# Patient Record
Sex: Male | Born: 1945
Health system: Southern US, Community
[De-identification: ages and names within clinical notes are randomized; demographics above are authoritative.]

## PROBLEM LIST (undated history)

## (undated) DIAGNOSIS — F419 Anxiety disorder, unspecified: Secondary | ICD-10-CM

## (undated) DIAGNOSIS — I10 Essential (primary) hypertension: Secondary | ICD-10-CM

## (undated) DIAGNOSIS — K279 Peptic ulcer, site unspecified, unspecified as acute or chronic, without hemorrhage or perforation: Secondary | ICD-10-CM

## (undated) DIAGNOSIS — J449 Chronic obstructive pulmonary disease, unspecified: Secondary | ICD-10-CM

## (undated) DIAGNOSIS — E785 Hyperlipidemia, unspecified: Secondary | ICD-10-CM

## (undated) DIAGNOSIS — R569 Unspecified convulsions: Secondary | ICD-10-CM

## (undated) DIAGNOSIS — I639 Cerebral infarction, unspecified: Secondary | ICD-10-CM

## (undated) DIAGNOSIS — I6529 Occlusion and stenosis of unspecified carotid artery: Secondary | ICD-10-CM

## (undated) DIAGNOSIS — K219 Gastro-esophageal reflux disease without esophagitis: Secondary | ICD-10-CM

## (undated) DIAGNOSIS — G8929 Other chronic pain: Secondary | ICD-10-CM

## (undated) HISTORY — DX: Essential (primary) hypertension: I10

## (undated) HISTORY — DX: Occlusion and stenosis of unspecified carotid artery: I65.29

## (undated) HISTORY — DX: Hyperlipidemia, unspecified: E78.5

## (undated) HISTORY — PX: UPPER GASTROINTESTINAL ENDOSCOPY: SHX188

## (undated) HISTORY — PX: COLONOSCOPY: SHX174

## (undated) HISTORY — DX: Other chronic pain: G89.29

## (undated) HISTORY — DX: Unspecified convulsions: R56.9

## (undated) HISTORY — PX: LUMBAR DISC SURGERY: SHX700

## (undated) HISTORY — DX: Anxiety disorder, unspecified: F41.9

## (undated) HISTORY — DX: Peptic ulcer, site unspecified, unspecified as acute or chronic, without hemorrhage or perforation: K27.9

## (undated) HISTORY — PX: BACK SURGERY: SHX140

---

## 2000-07-03 ENCOUNTER — Encounter: Payer: Self-pay | Admitting: Internal Medicine

## 2000-07-03 ENCOUNTER — Encounter: Admission: RE | Admit: 2000-07-03 | Discharge: 2000-07-03 | Payer: Self-pay | Admitting: Internal Medicine

## 2004-03-05 ENCOUNTER — Ambulatory Visit (HOSPITAL_COMMUNITY): Admission: RE | Admit: 2004-03-05 | Discharge: 2004-03-05 | Payer: Self-pay | Admitting: Orthopedic Surgery

## 2005-03-31 ENCOUNTER — Emergency Department (HOSPITAL_COMMUNITY): Admission: EM | Admit: 2005-03-31 | Discharge: 2005-03-31 | Payer: Self-pay | Admitting: Emergency Medicine

## 2010-02-12 ENCOUNTER — Encounter: Payer: Self-pay | Admitting: Internal Medicine

## 2013-05-18 ENCOUNTER — Ambulatory Visit
Admission: RE | Admit: 2013-05-18 | Discharge: 2013-05-18 | Disposition: A | Discharge status: Home / Self Care | Payer: Medicare Other | Source: Ambulatory Visit | Attending: Internal Medicine | Admitting: Internal Medicine

## 2013-05-18 ENCOUNTER — Other Ambulatory Visit: Payer: Self-pay | Admitting: Internal Medicine

## 2013-05-18 DIAGNOSIS — R059 Cough, unspecified: Secondary | ICD-10-CM

## 2013-05-18 DIAGNOSIS — R05 Cough: Secondary | ICD-10-CM

## 2019-05-19 ENCOUNTER — Ambulatory Visit (INDEPENDENT_AMBULATORY_CARE_PROVIDER_SITE_OTHER): Payer: Medicare HMO | Admitting: Family Medicine

## 2019-05-19 ENCOUNTER — Encounter: Payer: Self-pay | Admitting: Family Medicine

## 2019-05-19 ENCOUNTER — Other Ambulatory Visit: Payer: Self-pay

## 2019-05-19 VITALS — BP 113/64 | HR 86 | Temp 97.8°F | Ht 69.0 in | Wt 187.6 lb

## 2019-05-19 DIAGNOSIS — F410 Panic disorder [episodic paroxysmal anxiety] without agoraphobia: Secondary | ICD-10-CM

## 2019-05-19 DIAGNOSIS — R350 Frequency of micturition: Secondary | ICD-10-CM | POA: Diagnosis not present

## 2019-05-19 DIAGNOSIS — Z720 Tobacco use: Secondary | ICD-10-CM

## 2019-05-19 DIAGNOSIS — F5104 Psychophysiologic insomnia: Secondary | ICD-10-CM

## 2019-05-19 DIAGNOSIS — F411 Generalized anxiety disorder: Secondary | ICD-10-CM | POA: Diagnosis not present

## 2019-05-19 DIAGNOSIS — Z7689 Persons encountering health services in other specified circumstances: Secondary | ICD-10-CM

## 2019-05-19 DIAGNOSIS — Z79899 Other long term (current) drug therapy: Secondary | ICD-10-CM | POA: Diagnosis not present

## 2019-05-19 LAB — URINALYSIS
Bilirubin, UA: NEGATIVE
Glucose, UA: NEGATIVE
Ketones, UA: NEGATIVE
Leukocytes,UA: NEGATIVE
Nitrite, UA: NEGATIVE
Protein,UA: NEGATIVE
RBC, UA: NEGATIVE
Specific Gravity, UA: 1.015 (ref 1.005–1.030)
Urobilinogen, Ur: 0.2 mg/dL (ref 0.2–1.0)
pH, UA: 6.5 (ref 5.0–7.5)

## 2019-05-19 MED ORDER — MIRTAZAPINE 7.5 MG PO TABS: 7.5000 mg | ORAL_TABLET | Freq: Every day | ORAL | 0 refills | Status: DC

## 2019-05-19 NOTE — Progress Notes (Signed)
Subjective: JS:[id number] care, generalized anxiety disorder, tobacco use disorder, hypertension, hyperlipidemia HPI: Eric Cook is a 74 y.o. male presenting to clinic today for:  1.  Hypertension with hyperlipidemia Patient reports compliance with simvastatin 20 mg daily, lisinopril 10-25 mg daily.  He has had some right-sided rib pain but he notes that this has been ongoing for only about a week and this occurred after he slipped and fell onto the corner of something at home.  Pain is only present when he is at rest and is improved with ambulation.  No hemoptysis.  He is a daily smoker and smokes anywhere from 1/2 to 3/4 pack/day and has done so for 30 years.  Denies any unplanned weight loss.  No change in exercise tolerance.  He notes he is very physically active and "has to work".  He was on Chantix several months ago which did help him quit successfully and he was off of cigarettes for two whole weeks.  Unfortunately was unable to get a refill on the medication.  He would be willing to go back on this.  2.  Generalized anxiety disorder Patient reports longstanding history of generalized anxiety disorder.  He notes that he is only ever been treated with Xanax, which she takes 1 mg 3 times daily of.  He denies any excessive daytime sedation, mental status changes including memory loss, falls, alcohol use.  3.  Urinary frequency Patient reports nocturia anywhere between three and four times per night.  He denies any hematuria.  Again no unplanned weight loss or night sweats.  He has had a yearly prostate exam/DRE but has never had a PSA per his report.  He would like to have this checked if possible.  Denies any dysuria, nausea or vomiting.  Past Medical History:  Diagnosis Date  . Anxiety   . Hyperlipidemia   . Hypertension    Past Surgical History:  Procedure Laterality Date  . BACK SURGERY     disc removal   Social History   Socioeconomic History  . Marital status: Single     Spouse name: Not on file  . Number of children: Not on file  . Years of education: Not on file  . Highest education level: Not on file  Occupational History  . Occupation: Film/video editor    Comment: part time  Tobacco Use  . Smoking status: Current Every Day Smoker    Packs/day: 0.50    Years: 30.00    Pack years: 15.00  . Smokeless tobacco: Never Used  Substance and Sexual Activity  . Alcohol use: Never  . Drug use: Never  . Sexual activity: Never  Other Topics Concern  . Not on file  Social History Narrative   Works on    Investment banker, operational of Radio broadcast assistant Strain:   . Difficulty of Paying Living Expenses:   Food Insecurity:   . Worried About Charity fundraiser in the Last Year:   . Arboriculturist in the Last Year:   Transportation Needs:   . Film/video editor (Medical):   Marland Kitchen Lack of Transportation (Non-Medical):   Physical Activity:   . Days of Exercise per Week:   . Minutes of Exercise per Session:   Stress:   . Feeling of Stress :   Social Connections:   . Frequency of Communication with Friends and Family:   . Frequency of Social Gatherings with Friends and Family:   . Attends Religious Services:   .  Active Member of Clubs or Organizations:   . Attends Archivist Meetings:   Marland Kitchen Marital Status:   Intimate Partner Violence:   . Fear of Current or Ex-Partner:   . Emotionally Abused:   Marland Kitchen Physically Abused:   . Sexually Abused:    Current Meds  Medication Sig  . ALPRAZolam (XANAX) 1 MG tablet Take 1 mg by mouth 3 (three) times daily as needed.   . cimetidine (TAGAMET) 400 MG tablet Take 400 mg by mouth 2 (two) times daily.   Marland Kitchen lisinopril-hydrochlorothiazide (ZESTORETIC) 10-12.5 MG tablet Take 1 tablet by mouth daily.  . simvastatin (ZOCOR) 20 MG tablet Take 20 mg by mouth daily at 6 PM.    History reviewed. No pertinent family history. No Known Allergies   ROS: Per HPI  Objective: Office vital signs reviewed. BP  113/64   Pulse 86   Temp 97.8 F (36.6 C)   Ht '5\' 9"'$  (1.753 m)   Wt 187 lb 9.6 oz (85.1 kg)   SpO2 95%   BMI 27.70 kg/m   Physical Examination:  General: Awake, alert, No acute distress HEENT: Normal    Eyes: PERRLA, extraocular movement in tact, sclera white Cardio: regular rate and rhythm, S1S2 heard, no murmurs appreciated Pulm: expiratory rhonchi noted R>L.  Normal air movement.  normal work of breathing on room air Extremities: warm, well perfused, No edema, cyanosis or clubbing; +2 pulses bilaterally MSK: normal gait and station Skin: dry; intact; no rashes or lesions Neuro: no tremor.  Patient's speech seems slightly slurred Psych: mood stable. Patient behavior somewhat unusual (sporadically inappropriate during conversation, talking about "having a lot of sex", etc) Depression screen [facility] 2/9 05/19/2019  Decreased Interest 0  Down, Depressed, Hopeless 0  PHQ - 2 Score 0   GAD 7 : Generalized Anxiety Score 05/19/2019  Nervous, Anxious, on Edge 1  Control/stop worrying 3  Worry too much - different things 3  Trouble relaxing 3  Restless 3  Easily annoyed or irritable 3  Afraid - awful might happen 3  Total GAD 7 Score 19  Anxiety Difficulty Not difficult at all    Assessment/ Plan: 74 y.o. male   1. Long term prescription benzodiazepine use Does not need refills.  I will plan to taper him off of this medication as his behavior was somewhat unusual.  I have starting mirtazapine as below. - ToxASSURE Select 13 (MW), Urine  2. Establishing care with new doctor, encounter for  3. Generalized anxiety disorder with panic attacks Not controlled despite 3 times daily use of benzodiazepine.  Start mirtazapine.  Follow-up in 4 weeks - ToxASSURE Select 13 (MW), Urine - mirtazapine (REMERON) 7.5 MG tablet; Take 1 tablet (7.5 mg total) by mouth at bedtime.  Dispense: 90 tablet; Refill: 0  4. Urinary frequency ?  Prostate issue.  Plan for Flomax or similar pending labs -  CMP14+EGFR - PSA - CBC - Urinalysis  5. Psychophysiological insomnia - CMP14+EGFR - mirtazapine (REMERON) 7.5 MG tablet; Take 1 tablet (7.5 mg total) by mouth at bedtime.  Dispense: 90 tablet; Refill: 0  6. Tobacco use Advised for cessation.  We will plan for Chantix pending labs.   Janora Norlander, DO [place] [phone]

## 2019-05-20 ENCOUNTER — Other Ambulatory Visit: Payer: Self-pay | Admitting: Family Medicine

## 2019-05-20 DIAGNOSIS — R350 Frequency of micturition: Secondary | ICD-10-CM

## 2019-05-20 DIAGNOSIS — Z720 Tobacco use: Secondary | ICD-10-CM

## 2019-05-20 LAB — CBC
Hematocrit: 46.3 % (ref 37.5–51.0)
Hemoglobin: 15.7 g/dL (ref 13.0–17.7)
MCH: 33.1 pg — ABNORMAL HIGH (ref 26.6–33.0)
MCHC: 33.9 g/dL (ref 31.5–35.7)
MCV: 98 fL — ABNORMAL HIGH (ref 79–97)
Platelets: 172 10*3/uL (ref 150–450)
RBC: 4.75 x10E6/uL (ref 4.14–5.80)
RDW: 12.6 % (ref 11.6–15.4)
WBC: 5.8 10*3/uL (ref 3.4–10.8)

## 2019-05-20 LAB — CMP14+EGFR
ALT: 22 IU/L (ref 0–44)
AST: 29 IU/L (ref 0–40)
Albumin/Globulin Ratio: 2.2 (ref 1.2–2.2)
Albumin: 5.2 g/dL — ABNORMAL HIGH (ref 3.7–4.7)
Alkaline Phosphatase: 98 IU/L (ref 39–117)
BUN/Creatinine Ratio: 13 (ref 10–24)
BUN: 13 mg/dL (ref 8–27)
Bilirubin Total: 1 mg/dL (ref 0.0–1.2)
CO2: 25 mmol/L (ref 20–29)
Calcium: 10.1 mg/dL (ref 8.6–10.2)
Chloride: 97 mmol/L (ref 96–106)
Creatinine, Ser: 1.04 mg/dL (ref 0.76–1.27)
GFR calc Af Amer: 82 mL/min/{1.73_m2} (ref >59)
GFR calc non Af Amer: 71 mL/min/{1.73_m2} (ref >59)
Globulin, Total: 2.4 g/dL (ref 1.5–4.5)
Glucose: 86 mg/dL (ref 65–99)
Potassium: 3.6 mmol/L (ref 3.5–5.2)
Sodium: 139 mmol/L (ref 134–144)
Total Protein: 7.6 g/dL (ref 6.0–8.5)

## 2019-05-20 LAB — PSA: Prostate Specific Ag, Serum: 0.2 ng/mL (ref 0.0–4.0)

## 2019-05-20 MED ORDER — TAMSULOSIN HCL 0.4 MG PO CAPS
0.4000 mg | ORAL_CAPSULE | Freq: Every day | ORAL | 0 refills | Status: DC
Start: 2019-05-20 — End: 2019-06-11

## 2019-05-20 MED ORDER — CHANTIX STARTING MONTH PAK 0.5 MG X 11 & 1 MG X 42 PO TABS: ORAL_TABLET | ORAL | 0 refills | Status: DC

## 2019-05-21 ENCOUNTER — Other Ambulatory Visit: Payer: Self-pay | Admitting: Family Medicine

## 2019-05-21 LAB — TOXASSURE SELECT 13 (MW), URINE

## 2019-05-21 MED ORDER — VARENICLINE TARTRATE 1 MG PO TABS: 1.0000 mg | ORAL_TABLET | Freq: Two times a day (BID) | ORAL | 0 refills | Status: DC

## 2019-05-25 ENCOUNTER — Telehealth: Payer: Self-pay | Admitting: Family Medicine

## 2019-05-25 ENCOUNTER — Other Ambulatory Visit: Payer: Self-pay | Admitting: Family Medicine

## 2019-05-25 DIAGNOSIS — I10 Essential (primary) hypertension: Secondary | ICD-10-CM

## 2019-05-25 DIAGNOSIS — E782 Mixed hyperlipidemia: Secondary | ICD-10-CM

## 2019-05-25 MED ORDER — SIMVASTATIN 20 MG PO TABS: 20.0000 mg | ORAL_TABLET | Freq: Every day | ORAL | 3 refills | Status: DC

## 2019-05-25 MED ORDER — LISINOPRIL-HYDROCHLOROTHIAZIDE 10-12.5 MG PO TABS: 1.0000 | ORAL_TABLET | Freq: Every day | ORAL | 3 refills | Status: DC

## 2019-05-25 NOTE — Telephone Encounter (Signed)
Patient is requesting a refill of lisinopril HCTZ and simvastatin be sent to [facility].  Please advise - you have never filled either medication before.

## 2019-06-10 ENCOUNTER — Other Ambulatory Visit: Payer: Self-pay | Admitting: Family Medicine

## 2019-06-23 ENCOUNTER — Encounter: Payer: Self-pay | Admitting: Family Medicine

## 2019-06-23 ENCOUNTER — Other Ambulatory Visit: Payer: Self-pay

## 2019-06-23 ENCOUNTER — Ambulatory Visit (INDEPENDENT_AMBULATORY_CARE_PROVIDER_SITE_OTHER): Payer: Medicare HMO | Admitting: Family Medicine

## 2019-06-23 VITALS — BP 138/84 | HR 76 | Temp 97.2°F | Ht 69.0 in | Wt 191.0 lb

## 2019-06-23 DIAGNOSIS — F411 Generalized anxiety disorder: Secondary | ICD-10-CM

## 2019-06-23 DIAGNOSIS — Z79899 Other long term (current) drug therapy: Secondary | ICD-10-CM

## 2019-06-23 DIAGNOSIS — F410 Panic disorder [episodic paroxysmal anxiety] without agoraphobia: Secondary | ICD-10-CM

## 2019-06-23 MED ORDER — ALPRAZOLAM 1 MG PO TABS: ORAL_TABLET | ORAL | 0 refills | Status: DC

## 2019-06-23 MED ORDER — ESCITALOPRAM OXALATE 10 MG PO TABS: 10.0000 mg | ORAL_TABLET | Freq: Every day | ORAL | 5 refills | Status: DC

## 2019-06-23 NOTE — Patient Instructions (Signed)
Benzodiazepine Withdrawal Benzodiazepines are prescription medicines that decrease the activity of (depress) the central nervous system and cause changes in certain brain chemicals (neurotransmitters). There are many types of benzodiazepines. Some benzodiazepines take effect quickly and stay in your body for a short amount of time (short-acting). Other benzodiazepines require more time to take effect and stay in your body for longer amounts of time (long-acting). The five most commonly prescribed benzodiazepines are:  Alprazolam.  Lorazepam.  Clonazepam.  Diazepam.  Temazepam. Withdrawal is a group of physical and mental symptoms that can happen when you suddenly stop taking a medicine. What are the causes? When you take benzodiazepines, your brain needs more and more of the medicine over time to get the same effects from it. This increased need is called tolerance. As you develop a tolerance, your brain adapts to the effects of the benzodiazepine and relies on these effects. This is called dependency. Withdrawal happens when you suddenly stop taking your medicine. This does not give your brain enough time to adapt to not having the medicine. What increases the risk? You are more likely to develop this condition if you:  Have taken benzodiazepines for more than 1-2 weeks.  Have developed a tolerance for benzodiazepines.  Have developed a dependence on benzodiazepines.  Take high dosages of benzodiazepines.  Take doses of benzodiazepines that are higher than prescribed.  Take benzodiazepines without a prescription.  Use benzodiazepines with other substances that depress the central nervous system, such as alcohol.  Have a history of drug or alcohol abuse. What are the signs or symptoms? Symptoms of this condition may include:  Difficulty sleeping.  Anxiety.  Restlessness.  Irritability.  Muscle aches.  Involuntary shaking or trembling of a body part (tremor).  Confusion  and poor concentration.  Vomiting.  Sweating.  Headaches.  Feeling or seeing things that are not there (hallucinations).  Seizures. Symptoms of withdrawal from short-acting benzodiazepines may develop 1-2 days after you stop taking your medicine, and they may last for 2-4 weeks or longer. Symptoms of withdrawal from long-acting benzodiazepines may develop 2-7 days after you stop taking your medicine, and they may last for 2-8 weeks or longer. How is this diagnosed? This condition may be diagnosed based on:  Your symptoms.  A physical exam. Your health care provider may check for: ? Rapid heartbeat. ? Rapid breathing. ? Tremors. ? High blood pressure.  Blood tests.  Urine tests.  Your alcohol and drug habits.  Your medical history. How is this treated? Treatment usually involves starting you on a safe and stable dose of a benzodiazepine and then slowly lowering your dosage over time (tapered withdrawal). This may be done at a hospital or a treatment center. Treatment for this condition depends on:  Your symptoms.  The type of benzodiazepine you have been taking.  How long you have been taking benzodiazepines. Long-term treatment for this condition may involve medicine, counseling, and support groups. Follow these instructions at home:   Take over-the-counter and prescription medicines only as told by your health care provider.  Check with your health care provider before starting new medicines.  Keep all follow-up visits as told by your health care provider. This is important. How is this prevented?  Do not take any benzodiazepines without a prescription.  Do not take more than your prescribed dosage.  Do not mix benzodiazepines with alcohol or other medicines.  Do not stop taking benzodiazepines without speaking with your health care provider. Contact a health care provider if you:    Are not able to take your medicines as told by your health care  provider.  Have symptoms that get worse.  Develop withdrawal symptoms during your tapered withdrawal.  Develop a craving for drugs or alcohol.  Experience withdrawal again (relapse). Get help right away if you:  Have a seizure.  Become very confused.  Lose consciousness.  Have difficulty breathing.  Have serious thoughts about hurting yourself or someone else. If you ever feel like you may hurt yourself or others, or have thoughts about taking your own life, get help right away. You can go to your nearest emergency department or call:  Your local emergency services (911 in the [place]  A suicide crisis helpline, such as the National Suicide Prevention Lifeline at 1-800-273-8255. This is open 24 hours a day. Summary  Withdrawal is a group of physical and mental symptoms that can happen when you suddenly stop taking a benzodiazepine.  Treatment usually involves starting on a safe and stable dose of a benzodiazepine and then slowly lowering the dosage over time.  The type of treatment depends on your symptoms, how long you were taking the medicine, and which type of medicine you were taking.  Long-term treatment may include medicine, counseling, and support groups. This information is not intended to replace advice given to you by your health care provider. Make sure you discuss any questions you have with your health care provider. Document Revised: 10/22/2017 Document Reviewed: 09/25/2017 Elsevier Patient Education  2020 Elsevier Inc.  

## 2019-06-23 NOTE — Progress Notes (Signed)
Subjective: CC: GAD/ panic HPI: Eric Cook is a 74 y.o. male presenting to clinic today for:  1.  Generalized anxiety disorder Patient reports longstanding history of generalized anxiety disorder.    Up until about 1 week ago, he was using the Xanax 1mg  TID.  He ran out.  He has been more anxious, has had worsened difficulty with insomnia.  No tremor, seizure.  He did not tolerate the Mirtazapine 2/2 vivid/ "crazy" dreams.  Only took about 3 doses before stopping.  Of not, he also stopped smoking with Chantix recently.  Past Medical History:  Diagnosis Date  . Anxiety   . Hyperlipidemia   . Hypertension    Past Surgical History:  Procedure Laterality Date  . BACK SURGERY     disc removal   Social History   Socioeconomic History  . Marital status: Single    Spouse name: Not on file  . Number of children: Not on file  . Years of education: Not on file  . Highest education level: Not on file  Occupational History  . Occupation:    Comment: part time  Tobacco Use  . Smoking status: Current Every Day Smoker    Packs/day: 0.50    Years: 30.00    Pack years: 15.00  . Smokeless tobacco: Never Used  Substance and Sexual Activity  . Alcohol use: Never  . Drug use: Never  . Sexual activity: Never  Other Topics Concern  . Not on file  Social History Narrative   Works on    Lobbyist of International aid/development worker Strain:   . Difficulty of Paying Living Expenses:   Food Insecurity:   . Worried About Corporate investment banker in the Last Year:   . Programme researcher, broadcasting/film/video in the Last Year:   Transportation Needs:   . Barista (Medical):   Freight forwarder Lack of Transportation (Non-Medical):   Physical Activity:   . Days of Exercise per Week:   . Minutes of Exercise per Session:   Stress:   . Feeling of Stress :   Social Connections:   . Frequency of Communication with Friends and Family:   . Frequency of Social Gatherings with Friends  and Family:   . Attends Religious Services:   . Active Member of Clubs or Organizations:   . Attends Marland Kitchen Meetings:   Banker Marital Status:   Intimate Partner Violence:   . Fear of Current or Ex-Partner:   . Emotionally Abused:   Marland Kitchen Physically Abused:   . Sexually Abused:    Current Meds  Medication Sig  . ALPRAZolam (XANAX) 1 MG tablet Take 1 mg by mouth 3 (three) times daily as needed.   . cimetidine (TAGAMET) 400 MG tablet Take 400 mg by mouth 2 (two) times daily.   Marland Kitchen lisinopril-hydrochlorothiazide (ZESTORETIC) 10-12.5 MG tablet Take 1 tablet by mouth daily.  . simvastatin (ZOCOR) 20 MG tablet Take 1 tablet (20 mg total) by mouth daily at 6 PM.  . tamsulosin (FLOMAX) 0.4 MG CAPS capsule TAKE 1 CAPSULE EVERY DAY FOR URINARY SYMPTOMS   No family history on file. No Known Allergies   ROS: Per HPI  Objective: Office vital signs reviewed. BP 138/84   Pulse 76   Temp (!) 97.2 F (36.2 C) (Temporal)   Ht 5\' 9"  (1.753 m)   Wt 191 lb (86.6 kg)   SpO2 96%   BMI 28.21 kg/m   Physical Examination:  General: Awake, alert, No acute distress HEENT: Normal    Eyes: PERRLA, extraocular movement in tact, sclera white Cardio: regular rate and rhythm, S1S2 heard, no murmurs appreciated Pulm: expiratory rhonchi noted R>L.  Normal air movement.  normal work of breathing on room air Extremities: warm, well perfused, No edema, cyanosis or clubbing; +2 pulses bilaterally MSK: normal gait and station Skin: dry; intact; no rashes or lesions Neuro: no tremor.  Patient's speech seems slightly slurred Psych: mood stable. Patient behavior somewhat unusual (sporadically inappropriate during conversation, talking about "having a lot of sex", etc) Depression screen [facility] 2/9 06/23/2019 05/19/2019  Decreased Interest 0 0  Down, Depressed, Hopeless 0 0  PHQ - 2 Score 0 0  Altered sleeping 3 -  Tired, decreased energy 0 -  Change in appetite 0 -  Feeling bad or failure about yourself  0 -    Trouble concentrating 0 -  Moving slowly or fidgety/restless 0 -  Suicidal thoughts 0 -  PHQ-9 Score 3 -  Difficult doing work/chores Not difficult at all -   GAD 7 : Generalized Anxiety Score 06/23/2019 05/19/2019  Nervous, Anxious, on Edge 3 1  Control/stop worrying 1 3  Worry too much - different things 1 3  Trouble relaxing 3 3  Restless 3 3  Easily annoyed or irritable 1 3  Afraid - awful might happen 2 3  Total GAD 7 Score 14 19  Anxiety Difficulty Very difficult Not difficult at all    Assessment/ Plan: 74 y.o. male   1. Generalized anxiety disorder with panic attacks Not controlled.  Likely multifactorial, including abrupt discontinuation of Xanax.  We discussed step down therapy but I reinforced he should NEVER abruptly discontinue.  Lexapro 10mg  sent.  Follow up in 4 weeks. The Narcotic Database has been reviewed.  There were no red flags.   - ALPRAZolam (XANAX) 1 MG tablet; Take 1 mg every morning, 0.5mg  every afternoon and 1 mg every evening as needed for panic/ anxiety  Dispense: 75 tablet; Refill: 0  2. Long term prescription benzodiazepine use - ALPRAZolam (XANAX) 1 MG tablet; Take 1 mg every morning, 0.5mg  every afternoon and 1 mg every evening as needed for panic/ anxiety  Dispense: 75 tablet; Refill: 0  Meds ordered this encounter  Medications  . ALPRAZolam (XANAX) 1 MG tablet    Sig: Take 1 mg every morning, 0.5mg  every afternoon and 1 mg every evening as needed for panic/ anxiety    Dispense:  75 tablet    Refill:  0  . escitalopram (LEXAPRO) 10 MG tablet    Sig: Take 1 tablet (10 mg total) by mouth daily.    Dispense:  30 tablet    Refill:  [place], [place] [phone]

## 2019-07-31 ENCOUNTER — Encounter: Payer: Self-pay | Admitting: Family Medicine

## 2019-07-31 ENCOUNTER — Other Ambulatory Visit: Payer: Self-pay

## 2019-07-31 ENCOUNTER — Ambulatory Visit (INDEPENDENT_AMBULATORY_CARE_PROVIDER_SITE_OTHER): Payer: Medicare HMO | Admitting: Family Medicine

## 2019-07-31 VITALS — BP 129/73 | HR 103 | Temp 97.2°F | Ht 69.0 in | Wt 188.0 lb

## 2019-07-31 DIAGNOSIS — Z79899 Other long term (current) drug therapy: Secondary | ICD-10-CM | POA: Diagnosis not present

## 2019-07-31 DIAGNOSIS — F411 Generalized anxiety disorder: Secondary | ICD-10-CM

## 2019-07-31 DIAGNOSIS — F410 Panic disorder [episodic paroxysmal anxiety] without agoraphobia: Secondary | ICD-10-CM | POA: Diagnosis not present

## 2019-07-31 MED ORDER — ALPRAZOLAM 1 MG PO TABS: ORAL_TABLET | ORAL | 0 refills | Status: DC

## 2019-07-31 MED ORDER — TAMSULOSIN HCL 0.4 MG PO CAPS: ORAL_CAPSULE | ORAL | 3 refills | Status: DC

## 2019-07-31 MED ORDER — ESCITALOPRAM OXALATE 10 MG PO TABS: 10.0000 mg | ORAL_TABLET | Freq: Every day | ORAL | 3 refills | Status: DC

## 2019-07-31 NOTE — Patient Instructions (Signed)
Pick up the citalopram from [facility] so you do not withdraw from the medication.  I have decreased your alprazolam (Xanax) to 1 tablet twice daily x1 month  Then go down to 1/2 tablet each morning and continue 1 tablet each evening x1 month.    Then you can go down to 1/2 tablet twice daily.

## 2019-07-31 NOTE — Progress Notes (Signed)
Subjective: CC: GAD/ panic HPI: Eric Cook is a 74 y.o. male presenting to clinic today for:  1.  Generalized anxiety disorder Patient has been doing well on Lexapro 10 mg daily.  He continues to use the alprazolam 3 times daily but tolerating the reduction in the middle of the day without difficulty.  He would like to go ahead and reduce further to ultimately get off of some of the medication he is on.  Does not report any nightmares, tremor.  He has had some intermittent diarrhea but this is not occurring more than once per week.  Past Medical History:  Diagnosis Date   Anxiety    Hyperlipidemia    Hypertension    Past Surgical History:  Procedure Laterality Date   BACK SURGERY     disc removal   Social History   Socioeconomic History   Marital status: Single    Spouse name: Not on file   Number of children: Not on file   Years of education: Not on file   Highest education level: Not on file  Occupational History   Occupation: Lobbyist    Comment: part time  Tobacco Use   Smoking status: Current Every Day Smoker    Packs/day: 0.50    Years: 30.00    Pack years: 15.00   Smokeless tobacco: Never Used  Vaping Use   Vaping Use: Never used  Substance and Sexual Activity   Alcohol use: Never   Drug use: Never   Sexual activity: Never  Other Topics Concern   Not on file  Social History Narrative   Works on    International aid/development worker of Corporate investment banker Strain:    Difficulty of Paying Living Expenses:   Food Insecurity:    Worried About Programme researcher, broadcasting/film/video in the Last Year:    Barista in the Last Year:   Transportation Needs:    Freight forwarder (Medical):    Lack of Transportation (Non-Medical):   Physical Activity:    Days of Exercise per Week:    Minutes of Exercise per Session:   Stress:    Feeling of Stress :   Social Connections:    Frequency of Communication with Friends and Family:      Frequency of Social Gatherings with Friends and Family:    Attends Religious Services:    Active Member of Clubs or Organizations:    Attends Engineer, structural:    Marital Status:   Intimate Partner Violence:    Fear of Current or Ex-Partner:    Emotionally Abused:    Physically Abused:    Sexually Abused:    Current Meds  Medication Sig   ALPRAZolam (XANAX) 1 MG tablet Take 1 mg every morning, 0.5mg  every afternoon and 1 mg every evening as needed for panic/ anxiety   cimetidine (TAGAMET) 400 MG tablet Take 400 mg by mouth 2 (two) times daily.    escitalopram (LEXAPRO) 10 MG tablet Take 1 tablet (10 mg total) by mouth daily.   lisinopril-hydrochlorothiazide (ZESTORETIC) 10-12.5 MG tablet Take 1 tablet by mouth daily.   simvastatin (ZOCOR) 20 MG tablet Take 1 tablet (20 mg total) by mouth daily at 6 PM.   tamsulosin (FLOMAX) 0.4 MG CAPS capsule TAKE 1 CAPSULE EVERY DAY FOR URINARY SYMPTOMS   History reviewed. No pertinent family history. No Known Allergies   ROS: Per HPI  Objective: Office vital signs reviewed. BP 129/73  Pulse (!) 103    Temp (!) 97.2 F (36.2 C)    Ht 5\' 9"  (1.753 m)    Wt 188 lb (85.3 kg)    SpO2 99%    BMI 27.76 kg/m   Physical Examination:  General: Awake, alert, No acute distress Pulmonary: normal work of breathing on room air Psych: mood stable. Speech slow but understandable. Good eye contact. Depression screen [facility] 2/9 07/31/2019 07/31/2019 06/23/2019  Decreased Interest 0 0 0  Down, Depressed, Hopeless 0 0 0  PHQ - 2 Score 0 0 0  Altered sleeping 1 - 3  Tired, decreased energy 1 - 0  Change in appetite 0 - 0  Feeling bad or failure about yourself  0 - 0  Trouble concentrating 0 - 0  Moving slowly or fidgety/restless 0 - 0  Suicidal thoughts 0 - 0  PHQ-9 Score 2 - 3  Difficult doing work/chores - - Not difficult at all   GAD 7 : Generalized Anxiety Score 07/31/2019 06/23/2019 05/19/2019  Nervous, Anxious, on Edge 1 3 1    Control/stop worrying 0 1 3  Worry too much - different things 0 1 3  Trouble relaxing 0 3 3  Restless 1 3 3   Easily annoyed or irritable 0 1 3  Afraid - awful might happen 0 2 3  Total GAD 7 Score 2 14 19   Anxiety Difficulty - Very difficult Not difficult at all    Assessment/ Plan: 74 y.o. male   1. Generalized anxiety disorder with panic attacks Stable and improving compared to last previous visits.  He has tolerated the reduction without difficulty and wants to push further reduction to ultimately come off of the alprazolam.  I have reduced his dose to 1 mg twice daily as needed.  He could continue that for 1 month before next half tablet reduction.  Continue Lexapro 10 mg daily. The Narcotic Database has been reviewed.  There were no red flags.   - ALPRAZolam (XANAX) 1 MG tablet; Take 1 tablet twice daily if needed for anxiety for 1 month.  Then go down to 1/2 tablet in the morning and 1 tablet at bedtime if needed x1 month.  Then go down to 1/2 tablet twice daily.  Dispense: 135 tablet; Refill: 0  2. Long term prescription benzodiazepine use - ALPRAZolam (XANAX) 1 MG tablet; Take 1 tablet twice daily if needed for anxiety for 1 month.  Then go down to 1/2 tablet in the morning and 1 tablet at bedtime if needed x1 month.  Then go down to 1/2 tablet twice daily.  Dispense: 135 tablet; Refill: 0   Meds ordered this encounter  Medications   escitalopram (LEXAPRO) 10 MG tablet    Sig: Take 1 tablet (10 mg total) by mouth daily.    Dispense:  90 tablet    Refill:  3   tamsulosin (FLOMAX) 0.4 MG CAPS capsule    Sig: TAKE 1 CAPSULE EVERY DAY FOR URINARY SYMPTOMS    Dispense:  90 capsule    Refill:  3   ALPRAZolam (XANAX) 1 MG tablet    Sig: Take 1 tablet twice daily if needed for anxiety for 1 month.  Then go down to 1/2 tablet in the morning and 1 tablet at bedtime if needed x1 month.  Then go down to 1/2 tablet twice daily.    Dispense:  135 tablet    Refill:  0    Eric Cook , DO [facility] [phone]

## 2019-08-23 ENCOUNTER — Other Ambulatory Visit: Payer: Self-pay

## 2019-08-23 ENCOUNTER — Encounter (HOSPITAL_COMMUNITY): Payer: Self-pay | Admitting: *Deleted

## 2019-08-23 ENCOUNTER — Observation Stay (HOSPITAL_COMMUNITY)
Admission: EM | Admit: 2019-08-23 | Discharge: 2019-08-25 | Disposition: A | Discharge status: Home / Self Care | Payer: Medicare HMO | Attending: Internal Medicine | Admitting: Internal Medicine

## 2019-08-23 ENCOUNTER — Emergency Department (HOSPITAL_COMMUNITY): Payer: Medicare HMO

## 2019-08-23 ENCOUNTER — Observation Stay (HOSPITAL_COMMUNITY): Payer: Medicare HMO

## 2019-08-23 DIAGNOSIS — F17210 Nicotine dependence, cigarettes, uncomplicated: Secondary | ICD-10-CM | POA: Diagnosis not present

## 2019-08-23 DIAGNOSIS — R410 Disorientation, unspecified: Secondary | ICD-10-CM | POA: Diagnosis not present

## 2019-08-23 DIAGNOSIS — Z20822 Contact with and (suspected) exposure to covid-19: Secondary | ICD-10-CM | POA: Insufficient documentation

## 2019-08-23 DIAGNOSIS — G319 Degenerative disease of nervous system, unspecified: Secondary | ICD-10-CM | POA: Diagnosis not present

## 2019-08-23 DIAGNOSIS — I6782 Cerebral ischemia: Secondary | ICD-10-CM | POA: Diagnosis not present

## 2019-08-23 DIAGNOSIS — Z79899 Other long term (current) drug therapy: Secondary | ICD-10-CM | POA: Diagnosis not present

## 2019-08-23 DIAGNOSIS — S32030A Wedge compression fracture of third lumbar vertebra, initial encounter for closed fracture: Principal | ICD-10-CM

## 2019-08-23 DIAGNOSIS — Y929 Unspecified place or not applicable: Secondary | ICD-10-CM | POA: Diagnosis not present

## 2019-08-23 DIAGNOSIS — S34103A Unspecified injury to L3 level of lumbar spinal cord, initial encounter: Secondary | ICD-10-CM | POA: Diagnosis present

## 2019-08-23 DIAGNOSIS — I7090 Unspecified atherosclerosis: Secondary | ICD-10-CM | POA: Diagnosis not present

## 2019-08-23 DIAGNOSIS — I10 Essential (primary) hypertension: Secondary | ICD-10-CM | POA: Diagnosis not present

## 2019-08-23 DIAGNOSIS — F411 Generalized anxiety disorder: Secondary | ICD-10-CM | POA: Diagnosis present

## 2019-08-23 DIAGNOSIS — J209 Acute bronchitis, unspecified: Secondary | ICD-10-CM | POA: Insufficient documentation

## 2019-08-23 DIAGNOSIS — Y939 Activity, unspecified: Secondary | ICD-10-CM | POA: Insufficient documentation

## 2019-08-23 DIAGNOSIS — G9389 Other specified disorders of brain: Secondary | ICD-10-CM | POA: Diagnosis not present

## 2019-08-23 DIAGNOSIS — R4182 Altered mental status, unspecified: Secondary | ICD-10-CM | POA: Diagnosis not present

## 2019-08-23 DIAGNOSIS — F05 Delirium due to known physiological condition: Secondary | ICD-10-CM | POA: Diagnosis present

## 2019-08-23 DIAGNOSIS — R0902 Hypoxemia: Secondary | ICD-10-CM | POA: Diagnosis not present

## 2019-08-23 DIAGNOSIS — G9340 Encephalopathy, unspecified: Secondary | ICD-10-CM | POA: Diagnosis not present

## 2019-08-23 DIAGNOSIS — N400 Enlarged prostate without lower urinary tract symptoms: Secondary | ICD-10-CM | POA: Diagnosis present

## 2019-08-23 DIAGNOSIS — Y999 Unspecified external cause status: Secondary | ICD-10-CM | POA: Insufficient documentation

## 2019-08-23 DIAGNOSIS — J811 Chronic pulmonary edema: Secondary | ICD-10-CM | POA: Diagnosis not present

## 2019-08-23 DIAGNOSIS — X58XXXA Exposure to other specified factors, initial encounter: Secondary | ICD-10-CM | POA: Diagnosis not present

## 2019-08-23 DIAGNOSIS — R404 Transient alteration of awareness: Secondary | ICD-10-CM | POA: Diagnosis not present

## 2019-08-23 DIAGNOSIS — R29810 Facial weakness: Secondary | ICD-10-CM | POA: Diagnosis not present

## 2019-08-23 DIAGNOSIS — G4489 Other headache syndrome: Secondary | ICD-10-CM | POA: Diagnosis not present

## 2019-08-23 DIAGNOSIS — M549 Dorsalgia, unspecified: Secondary | ICD-10-CM

## 2019-08-23 DIAGNOSIS — G9341 Metabolic encephalopathy: Secondary | ICD-10-CM

## 2019-08-23 LAB — URINALYSIS, ROUTINE W REFLEX MICROSCOPIC
Bacteria, UA: NONE SEEN
Bilirubin Urine: NEGATIVE
Glucose, UA: NEGATIVE mg/dL
Ketones, ur: 5 mg/dL — AB
Leukocytes,Ua: NEGATIVE
Nitrite: NEGATIVE
Protein, ur: NEGATIVE mg/dL
Specific Gravity, Urine: 1.012 (ref 1.005–1.030)
pH: 8 (ref 5.0–8.0)

## 2019-08-23 LAB — CBC
HCT: 42.8 % (ref 39.0–52.0)
Hemoglobin: 15.2 g/dL (ref 13.0–17.0)
MCH: 32.6 pg (ref 26.0–34.0)
MCHC: 35.5 g/dL (ref 30.0–36.0)
MCV: 91.8 fL (ref 80.0–100.0)
Platelets: 182 10*3/uL (ref 150–400)
RBC: 4.66 MIL/uL (ref 4.22–5.81)
RDW: 12.2 % (ref 11.5–15.5)
WBC: 7.7 10*3/uL (ref 4.0–10.5)
nRBC: 0 % (ref 0.0–0.2)

## 2019-08-23 LAB — COMPREHENSIVE METABOLIC PANEL
ALT: 19 U/L (ref 0–44)
AST: 24 U/L (ref 15–41)
Albumin: 5.1 g/dL — ABNORMAL HIGH (ref 3.5–5.0)
Alkaline Phosphatase: 89 U/L (ref 38–126)
Anion gap: 13 (ref 5–15)
BUN: 15 mg/dL (ref 8–23)
CO2: 24 mmol/L (ref 22–32)
Calcium: 9.7 mg/dL (ref 8.9–10.3)
Chloride: 97 mmol/L — ABNORMAL LOW (ref 98–111)
Creatinine, Ser: 0.97 mg/dL (ref 0.61–1.24)
GFR calc Af Amer: >60 mL/min (ref >60)
GFR calc non Af Amer: >60 mL/min (ref >60)
Glucose, Bld: 129 mg/dL — ABNORMAL HIGH (ref 70–99)
Potassium: 3.4 mmol/L — ABNORMAL LOW (ref 3.5–5.1)
Sodium: 134 mmol/L — ABNORMAL LOW (ref 135–145)
Total Bilirubin: 1.2 mg/dL (ref 0.3–1.2)
Total Protein: 7.9 g/dL (ref 6.5–8.1)

## 2019-08-23 LAB — BRAIN NATRIURETIC PEPTIDE: B Natriuretic Peptide: 129 pg/mL — ABNORMAL HIGH (ref 0.0–100.0)

## 2019-08-23 LAB — SARS CORONAVIRUS 2 BY RT PCR (HOSPITAL ORDER, PERFORMED IN ~~LOC~~ HOSPITAL LAB): SARS Coronavirus 2: NEGATIVE

## 2019-08-23 MED ORDER — FAMOTIDINE 20 MG PO TABS
20.0000 mg | ORAL_TABLET | Freq: Two times a day (BID) | ORAL | Status: DC
  Administered 2019-08-23 – 2019-08-25 (×3): 20 mg via ORAL
  Filled 2019-08-23 (×4): qty 1

## 2019-08-23 MED ORDER — SODIUM CHLORIDE 0.9 % IV SOLN: INTRAVENOUS | Status: AC

## 2019-08-23 MED ORDER — ESCITALOPRAM OXALATE 10 MG PO TABS
10.0000 mg | ORAL_TABLET | Freq: Every day | ORAL | Status: DC
  Administered 2019-08-24 – 2019-08-25 (×2): 10 mg via ORAL
  Filled 2019-08-23 (×2): qty 1

## 2019-08-23 MED ORDER — LISINOPRIL-HYDROCHLOROTHIAZIDE 10-12.5 MG PO TABS: 1.0000 | ORAL_TABLET | Freq: Every day | ORAL | Status: DC

## 2019-08-23 MED ORDER — TAMSULOSIN HCL 0.4 MG PO CAPS
0.4000 mg | ORAL_CAPSULE | Freq: Every day | ORAL | Status: DC
  Administered 2019-08-24 – 2019-08-25 (×2): 0.4 mg via ORAL
  Filled 2019-08-23 (×2): qty 1

## 2019-08-23 MED ORDER — HYDROCHLOROTHIAZIDE 12.5 MG PO CAPS
12.5000 mg | ORAL_CAPSULE | Freq: Every day | ORAL | Status: DC
  Filled 2019-08-23 (×2): qty 1

## 2019-08-23 MED ORDER — TRAZODONE HCL 50 MG PO TABS: 25.0000 mg | ORAL_TABLET | Freq: Every evening | ORAL | Status: DC | PRN

## 2019-08-23 MED ORDER — ONDANSETRON HCL 4 MG/2ML IJ SOLN
4.0000 mg | Freq: Once | INTRAMUSCULAR | Status: AC
  Administered 2019-08-23: 4 mg via INTRAVENOUS
  Filled 2019-08-23: qty 2

## 2019-08-23 MED ORDER — SIMVASTATIN 20 MG PO TABS
20.0000 mg | ORAL_TABLET | Freq: Every day | ORAL | Status: DC
  Administered 2019-08-24 – 2019-08-25 (×2): 20 mg via ORAL
  Filled 2019-08-23 (×2): qty 1

## 2019-08-23 MED ORDER — LISINOPRIL 10 MG PO TABS
10.0000 mg | ORAL_TABLET | Freq: Every day | ORAL | Status: DC
  Administered 2019-08-24 – 2019-08-25 (×2): 10 mg via ORAL
  Filled 2019-08-23 (×2): qty 1

## 2019-08-23 MED ORDER — ALPRAZOLAM 1 MG PO TABS
1.0000 mg | ORAL_TABLET | Freq: Two times a day (BID) | ORAL | Status: DC
  Administered 2019-08-24: 1 mg via ORAL
  Filled 2019-08-23 (×5): qty 1

## 2019-08-23 MED ORDER — POTASSIUM CHLORIDE CRYS ER 20 MEQ PO TBCR
20.0000 meq | EXTENDED_RELEASE_TABLET | Freq: Two times a day (BID) | ORAL | Status: AC
  Administered 2019-08-23 – 2019-08-24 (×2): 20 meq via ORAL
  Filled 2019-08-23 (×2): qty 1

## 2019-08-23 MED ORDER — IBUPROFEN 400 MG PO TABS
400.0000 mg | ORAL_TABLET | Freq: Once | ORAL | Status: AC
  Administered 2019-08-23: 400 mg via ORAL
  Filled 2019-08-23: qty 1

## 2019-08-23 MED ORDER — ENOXAPARIN SODIUM 40 MG/0.4ML ~~LOC~~ SOLN
40.0000 mg | SUBCUTANEOUS | Status: DC
  Administered 2019-08-24 – 2019-08-25 (×2): 40 mg via SUBCUTANEOUS
  Filled 2019-08-23 (×2): qty 0.4

## 2019-08-23 NOTE — ED Triage Notes (Signed)
Patient presents to the ED via [facility] due to altered mental status per family.  Patient lives alone, and had sent confusing text messages to family on friday who came to his home where he lives alone and noted confusion this morning.  Patient complains of headache for 3 days. EMS reports confusion, and ataxia to upper extremities.  Last known well per family was 2 days ago.

## 2019-08-23 NOTE — ED Provider Notes (Signed)
[facility] EMERGENCY DEPARTMENT Provider Note   CSN: [id number] Arrival date & time: 08/23/19  1135     History Chief Complaint  Patient presents with  . Altered Mental Status    Eric Cook is a 74 y.o. male.  Patient presents with confusion, altered mental status noted by family members in the past 2-3 days. Pt had sent confusion text messages, and when went to check on him today, appeared confused. Last at baseline 3 days ago. Pt notes dull headache in past 3 days. Pt limited historian - level 5 caveat. Denies acute, abrupt, thunderclap type headache. No neck pain or stiffness. No fever or chills. Pt denies change in speech or vision. No numbness/weakness. Denies trauma, fall. EMS reports confusion and that gait seemed unsteady. Pt unsure if similar symptoms in past. No chest pain or sob. No abd pain or nvd. Denies gu c/o.   The history is provided by the patient and the EMS personnel. The history is limited by the condition of the patient.       Past Medical History:  Diagnosis Date  . Anxiety   . Hyperlipidemia   . Hypertension     There are no problems to display for this patient.   Past Surgical History:  Procedure Laterality Date  . BACK SURGERY     disc removal       History reviewed. No pertinent family history.  Social History   Tobacco Use  . Smoking status: Current Every Day Smoker    Packs/day: 0.50    Years: 30.00    Pack years: 15.00  . Smokeless tobacco: Never Used  Vaping Use  . Vaping Use: Never used  Substance Use Topics  . Alcohol use: Never  . Drug use: Never    Home Medications Prior to Admission medications   Medication Sig Start Date End Date Taking? Authorizing Provider  ALPRAZolam Prudy Feeler) 1 MG tablet Take 1 tablet twice daily if needed for anxiety for 1 month.  Then go down to 1/2 tablet in the morning and 1 tablet at bedtime if needed x1 month.  Then go down to 1/2 tablet twice daily. 07/31/19   Raliegh Ip, DO    cimetidine (TAGAMET) 400 MG tablet Take 400 mg by mouth 2 (two) times daily.  04/10/19   [provider]  escitalopram (LEXAPRO) 10 MG tablet Take 1 tablet (10 mg total) by mouth daily. 07/31/19   Raliegh Ip, DO  lisinopril-hydrochlorothiazide (ZESTORETIC) 10-12.5 MG tablet Take 1 tablet by mouth daily. 05/25/19   Raliegh Ip, DO  simvastatin (ZOCOR) 20 MG tablet Take 1 tablet (20 mg total) by mouth daily at 6 PM. 05/25/19   Delynn Flavin M, DO  tamsulosin (FLOMAX) 0.4 MG CAPS capsule TAKE 1 CAPSULE EVERY DAY FOR URINARY SYMPTOMS 07/31/19   Raliegh Ip, DO    Allergies    Patient has no known allergies.  Review of Systems   Review of Systems  Constitutional: Negative for fever.  HENT: Negative for sore throat.   Eyes: Negative for redness.  Respiratory: Negative for cough and shortness of breath.   Cardiovascular: Negative for chest pain.  Gastrointestinal: Negative for abdominal pain and vomiting.  Genitourinary: Negative for flank pain.  Musculoskeletal: Negative for back pain and neck pain.  Skin: Negative for rash.  Neurological: Positive for headaches. Negative for weakness and numbness.  Hematological: Does not bruise/bleed easily.  Psychiatric/Behavioral: Positive for confusion.    Physical Exam Updated Vital Signs BP Marland Kitchen)  171/97   Pulse 85   Temp 99.6 F (37.6 C) (Rectal)   Resp 22   Ht 1.753 m (5\' 9" )   Wt 77.1 kg   SpO2 91%   BMI 25.10 kg/m   Physical Exam Vitals and nursing note reviewed.  Constitutional:      Appearance: Normal appearance. He is well-developed.  HENT:     Head: Atraumatic.     Nose: Nose normal.     Mouth/Throat:     Mouth: Mucous membranes are moist.     Pharynx: Oropharynx is clear.  Eyes:     General: No scleral icterus.    Conjunctiva/sclera: Conjunctivae normal.     Pupils: Pupils are equal, round, and reactive to light.  Neck:     Vascular: No carotid bruit.     Trachea: No tracheal deviation.      Comments: No stiffness or rigidity. No bruits.  Cardiovascular:     Rate and Rhythm: Normal rate and regular rhythm.     Pulses: Normal pulses.     Heart sounds: Normal heart sounds. No murmur heard.  No friction rub. No gallop.   Pulmonary:     Effort: Pulmonary effort is normal. No accessory muscle usage or respiratory distress.     Breath sounds: Normal breath sounds.  Abdominal:     General: Bowel sounds are normal. There is no distension.     Palpations: Abdomen is soft.     Tenderness: There is no abdominal tenderness. There is no guarding.  Genitourinary:    Comments: No cva tenderness. Musculoskeletal:        General: No swelling or tenderness.     Cervical back: Normal range of motion and neck supple. No rigidity.     Right lower leg: No edema.     Left lower leg: No edema.     Comments: T/L/S spine non tender, aligned, no step off.   Skin:    General: Skin is warm and dry.     Findings: No rash.     Comments: No cellulitis, rash or skin lesions noted.   Neurological:     Mental Status: He is alert.     Comments: Alert, speech clear. Does appear mildly confused, answering inconsistently to questions asked. No pronator drift. Motor/sens grossly intact bil.   Psychiatric:        Mood and Affect: Mood normal.     ED Results / Procedures / Treatments   Labs (all labs ordered are listed, but only abnormal results are displayed) Results for orders placed or performed during the hospital encounter of 08/23/19  SARS Coronavirus 2 by RT PCR (hospital order, performed in [facility] hospital lab) Nasopharyngeal Nasopharyngeal Swab   Specimen: Nasopharyngeal Swab  Result Value Ref Range   SARS Coronavirus 2 NEGATIVE NEGATIVE  CBC  Result Value Ref Range   WBC 7.7 4.0 - 10.5 K/uL   RBC 4.66 4.22 - 5.81 MIL/uL   Hemoglobin 15.2 13.0 - 17.0 g/dL   HCT [facility] 39 - 52 %   MCV 91.8 80.0 - 100.0 fL   MCH 32.6 26.0 - 34.0 pg   MCHC 35.5 30.0 - 36.0 g/dL   RDW 58.5 27.7 - 82.4 %     Platelets 182 150 - 400 K/uL   nRBC 0.0 0.0 - 0.2 %  Comprehensive metabolic panel  Result Value Ref Range   Sodium 134 (L) 135 - 145 mmol/L   Potassium 3.4 (L) 3.5 - 5.1 mmol/L   Chloride  97 (L) 98 - 111 mmol/L   CO2 24 22 - 32 mmol/L   Glucose, Bld 129 (H) 70 - 99 mg/dL   BUN 15 8 - 23 mg/dL   Creatinine, Ser 0.08 0.61 - 1.24 mg/dL   Calcium 9.7 8.9 - 67.6 mg/dL   Total Protein 7.9 6.5 - 8.1 g/dL   Albumin 5.1 (H) 3.5 - 5.0 g/dL   AST 24 15 - 41 U/L   ALT 19 0 - 44 U/L   Alkaline Phosphatase 89 38 - 126 U/L   Total Bilirubin 1.2 0.3 - 1.2 mg/dL   GFR calc non Af Amer >60 >60 mL/min   GFR calc Af Amer >60 >60 mL/min   Anion gap 13 5 - 15  Urinalysis, Routine w reflex microscopic  Result Value Ref Range   Color, Urine STRAW (A) YELLOW   APPearance CLEAR CLEAR   Specific Gravity, Urine 1.012 1.005 - 1.030   pH 8.0 5.0 - 8.0   Glucose, UA NEGATIVE NEGATIVE mg/dL   Hgb urine dipstick SMALL (A) NEGATIVE   Bilirubin Urine NEGATIVE NEGATIVE   Ketones, ur 5 (A) NEGATIVE mg/dL   Protein, ur NEGATIVE NEGATIVE mg/dL   Nitrite NEGATIVE NEGATIVE   Leukocytes,Ua NEGATIVE NEGATIVE   RBC / HPF 0-5 0 - 5 RBC/hpf   Bacteria, UA NONE SEEN NONE SEEN   Mucus PRESENT     EKG EKG Interpretation  Date/Time:  Sunday August 23 2019 11:46:36 EDT Ventricular Rate:  85 PR Interval:    QRS Duration: 105 QT Interval:  378 QTC Calculation: 450 R Axis:   -23 Text Interpretation: Sinus rhythm No previous tracing Confirmed by Cathren Laine ([id number]) on 08/23/2019 12:02:16 PM   Radiology CT HEAD WO CONTRAST  Result Date: 08/23/2019 CLINICAL DATA:  Mental status change. EXAM: CT HEAD WITHOUT CONTRAST TECHNIQUE: Contiguous axial images were obtained from the base of the skull through the vertex without intravenous contrast. COMPARISON:  None. FINDINGS: Brain: Generalized parenchymal volume loss with commensurate dilatation of the ventricles and sulci. Mild chronic small vessel ischemic changes within  the bilateral periventricular white matter regions. No mass, hemorrhage, edema or other evidence of acute parenchymal abnormality. No extra-axial hemorrhage. Vascular: Chronic calcified atherosclerotic changes of the large vessels at the skull base. No unexpected hyperdense vessel. Skull: Normal. Negative for fracture or focal lesion. Sinuses/Orbits: No acute finding. Other: None. IMPRESSION: 1. No acute findings. No intracranial mass, hemorrhage or edema. 2. Atrophy and chronic small vessel ischemic changes in the white matter. Electronically Signed   By: Bary Richard M.D.   On: 08/23/2019 12:46    Procedures Procedures (including critical care time)  Medications Ordered in ED Medications  ondansetron (ZOFRAN) injection 4 mg (4 mg Intravenous Given 08/23/19 1325)    ED Course  I have reviewed the triage vital signs and the nursing notes.  Pertinent labs & imaging results that were available during my care of the patient were reviewed by me and considered in my medical decision making (see chart for details).    MDM Rules/Calculators/A&P                          Iv ns. Stat labs and imaging ordered.   Reviewed nursing notes and prior charts for additional history.   Son presents, additional hx - states at baseline, pt very sharp, no hx dementia. States earlier in day Friday, was at baseline, now confused. States he called him today, and pt was  talking about 'kissing a bear' and making other confused statements. On arrival to pts home, he was able to walk w steady gait to door, and unlock door, but more confused statements.   On review notes in chart, pt with pcp visit earlier in month, reference made to chronic bzd use, tapering off, on lexapro (pt family/son, unaware of bzd med/use, or recent changes in dose, and pt unable advise on whether abrupt change in pattern of use and/or abrupt cessation).   CT reviewed/interpreted by me - no hem.   Labs pending.   Recheck pt, no change in  status/exam.   Initial labs reviewed/interpreted by me - wbc normal, ua neg for infection.  kcl sl low. k po.  Given altered ms, confusion, feel will require admission/obs, ?whether due to med changes, vs other. Although pt may need mri for completion workup tomorrow, symptoms present for past 2-3 days, so not requiring or candidate for emergent intervention.  Hospitalists consulted for admission.    Final Clinical Impression(s) / ED Diagnoses Final diagnoses:  None    Rx / DC Orders ED Discharge Orders    None       Cathren LaineSteinl, Jenavi Beedle, MD 08/23/19 1444

## 2019-08-23 NOTE — H&P (Signed)
History and Physical    Eric Cook JKD:[id number] DOB: 08-Nov-1945 DOA: 08/23/2019  PCP: Raliegh Ip, DO (Confirm with patient/family/NH records and if not entered, this has to be entered at [facility] point of entry) Patient coming from: home  I have personally briefly reviewed patient's old medical records in [facility]  Chief Complaint: acute confusion and behavior change  HPI: Eric Cook is a 74 y.o. male with medical history significant of HTN, HLD, BPH, GAD who presents with confusion, altered mental status noted by family members in the past 2-3 days. Pt had sent confusing text messages, and when family went to check on him today he appeared confused. Last at baseline 3 days ago. Pt notes dull headache in past 3 days. Pt limited historian - level 5 caveat. Denies acute, abrupt, thunderclap type headache. No neck pain or stiffness. No fever or chills. Pt denies change in speech or vision. No numbness/weakness. Denies trauma, fall. EMS reports confusion and that gait seemed unsteady. Pt unsure if similar symptoms in past. No chest pain or sob. No abd pain or nvd. Denies gu c/o.   The history is provided by the patient and the EMS personnel. The history is limited by the condition of the patient.   ED Course: Afebrile, BP 142/90. Exam per EDP was non-focal. CT head was negative for hemorage or mass lesions. Lab results essentially normal with Na 134, K 3.4, Glu `129. Due to acute confusional state TRH asked to admit for further evaluation.  Review of Systems: As per HPI otherwise 10 point review of systems negative. Does admit to a sharp intermittent frontal HA for the last two days.   Past Medical History:  Diagnosis Date  . Anxiety   . Hyperlipidemia   . Hypertension     Past Surgical History:  Procedure Laterality Date  . BACK SURGERY     disc removal   Soc Hx -  Was married About 20 years - divorced. Has one son. 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 He lives alone and has been I-ADLs. Family is close by and attentive.    reports that he has been smoking. He has a 15.00 pack-year smoking history. He has never used smokeless tobacco. He reports that he does not drink alcohol and does not use drugs.  No Known Allergies  History reviewed. No pertinent family history.   Prior to Admission medications   Medication Sig Start Date End Date Taking? Authorizing Provider  ALPRAZolam Prudy Feeler) 1 MG tablet Take 1 tablet twice daily if needed for anxiety for 1 month.  Then go down to 1/2 tablet in the morning and 1 tablet at bedtime if needed x1 month.  Then go down to 1/2 tablet twice daily. 07/31/19  Yes Delynn Flavin M, DO  cimetidine (TAGAMET) 400 MG tablet Take 400 mg by mouth 2 (two) times daily.  04/10/19  Yes [provider]  escitalopram (LEXAPRO) 10 MG tablet Take 1 tablet (10 mg total) by mouth daily. 07/31/19  Yes Gottschalk, Ashly M, DO  lisinopril-hydrochlorothiazide (ZESTORETIC) 10-12.5 MG tablet Take 1 tablet by mouth daily. 05/25/19  Yes Delynn Flavin M, DO  simvastatin (ZOCOR) 20 MG tablet Take 1 tablet (20 mg total) by mouth daily at 6 PM. 05/25/19  Yes Gottschalk, Ashly M, DO  tamsulosin (FLOMAX) 0.4 MG CAPS capsule TAKE 1 CAPSULE EVERY DAY FOR URINARY SYMPTOMS 07/31/19  Yes Gottschalk, Ashly M, DO  varenicline (CHANTIX) 1 MG tablet  Take 1 mg by mouth 2 (two) times daily.   Yes [provider]    Physical Exam: Vitals:   08/23/19 1200 08/23/19 1230 08/23/19 1300 08/23/19 1319  BP: (!) 171/97 (!) 172/99 (!) 142/90   Pulse: 85 90 83   Resp: 22 22 22    Temp:    99.6 F (37.6 C)  TempSrc:    Rectal  SpO2: 91% 96% 94%   Weight:      Height:        Constitutional: NAD, calm, comfortable Vitals:   08/23/19 1200 08/23/19 1230 08/23/19 1300 08/23/19 1319  BP: (!) 171/97 (!) 172/99 (!) 142/90   Pulse: 85 90 83   Resp: 22 22 22    Temp:    99.6 F (37.6 C)  TempSrc:    Rectal  SpO2: 91% 96% 94%     Weight:      Height:       General:  WNWD man in no distress. Answers questions but seems just a bit lethargic Eyes: PERRL, lids and conjunctivae normal ENMT: Mucous membranes are moist. Posterior pharynx clear of any exudate or lesions. Edentulous Maxilla, native dentition mandible.  Neck: normal, supple, no masses, no thyromegaly Respiratory: clear to auscultation bilaterally, no wheezing, no crackles. Normal respiratory effort. No accessory muscle use.  Cardiovascular: Regular rate and rhythm, no murmurs / rubs / gallops. No extremity edema. 2+ pedal pulses. No carotid bruits.  Abdomen: no tenderness, no masses palpated. No hepatosplenomegaly. Bowel sounds positive.  Musculoskeletal: no clubbing / cyanosis. No joint deformity upper and lower extremities. Good ROM, no contractures. Normal muscle tone.  Skin: no rashes, lesions, ulcers. No induration Neurologic: CN 2-12 normal facial symmetry and movement, EOMI. DTRs nl Biceps, radial, Patellar tendons. MS 5/5 UE, LE. Sensation - normal to light touch. Speech is clear. Remote memory is normal. Recent memory - last 48 hrs is poor.  Psychiatric: Lacks insight to medical condition. Cognition not formally tested. Mood subdued..     Labs on Admission: I have personally reviewed following labs and imaging studies  CBC: Recent Labs  Lab 08/23/19 1324  WBC 7.7  HGB 15.2  HCT 42.8  MCV 91.8  PLT 182   Basic Metabolic Panel: Recent Labs  Lab 08/23/19 1324  NA 134*  K 3.4*  CL 97*  CO2 24  GLUCOSE 129*  BUN 15  CREATININE 0.97  CALCIUM 9.7   GFR: Estimated Creatinine Clearance: 67.8 mL/min (by C-G formula based on SCr of 0.97 mg/dL). Liver Function Tests: Recent Labs  Lab 08/23/19 1324  AST 24  ALT 19  ALKPHOS 89  BILITOT 1.2  PROT 7.9  ALBUMIN 5.1*   No results for input(s): LIPASE, AMYLASE in the last 168 hours. No results for input(s): AMMONIA in the last 168 hours. Coagulation Profile: No results for input(s):  INR, PROTIME in the last 168 hours. Cardiac Enzymes: No results for input(s): CKTOTAL, CKMB, CKMBINDEX, TROPONINI in the last 168 hours. BNP (last 3 results) No results for input(s): PROBNP in the last 8760 hours. HbA1C: No results for input(s): HGBA1C in the last 72 hours. CBG: No results for input(s): GLUCAP in the last 168 hours. Lipid Profile: No results for input(s): CHOL, HDL, LDLCALC, TRIG, CHOLHDL, LDLDIRECT in the last 72 hours. Thyroid Function Tests: No results for input(s): TSH, T4TOTAL, FREET4, T3FREE, THYROIDAB in the last 72 hours. Anemia Panel: No results for input(s): VITAMINB12, FOLATE, FERRITIN, TIBC, IRON, RETICCTPCT in the last 72 hours. Urine analysis:  Component Value Date/Time   COLORURINE STRAW (A) 08/23/2019 1204   APPEARANCEUR CLEAR 08/23/2019 1204   APPEARANCEUR Clear 05/19/2019 1501   LABSPEC 1.012 08/23/2019 1204   PHURINE 8.0 08/23/2019 1204   GLUCOSEU NEGATIVE 08/23/2019 1204   HGBUR SMALL (A) 08/23/2019 1204   BILIRUBINUR NEGATIVE 08/23/2019 1204   BILIRUBINUR Negative 05/19/2019 1501   KETONESUR 5 (A) 08/23/2019 1204   PROTEINUR NEGATIVE 08/23/2019 1204   NITRITE NEGATIVE 08/23/2019 1204   LEUKOCYTESUR NEGATIVE 08/23/2019 1204    Radiological Exams on Admission: CT HEAD WO CONTRAST  Result Date: 08/23/2019 CLINICAL DATA:  Mental status change. EXAM: CT HEAD WITHOUT CONTRAST TECHNIQUE: Contiguous axial images were obtained from the base of the skull through the vertex without intravenous contrast. COMPARISON:  None. FINDINGS: Brain: Generalized parenchymal volume loss with commensurate dilatation of the ventricles and sulci. Mild chronic small vessel ischemic changes within the bilateral periventricular white matter regions. No mass, hemorrhage, edema or other evidence of acute parenchymal abnormality. No extra-axial hemorrhage. Vascular: Chronic calcified atherosclerotic changes of the large vessels at the skull base. No unexpected hyperdense  vessel. Skull: Normal. Negative for fracture or focal lesion. Sinuses/Orbits: No acute finding. Other: None. IMPRESSION: 1. No acute findings. No intracranial mass, hemorrhage or edema. 2. Atrophy and chronic small vessel ischemic changes in the white matter. Electronically Signed   By: Bary Richard M.D.   On: 08/23/2019 12:46    EKG: Independently reviewed. EKG - NSR, occasional PACs.  Assessment/Plan Active Problems:   Acute confusional state   HTN (hypertension)   GAD (generalized anxiety disorder)   BPH (benign prostatic hyperplasia)  (please populate well all problems here in Problem List. (For example, if patient is on BP meds at home and you resume or decide to hold them, it is a problem that needs to be her. Same for CAD, COPD, HLD and so on)   1. Acute confusional state - nonfocal exam, lab is unrevealing. CT head is negative for acute changes. Patient at exam is not at his baseline mental function per son. Reviewed all meds: chantix, which he is taking intermittently, may cause change in emotional state. Plan Med-surg observation   D/C chantix  For persistent confusion - may need MRI brain in AM  May need neuro or psych consult  2. HTN - BP OK. He is slightly hyponatremic and hypokalemic Plan Continue home meds  IVF w/ NS at 50 cc/hr x 12 hrs for Na replacement  Oral K replacement  F/u Bmet in AM  3. GAD - has been on a very slow xanax taper: last adjustment 07/31/19 reducing dos to 1 mg bid. He has been doing well on Lexapro Plan Continue home meds including xanax  4. BPH - continue home meds  5. Code status - discussed with patient and his son (POA). Full Code. Referred them to [email or web address] for further education.    DVT prophylaxis: Lovenox  Code Status: full code Family Communication: Son present for interview and exam. Answered all questions  Disposition Plan: home when stable  Consults called: none (with names) Admission status: obs    Illene Regulus MD [facility] Pager [phone]  If 7PM-7AM, please contact night-coverage [email or web address] Password [email or web address]  08/23/2019, 3:28 PM

## 2019-08-24 ENCOUNTER — Observation Stay (HOSPITAL_COMMUNITY)
Admission: EM | Admit: 2019-08-24 | Discharge: 2019-08-24 | Disposition: A | Discharge status: Home / Self Care | Payer: Medicare HMO | Source: Home / Self Care | Attending: Internal Medicine | Admitting: Internal Medicine

## 2019-08-24 ENCOUNTER — Observation Stay (HOSPITAL_COMMUNITY): Payer: Medicare HMO

## 2019-08-24 DIAGNOSIS — R4182 Altered mental status, unspecified: Secondary | ICD-10-CM

## 2019-08-24 DIAGNOSIS — I10 Essential (primary) hypertension: Secondary | ICD-10-CM | POA: Diagnosis not present

## 2019-08-24 DIAGNOSIS — R569 Unspecified convulsions: Secondary | ICD-10-CM

## 2019-08-24 DIAGNOSIS — S32030A Wedge compression fracture of third lumbar vertebra, initial encounter for closed fracture: Secondary | ICD-10-CM | POA: Diagnosis not present

## 2019-08-24 DIAGNOSIS — G9389 Other specified disorders of brain: Secondary | ICD-10-CM | POA: Diagnosis not present

## 2019-08-24 DIAGNOSIS — F411 Generalized anxiety disorder: Secondary | ICD-10-CM | POA: Diagnosis not present

## 2019-08-24 DIAGNOSIS — F05 Delirium due to known physiological condition: Secondary | ICD-10-CM | POA: Diagnosis not present

## 2019-08-24 DIAGNOSIS — I6782 Cerebral ischemia: Secondary | ICD-10-CM | POA: Diagnosis not present

## 2019-08-24 DIAGNOSIS — I6381 Other cerebral infarction due to occlusion or stenosis of small artery: Secondary | ICD-10-CM | POA: Diagnosis not present

## 2019-08-24 DIAGNOSIS — G9341 Metabolic encephalopathy: Secondary | ICD-10-CM

## 2019-08-24 DIAGNOSIS — G319 Degenerative disease of nervous system, unspecified: Secondary | ICD-10-CM | POA: Diagnosis not present

## 2019-08-24 LAB — RESPIRATORY PANEL BY PCR

## 2019-08-24 LAB — AMMONIA: Ammonia: 22 umol/L (ref 9–35)

## 2019-08-24 LAB — BASIC METABOLIC PANEL
Anion gap: 10 (ref 5–15)
BUN: 18 mg/dL (ref 8–23)
CO2: 27 mmol/L (ref 22–32)
Calcium: 9.3 mg/dL (ref 8.9–10.3)
Chloride: 100 mmol/L (ref 98–111)
Creatinine, Ser: 1.15 mg/dL (ref 0.61–1.24)
GFR calc Af Amer: >60 mL/min (ref >60)
GFR calc non Af Amer: >60 mL/min (ref >60)
Glucose, Bld: 108 mg/dL — ABNORMAL HIGH (ref 70–99)
Potassium: 3.5 mmol/L (ref 3.5–5.1)
Sodium: 137 mmol/L (ref 135–145)

## 2019-08-24 LAB — MAGNESIUM: Magnesium: 2.3 mg/dL (ref 1.7–2.4)

## 2019-08-24 LAB — TSH: TSH: 0.939 u[IU]/mL (ref 0.350–4.500)

## 2019-08-24 LAB — RAPID URINE DRUG SCREEN, HOSP PERFORMED
Amphetamines: NOT DETECTED
Barbiturates: NOT DETECTED
Benzodiazepines: POSITIVE — AB
Cocaine: NOT DETECTED
Opiates: NOT DETECTED
Tetrahydrocannabinol: NOT DETECTED

## 2019-08-24 LAB — FOLATE: Folate: 7.5 ng/mL (ref >5.9)

## 2019-08-24 LAB — VITAMIN B12: Vitamin B-12: 290 pg/mL (ref 180–914)

## 2019-08-24 LAB — T4, FREE: Free T4: 1.1 ng/dL (ref 0.61–1.12)

## 2019-08-24 MED ORDER — ACETAMINOPHEN 325 MG PO TABS
650.0000 mg | ORAL_TABLET | Freq: Four times a day (QID) | ORAL | Status: DC | PRN
  Administered 2019-08-24 – 2019-08-25 (×2): 650 mg via ORAL
  Filled 2019-08-24 (×2): qty 2

## 2019-08-24 MED ORDER — CYANOCOBALAMIN 1000 MCG/ML IJ SOLN
1000.0000 ug | Freq: Once | INTRAMUSCULAR | Status: AC
  Administered 2019-08-24: 1000 ug via INTRAMUSCULAR
  Filled 2019-08-24: qty 1

## 2019-08-24 MED ORDER — ONDANSETRON HCL 4 MG/2ML IJ SOLN: 4.0000 mg | Freq: Four times a day (QID) | INTRAMUSCULAR | Status: DC | PRN

## 2019-08-24 MED ORDER — FOLIC ACID 5 MG/ML IJ SOLN
1.0000 mg | Freq: Every day | INTRAMUSCULAR | Status: DC
  Administered 2019-08-24 – 2019-08-25 (×2): 1 mg via INTRAVENOUS
  Filled 2019-08-24 (×2): qty 0.2

## 2019-08-24 NOTE — Procedures (Signed)
Patient Name: Eric Cook  MRN: [id number]  Epilepsy Attending: Charlsie Quest  Referring Physician/Provider: Dr. Onalee Hua Tat Date: 08/24/2019 Duration: 23.27 mins  Patient history: 74 year old male with altered mental status.  EEG to assess for seizures.  Level of alertness: Awake  AEDs during EEG study: Xanax  Technical aspects: This EEG study was done with scalp electrodes positioned according to the 10-20 International system of electrode placement. Electrical activity was acquired at a sampling rate of 500Hz  and reviewed with a high frequency filter of 70Hz  and a low frequency filter of 1Hz . EEG data were recorded continuously and digitally stored.   Description: The posterior dominant rhythm consists of 9 Hz activity of moderate voltage (25-35 uV) seen predominantly in posterior head regions, symmetric and reactive to eye opening and eye closing. EEG showed continuous 3 to 6 Hz theta-delta slowing in left frontotemporal region. Hyperventilation and photic stimulation were not performed.     ABNORMALITY -Continous slow, left frontotemporal region  IMPRESSION: This study is suggestive of cortical dysfunction in left frontotemporal region which could be secondary to underlying structural abnormality, post-ictal state. No seizures or epileptiform discharges were seen throughout the recording.  Maisha Bogen 

## 2019-08-24 NOTE — Care Management Obs Status (Signed)
MEDICARE OBSERVATION STATUS NOTIFICATION   Patient Details  Name: Eric Cook MRN: [id number] Date of Birth: [birth year]   Medicare Observation Status Notification Given:  Yes (son, [name] signed and received copy)    Corey Harold 08/24/2019, 4:34 PM

## 2019-08-24 NOTE — Progress Notes (Signed)
EEG completed, results pending. 

## 2019-08-24 NOTE — Progress Notes (Signed)
PROGRESS NOTE  Eric Cook TGG:[id number] DOB: 04-21-1945 DOA: 08/23/2019 PCP: Raliegh Ip, DO  Brief History:  74 year old male with a history of generalized anxiety disorder, hypertension, hyperlipidemia, tobacco abuse presenting with confusion.  The patient's son supplements the history.  At baseline, the patient is " sharp" according to the patient's son.  The patient lives by himself and is independent with all his activities of daily living.  There has been no discernible decline in his mental or functional status according to the patient's son.  The patient was last noted to be "normal" on 08/21/2019.  However over the last 2 to 3 days, the family had noted some unusual text messages.  They went to check on the patient, and the patient had word finding difficulties and was confused. Further history reveals that the patient has been receiving alprazolam with which the patient's son was unaware.  The patient denies stopping it abruptly or overusing alprazolam.  In addition, the son states that the patient has been intermittently on Chantix, but the patient states that he has not been taking it for over a month now.  Patient denies any new medications or illicit drug use.  He states that he quit smoking about a month ago.  The patient has been complaining of cold-like symptoms including coughing and chest congestion.  He denied any frank fevers, chills, chest pain, shortness breath, nausea, vomiting, diarrhea, abdominal pain, dysuria, hematuria.  He has had a frontal headache but denied any neck pain, visual disturbance, focal extremity weakness. In the emergency department, the patient was afebrile hemodynamically stable.  Low-grade temperature of 9 9.8 F.  BMP showed sodium 134, potassium 3.4, WBC 7.7, hemoglobin 15.2, platelets 182,000.  CT of the brain showed atrophy without any acute findings.  Chest x-ray showed slight increase in interstitial markings.  Urinalysis was negative  for pyuria  Assessment/Plan:  Acute metabolic/toxic encephalopathy -Etiology unclear, work-up in progress -Mental status is improving, but remains confused fused -Concerned about Xanax withdrawal as well as use of Chantix -Serum B12 -TSH -Folic acid -EEG -MRI brain -Urine drug screen -UA negative for pyuria -Personally reviewed EKG--sinus rhythm, nonspecific T wave change -Personally reviewed chest x-ray--slight increased interstitial markings without consolidation or edema  Acute bronchitis -Viral respiratory panel -COVID-19 negative  Essential hypertension -Continue lisinopril -Hold HCTZ today  Hyperlipidemia -Continue statin  Generalized anxiety disorder -Continue Lexapro and alprazolam  Hypokalemia -Replete -Check magnesium  Hyponatremia -Likely secondary to HCTZ -Hold HCTZ temporarily -A.m. BMP        Status is: Observation  The patient will require care spanning > 2 midnights and should be moved to inpatient because: Altered mental status  Dispo: The patient is from: Home              Anticipated d/c is to: Home              Anticipated d/c date is: 1 day              Patient currently is not medically stable to d/c.        Family Communication:   Son updated at bedside 8/2  Consultants:  none  Code Status:  FULL  DVT Prophylaxis:  [place] Lovenox   Procedures: As Listed in Progress Note Above  Antibiotics: None      Subjective: Patient denies fevers, chills, headache, chest pain, dyspnea, nausea, vomiting, diarrhea, abdominal pain, dysuria, hematuria, hematochezia, and melena.  Objective: Vitals:   08/23/19 1853 08/23/19 2141 08/23/19 2148 08/24/19 0440  BP: (!) 156/80 (!) 132/77  (!) 105/64  Pulse: 72 67 73 60  Resp: 18 19  18   Temp: 98.9 F (37.2 C) 98.4 F (36.9 C)  98.1 F (36.7 C)  TempSrc: Oral Oral  Oral  SpO2: 97% 91% 92% 95%  Weight:      Height:        Intake/Output Summary (Last 24 hours) at 08/24/2019  0739 Last data filed at 08/23/2019 1713 Gross per 24 hour  Intake 450 ml  Output --  Net 450 ml   Weight change:  Exam:   General:  Pt is alert, follows commands appropriately, not in acute distress  HEENT: No icterus, No thrush, No neck mass, [place]/AT  Cardiovascular: RRR, S1/S2, no rubs, no gallops  Respiratory: diminished BS, but CTA bilaterally, no wheezing, no crackles, no rhonchi  Abdomen: Soft/+BS, non tender, non distended, no guarding  Extremities: No edema, No lymphangitis, No petechiae, No rashes, no synovitis  Neuro:  CN II-XII intact, strength 4/5 in RUE, RLE, strength 4/5 LUE, LLE; sensation intact bilateral; no dysmetria; babinski equivocal     Data Reviewed: I have personally reviewed following labs and imaging studies Basic Metabolic Panel: Recent Labs  Lab 08/23/19 1324 08/24/19 0650  NA 134* 137  K 3.4* 3.5  CL 97* 100  CO2 24 27  GLUCOSE 129* 108*  BUN 15 18  CREATININE 0.97 1.15  CALCIUM 9.7 9.3   Liver Function Tests: Recent Labs  Lab 08/23/19 1324  AST 24  ALT 19  ALKPHOS 89  BILITOT 1.2  PROT 7.9  ALBUMIN 5.1*   No results for input(s): LIPASE, AMYLASE in the last 168 hours. Recent Labs  Lab 08/24/19 0650  AMMONIA 22   Coagulation Profile: No results for input(s): INR, PROTIME in the last 168 hours. CBC: Recent Labs  Lab 08/23/19 1324  WBC 7.7  HGB 15.2  HCT 42.8  MCV 91.8  PLT 182   Cardiac Enzymes: No results for input(s): CKTOTAL, CKMB, CKMBINDEX, TROPONINI in the last 168 hours. BNP: Invalid input(s): POCBNP CBG: No results for input(s): GLUCAP in the last 168 hours. HbA1C: No results for input(s): HGBA1C in the last 72 hours. Urine analysis:    Component Value Date/Time   COLORURINE STRAW (A) 08/23/2019 1204   APPEARANCEUR CLEAR 08/23/2019 1204   APPEARANCEUR Clear 05/19/2019 1501   LABSPEC 1.012 08/23/2019 1204   PHURINE 8.0 08/23/2019 1204   GLUCOSEU NEGATIVE 08/23/2019 1204   HGBUR SMALL (A)  08/23/2019 1204   BILIRUBINUR NEGATIVE 08/23/2019 1204   BILIRUBINUR Negative 05/19/2019 1501   KETONESUR 5 (A) 08/23/2019 1204   PROTEINUR NEGATIVE 08/23/2019 1204   NITRITE NEGATIVE 08/23/2019 1204   LEUKOCYTESUR NEGATIVE 08/23/2019 1204   Sepsis Labs: @LABRCNTIP (procalcitonin:4,lacticidven:4) ) Recent Results (from the past 240 hour(s))  SARS Coronavirus 2 by RT PCR (hospital order, performed in [facility] hospital lab) Nasopharyngeal Nasopharyngeal Swab     Status: None   Collection Time: 08/23/19  1:30 PM   Specimen: Nasopharyngeal Swab  Result Value Ref Range Status   SARS Coronavirus 2 NEGATIVE NEGATIVE Final    Comment: (NOTE) SARS-CoV-2 target nucleic acids are NOT DETECTED.  The SARS-CoV-2 RNA is generally detectable in upper and lower respiratory specimens during the acute phase of infection. The lowest concentration of SARS-CoV-2 viral copies this assay can detect is 250 copies / mL. A negative result does not preclude SARS-CoV-2 infection and should not  be used as the sole basis for treatment or other patient management decisions.  A negative result may occur with improper specimen collection / handling, submission of specimen other than nasopharyngeal swab, presence of viral mutation(s) within the areas targeted by this assay, and inadequate number of viral copies (<250 copies / mL). A negative result must be combined with clinical observations, patient history, and epidemiological information.  Fact Sheet for Patients:   BoilerBrush.com.cy  Fact Sheet for Healthcare Providers: https://pope.com/  This test is not yet approved or  cleared by the [place] FDA and has been authorized for detection and/or diagnosis of SARS-CoV-2 by FDA under an Emergency Use Authorization (EUA).  This EUA will remain in effect (meaning this test can be used) for the duration of the COVID-19 declaration under Section 564(b)(1) of  the Act, 21 U.S.C. section 360bbb-3(b)(1), unless the authorization is terminated or revoked sooner.  Performed at [facility], [address] [place], [place] [address]      Scheduled Meds: . ALPRAZolam  1 mg Oral BID  . enoxaparin (LOVENOX) injection  40 mg Subcutaneous Q24H  . escitalopram  10 mg Oral Daily  . famotidine  20 mg Oral BID  . lisinopril  10 mg Oral Daily   And  . hydrochlorothiazide  12.5 mg Oral Daily  . potassium chloride  20 mEq Oral BID  . simvastatin  20 mg Oral q1800  . tamsulosin  0.4 mg Oral Daily   Continuous Infusions:  Procedures/Studies: DG Chest 2 View  Result Date: 08/23/2019 CLINICAL DATA:  Hypoxia and altered mental status. EXAM: CHEST - 2 VIEW COMPARISON:  05/18/2013 FINDINGS: Upper normal heart size. Unchanged mediastinal contours with aortic atherosclerosis. Mild bronchial and interstitial thickening, increased from prior. No focal airspace disease, pleural effusion, or pneumothorax. No acute osseous abnormalities are seen. IMPRESSION: Mild bronchial and interstitial thickening, likely bronchitic and smoking related. The possibility of superimposed pulmonary edema is not excluded. Aortic Atherosclerosis (ICD10-I70.0). Electronically Signed   By: Narda Rutherford M.D.   On: 08/23/2019 17:51   CT HEAD WO CONTRAST  Result Date: 08/23/2019 CLINICAL DATA:  Mental status change. EXAM: CT HEAD WITHOUT CONTRAST TECHNIQUE: Contiguous axial images were obtained from the base of the skull through the vertex without intravenous contrast. COMPARISON:  None. FINDINGS: Brain: Generalized parenchymal volume loss with commensurate dilatation of the ventricles and sulci. Mild chronic small vessel ischemic changes within the bilateral periventricular white matter regions. No mass, hemorrhage, edema or other evidence of acute parenchymal abnormality. No extra-axial hemorrhage. Vascular: Chronic calcified atherosclerotic changes of the large vessels at the skull base. No  unexpected hyperdense vessel. Skull: Normal. Negative for fracture or focal lesion. Sinuses/Orbits: No acute finding. Other: None. IMPRESSION: 1. No acute findings. No intracranial mass, hemorrhage or edema. 2. Atrophy and chronic small vessel ischemic changes in the white matter. Electronically Signed   By: Bary Richard M.D.   On: 08/23/2019 12:46    Catarina Hartshorn, DO  [facility]  If 7PM-7AM, please contact night-coverage [email or web address] Password [email or web address] 08/24/2019, 7:39 AM   LOS: 0 days

## 2019-08-25 ENCOUNTER — Observation Stay (HOSPITAL_COMMUNITY): Payer: Medicare HMO

## 2019-08-25 DIAGNOSIS — R4182 Altered mental status, unspecified: Secondary | ICD-10-CM | POA: Diagnosis not present

## 2019-08-25 DIAGNOSIS — R569 Unspecified convulsions: Secondary | ICD-10-CM | POA: Diagnosis not present

## 2019-08-25 DIAGNOSIS — M5136 Other intervertebral disc degeneration, lumbar region: Secondary | ICD-10-CM | POA: Diagnosis not present

## 2019-08-25 DIAGNOSIS — F411 Generalized anxiety disorder: Secondary | ICD-10-CM | POA: Diagnosis not present

## 2019-08-25 DIAGNOSIS — S32030A Wedge compression fracture of third lumbar vertebra, initial encounter for closed fracture: Secondary | ICD-10-CM | POA: Diagnosis not present

## 2019-08-25 DIAGNOSIS — M4856XA Collapsed vertebra, not elsewhere classified, lumbar region, initial encounter for fracture: Secondary | ICD-10-CM | POA: Diagnosis not present

## 2019-08-25 DIAGNOSIS — I10 Essential (primary) hypertension: Secondary | ICD-10-CM | POA: Diagnosis not present

## 2019-08-25 DIAGNOSIS — M5126 Other intervertebral disc displacement, lumbar region: Secondary | ICD-10-CM | POA: Diagnosis not present

## 2019-08-25 DIAGNOSIS — I700 Atherosclerosis of aorta: Secondary | ICD-10-CM | POA: Diagnosis not present

## 2019-08-25 DIAGNOSIS — R600 Localized edema: Secondary | ICD-10-CM | POA: Diagnosis not present

## 2019-08-25 LAB — BASIC METABOLIC PANEL
Anion gap: 13 (ref 5–15)
BUN: 26 mg/dL — ABNORMAL HIGH (ref 8–23)
CO2: 25 mmol/L (ref 22–32)
Calcium: 9.4 mg/dL (ref 8.9–10.3)
Chloride: 99 mmol/L (ref 98–111)
Creatinine, Ser: 1.1 mg/dL (ref 0.61–1.24)
GFR calc Af Amer: >60 mL/min (ref >60)
GFR calc non Af Amer: >60 mL/min (ref >60)
Glucose, Bld: 93 mg/dL (ref 70–99)
Potassium: 3.6 mmol/L (ref 3.5–5.1)
Sodium: 137 mmol/L (ref 135–145)

## 2019-08-25 MED ORDER — TRAMADOL HCL 50 MG PO TABS: 50.0000 mg | ORAL_TABLET | Freq: Four times a day (QID) | ORAL | 0 refills | Status: DC | PRN

## 2019-08-25 MED ORDER — LEVETIRACETAM IN NACL 500 MG/100ML IV SOLN
500.0000 mg | Freq: Once | INTRAVENOUS | Status: AC
  Administered 2019-08-25: 500 mg via INTRAVENOUS
  Filled 2019-08-25: qty 100

## 2019-08-25 MED ORDER — LEVETIRACETAM 250 MG PO TABS: 250.0000 mg | ORAL_TABLET | Freq: Two times a day (BID) | ORAL | 1 refills | Status: DC

## 2019-08-25 MED ORDER — VITAMIN D3 25 MCG PO TABS: 1000.0000 [IU] | ORAL_TABLET | Freq: Every day | ORAL | 1 refills | Status: DC

## 2019-08-25 MED ORDER — TRAMADOL HCL 50 MG PO TABS: 50.0000 mg | ORAL_TABLET | Freq: Four times a day (QID) | ORAL | Status: DC | PRN

## 2019-08-25 MED ORDER — VITAMIN D 25 MCG (1000 UNIT) PO TABS
1000.0000 [IU] | ORAL_TABLET | Freq: Every day | ORAL | Status: DC
  Administered 2019-08-25: 1000 [IU] via ORAL
  Filled 2019-08-25: qty 1

## 2019-08-25 MED ORDER — LEVETIRACETAM 250 MG PO TABS
250.0000 mg | ORAL_TABLET | Freq: Two times a day (BID) | ORAL | Status: DC
  Administered 2019-08-25 (×2): 250 mg via ORAL
  Filled 2019-08-25 (×2): qty 1

## 2019-08-25 NOTE — Progress Notes (Signed)
Orthopedic Tech Progress Note Patient Details:  Eric Cook 01-Apr-1945 [id number] Called in brace. Patient ID: [name], male   DOB: October 07, 1945, 74 y.o.   MRN: [id number]   Michelle Piper 08/25/2019, 5:15 PM

## 2019-08-25 NOTE — Consult Note (Signed)
[facility] Eric Cook A. Eric Pilgrim, MD     [email or web address]          Eric Cook is an 74 y.o. male.   ASSESSMENT/PLAN: 1.  Recurrent episode of word finding difficulties and confusion with the EEG showing focal left frontotemporal slowing.  The picture is consistent with the complex partial seizures.  Consequently, the patient be started on Keppra. 2.  Mildly impaired speed of thinking/bradyphernia possibly due to above and also mild vitamin B-12 deficiency.  B12 deficiency has been replaced. 3.  Acute flareup of chronic low back pain: X-ray will be obtained.    This is a 74 year old right-handed white male who lives independently and is typically very active.  He has a longstanding history of chronic low back pain which flares up episodically.  He currently has had a recent flareup although not as severe as some he has had in the past per the son.  The patient was noted to be confused which is noted with text messaging with the patient.  They went to see the patient and noticed that he had problems with word finding and often making incoherent statements such as "I just kiss the bull".  More significantly appears however that he had problems with finding his words.  He appears of gotten better during most of his hospital stay but yesterday he became acutely confused twice per the son.  The patient has been having frontal headaches for last several weeks.  The patient himself reports that he has had really severe back pain and he thinks the back pain is what triggered his inability to talk.  The son reports that he has had a longstanding history of back pain which the patient does agree to.  Son reports that sometimes his pain is gone to the point where he was not able to move.  He has had back surgery over 20 years ago.  The patient does not report focal neurological deficit.  There is no history of loss of consciousness or convulsions.  No chest pain or shortness of breath.  The son  reports that he is quite active and seems to be cognitively fine at baseline.  Review of systems otherwise negative.    GENERAL: This is a thin male who appears to be okay at this time.  HEENT: The neck is supple no trauma noted.  ABDOMEN: soft  EXTREMITIES: No edema   BACK: Normal  SKIN: Normal by inspection.    MENTAL STATUS: Patient is awake and cooperates with evaluation.  He does seem to have reduced spontaneous speech.  He simply had to be prompted and seems somewhat slow to respond.  He is oriented to person, hospital, city and year.  No dysarthria noted.  The patient was able to name all 5 objects at the bedside.  Comprehension seems good.  CRANIAL NERVES: Pupils are equal, round and reactive to light and accomodation; extra ocular movements are full, there is no significant nystagmus; visual fields are full; upper and lower facial muscles are normal in strength and symmetric, there is no flattening of the nasolabial folds; tongue is midline; uvula is midline; shoulder elevation is normal.  MOTOR: Normal tone, bulk and strength; no pronator drift.  COORDINATION: Left finger to nose is normal, right finger to nose is normal, No rest tremor; no intention tremor; no postural tremor; no bradykinesia.  REFLEXES: Deep tendon reflexes are symmetrical and normal.   SENSATION: Normal to light touch, temperature, and pain.     Blood  pressure 130/70, pulse 67, temperature 98.7 F (37.1 C), temperature source Oral, resp. rate 18, height 5\' 9"  (1.753 m), weight 77.1 kg, SpO2 (!) 89 %.  Past Medical History:  Diagnosis Date   Anxiety    Hyperlipidemia    Hypertension     Past Surgical History:  Procedure Laterality Date   BACK SURGERY     disc removal    History reviewed. No pertinent family history.  Social History:  reports that he has been smoking. He has a 15.00 pack-year smoking history. He has never used smokeless tobacco. He reports that he does not drink alcohol  and does not use drugs.  Allergies: No Known Allergies  Medications: Prior to Admission medications   Medication Sig Start Date End Date Taking? Authorizing Provider  ALPRAZolam ) 1 MG tablet Take 1 tablet twice daily if needed for anxiety for 1 month.  Then go down to 1/2 tablet in the morning and 1 tablet at bedtime if needed x1 month.  Then go down to 1/2 tablet twice daily. 07/31/19  Yes 10/01/19 M, DO  cimetidine (TAGAMET) 400 MG tablet Take 400 mg by mouth 2 (two) times daily.  04/10/19  Yes [provider]  escitalopram (LEXAPRO) 10 MG tablet Take 1 tablet (10 mg total) by mouth daily. 07/31/19  Yes Gottschalk, Ashly M, DO  lisinopril-hydrochlorothiazide (ZESTORETIC) 10-12.5 MG tablet Take 1 tablet by mouth daily. 05/25/19  Yes 07/25/19 M, DO  simvastatin (ZOCOR) 20 MG tablet Take 1 tablet (20 mg total) by mouth daily at 6 PM. 05/25/19  Yes Gottschalk, Ashly M, DO  tamsulosin (FLOMAX) 0.4 MG CAPS capsule TAKE 1 CAPSULE EVERY DAY FOR URINARY SYMPTOMS 07/31/19  Yes Gottschalk, Ashly M, DO  varenicline (CHANTIX) 1 MG tablet Take 1 mg by mouth 2 (two) times daily.   Yes [provider]    Scheduled Meds:  ALPRAZolam  1 mg Oral BID   enoxaparin (LOVENOX) injection  40 mg Subcutaneous Q24H   escitalopram  10 mg Oral Daily   famotidine  20 mg Oral BID   folic acid  1 mg Intravenous Daily   lisinopril  10 mg Oral Daily   simvastatin  20 mg Oral q1800   tamsulosin  0.4 mg Oral Daily   Continuous Infusions: PRN Meds:.acetaminophen, ondansetron (ZOFRAN) IV, traZODone     Results for orders placed or performed during the hospital encounter of 08/23/19 (from the past 48 hour(s))  Urinalysis, Routine w reflex microscopic     Status: Abnormal   Collection Time: 08/23/19 12:04 PM  Result Value Ref Range   Color, Urine STRAW (A) YELLOW   APPearance CLEAR CLEAR   Specific Gravity, Urine 1.012 1.005 - 1.030   pH 8.0 5.0 - 8.0   Glucose, UA NEGATIVE  NEGATIVE mg/dL   Hgb urine dipstick SMALL (A) NEGATIVE   Bilirubin Urine NEGATIVE NEGATIVE   Ketones, ur 5 (A) NEGATIVE mg/dL   Protein, ur NEGATIVE NEGATIVE mg/dL   Nitrite NEGATIVE NEGATIVE   Leukocytes,Ua NEGATIVE NEGATIVE   RBC / HPF 0-5 0 - 5 RBC/hpf   Bacteria, UA NONE SEEN NONE SEEN   Mucus PRESENT     Comment: Performed at [facility], [address], [place], [place]  CBC     Status: None   Collection Time: 08/23/19  1:24 PM  Result Value Ref Range   WBC 7.7 4.0 - 10.5 K/uL   RBC 4.66 4.22 - 5.81 MIL/uL   Hemoglobin 15.2 13.0 -  17.0 g/dL   HCT 29.5 39 - 52 %   MCV 91.8 80.0 - 100.0 fL   MCH 32.6 26.0 - 34.0 pg   MCHC 35.5 30.0 - 36.0 g/dL   RDW 28.4 13.2 - 44.0 %   Platelets 182 150 - 400 K/uL   nRBC 0.0 0.0 - 0.2 %    Comment: Performed at [facility], [address] [place], [place] [address]  Comprehensive metabolic panel     Status: Abnormal   Collection Time: 08/23/19  1:24 PM  Result Value Ref Range   Sodium 134 (L) 135 - 145 mmol/L   Potassium 3.4 (L) 3.5 - 5.1 mmol/L   Chloride 97 (L) 98 - 111 mmol/L   CO2 24 22 - 32 mmol/L   Glucose, Bld 129 (H) 70 - 99 mg/dL    Comment: Glucose reference range applies only to samples taken after fasting for at least 8 hours.   BUN 15 8 - 23 mg/dL   Creatinine, Ser 5.36 0.61 - 1.24 mg/dL   Calcium 9.7 8.9 - 64.4 mg/dL   Total Protein 7.9 6.5 - 8.1 g/dL   Albumin 5.1 (H) 3.5 - 5.0 g/dL   AST 24 15 - 41 U/L   ALT 19 0 - 44 U/L   Alkaline Phosphatase 89 38 - 126 U/L   Total Bilirubin 1.2 0.3 - 1.2 mg/dL   GFR calc non Af Amer >60 >60 mL/min   GFR calc Af Amer >60 >60 mL/min   Anion gap 13 5 - 15    Comment: Performed at [facility], [address] [place], [place] [address]  Brain natriuretic peptide     Status: Abnormal   Collection Time: 08/23/19  1:24 PM  Result Value Ref Range   B Natriuretic Peptide 129.0 (H) 0.0 - 100.0 pg/mL    Comment: Performed at [facility], [address] [place], [place]  [address]  SARS Coronavirus 2 by RT PCR (hospital order, performed in [facility] hospital lab) Nasopharyngeal Nasopharyngeal Swab     Status: None   Collection Time: 08/23/19  1:30 PM   Specimen: Nasopharyngeal Swab  Result Value Ref Range   SARS Coronavirus 2 NEGATIVE NEGATIVE    Comment: (NOTE) SARS-CoV-2 target nucleic acids are NOT DETECTED.  The SARS-CoV-2 RNA is generally detectable in upper and lower respiratory specimens during the acute phase of infection. The lowest concentration of SARS-CoV-2 viral copies this assay can detect is 250 copies / mL. A negative result does not preclude SARS-CoV-2 infection and should not be used as the sole basis for treatment or other patient management decisions.  A negative result may occur with improper specimen collection / handling, submission of specimen other than nasopharyngeal swab, presence of viral mutation(s) within the areas targeted by this assay, and inadequate number of viral copies (<250 copies / mL). A negative result must be combined with clinical observations, patient history, and epidemiological information.  Fact Sheet for Patients:   BoilerBrush.com.cy  Fact Sheet for Healthcare Providers: https://pope.com/  This test is not yet approved or  cleared by the [place] FDA and has been authorized for detection and/or diagnosis of SARS-CoV-2 by FDA under an Emergency Use Authorization (EUA).  This EUA will remain in effect (meaning this test can be used) for the duration of the COVID-19 declaration under Section 564(b)(1) of the Act, 21 U.S.C. section 360bbb-3(b)(1), unless the authorization is terminated or revoked sooner.  Performed at [facility], [address], [place], [place]  [address]   Urine rapid drug screen (hosp performed)     Status: Abnormal   Collection Time: 08/24/19  6:42 AM  Result Value Ref Range   Opiates NONE DETECTED NONE DETECTED   Cocaine NONE  DETECTED NONE DETECTED   Benzodiazepines POSITIVE (A) NONE DETECTED   Amphetamines NONE DETECTED NONE DETECTED   Tetrahydrocannabinol NONE DETECTED NONE DETECTED   Barbiturates NONE DETECTED NONE DETECTED    Comment: (NOTE) DRUG SCREEN FOR MEDICAL PURPOSES ONLY.  IF CONFIRMATION IS NEEDED FOR ANY PURPOSE, NOTIFY LAB WITHIN 5 DAYS.  LOWEST DETECTABLE LIMITS FOR URINE DRUG SCREEN Drug Class                     Cutoff (ng/mL) Amphetamine and metabolites    1000 Barbiturate and metabolites    200 Benzodiazepine                 200 Tricyclics and metabolites     300 Opiates and metabolites        300 Cocaine and metabolites        300 THC                            50 Performed at [facility], [address], [place], [place] [address]   Basic metabolic panel     Status: Abnormal   Collection Time: 08/24/19  6:50 AM  Result Value Ref Range   Sodium 137 135 - 145 mmol/L   Potassium 3.5 3.5 - 5.1 mmol/L   Chloride 100 98 - 111 mmol/L   CO2 27 22 - 32 mmol/L   Glucose, Bld 108 (H) 70 - 99 mg/dL    Comment: Glucose reference range applies only to samples taken after fasting for at least 8 hours.   BUN 18 8 - 23 mg/dL   Creatinine, Ser 0.98 0.61 - 1.24 mg/dL   Calcium 9.3 8.9 - 11.9 mg/dL   GFR calc non Af Amer >60 >60 mL/min   GFR calc Af Amer >60 >60 mL/min   Anion gap 10 5 - 15    Comment: Performed at [facility], [address], [place], [place] [address]  TSH     Status: None   Collection Time: 08/24/19  6:50 AM  Result Value Ref Range   TSH 0.939 0.350 - 4.500 uIU/mL    Comment: Performed by a 3rd Generation assay with a functional sensitivity of <=0.01 uIU/mL. Performed at [facility], [address] [place], [place] [address]   T4, free     Status: None   Collection Time: 08/24/19  6:50 AM  Result Value Ref Range   Free T4 1.10 0.61 - 1.12 ng/dL    Comment: (NOTE) Biotin ingestion may interfere with free T4 tests. If the results are inconsistent with the TSH level,  previous test results, or the clinical presentation, then consider biotin interference. If needed, order repeat testing after stopping biotin. Performed at [facility], [address] [place], [place] [address]   Vitamin B12     Status: None   Collection Time: 08/24/19  6:50 AM  Result Value Ref Range   Vitamin B-12 290 180 - 914 pg/mL    Comment: (NOTE) This assay is not validated for testing neonatal or myeloproliferative syndrome specimens for Vitamin B12 levels. Performed at [facility], [address], [place], [place] [address]   Folate     Status: None  Collection Time: 08/24/19  6:50 AM  Result Value Ref Range   Folate 7.5 >5.9 ng/mL    Comment: Performed at [facility], [address] [place], [place] [address]  Ammonia     Status: None   Collection Time: 08/24/19  6:50 AM  Result Value Ref Range   Ammonia 22 9 - 35 umol/L    Comment: Performed at [facility], [address] [place], [place] [address]  Magnesium     Status: None   Collection Time: 08/24/19  6:50 AM  Result Value Ref Range   Magnesium 2.3 1.7 - 2.4 mg/dL    Comment: Performed at [facility], [address] [place], [place] [address]  Respiratory Panel by PCR     Status: None   Collection Time: 08/24/19  7:54 AM   Specimen: Nasopharyngeal Swab; Respiratory  Result Value Ref Range   Adenovirus NOT DETECTED NOT DETECTED   Coronavirus 229E NOT DETECTED NOT DETECTED    Comment: (NOTE) The Coronavirus on the Respiratory Panel, DOES NOT test for the novel  Coronavirus (2019 nCoV)    Coronavirus HKU1 NOT DETECTED NOT DETECTED   Coronavirus NL63 NOT DETECTED NOT DETECTED   Coronavirus OC43 NOT DETECTED NOT DETECTED   Metapneumovirus NOT DETECTED NOT DETECTED   Rhinovirus / Enterovirus NOT DETECTED NOT DETECTED   Influenza A NOT DETECTED NOT DETECTED   Influenza B NOT DETECTED NOT DETECTED   Parainfluenza Virus 1 NOT DETECTED NOT DETECTED   Parainfluenza Virus 2 NOT DETECTED NOT DETECTED    Parainfluenza Virus 3 NOT DETECTED NOT DETECTED   Parainfluenza Virus 4 NOT DETECTED NOT DETECTED   Respiratory Syncytial Virus NOT DETECTED NOT DETECTED   Bordetella pertussis NOT DETECTED NOT DETECTED   Chlamydophila pneumoniae NOT DETECTED NOT DETECTED   Mycoplasma pneumoniae NOT DETECTED NOT DETECTED    Comment: Performed at [facility], [address] [place], [place] [address]  Basic metabolic panel     Status: Abnormal   Collection Time: 08/25/19  4:43 AM  Result Value Ref Range   Sodium 137 135 - 145 mmol/L   Potassium 3.6 3.5 - 5.1 mmol/L   Chloride 99 98 - 111 mmol/L   CO2 25 22 - 32 mmol/L   Glucose, Bld 93 70 - 99 mg/dL    Comment: Glucose reference range applies only to samples taken after fasting for at least 8 hours.   BUN 26 (H) 8 - 23 mg/dL   Creatinine, Ser 2.951.10 0.61 - 1.24 mg/dL   Calcium 9.4 8.9 - 62.110.3 mg/dL   GFR calc non Af Amer >60 >60 mL/min   GFR calc Af Amer >60 >60 mL/min   Anion gap 13 5 - 15    Comment: Performed at [facility], [address] [place], [place] [address]    Studies/Results:    BRAIN MRI  FINDINGS: Brain: Diffuse parenchymal volume loss with ex vacuo dilatation. Scattered and confluent T2/FLAIR hyperintense foci involving the periventricular/subcortical white matter and pons are nonspecific however commonly associated with chronic microvascular ischemic changes. Sequela of remote left basal ganglia lacunar infarct.  No acute infarct or intracranial hemorrhage. No midline shift, ventriculomegaly or extra-axial fluid collection. No mass lesion.  Vascular: Normal flow voids.  Skull and upper cervical spine: Normal marrow signal.  Sinuses/Orbits: Normal orbits. Clear paranasal sinuses. No mastoid effusion.  Other: None.  IMPRESSION: No acute intracranial process. Small remote left basal ganglia lacunar infarct.  Moderate cerebral atrophy. Mild chronic microvascular ischemic changes.  Brain MRI is  reviewed in person.  No acute changes are noted on DWI.  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     EEG Description: The posterior dominant rhythm consists of 9 Hz activity of moderate voltage (25-35 uV) seen predominantly in posterior head regions, symmetric and reactive to eye opening and eye closing. EEG showed continuous 3 to 6 Hz theta-delta slowing in left frontotemporal region. Hyperventilation and photic stimulation were not performed.     ABNORMALITY -Continous slow, left frontotemporal region  IMPRESSION: This study is suggestive of cortical dysfunction in left frontotemporal region which could be secondary to underlying structural abnormality, post-ictal state. No seizures or epileptiform discharges were seen throughout the recording.       Vasilios Ottaway A. Eric Cook, M.D.  Diplomate, Biomedical engineer of Psychiatry and Neurology ( Neurology). 08/25/2019, 6:26 AM

## 2019-08-25 NOTE — Progress Notes (Signed)
PROGRESS NOTE  Eric HotterRonald R Correa ZOX:[id number] DOB: [birth year] DOA: 08/23/2019 PCP: Raliegh IpGottschalk, Ashly M, DO  Brief History:  74 year old male with a history of generalized anxiety disorder, hypertension, hyperlipidemia, tobacco abuse presenting with confusion.  The patient's son supplements the history.  At baseline, the patient is " sharp" according to the patient's son.  The patient lives by himself and is independent with all his activities of daily living.  There has been no discernible decline in his mental or functional status according to the patient's son.  The patient was last noted to be "normal" on 08/21/2019.  However over the last 2 to 3 days, the family had noted some unusual text messages.  They went to check on the patient, and the patient had word finding difficulties and was confused. Further history reveals that the patient has been receiving alprazolam with which the patient's son was unaware.  The patient denies stopping it abruptly or overusing alprazolam.  In addition, the son states that the patient has been intermittently on Chantix, but the patient states that he has not been taking it for over a month now.  Patient denies any new medications or illicit drug use.  He states that he quit smoking about a month ago.  The patient has been complaining of cold-like symptoms including coughing and chest congestion.  He denied any frank fevers, chills, chest pain, shortness breath, nausea, vomiting, diarrhea, abdominal pain, dysuria, hematuria.  He has had a frontal headache but denied any neck pain, visual disturbance, focal extremity weakness. In the emergency department, the patient was afebrile hemodynamically stable.  Low-grade temperature of 9 9.8 F.  BMP showed sodium 134, potassium 3.4, WBC 7.7, hemoglobin 15.2, platelets 182,000.  CT of the brain showed atrophy without any acute findings.  Chest x-ray showed slight increase in interstitial markings.  Urinalysis was negative  for pyuria  Assessment/Plan:  Acute metabolic/toxic encephalopathy -patient is improving, but remains pleasantly confused -Concerned about Xanax withdrawal as well as use of Chantix vs post-ictal state -Serum B12--290 -TSH--0.939 -Folic acid--7.5 -EEG--suggestive of cortical dysfunction in left frontotemporal region which could be secondary to underlying structural abnormality, post-ictal state. No seizures or epileptiform discharges were seen  -MRI brain--remote left basal ganglia lacunar infarct -Urine drug screen--positive benzo -UA negative for pyuria -Personally reviewed EKG--sinus rhythm, nonspecific T wave change -Personally reviewed chest x-ray--slight increased interstitial markings without consolidation or edema -neurology consult appreciated-->concerning for seizure-->start keppra  Acute bronchitis -Viral respiratory panel--neg -COVID-19 negative  Low Back Pain -08/25/19 lumbar xray--Superior endplate depression of L3 vertebral body is noted concerning for fracture of indeterminate age -MRI lumbar spine -PT eval  Essential hypertension -Continue lisinopril -Hold HCTZ today  Hyperlipidemia -Continue statin  Generalized anxiety disorder -Continue Lexapro and alprazolam  Hypokalemia -Replete -Check magnesium--2.3  Hyponatremia -Likely secondary to HCTZ -Hold HCTZ temporarily -A.m. BMP        Status is: Observation  The patient will require care spanning > 2 midnights and should be moved to inpatient because: patient remains confused  Dispo: The patient is from: Home  Anticipated d/c is to: Home  Anticipated d/c date is: 1 day  Patient currently is not medically stable to d/c.        Family Communication:   Son updated at bedside 8/3  Consultants:  none  Code Status:  FULL  DVT Prophylaxis:   Lovenox   Procedures: As Listed in Progress Note  Above  Antibiotics: None  Subjective:   Objective: Vitals:   08/24/19 1436 08/24/19 1644 08/24/19 1939 08/25/19 0549  BP: 126/83 (!) 154/85 126/64 130/70  Pulse: 76 67 79 67  Resp: 19 19 18    Temp: 98.8 F (37.1 C) 98.6 F (37 C) 99.3 F (37.4 C) 98.7 F (37.1 C)  TempSrc: Oral Oral Oral Oral  SpO2: 92% 96% 97% (!) 89%  Weight:      Height:        Intake/Output Summary (Last 24 hours) at 08/25/2019 0618 Last data filed at 08/24/2019 1725 Gross per 24 hour  Intake --  Output 500 ml  Net -500 ml   Weight change:  Exam:   General:  Pt is alert, follows commands appropriately, not in acute distress  HEENT: No icterus, No thrush, No neck mass, [place]/AT  Cardiovascular: RRR, S1/S2, no rubs, no gallops  Respiratory: CTA bilaterally, no wheezing, no crackles, no rhonchi  Abdomen: Soft/+BS, non tender, non distended, no guarding  Extremities: No edema, No lymphangitis, No petechiae, No rashes, no synovitis   Data Reviewed: I have personally reviewed following labs and imaging studies Basic Metabolic Panel: Recent Labs  Lab 08/23/19 1324 08/24/19 0650 08/25/19 0443  NA 134* 137 137  K 3.4* 3.5 3.6  CL 97* 100 99  CO2 24 27 25   GLUCOSE 129* 108* 93  BUN 15 18 26*  CREATININE 0.97 1.15 1.10  CALCIUM 9.7 9.3 9.4  MG  --  2.3  --    Liver Function Tests: Recent Labs  Lab 08/23/19 1324  AST 24  ALT 19  ALKPHOS 89  BILITOT 1.2  PROT 7.9  ALBUMIN 5.1*   No results for input(s): LIPASE, AMYLASE in the last 168 hours. Recent Labs  Lab 08/24/19 0650  AMMONIA 22   Coagulation Profile: No results for input(s): INR, PROTIME in the last 168 hours. CBC: Recent Labs  Lab 08/23/19 1324  WBC 7.7  HGB 15.2  HCT 42.8  MCV 91.8  PLT 182   Cardiac Enzymes: No results for input(s): CKTOTAL, CKMB, CKMBINDEX, TROPONINI in the last 168 hours. BNP: Invalid input(s): POCBNP CBG: No results for input(s): GLUCAP in the last 168 hours. HbA1C: No results for  input(s): HGBA1C in the last 72 hours. Urine analysis:    Component Value Date/Time   COLORURINE STRAW (A) 08/23/2019 1204   APPEARANCEUR CLEAR 08/23/2019 1204   APPEARANCEUR Clear 05/19/2019 1501   LABSPEC 1.012 08/23/2019 1204   PHURINE 8.0 08/23/2019 1204   GLUCOSEU NEGATIVE 08/23/2019 1204   HGBUR SMALL (A) 08/23/2019 1204   BILIRUBINUR NEGATIVE 08/23/2019 1204   BILIRUBINUR Negative 05/19/2019 1501   KETONESUR 5 (A) 08/23/2019 1204   PROTEINUR NEGATIVE 08/23/2019 1204   NITRITE NEGATIVE 08/23/2019 1204   LEUKOCYTESUR NEGATIVE 08/23/2019 1204   Sepsis Labs: @LABRCNTIP (procalcitonin:4,lacticidven:4) ) Recent Results (from the past 240 hour(s))  SARS Coronavirus 2 by RT PCR (hospital order, performed in [facility] hospital lab) Nasopharyngeal Nasopharyngeal Swab     Status: None   Collection Time: 08/23/19  1:30 PM   Specimen: Nasopharyngeal Swab  Result Value Ref Range Status   SARS Coronavirus 2 NEGATIVE NEGATIVE Final    Comment: (NOTE) SARS-CoV-2 target nucleic acids are NOT DETECTED.  The SARS-CoV-2 RNA is generally detectable in upper and lower respiratory specimens during the acute phase of infection. The lowest concentration of SARS-CoV-2 viral copies this assay can detect is 250 copies / mL. A negative result does not preclude SARS-CoV-2 infection and should not be used as the  sole basis for treatment or other patient management decisions.  A negative result may occur with improper specimen collection / handling, submission of specimen other than nasopharyngeal swab, presence of viral mutation(s) within the areas targeted by this assay, and inadequate number of viral copies (<250 copies / mL). A negative result must be combined with clinical observations, patient history, and epidemiological information.  Fact Sheet for Patients:   BoilerBrush.com.cy  Fact Sheet for Healthcare Providers: https://pope.com/  This  test is not yet approved or  cleared by the [place] FDA and has been authorized for detection and/or diagnosis of SARS-CoV-2 by FDA under an Emergency Use Authorization (EUA).  This EUA will remain in effect (meaning this test can be used) for the duration of the COVID-19 declaration under Section 564(b)(1) of the Act, 21 U.S.C. section 360bbb-3(b)(1), unless the authorization is terminated or revoked sooner.  Performed at [facility], [address], [place], [place] [address]   Respiratory Panel by PCR     Status: None   Collection Time: 08/24/19  7:54 AM   Specimen: Nasopharyngeal Swab; Respiratory  Result Value Ref Range Status   Adenovirus NOT DETECTED NOT DETECTED Final   Coronavirus 229E NOT DETECTED NOT DETECTED Final    Comment: (NOTE) The Coronavirus on the Respiratory Panel, DOES NOT test for the novel  Coronavirus (2019 nCoV)    Coronavirus HKU1 NOT DETECTED NOT DETECTED Final   Coronavirus NL63 NOT DETECTED NOT DETECTED Final   Coronavirus OC43 NOT DETECTED NOT DETECTED Final   Metapneumovirus NOT DETECTED NOT DETECTED Final   Rhinovirus / Enterovirus NOT DETECTED NOT DETECTED Final   Influenza A NOT DETECTED NOT DETECTED Final   Influenza B NOT DETECTED NOT DETECTED Final   Parainfluenza Virus 1 NOT DETECTED NOT DETECTED Final   Parainfluenza Virus 2 NOT DETECTED NOT DETECTED Final   Parainfluenza Virus 3 NOT DETECTED NOT DETECTED Final   Parainfluenza Virus 4 NOT DETECTED NOT DETECTED Final   Respiratory Syncytial Virus NOT DETECTED NOT DETECTED Final   Bordetella pertussis NOT DETECTED NOT DETECTED Final   Chlamydophila pneumoniae NOT DETECTED NOT DETECTED Final   Mycoplasma pneumoniae NOT DETECTED NOT DETECTED Final    Comment: Performed at [facility], [address] [place], [place] [address]     Scheduled Meds: . ALPRAZolam  1 mg Oral BID  . enoxaparin (LOVENOX) injection  40 mg Subcutaneous Q24H  . escitalopram  10 mg Oral Daily  .  famotidine  20 mg Oral BID  . folic acid  1 mg Intravenous Daily  . lisinopril  10 mg Oral Daily  . simvastatin  20 mg Oral q1800  . tamsulosin  0.4 mg Oral Daily   Continuous Infusions:  Procedures/Studies: DG Chest 2 View  Result Date: 08/23/2019 CLINICAL DATA:  Hypoxia and altered mental status. EXAM: CHEST - 2 VIEW COMPARISON:  05/18/2013 FINDINGS: Upper normal heart size. Unchanged mediastinal contours with aortic atherosclerosis. Mild bronchial and interstitial thickening, increased from prior. No focal airspace disease, pleural effusion, or pneumothorax. No acute osseous abnormalities are seen. IMPRESSION: Mild bronchial and interstitial thickening, likely bronchitic and smoking related. The possibility of superimposed pulmonary edema is not excluded. Aortic Atherosclerosis (ICD10-I70.0). Electronically Signed   By: Narda Rutherford M.D.   On: 08/23/2019 17:51   CT HEAD WO CONTRAST  Result Date: 08/23/2019 CLINICAL DATA:  Mental status change. EXAM: CT HEAD WITHOUT CONTRAST TECHNIQUE: Contiguous axial images were obtained from the base of the skull through the vertex without  intravenous contrast. COMPARISON:  None. FINDINGS: Brain: Generalized parenchymal volume loss with commensurate dilatation of the ventricles and sulci. Mild chronic small vessel ischemic changes within the bilateral periventricular white matter regions. No mass, hemorrhage, edema or other evidence of acute parenchymal abnormality. No extra-axial hemorrhage. Vascular: Chronic calcified atherosclerotic changes of the large vessels at the skull base. No unexpected hyperdense vessel. Skull: Normal. Negative for fracture or focal lesion. Sinuses/Orbits: No acute finding. Other: None. IMPRESSION: 1. No acute findings. No intracranial mass, hemorrhage or edema. 2. Atrophy and chronic small vessel ischemic changes in the white matter. Electronically Signed   By: Bary Richard M.D.   On: 08/23/2019 12:46   MR BRAIN WO  CONTRAST  Result Date: 08/24/2019 CLINICAL DATA:  Mental status change. EXAM: MRI HEAD WITHOUT CONTRAST TECHNIQUE: Multiplanar, multiecho pulse sequences of the brain and surrounding structures were obtained without intravenous contrast. COMPARISON:  08/23/2019 head CT. FINDINGS: Brain: Diffuse parenchymal volume loss with ex vacuo dilatation. Scattered and confluent T2/FLAIR hyperintense foci involving the periventricular/subcortical white matter and pons are nonspecific however commonly associated with chronic microvascular ischemic changes. Sequela of remote left basal ganglia lacunar infarct. No acute infarct or intracranial hemorrhage. No midline shift, ventriculomegaly or extra-axial fluid collection. No mass lesion. Vascular: Normal flow voids. Skull and upper cervical spine: Normal marrow signal. Sinuses/Orbits: Normal orbits. Clear paranasal sinuses. No mastoid effusion. Other: None. IMPRESSION: No acute intracranial process. Small remote left basal ganglia lacunar infarct. Moderate cerebral atrophy. Mild chronic microvascular ischemic changes. Electronically Signed   By: Stana Bunting M.D.   On: 08/24/2019 10:26   EEG adult  Result Date: 08/24/2019 Charlsie Quest, MD     08/24/2019  5:00 PM Patient Name: KIE CALVIN MRN: [id number] Epilepsy Attending: Charlsie Quest Referring Physician/Provider: Dr. Onalee Hua Bodey Frizell Date: 08/24/2019 Duration: 23.27 mins Patient history: 74 year old male with altered mental status.  EEG to assess for seizures. Level of alertness: Awake AEDs during EEG study: Xanax Technical aspects: This EEG study was done with scalp electrodes positioned according to the 10-20 International system of electrode placement. Electrical activity was acquired at a sampling rate of 500Hz  and reviewed with a high frequency filter of 70Hz  and a low frequency filter of 1Hz . EEG data were recorded continuously and digitally stored. Description: The posterior dominant rhythm consists of 9 Hz  activity of moderate voltage (25-35 uV) seen predominantly in posterior head regions, symmetric and reactive to eye opening and eye closing. EEG showed continuous 3 to 6 Hz theta-delta slowing in left frontotemporal region. Hyperventilation and photic stimulation were not performed.   ABNORMALITY -Continous slow, left frontotemporal region IMPRESSION: This study is suggestive of cortical dysfunction in left frontotemporal region which could be secondary to underlying structural abnormality, post-ictal state. No seizures or epileptiform discharges were seen throughout the recording. Priyanka Cena Bruhn, DO  [facility]  If 7PM-7AM, please contact night-coverage [email or web address] Password [email or web address] 08/25/2019, 6:18 AM   LOS: 0 days

## 2019-08-25 NOTE — Plan of Care (Signed)
  Problem: Acute Rehab PT Goals(only PT should resolve) Goal: Pt Will Ambulate 08/25/2019 1435 by Reinaldo Berber B, PT Outcome: Progressing 08/25/2019 1435 by Reinaldo Berber B, PT Outcome: Progressing Flowsheets (Taken 08/25/2019 1435) Pt will Ambulate:  > 125 feet  Independently   Problem: Acute Rehab PT Goals(only PT should resolve) Goal: Pt/caregiver will Perform Home Exercise Program 08/25/2019 1435 by Reinaldo Berber B, PT Outcome: Progressing Flowsheets (Taken 08/25/2019 1435) Pt/caregiver will Perform Home Exercise Program:  For increased strengthening  For improved balance  Independently 08/25/2019 1435 by Awilda Bill, PT Outcome: Progressing   Tori Harrington Challenger PT, DPT 08/25/19, 2:36 PM [phone]

## 2019-08-25 NOTE — Plan of Care (Signed)

## 2019-08-25 NOTE — Discharge Summary (Signed)
Physician Discharge Summary  Eric Cook YIR:[id number] DOB: 1945/10/22 DOA: 08/23/2019  PCP: Raliegh Ip, DO  Admit date: 08/23/2019 Discharge date: 08/25/2019  Admitted From: Home Disposition:  Home   Recommendations for Outpatient Follow-up:  1. Follow up with PCP in 1-2 weeks 2. Please obtain BMP/CBC in one week    Discharge Condition: Stable CODE STATUS: FULL Diet recommendation: Heart Healthy    Brief/Interim Summary: 74 year old male with a history of generalized anxiety disorder, hypertension, hyperlipidemia, tobacco abuse presenting with confusion. The patient's son supplements the history. At baseline, the patient is "sharp"according to the patient's son. The patient lives by himself and is independent with all his activities of daily living. There has been no discernible decline in his mental or functional status according to the patient's son. The patient was last noted to be "normal" on 08/21/2019. However over the last 2 to 3 days, the family had noted some unusual text messages. They went to check on the patient, and the patient had word finding difficulties and was confused. Further history reveals that the patient has been receiving alprazolam with which the patient's son was unaware. The patient denies stopping it abruptly or overusing alprazolam. In addition, the son states that the patient has been intermittently on Chantix, but the patient states that he has not been taking it for over a month now. Patient denies any new medications or illicit drug use. He states that he quit smoking about a month ago. The patient has been complaining of cold-like symptoms including coughing and chest congestion. He denied any frank fevers, chills, chest pain, shortness breath, nausea, vomiting, diarrhea, abdominal pain, dysuria, hematuria. He has had a frontal headache but denied any neck pain, visual disturbance, focal extremity weakness. In the emergency  department, the patient was afebrile hemodynamically stable. Low-grade temperature of 9 9.8 F. BMP showed sodium 134, potassium 3.4, WBC 7.7, hemoglobin 15.2, platelets 182,000. CT of the brain showed atrophy without any acute findings. Chest x-ray showed slight increase in interstitial markings. Urinalysis was negative for pyuria   Discharge Diagnoses:  Acute metabolic/toxic encephalopathy -patient is improving, but remains pleasantly confused in early am 08/25/19 -neurology consult appreciated -after keppra IV and by later in afternoon, patient's mental status had returned back to baseline -Concerned about Xanax withdrawalas well as use of Chantix vs post-ictal state initially -likely due to seizure and post-ictal state -Serum B12--290 -TSH--0.939 -Folic acid--7.5 -EEG--suggestive of cortical dysfunction in left frontotemporal region which could be secondary to underlying structural abnormality, post-ictal state.No seizures or epileptiform discharges were seen  -MRI brain--remote left basal ganglia lacunar infarct -Urine drug screen--positive benzo -UA negative for pyuria -Personally reviewed EKG--sinus rhythm, nonspecific T wave change -Personally reviewed chest x-ray--slight increased interstitial markings without consolidation or edema -neurology consult appreciated-->concerning for seizure-->start keppra  Acute bronchitis -Viral respiratory panel--neg -COVID-19 negative  Low Back Pain/L3 Compression Fracture -08/25/19 lumbar xray--Superior endplate depression of L3 vertebral body is noted concerning for fracture of indeterminate age -MRI lumbar spine--Acute or subacute compression fracture of L3 with less than 50% loss of vertebral body height and no substantial retropulsion. -PT eval--no follow up needed -start vitamin D -TLSO brace for comfort -tramadol prn pain--PMP Aware reviewed  Essential hypertension -Continue lisinopril -Hold HCTZ today--restart after  d/c  Hyperlipidemia -Continue statin  Generalized anxiety disorder -Continue Lexapro and alprazolam  Hypokalemia -Replete -Check magnesium--2.3  Hyponatremia -Likely secondary to HCTZ -Hold HCTZ temporarily -improved  Discharge Instructions   Allergies as of 08/25/2019   No Known  Allergies     Medication List    STOP taking these medications   varenicline 1 MG tablet Commonly known as: CHANTIX     TAKE these medications   ALPRAZolam 1 MG tablet Commonly known as: XANAX Take 1 tablet twice daily if needed for anxiety for 1 month.  Then go down to 1/2 tablet in the morning and 1 tablet at bedtime if needed x1 month.  Then go down to 1/2 tablet twice daily.   cimetidine 400 MG tablet Commonly known as: TAGAMET Take 400 mg by mouth 2 (two) times daily.   escitalopram 10 MG tablet Commonly known as: Lexapro Take 1 tablet (10 mg total) by mouth daily.   levETIRAcetam 250 MG tablet Commonly known as: KEPPRA Take 1 tablet (250 mg total) by mouth 2 (two) times daily.   lisinopril-hydrochlorothiazide 10-12.5 MG tablet Commonly known as: ZESTORETIC Take 1 tablet by mouth daily.   simvastatin 20 MG tablet Commonly known as: ZOCOR Take 1 tablet (20 mg total) by mouth daily at 6 PM.   tamsulosin 0.4 MG Caps capsule Commonly known as: FLOMAX TAKE 1 CAPSULE EVERY DAY FOR URINARY SYMPTOMS   traMADol 50 MG tablet Commonly known as: ULTRAM Take 1 tablet (50 mg total) by mouth every 6 (six) hours as needed for moderate pain.   Vitamin D3 25 MCG tablet Commonly known as: Vitamin D Take 1 tablet (1,000 Units total) by mouth daily.       No Known Allergies  Consultations:  neurology   Procedures/Studies: DG Chest 2 View  Result Date: 08/23/2019 CLINICAL DATA:  Hypoxia and altered mental status. EXAM: CHEST - 2 VIEW COMPARISON:  05/18/2013 FINDINGS: Upper normal heart size. Unchanged mediastinal contours with aortic atherosclerosis. Mild bronchial and  interstitial thickening, increased from prior. No focal airspace disease, pleural effusion, or pneumothorax. No acute osseous abnormalities are seen. IMPRESSION: Mild bronchial and interstitial thickening, likely bronchitic and smoking related. The possibility of superimposed pulmonary edema is not excluded. Aortic Atherosclerosis (ICD10-I70.0). Electronically Signed   By: Narda Rutherford M.D.   On: 08/23/2019 17:51   DG Lumbar Spine 2-3 Views  Result Date: 08/25/2019 CLINICAL DATA:  Acute lower back pain without known injury. EXAM: LUMBAR SPINE - 2-3 VIEW COMPARISON:  None. FINDINGS: No spondylolisthesis is noted. Superior endplate depression of L3 vertebral body is noted concerning for fracture of indeterminate age. No other bony abnormality is noted. Mild degenerative disc disease is noted at L2-3 and L3-4. IMPRESSION: Superior endplate depression of L3 vertebral body is noted concerning for fracture of indeterminate age. MRI may be performed for further evaluation. Aortic Atherosclerosis (ICD10-I70.0). Electronically Signed   By: Lupita Raider M.D.   On: 08/25/2019 09:26   CT HEAD WO CONTRAST  Result Date: 08/23/2019 CLINICAL DATA:  Mental status change. EXAM: CT HEAD WITHOUT CONTRAST TECHNIQUE: Contiguous axial images were obtained from the base of the skull through the vertex without intravenous contrast. COMPARISON:  None. FINDINGS: Brain: Generalized parenchymal volume loss with commensurate dilatation of the ventricles and sulci. Mild chronic small vessel ischemic changes within the bilateral periventricular white matter regions. No mass, hemorrhage, edema or other evidence of acute parenchymal abnormality. No extra-axial hemorrhage. Vascular: Chronic calcified atherosclerotic changes of the large vessels at the skull base. No unexpected hyperdense vessel. Skull: Normal. Negative for fracture or focal lesion. Sinuses/Orbits: No acute finding. Other: None. IMPRESSION: 1. No acute findings. No  intracranial mass, hemorrhage or edema. 2. Atrophy and chronic small vessel ischemic changes in the  white matter. Electronically Signed   By: Bary Richard M.D.   On: 08/23/2019 12:46   MR BRAIN WO CONTRAST  Result Date: 08/24/2019 CLINICAL DATA:  Mental status change. EXAM: MRI HEAD WITHOUT CONTRAST TECHNIQUE: Multiplanar, multiecho pulse sequences of the brain and surrounding structures were obtained without intravenous contrast. COMPARISON:  08/23/2019 head CT. FINDINGS: Brain: Diffuse parenchymal volume loss with ex vacuo dilatation. Scattered and confluent T2/FLAIR hyperintense foci involving the periventricular/subcortical white matter and pons are nonspecific however commonly associated with chronic microvascular ischemic changes. Sequela of remote left basal ganglia lacunar infarct. No acute infarct or intracranial hemorrhage. No midline shift, ventriculomegaly or extra-axial fluid collection. No mass lesion. Vascular: Normal flow voids. Skull and upper cervical spine: Normal marrow signal. Sinuses/Orbits: Normal orbits. Clear paranasal sinuses. No mastoid effusion. Other: None. IMPRESSION: No acute intracranial process. Small remote left basal ganglia lacunar infarct. Moderate cerebral atrophy. Mild chronic microvascular ischemic changes. Electronically Signed   By: Stana Bunting M.D.   On: 08/24/2019 10:26   MR LUMBAR SPINE WO CONTRAST  Result Date: 08/25/2019 CLINICAL DATA:  Low back pain, L3 endplate depression on radiograph EXAM: MRI LUMBAR SPINE WITHOUT CONTRAST TECHNIQUE: Multiplanar, multisequence MR imaging of the lumbar spine was performed. No intravenous contrast was administered. COMPARISON:  Correlation made with same day radiograph. FINDINGS: Motion artifact is present. Segmentation: Standard. Alignment:  Anteroposterior alignment is maintained. Vertebrae: There is compression deformity of L3 with less than 50% loss of height at the superior endplate. Underlying marrow edema is  present. No substantial endplate retropulsion. Vertebral body heights and signal are otherwise unremarkable. Conus medullaris and cauda equina: Conus extends to the L1 level. Conus and cauda equina appear normal. There is a fatty filum terminale. Paraspinal and other soft tissues: Unremarkable. Disc levels: L1-L2:  No significant canal or foraminal stenosis. L2-L3:  Disc bulge.  No significant canal or foraminal stenosis. L3-L4: Disc bulge. No significant canal stenosis. Slight effacement of the lateral recesses. No significant right foraminal stenosis. Disc abuts the exiting left L3 nerve root without evidence of compression. L4-L5:  No significant canal or foraminal stenosis. L5-S1: Evidence of prior left laminectomy. Canal is decompressed. No significant foraminal stenosis. IMPRESSION: Motion degraded. Acute or subacute compression fracture of L3 with less than 50% loss of vertebral body height and no substantial retropulsion. Electronically Signed   By: Guadlupe Spanish M.D.   On: 08/25/2019 14:19   EEG adult  Result Date: 08/24/2019 Charlsie Quest, MD     08/24/2019  5:00 PM Patient Name: Eric Cook MRN: [id number] Epilepsy Attending: Charlsie Quest Referring Physician/Provider: Dr. Onalee Hua Sangita Zani Date: 08/24/2019 Duration: 23.27 mins Patient history: 74 year old male with altered mental status.  EEG to assess for seizures. Level of alertness: Awake AEDs during EEG study: Xanax Technical aspects: This EEG study was done with scalp electrodes positioned according to the 10-20 International system of electrode placement. Electrical activity was acquired at a sampling rate of 500Hz  and reviewed with a high frequency filter of 70Hz  and a low frequency filter of 1Hz . EEG data were recorded continuously and digitally stored. Description: The posterior dominant rhythm consists of 9 Hz activity of moderate voltage (25-35 uV) seen predominantly in posterior head regions, symmetric and reactive to eye opening and eye  closing. EEG showed continuous 3 to 6 Hz theta-delta slowing in left frontotemporal region. Hyperventilation and photic stimulation were not performed.   ABNORMALITY -Continous slow, left frontotemporal region IMPRESSION: This study is suggestive of cortical dysfunction in left  frontotemporal region which could be secondary to underlying structural abnormality, post-ictal state. No seizures or epileptiform discharges were seen throughout the recording. Charlsie Questriyanka O Yadav        Discharge Exam: Vitals:   08/25/19 0549 08/25/19 1541  BP: 130/70 (!) 112/59  Pulse: 67 66  Resp:  16  Temp: 98.7 F (37.1 C) 98.1 F (36.7 C)  SpO2: (!) 89% 92%   Vitals:   08/24/19 1644 08/24/19 1939 08/25/19 0549 08/25/19 1541  BP: (!) 154/85 126/64 130/70 (!) 112/59  Pulse: 67 79 67 66  Resp: 19 18  16   Temp: 98.6 F (37 C) 99.3 F (37.4 C) 98.7 F (37.1 C) 98.1 F (36.7 C)  TempSrc: Oral Oral Oral Oral  SpO2: 96% 97% (!) 89% 92%  Weight:      Height:        General: Pt is alert, awake, not in acute distress Cardiovascular: RRR, S1/S2 +, no rubs, no gallops Respiratory: CTA bilaterally, no wheezing, no rhonchi Abdominal: Soft, NT, ND, bowel sounds + Extremities: no edema, no cyanosis Neuro:  CN II-XII intact, strength 4/5 in RUE, RLE, strength 4/5 LUE, LLE; sensation intact bilateral; no dysmetria; babinski equivocal    The results of significant diagnostics from this hospitalization (including imaging, microbiology, ancillary and laboratory) are listed below for reference.    Significant Diagnostic Studies: DG Chest 2 View  Result Date: 08/23/2019 CLINICAL DATA:  Hypoxia and altered mental status. EXAM: CHEST - 2 VIEW COMPARISON:  05/18/2013 FINDINGS: Upper normal heart size. Unchanged mediastinal contours with aortic atherosclerosis. Mild bronchial and interstitial thickening, increased from prior. No focal airspace disease, pleural effusion, or pneumothorax. No acute osseous abnormalities  are seen. IMPRESSION: Mild bronchial and interstitial thickening, likely bronchitic and smoking related. The possibility of superimposed pulmonary edema is not excluded. Aortic Atherosclerosis (ICD10-I70.0). Electronically Signed   By: Narda RutherfordMelanie  Sanford M.D.   On: 08/23/2019 17:51   DG Lumbar Spine 2-3 Views  Result Date: 08/25/2019 CLINICAL DATA:  Acute lower back pain without known injury. EXAM: LUMBAR SPINE - 2-3 VIEW COMPARISON:  None. FINDINGS: No spondylolisthesis is noted. Superior endplate depression of L3 vertebral body is noted concerning for fracture of indeterminate age. No other bony abnormality is noted. Mild degenerative disc disease is noted at L2-3 and L3-4. IMPRESSION: Superior endplate depression of L3 vertebral body is noted concerning for fracture of indeterminate age. MRI may be performed for further evaluation. Aortic Atherosclerosis (ICD10-I70.0). Electronically Signed   By: Lupita RaiderJames  Green Jr M.D.   On: 08/25/2019 09:26   CT HEAD WO CONTRAST  Result Date: 08/23/2019 CLINICAL DATA:  Mental status change. EXAM: CT HEAD WITHOUT CONTRAST TECHNIQUE: Contiguous axial images were obtained from the base of the skull through the vertex without intravenous contrast. COMPARISON:  None. FINDINGS: Brain: Generalized parenchymal volume loss with commensurate dilatation of the ventricles and sulci. Mild chronic small vessel ischemic changes within the bilateral periventricular white matter regions. No mass, hemorrhage, edema or other evidence of acute parenchymal abnormality. No extra-axial hemorrhage. Vascular: Chronic calcified atherosclerotic changes of the large vessels at the skull base. No unexpected hyperdense vessel. Skull: Normal. Negative for fracture or focal lesion. Sinuses/Orbits: No acute finding. Other: None. IMPRESSION: 1. No acute findings. No intracranial mass, hemorrhage or edema. 2. Atrophy and chronic small vessel ischemic changes in the white matter. Electronically Signed   By: Bary RichardStan   Maynard M.D.   On: 08/23/2019 12:46   MR BRAIN WO CONTRAST  Result Date: 08/24/2019 CLINICAL DATA:  Mental status change. EXAM: MRI HEAD WITHOUT CONTRAST TECHNIQUE: Multiplanar, multiecho pulse sequences of the brain and surrounding structures were obtained without intravenous contrast. COMPARISON:  08/23/2019 head CT. FINDINGS: Brain: Diffuse parenchymal volume loss with ex vacuo dilatation. Scattered and confluent T2/FLAIR hyperintense foci involving the periventricular/subcortical white matter and pons are nonspecific however commonly associated with chronic microvascular ischemic changes. Sequela of remote left basal ganglia lacunar infarct. No acute infarct or intracranial hemorrhage. No midline shift, ventriculomegaly or extra-axial fluid collection. No mass lesion. Vascular: Normal flow voids. Skull and upper cervical spine: Normal marrow signal. Sinuses/Orbits: Normal orbits. Clear paranasal sinuses. No mastoid effusion. Other: None. IMPRESSION: No acute intracranial process. Small remote left basal ganglia lacunar infarct. Moderate cerebral atrophy. Mild chronic microvascular ischemic changes. Electronically Signed   By: Stana Bunting M.D.   On: 08/24/2019 10:26   MR LUMBAR SPINE WO CONTRAST  Result Date: 08/25/2019 CLINICAL DATA:  Low back pain, L3 endplate depression on radiograph EXAM: MRI LUMBAR SPINE WITHOUT CONTRAST TECHNIQUE: Multiplanar, multisequence MR imaging of the lumbar spine was performed. No intravenous contrast was administered. COMPARISON:  Correlation made with same day radiograph. FINDINGS: Motion artifact is present. Segmentation: Standard. Alignment:  Anteroposterior alignment is maintained. Vertebrae: There is compression deformity of L3 with less than 50% loss of height at the superior endplate. Underlying marrow edema is present. No substantial endplate retropulsion. Vertebral body heights and signal are otherwise unremarkable. Conus medullaris and cauda equina: Conus  extends to the L1 level. Conus and cauda equina appear normal. There is a fatty filum terminale. Paraspinal and other soft tissues: Unremarkable. Disc levels: L1-L2:  No significant canal or foraminal stenosis. L2-L3:  Disc bulge.  No significant canal or foraminal stenosis. L3-L4: Disc bulge. No significant canal stenosis. Slight effacement of the lateral recesses. No significant right foraminal stenosis. Disc abuts the exiting left L3 nerve root without evidence of compression. L4-L5:  No significant canal or foraminal stenosis. L5-S1: Evidence of prior left laminectomy. Canal is decompressed. No significant foraminal stenosis. IMPRESSION: Motion degraded. Acute or subacute compression fracture of L3 with less than 50% loss of vertebral body height and no substantial retropulsion. Electronically Signed   By: Guadlupe Spanish M.D.   On: 08/25/2019 14:19   EEG adult  Result Date: 08/24/2019 Charlsie Quest, MD     08/24/2019  5:00 PM Patient Name: Eric Cook MRN: [id number] Epilepsy Attending: Charlsie Quest Referring Physician/Provider: Dr. Onalee Hua Jaekwon Mcclune Date: 08/24/2019 Duration: 23.27 mins Patient history: 74 year old male with altered mental status.  EEG to assess for seizures. Level of alertness: Awake AEDs during EEG study: Xanax Technical aspects: This EEG study was done with scalp electrodes positioned according to the 10-20 International system of electrode placement. Electrical activity was acquired at a sampling rate of  and reviewed with a high frequency filter of  and a low frequency filter of . EEG data were recorded continuously and digitally stored. Description: The posterior dominant rhythm consists of 9 Hz activity of moderate voltage (25-35 uV) seen predominantly in posterior head regions, symmetric and reactive to eye opening and eye closing. EEG showed continuous 3 to 6 Hz theta-delta slowing in left frontotemporal region. Hyperventilation and photic stimulation were not performed.    ABNORMALITY -Continous slow, left frontotemporal region IMPRESSION: This study is suggestive of cortical dysfunction in left frontotemporal region which could be secondary to underlying structural abnormality, post-ictal state. No seizures or epileptiform discharges were seen throughout the recording. Priyanka Annabelle Harman  Microbiology: Recent Results (from the past 240 hour(s))  SARS Coronavirus 2 by RT PCR (hospital order, performed in [facility] hospital lab) Nasopharyngeal Nasopharyngeal Swab     Status: None   Collection Time: 08/23/19  1:30 PM   Specimen: Nasopharyngeal Swab  Result Value Ref Range Status   SARS Coronavirus 2 NEGATIVE NEGATIVE Final    Comment: (NOTE) SARS-CoV-2 target nucleic acids are NOT DETECTED.  The SARS-CoV-2 RNA is generally detectable in upper and lower respiratory specimens during the acute phase of infection. The lowest concentration of SARS-CoV-2 viral copies this assay can detect is 250 copies / mL. A negative result does not preclude SARS-CoV-2 infection and should not be used as the sole basis for treatment or other patient management decisions.  A negative result may occur with improper specimen collection / handling, submission of specimen other than nasopharyngeal swab, presence of viral mutation(s) within the areas targeted by this assay, and inadequate number of viral copies (<250 copies / mL). A negative result must be combined with clinical observations, patient history, and epidemiological information.  Fact Sheet for Patients:   BoilerBrush.com.cy  Fact Sheet for Healthcare Providers: https://pope.com/  This test is not yet approved or  cleared by the [place] FDA and has been authorized for detection and/or diagnosis of SARS-CoV-2 by FDA under an Emergency Use Authorization (EUA).  This EUA will remain in effect (meaning this test can be used) for the duration of the COVID-19  declaration under Section 564(b)(1) of the Act, 21 U.S.C. section 360bbb-3(b)(1), unless the authorization is terminated or revoked sooner.  Performed at [facility], [address] [place], [place] [address]   Respiratory Panel by PCR     Status: None   Collection Time: 08/24/19  7:54 AM   Specimen: Nasopharyngeal Swab; Respiratory  Result Value Ref Range Status   Adenovirus NOT DETECTED NOT DETECTED Final   Coronavirus 229E NOT DETECTED NOT DETECTED Final    Comment: (NOTE) The Coronavirus on the Respiratory Panel, DOES NOT test for the novel  Coronavirus (2019 nCoV)    Coronavirus HKU1 NOT DETECTED NOT DETECTED Final   Coronavirus NL63 NOT DETECTED NOT DETECTED Final   Coronavirus OC43 NOT DETECTED NOT DETECTED Final   Metapneumovirus NOT DETECTED NOT DETECTED Final   Rhinovirus / Enterovirus NOT DETECTED NOT DETECTED Final   Influenza A NOT DETECTED NOT DETECTED Final   Influenza B NOT DETECTED NOT DETECTED Final   Parainfluenza Virus 1 NOT DETECTED NOT DETECTED Final   Parainfluenza Virus 2 NOT DETECTED NOT DETECTED Final   Parainfluenza Virus 3 NOT DETECTED NOT DETECTED Final   Parainfluenza Virus 4 NOT DETECTED NOT DETECTED Final   Respiratory Syncytial Virus NOT DETECTED NOT DETECTED Final   Bordetella pertussis NOT DETECTED NOT DETECTED Final   Chlamydophila pneumoniae NOT DETECTED NOT DETECTED Final   Mycoplasma pneumoniae NOT DETECTED NOT DETECTED Final    Comment: Performed at [facility], [address], [place], [place] [address]     Labs: Basic Metabolic Panel: Recent Labs  Lab 08/23/19 1324 08/23/19 1324 08/24/19 0650 08/25/19 0443  NA 134*  --  137 137  K 3.4*   < > 3.5 3.6  CL 97*  --  100 99  CO2 24  --  27 25  GLUCOSE 129*  --  108* 93  BUN 15  --  18 26*  CREATININE 0.97  --  1.15 1.10  CALCIUM 9.7  --  9.3 9.4  MG  --   --  2.3  --    < > = values in this interval not displayed.   Liver Function Tests: Recent Labs  Lab  08/23/19 1324  AST 24  ALT 19  ALKPHOS 89  BILITOT 1.2  PROT 7.9  ALBUMIN 5.1*   No results for input(s): LIPASE, AMYLASE in the last 168 hours. Recent Labs  Lab 08/24/19 0650  AMMONIA 22   CBC: Recent Labs  Lab 08/23/19 1324  WBC 7.7  HGB 15.2  HCT 42.8  MCV 91.8  PLT 182   Cardiac Enzymes: No results for input(s): CKTOTAL, CKMB, CKMBINDEX, TROPONINI in the last 168 hours. BNP: Invalid input(s): POCBNP CBG: No results for input(s): GLUCAP in the last 168 hours.  Time coordinating discharge:  36 minutes  Signed:  Catarina Hartshorn, DO [facility] Pager: [phone] 08/25/2019, 4:29 PM

## 2019-08-25 NOTE — Progress Notes (Signed)
Orthopedic tech came by and applied brace/educated the pt and his son.  AVS papers were given, education provided, all questions are answered. Pt DC home

## 2019-08-25 NOTE — Discharge Instructions (Signed)
Acute Back Pain, Adult Acute back pain is sudden and usually short-lived. It is often caused by an injury to the muscles and tissues in the back. The injury may result from:  A muscle or ligament getting overstretched or torn (strained). Ligaments are tissues that connect bones to each other. Lifting something improperly can cause a back strain.  Wear and tear (degeneration) of the spinal disks. Spinal disks are circular tissue that provides cushioning between the bones of the spine (vertebrae).  Twisting motions, such as while playing sports or doing yard work.  A hit to the back.  Arthritis. You may have a physical exam, lab tests, and imaging tests to find the cause of your pain. Acute back pain usually goes away with rest and home care. Follow these instructions at home: Managing pain, stiffness, and swelling  Take over-the-counter and prescription medicines only as told by your health care provider.  Your health care provider may recommend applying ice during the first 24-48 hours after your pain starts. To do this: ? Put ice in a plastic bag. ? Place a towel between your skin and the bag. ? Leave the ice on for 20 minutes, 2-3 times a day.  If directed, apply heat to the affected area as often as told by your health care provider. Use the heat source that your health care provider recommends, such as a moist heat pack or a heating pad. ? Place a towel between your skin and the heat source. ? Leave the heat on for 20-30 minutes. ? Remove the heat if your skin turns bright red. This is especially important if you are unable to feel pain, heat, or cold. You have a greater risk of getting burned. Activity   Do not stay in bed. Staying in bed for more than 1-2 days can delay your recovery.  Sit up and stand up straight. Avoid leaning forward when you sit, or hunching over when you stand. ? If you work at a desk, sit close to it so you do not need to lean over. Keep your chin tucked  in. Keep your neck drawn back, and keep your elbows bent at a right angle. Your arms should look like the letter "L." ? Sit high and close to the steering wheel when you drive. Add lower back (lumbar) support to your car seat, if needed.  Take short walks on even surfaces as soon as you are able. Try to increase the length of time you walk each day.  Do not sit, drive, or stand in one place for more than 30 minutes at a time. Sitting or standing for long periods of time can put stress on your back.  Do not drive or use heavy machinery while taking prescription pain medicine.  Use proper lifting techniques. When you bend and lift, use positions that put less stress on your back: ? Bend your knees. ? Keep the load close to your body. ? Avoid twisting.  Exercise regularly as told by your health care provider. Exercising helps your back heal faster and helps prevent back injuries by keeping muscles strong and flexible.  Work with a physical therapist to make a safe exercise program, as recommended by your health care provider. Do any exercises as told by your physical therapist. Lifestyle  Maintain a healthy weight. Extra weight puts stress on your back and makes it difficult to have good posture.  Avoid activities or situations that make you feel anxious or stressed. Stress and anxiety increase muscle   tension and can make back pain worse. Learn ways to manage anxiety and stress, such as through exercise. General instructions  Sleep on a firm mattress in a comfortable position. Try lying on your side with your knees slightly bent. If you lie on your back, put a pillow under your knees.  Follow your treatment plan as told by your health care provider. This may include: ? Cognitive or behavioral therapy. ? Acupuncture or massage therapy. ? Meditation or yoga. Contact a health care provider if:  You have pain that is not relieved with rest or medicine.  You have increasing pain going down  into your legs or buttocks.  Your pain does not improve after 2 weeks.  You have pain at night.  You lose weight without trying.  You have a fever or chills. Get help right away if:  You develop new bowel or bladder control problems.  You have unusual weakness or numbness in your arms or legs.  You develop nausea or vomiting.  You develop abdominal pain.  You feel faint. Summary  Acute back pain is sudden and usually short-lived.  Use proper lifting techniques. When you bend and lift, use positions that put less stress on your back.  Take over-the-counter and prescription medicines and apply heat or ice as directed by your health care provider. This information is not intended to replace advice given to you by your health care provider. Make sure you discuss any questions you have with your health care provider. Document Revised: 04/29/2018 Document Reviewed: 08/22/2016 Elsevier Patient Education  2020 [employer]   Chronic Back Pain When back pain lasts longer than 3 months, it is called chronic back pain.The cause of your back pain may not be known. Some common causes include:  Wear and tear (degenerative disease) of the bones, ligaments, or disks in your back.  Inflammation and stiffness in your back (arthritis). People who have chronic back pain often go through certain periods in which the pain is more intense (flare-ups). Many people can learn to manage the pain with home care. Follow these instructions at home: Pay attention to any changes in your symptoms. Take these actions to help with your pain: Activity   Avoid bending and other activities that make the problem worse.  Maintain a proper position when standing or sitting: ? When standing, keep your upper back and neck straight, with your shoulders pulled back. Avoid slouching. ? When sitting, keep your back straight and relax your shoulders. Do not round your shoulders or pull them backward.  Do not  sit or stand in one place for long periods of time.  Take brief periods of rest throughout the day. This will reduce your pain. Resting in a lying or standing position is usually better than sitting to rest.  When you are resting for longer periods, mix in some mild activity or stretching between periods of rest. This will help to prevent stiffness and pain.  Get regular exercise. Ask your health care provider what activities are safe for you.  Do not lift anything that is heavier than 10 lb (4.5 kg). Always use proper lifting technique, which includes: ? Bending your knees. ? Keeping the load close to your body. ? Avoiding twisting.  Sleep on a firm mattress in a comfortable position. Try lying on your side with your knees slightly bent. If you lie on your back, put a pillow under your knees. Managing pain  If directed, apply ice to the painful area. Your health  care provider may recommend applying ice during the first 24-48 hours after a flare-up begins. ? Put ice in a plastic bag. ? Place a towel between your skin and the bag. ? Leave the ice on for 20 minutes, 2-3 times per day.  If directed, apply heat to the affected area as often as told by your health care provider. Use the heat source that your health care provider recommends, such as a moist heat pack or a heating pad. ? Place a towel between your skin and the heat source. ? Leave the heat on for 20-30 minutes. ? Remove the heat if your skin turns bright red. This is especially important if you are unable to feel pain, heat, or cold. You may have a greater risk of getting burned.  Try soaking in a warm tub.  Take over-the-counter and prescription medicines only as told by your health care provider.  Keep all follow-up visits as told by your health care provider. This is important. Contact a health care provider if:  You have pain that is not relieved with rest or medicine. Get help right away if:  You have weakness or  numbness in one or both of your legs or feet.  You have trouble controlling your bladder or your bowels.  You have nausea or vomiting.  You have pain in your abdomen.  You have shortness of breath or you faint. This information is not intended to replace advice given to you by your health care provider. Make sure you discuss any questions you have with your health care provider. Document Revised: 05/01/2018 Document Reviewed: 07/18/2016 Elsevier Patient Education  2020 [employer]  Seizure, Adult A seizure is a sudden burst of abnormal electrical activity in the brain. Seizures usually last from 30 seconds to 2 minutes. They can cause many different symptoms. Usually, seizures are not harmful unless they last a long time. What are the causes? Common causes of this condition include:  Fever or infection.  Conditions that affect the brain, such as: ? A brain abnormality that you were born with. ? A brain or head injury. ? Bleeding in the brain. ? A tumor. ? Stroke. ? Brain disorders such as autism or cerebral palsy.  Low blood sugar.  Conditions that are passed from parent to child (are inherited).  Problems with substances, such as: ? Having a reaction to a drug or a medicine. ? Suddenly stopping the use of a substance (withdrawal). In some cases, the cause may not be known. A person who has repeated seizures over time without a clear cause has a condition called epilepsy. What increases the risk? You are more likely to get this condition if you have:  A family history of epilepsy.  Had a seizure in the past.  A brain disorder.  A history of head injury, lack of oxygen at birth, or strokes. What are the signs or symptoms? There are many types of seizures. The symptoms vary depending on the type of seizure you have. Examples of symptoms during a seizure include:  Shaking (convulsions).  Stiffness in the body.  Passing out (losing consciousness).  Head  nodding.  Staring.  Not responding to sound or touch.  Loss of bladder control and bowel control. Some people have symptoms right before and right after a seizure happens. Symptoms before a seizure may include:  Fear.  Worry (anxiety).  Feeling like you may vomit (nauseous).  Feeling like the room is spinning (vertigo).  Feeling like you saw or  heard something before (dj vu).  Odd tastes or smells.  Changes in how you see. You may see flashing lights or spots. Symptoms after a seizure happens can include:  Confusion.  Sleepiness.  Headache.  Weakness on one side of the body. How is this treated? Most seizures will stop on their own in under 5 minutes. In these cases, no treatment is needed. Seizures that last longer than 5 minutes will usually need treatment. Treatment can include:  Medicines given through an IV tube.  Avoiding things that are known to cause your seizures. These can include medicines that you take for another condition.  Medicines to treat epilepsy.  Surgery to stop the seizures. This may be needed if medicines do not help. Follow these instructions at home: Medicines  Take over-the-counter and prescription medicines only as told by your doctor.  Do not eat or drink anything that may keep your medicine from working, such as alcohol. Activity  Do not do any activities that would be dangerous if you had another seizure, like driving or swimming. Wait until your doctor says it is safe for you to do them.  If you live in the [place] ask your local DMV (department of motor vehicles) when you can drive.  Get plenty of rest. Teaching others Teach friends and family what to do when you have a seizure. They should:  Lay you on the ground.  Protect your head and body.  Loosen any tight clothing around your neck.  Turn you on your side.  Not hold you down.  Not put anything into your mouth.  Know whether or not you need emergency  care.  Stay with you until you are better.  General instructions  Contact your doctor each time you have a seizure.  Avoid anything that gives you seizures.  Keep a seizure diary. Write down: ? What you think caused each seizure. ? What you remember about each seizure.  Keep all follow-up visits as told by your doctor. This is important. Contact a doctor if:  You have another seizure.  You have seizures more often.  There is any change in what happens during your seizures.  You keep having seizures with treatment.  You have symptoms of being sick or having an infection. Get help right away if:  You have a seizure that: ? Lasts longer than 5 minutes. ? Is different than seizures you had before. ? Makes it harder to breathe. ? Happens after you hurt your head.  You have any of these symptoms after a seizure: ? Not being able to speak. ? Not being able to use a part of your body. ? Confusion. ? A bad headache.  You have two or more seizures in a row.  You do not wake up right after a seizure.  You get hurt during a seizure. These symptoms may be an emergency. Do not wait to see if the symptoms will go away. Get medical help right away. Call your local emergency services (911 in the [place] Do not drive yourself to the hospital. Summary  Seizures usually last from 30 seconds to 2 minutes. Usually, they are not harmful unless they last a long time.  Do not eat or drink anything that may keep your medicine from working, such as alcohol.  Teach friends and family what to do when you have a seizure.  Contact your doctor each time you have a seizure. This information is not intended to replace advice given to you by your  health care provider. Make sure you discuss any questions you have with your health care provider. Document Revised: 03/28/2018 Document Reviewed: 03/28/2018 Elsevier Patient Education  2020 [employer].

## 2019-08-25 NOTE — Evaluation (Signed)
Physical Therapy Evaluation Patient Details Name: JAHEL WAVRA MRN: [id number] DOB: 1945/08/31 Today's Date: 08/25/2019   History of Present Illness  74 y.o. male with medical history significant of HTN, HLD, BPH, GAD who presents with confusion, altered mental status noted by family members in the past 2-3 days. Pt reports dull headache; Denies acute, abrupt, thunderclap type headache. No neck pain or stiffness. No fever or chills. Pt denies change in speech or vision. No numbness/weakness. Denies trauma, fall. EMS reports confusion and that gait seemed unsteady. Pt unsure if similar symptoms in past. No chest pain or sob. No abd pain or nvd  Clinical Impression  Pt admitted with above diagnosis. Pt with equal and strong BLE, no deficits with heel to shin test bilaterally, and denies numbness/tingling throughout BLE. Pt with drifting R and L when ambulating around room without reason, pt denies noticing, no significant improvement with cues, no LOB or physical assistance. Pt adamant this is his baseline and he is "a little unsteady" because he has been in bed at the hospital a few days. Pt A&O x4, but does go on tangents requiring redirection and is easily distracted by noises in hallway requiring redirection. Pt currently with functional limitations due to the deficits listed below (see PT Problem List). Pt will benefit from skilled PT to increase their independence and safety with mobility to allow discharge to the venue listed below.        Follow Up Recommendations No PT follow up;Supervision - Intermittent    Equipment Recommendations  None recommended by PT    Recommendations for Other Services       Precautions / Restrictions Precautions Precautions: Fall Restrictions Weight Bearing Restrictions: No      Mobility  Bed Mobility Overal bed mobility: Independent     Transfers Overall transfer level: Modified independent Equipment used: None  General transfer comment: pt  slightly impulsive, occasional posterior weight upon rising but shifts weight into balls of feet without cues once in standing  Ambulation/Gait Ambulation/Gait assistance: Supervision Gait Distance (Feet): 50 Feet Assistive device: None Gait Pattern/deviations: WFL(Within Functional Limits);Drifts right/left Gait velocity: WNL   General Gait Details: occasional drift R and L without pt realizing, good bil foot clearance, equal bil step length, easily distracted, no loss of balance or physical assistance required  Stairs            Wheelchair Mobility    Modified Rankin (Stroke Patients Only)       Balance Overall balance assessment: Mild deficits observed, not formally tested        Pertinent Vitals/Pain Pain Assessment: No/denies pain    Home Living Family/patient expects to be discharged to:: Private residence Living Arrangements: Alone Available Help at Discharge: Family;Available PRN/intermittently Type of Home: House Home Access: Level entry (from garage)     Home Layout: One level Home Equipment: None      Prior Function Level of Independence: Independent         Comments: Pt reports independent with ADLs, community ambulator, independent with yard work, drives, "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 Pt denies falls.     Hand Dominance   Dominant Hand: Right    Extremity/Trunk Assessment   Upper Extremity Assessment Upper Extremity Assessment: Overall WFL for tasks assessed    Lower Extremity Assessment Lower Extremity Assessment: Overall WFL for tasks assessed (AROM WNL, strength 4+/5, symmetrical, denies numbness/tingling, heel to shin test symmetrical without deficits bil)    Cervical / Trunk Assessment Cervical / Trunk  Assessment: Normal  Communication   Communication: No difficulties  Cognition Arousal/Alertness: Awake/alert Behavior During Therapy: WFL for tasks assessed/performed Overall Cognitive Status: Within  Functional Limits for tasks assessed  General Comments: Pt occasionally goes off on tangents or misunderstands question, but able to redirect appropriately.      General Comments      Exercises     Assessment/Plan    PT Assessment Patient needs continued PT services  PT Problem List Decreased activity tolerance;Decreased balance;Decreased safety awareness;Pain       PT Treatment Interventions DME instruction;Gait training;Functional mobility training;Therapeutic activities;Therapeutic exercise;Balance training;Neuromuscular re-education;Patient/family education    PT Goals (Current goals can be found in the Care Plan section)  Acute Rehab PT Goals Patient Stated Goal: go home PT Goal Formulation: With patient Time For Goal Achievement: 08/28/19 Potential to Achieve Goals: Good    Frequency Min 3X/week   Barriers to discharge        Co-evaluation               AM-PAC PT "6 Clicks" Mobility  Outcome Measure Help needed turning from your back to your side while in a flat bed without using bedrails?: None Help needed moving from lying on your back to sitting on the side of a flat bed without using bedrails?: None Help needed moving to and from a bed to a chair (including a wheelchair)?: None Help needed standing up from a chair using your arms (e.g., wheelchair or bedside chair)?: None Help needed to walk in hospital room?: A Little Help needed climbing 3-5 steps with a railing? : A Little 6 Click Score: 22    End of Session   Activity Tolerance: Patient tolerated treatment well Patient left: in bed;with call bell/phone within reach;with bed alarm set;with nursing/sitter in room Nurse Communication: Mobility status PT Visit Diagnosis: Other abnormalities of gait and mobility (R26.89)    Time: 6759-1638 PT Time Calculation (min) (ACUTE ONLY): 19 min   Charges:   PT Evaluation $PT Eval Low Complexity: 1 Low           Tori Arayna Illescas PT, DPT 08/25/19,  2:32 PM [phone]

## 2019-08-26 ENCOUNTER — Telehealth: Payer: Self-pay | Admitting: *Deleted

## 2019-08-26 ENCOUNTER — Encounter: Payer: Self-pay | Admitting: Family Medicine

## 2019-08-26 NOTE — Telephone Encounter (Signed)
TRANSITIONAL CARE MANAGEMENT TELEPHONE OUTREACH NOTE   Contact Date: 08/26/2019 Contacted By: Hilton Cork, LPN   DISCHARGE INFORMATION Date of Discharge:08/25/19 [facility] Discharge Diagnosis:Delirium  Outpatient Follow Up Recommendations  Recommendations for Outpatient Follow-up:  1. Follow up with PCP in 1-2 weeks 2. Please obtain BMP/CBC in one week  AUGUSTEN LIPKIN is a male primary care patient of Raliegh Ip, DO. An outgoing telephone call was made today and I spoke with patient.  Mr. Mcnease condition(s) and treatment(s) were discussed. An opportunity to ask questions was provided and all were answered or forwarded as appropriate.    ACTIVITIES OF DAILY LIVING  LARSON LIMONES lives alone and he can perform ADLs independently. his primary caregiver is Haley Roza, son. he is able to depend on his primary caregiver(s) for consistent help. Transportation to appointments, to pick up medications, and to run errands is not a problem.  (Consider referral to [facility] if transportation or a consistent caregiver is a problem)   Fall Risk Fall Risk  07/31/2019 06/23/2019  Falls in the past year? 0 0    low Fall Risk   Home Modifications/Assistive Devices Wheelchair: No Cane: No Ramp: No Bedside Toilet: No Hospital Bed:  No Other:    Home Health Services he is not receiving home health  services.     MEDICATION RECONCILIATION  Mr. Taboada has been able to pick-up all prescribed discharge medications from the pharmacy.   A post discharge medication reconciliation was performed and the complete medication list was reviewed with the patient/caregiver and is current as of 08/26/2019. Changes highlighted below.  Discontinued Medications  Chantix  Current Medication List Allergies as of 08/26/2019   No Known Allergies     Medication List       Accurate as of August 26, 2019  9:43 AM. If you have any questions, ask your nurse  or doctor.        ALPRAZolam 1 MG tablet Commonly known as: XANAX Take 1 tablet twice daily if needed for anxiety for 1 month.  Then go down to 1/2 tablet in the morning and 1 tablet at bedtime if needed x1 month.  Then go down to 1/2 tablet twice daily.   cimetidine 400 MG tablet Commonly known as: TAGAMET Take 400 mg by mouth 2 (two) times daily.   escitalopram 10 MG tablet Commonly known as: Lexapro Take 1 tablet (10 mg total) by mouth daily.   levETIRAcetam 250 MG tablet Commonly known as: KEPPRA Take 1 tablet (250 mg total) by mouth 2 (two) times daily.   lisinopril-hydrochlorothiazide 10-12.5 MG tablet Commonly known as: ZESTORETIC Take 1 tablet by mouth daily.   simvastatin 20 MG tablet Commonly known as: ZOCOR Take 1 tablet (20 mg total) by mouth daily at 6 PM.   tamsulosin 0.4 MG Caps capsule Commonly known as: FLOMAX TAKE 1 CAPSULE EVERY DAY FOR URINARY SYMPTOMS   traMADol 50 MG tablet Commonly known as: ULTRAM Take 1 tablet (50 mg total) by mouth every 6 (six) hours as needed for moderate pain.   Vitamin D3 25 MCG tablet Commonly known as: Vitamin D Take 1 tablet (1,000 Units total) by mouth daily.        PATIENT EDUCATION & FOLLOW-UP PLAN  An appointment for Transitional Care Management is scheduled with Jannifer Rodney on 09/08/19 at 3:55PM  Take all medications as prescribed  Contact our office by calling [phone] if you have any questions or concerns

## 2019-08-27 ENCOUNTER — Encounter: Payer: Self-pay | Admitting: Family Medicine

## 2019-08-27 ENCOUNTER — Telehealth: Payer: Self-pay | Admitting: Family Medicine

## 2019-08-27 NOTE — Telephone Encounter (Signed)
Patients son aware 

## 2019-08-27 NOTE — Telephone Encounter (Signed)
This is fine. If he has not taken, he should not restart given age and recent hospitalization. Per Dr. Reece Agar note he was weaning off anyway.

## 2019-09-08 ENCOUNTER — Other Ambulatory Visit: Payer: Self-pay

## 2019-09-08 ENCOUNTER — Ambulatory Visit (INDEPENDENT_AMBULATORY_CARE_PROVIDER_SITE_OTHER): Payer: Medicare HMO | Admitting: Family

## 2019-09-08 ENCOUNTER — Encounter: Payer: Self-pay | Admitting: Family

## 2019-09-08 VITALS — BP 119/75 | HR 83 | Temp 97.8°F | Ht 69.0 in | Wt 187.2 lb

## 2019-09-08 DIAGNOSIS — S32030D Wedge compression fracture of third lumbar vertebra, subsequent encounter for fracture with routine healing: Secondary | ICD-10-CM

## 2019-09-08 DIAGNOSIS — F411 Generalized anxiety disorder: Secondary | ICD-10-CM | POA: Diagnosis not present

## 2019-09-08 DIAGNOSIS — G9341 Metabolic encephalopathy: Secondary | ICD-10-CM | POA: Diagnosis not present

## 2019-09-08 DIAGNOSIS — S32030A Wedge compression fracture of third lumbar vertebra, initial encounter for closed fracture: Secondary | ICD-10-CM | POA: Diagnosis not present

## 2019-09-08 DIAGNOSIS — Z09 Encounter for follow-up examination after completed treatment for conditions other than malignant neoplasm: Secondary | ICD-10-CM

## 2019-09-08 NOTE — Progress Notes (Signed)
Subjective:    Patient ID: Eric Cook, male    DOB: January 23, 1945, 74 y.o.   MRN: [id number]  Chief Complaint  Patient presents with  . Transitions Of Care    Forestine Na wants to ask about recall on chantix    HPI Today's visit was for Transitional Care Management.  The patient was discharged from [facility] on 08/25/19 with a primary diagnosis of Delirium.   Contact with the patient and/or caregiver, by a clinical staff member, was made on 08/26/19 and was documented as a telephone encounter within the EMR.  Through chart review and discussion with the patient I have determined that management of their condition is of moderate complexity.    Pt went to the ED on 08/25/19 with confusion and was given keppra IV and mental status returned to normal. They were concern about xanax withdrawal as well as use chantix.  "EEG--suggestive of cortical dysfunction in left frontotemporal region which could be secondary to underlying structural abnormality, post-ictal state.No seizures or epileptiform discharges were seen."  MRI of brain showed remote left basal ganglia lacunar infarct.   He had lumbar x-ray that showed "Superior endplate depression of L3 vertebral body is notedconcerning for fracture of indeterminate age". MRI of lumbar acute compression fracture of L3.    He reports he went 4-5 days without his xanax and started having the confusion. Since being discharged home he has been taking 0.5 mg twice a day.   Review of Systems  All other systems reviewed and are negative.      Objective:   Physical Exam Vitals reviewed.  Constitutional:      General: He is not in acute distress.    Appearance: He is well-developed.  HENT:     Head: Normocephalic.     Right Ear: Tympanic membrane normal.     Left Ear: Tympanic membrane normal.  Eyes:     General:        Right eye: No discharge.        Left eye: No discharge.     Pupils: Pupils are equal, round, and reactive to light.   Neck:     Thyroid: No thyromegaly.  Cardiovascular:     Rate and Rhythm: Normal rate and regular rhythm.     Heart sounds: Normal heart sounds. No murmur heard.   Pulmonary:     Effort: Pulmonary effort is normal. No respiratory distress.     Breath sounds: Normal breath sounds. No wheezing.  Abdominal:     General: Bowel sounds are normal. There is no distension.     Palpations: Abdomen is soft.     Tenderness: There is no abdominal tenderness.  Musculoskeletal:        General: No tenderness. Normal range of motion.     Cervical back: Normal range of motion and neck supple.  Skin:    General: Skin is warm and dry.     Coloration: Skin is pale.     Findings: No erythema or rash.  Neurological:     Mental Status: He is alert and oriented to person, place, and time.     Cranial Nerves: No cranial nerve deficit.     Deep Tendon Reflexes: Reflexes are normal and symmetric.  Psychiatric:        Behavior: Behavior normal.        Thought Content: Thought content normal.        Cognition and Memory: Memory is impaired.  Judgment: Judgment normal.      BP 119/75   Pulse 83   Temp 97.8 F (36.6 C) (Temporal)   Ht '5\' 9"'$  (1.753 m)   Wt 187 lb 3.2 oz (84.9 kg)   SpO2 94%   BMI 27.64 kg/m      Assessment & Plan:  Eric Cook comes in today with chief complaint of Transitions Of Care Forestine Na wants to ask about recall on chantix. tramodal not helping also wants to discuss the xanax Dr. Darnell Level was cutting back )   Diagnosis and orders addressed:  1. Hospital discharge follow-up - CBC with Differential/Platelet - BMP8+EGFR  2. Compression fracture of L3 vertebra with routine healing, subsequent encounter - CBC with Differential/Platelet - BMP8+EGFR  3. GAD (generalized anxiety disorder) - CBC with Differential/Platelet - BMP8+EGFR  4. Acute metabolic encephalopathy Discussed weaning xanax. After another week  Need to decrease once a day.  - CBC with  Differential/Platelet - BMP8+EGFR - Ambulatory referral to Neurology  5. Closed compression fracture of L3 lumbar vertebra, initial encounter ([place]) - CBC with Differential/Platelet - BMP8+EGFR   Labs pending Health Maintenance reviewed Diet and exercise encouraged  Follow up plan: Keep follow up appt with PCP   Evelina Dun, FNP

## 2019-09-08 NOTE — Patient Instructions (Signed)
Benzodiazepine Withdrawal Benzodiazepines are prescription medicines that decrease the activity of (depress) the central nervous system and cause changes in certain brain chemicals (neurotransmitters). There are many types of benzodiazepines. Some benzodiazepines take effect quickly and stay in your body for a short amount of time (short-acting). Other benzodiazepines require more time to take effect and stay in your body for longer amounts of time (long-acting). The five most commonly prescribed benzodiazepines are:  Alprazolam.  Lorazepam.  Clonazepam.  Diazepam.  Temazepam. Withdrawal is a group of physical and mental symptoms that can happen when you suddenly stop taking a medicine. What are the causes? When you take benzodiazepines, your brain needs more and more of the medicine over time to get the same effects from it. This increased need is called tolerance. As you develop a tolerance, your brain adapts to the effects of the benzodiazepine and relies on these effects. This is called dependency. Withdrawal happens when you suddenly stop taking your medicine. This does not give your brain enough time to adapt to not having the medicine. What increases the risk? You are more likely to develop this condition if you:  Have taken benzodiazepines for more than 1-2 weeks.  Have developed a tolerance for benzodiazepines.  Have developed a dependence on benzodiazepines.  Take high dosages of benzodiazepines.  Take doses of benzodiazepines that are higher than prescribed.  Take benzodiazepines without a prescription.  Use benzodiazepines with other substances that depress the central nervous system, such as alcohol.  Have a history of drug or alcohol abuse. What are the signs or symptoms? Symptoms of this condition may include:  Difficulty sleeping.  Anxiety.  Restlessness.  Irritability.  Muscle aches.  Involuntary shaking or trembling of a body part (tremor).  Confusion  and poor concentration.  Vomiting.  Sweating.  Headaches.  Feeling or seeing things that are not there (hallucinations).  Seizures. Symptoms of withdrawal from short-acting benzodiazepines may develop 1-2 days after you stop taking your medicine, and they may last for 2-4 weeks or longer. Symptoms of withdrawal from long-acting benzodiazepines may develop 2-7 days after you stop taking your medicine, and they may last for 2-8 weeks or longer. How is this diagnosed? This condition may be diagnosed based on:  Your symptoms.  A physical exam. Your health care provider may check for: ? Rapid heartbeat. ? Rapid breathing. ? Tremors. ? High blood pressure.  Blood tests.  Urine tests.  Your alcohol and drug habits.  Your medical history. How is this treated? Treatment usually involves starting you on a safe and stable dose of a benzodiazepine and then slowly lowering your dosage over time (tapered withdrawal). This may be done at a hospital or a treatment center. Treatment for this condition depends on:  Your symptoms.  The type of benzodiazepine you have been taking.  How long you have been taking benzodiazepines. Long-term treatment for this condition may involve medicine, counseling, and support groups. Follow these instructions at home:   Take over-the-counter and prescription medicines only as told by your health care provider.  Check with your health care provider before starting new medicines.  Keep all follow-up visits as told by your health care provider. This is important. How is this prevented?  Do not take any benzodiazepines without a prescription.  Do not take more than your prescribed dosage.  Do not mix benzodiazepines with alcohol or other medicines.  Do not stop taking benzodiazepines without speaking with your health care provider. Contact a health care provider if you:    Are not able to take your medicines as told by your health care  provider.  Have symptoms that get worse.  Develop withdrawal symptoms during your tapered withdrawal.  Develop a craving for drugs or alcohol.  Experience withdrawal again (relapse). Get help right away if you:  Have a seizure.  Become very confused.  Lose consciousness.  Have difficulty breathing.  Have serious thoughts about hurting yourself or someone else. If you ever feel like you may hurt yourself or others, or have thoughts about taking your own life, get help right away. You can go to your nearest emergency department or call:  Your local emergency services (911 in the [place]  A suicide crisis helpline, such as the National Suicide Prevention Lifeline at 1-800-273-8255. This is open 24 hours a day. Summary  Withdrawal is a group of physical and mental symptoms that can happen when you suddenly stop taking a benzodiazepine.  Treatment usually involves starting on a safe and stable dose of a benzodiazepine and then slowly lowering the dosage over time.  The type of treatment depends on your symptoms, how long you were taking the medicine, and which type of medicine you were taking.  Long-term treatment may include medicine, counseling, and support groups. This information is not intended to replace advice given to you by your health care provider. Make sure you discuss any questions you have with your health care provider. Document Revised: 10/22/2017 Document Reviewed: 09/25/2017 Elsevier Patient Education  2020 Elsevier Inc.  

## 2019-09-09 LAB — BMP8+EGFR
BUN/Creatinine Ratio: 14 (ref 10–24)
BUN: 15 mg/dL (ref 8–27)
CO2: 24 mmol/L (ref 20–29)
Calcium: 9.4 mg/dL (ref 8.6–10.2)
Chloride: 97 mmol/L (ref 96–106)
Creatinine, Ser: 1.09 mg/dL (ref 0.76–1.27)
GFR calc Af Amer: 77 mL/min/{1.73_m2} (ref >59)
GFR calc non Af Amer: 67 mL/min/{1.73_m2} (ref >59)
Glucose: 99 mg/dL (ref 65–99)
Potassium: 3.8 mmol/L (ref 3.5–5.2)
Sodium: 137 mmol/L (ref 134–144)

## 2019-09-09 LAB — CBC WITH DIFFERENTIAL/PLATELET
Basophils Absolute: 0 10*3/uL (ref 0.0–0.2)
Basos: 1 %
EOS (ABSOLUTE): 0.1 10*3/uL (ref 0.0–0.4)
Eos: 1 %
Hematocrit: 43.2 % (ref 37.5–51.0)
Hemoglobin: 14.7 g/dL (ref 13.0–17.7)
Immature Grans (Abs): 0 10*3/uL (ref 0.0–0.1)
Immature Granulocytes: 0 %
Lymphocytes Absolute: 1.6 10*3/uL (ref 0.7–3.1)
Lymphs: 23 %
MCH: 32.7 pg (ref 26.6–33.0)
MCHC: 34 g/dL (ref 31.5–35.7)
MCV: 96 fL (ref 79–97)
Monocytes Absolute: 0.6 10*3/uL (ref 0.1–0.9)
Monocytes: 9 %
Neutrophils Absolute: 4.8 10*3/uL (ref 1.4–7.0)
Neutrophils: 66 %
Platelets: 197 10*3/uL (ref 150–450)
RBC: 4.49 x10E6/uL (ref 4.14–5.80)
RDW: 13 % (ref 11.6–15.4)
WBC: 7.2 10*3/uL (ref 3.4–10.8)

## 2019-09-21 ENCOUNTER — Other Ambulatory Visit: Payer: Self-pay | Admitting: Family Medicine

## 2019-09-21 MED ORDER — VITAMIN D3 25 MCG PO TABS: 1000.0000 [IU] | ORAL_TABLET | Freq: Every day | ORAL | 1 refills | Status: DC

## 2019-09-21 MED ORDER — LEVETIRACETAM 250 MG PO TABS: 250.0000 mg | ORAL_TABLET | Freq: Two times a day (BID) | ORAL | 1 refills | Status: DC

## 2019-09-21 NOTE — Telephone Encounter (Signed)
  Prescription Request  09/21/2019  What is the name of the medication or equipment? Keppra 250 mg and Vitamin D3  Have you contacted your pharmacy to request a refill? (if applicable) yes    Which pharmacy would you like this sent to? [facility]   Patient notified that their request is being sent to the clinical staff for review and that they should receive a response within 2 business days.

## 2019-09-21 NOTE — Telephone Encounter (Signed)
Please follow up on Neuro referral as well.

## 2019-09-22 NOTE — Telephone Encounter (Signed)
Ref to Neurology is in-review with [facility].

## 2019-10-13 ENCOUNTER — Other Ambulatory Visit: Payer: Self-pay | Admitting: *Deleted

## 2019-10-14 ENCOUNTER — Ambulatory Visit (INDEPENDENT_AMBULATORY_CARE_PROVIDER_SITE_OTHER): Payer: Medicare HMO | Admitting: Family Medicine

## 2019-10-14 ENCOUNTER — Encounter: Payer: Self-pay | Admitting: Family Medicine

## 2019-10-14 ENCOUNTER — Other Ambulatory Visit: Payer: Self-pay

## 2019-10-14 VITALS — BP 128/71 | HR 63 | Temp 96.7°F | Ht 69.0 in | Wt 194.4 lb

## 2019-10-14 DIAGNOSIS — F13239 Sedative, hypnotic or anxiolytic dependence with withdrawal, unspecified: Secondary | ICD-10-CM | POA: Diagnosis not present

## 2019-10-14 DIAGNOSIS — F411 Generalized anxiety disorder: Secondary | ICD-10-CM | POA: Diagnosis not present

## 2019-10-14 DIAGNOSIS — Z23 Encounter for immunization: Secondary | ICD-10-CM

## 2019-10-14 DIAGNOSIS — S32030D Wedge compression fracture of third lumbar vertebra, subsequent encounter for fracture with routine healing: Secondary | ICD-10-CM | POA: Diagnosis not present

## 2019-10-14 DIAGNOSIS — R569 Unspecified convulsions: Secondary | ICD-10-CM

## 2019-10-14 DIAGNOSIS — F13288 Sedative, hypnotic or anxiolytic dependence with other sedative, hypnotic or anxiolytic-induced disorder: Secondary | ICD-10-CM

## 2019-10-14 MED ORDER — CIMETIDINE 400 MG PO TABS: 400.0000 mg | ORAL_TABLET | Freq: Two times a day (BID) | ORAL | 2 refills | Status: DC | PRN

## 2019-10-14 MED ORDER — ALPRAZOLAM 0.5 MG PO TABS: ORAL_TABLET | ORAL | 0 refills | Status: DC

## 2019-10-14 NOTE — Progress Notes (Signed)
Subjective: CC: GAD/ panic HPI: Eric Cook is a 74 y.o. male, who is accompanied by son, [name]. He is presenting to clinic today for:  1.  Generalized anxiety disorder Patient unfortunately was admitted to the hospital secondary to seizure activity.  Apparently had abruptly discontinued the benzodiazepine for a couple of weeks which led to the events.  He was started on Keppra in the hospital.  His EEG was without epileptiform changes.  He has not seen a neurologist in the outpatient setting.  His son, who would be his mode of transportation, asks for this to be made for [facility].  He has had no recurrent seizure-like activity but does report that he has had some abdominal irritation manifestations in the evening time since discontinuing the evening dose of Xanax.  He is only taking half a milligram each morning at this point.  He does report some increased anxiety symptoms.  He continues to take Lexapro 10 mg daily as directed.  He is very motivated to come off of as much medicine as possible.  2.  Hypertension with hyperlipidemia Patient is compliant with his blood pressure and cholesterol medications.  He does wonder if these are necessary however.  However he admits that blood pressures are typically in the upper ranges at home and perhaps he should stay on it.  No chest pain, shortness of breath, edema.  Past Medical History:  Diagnosis Date  . Anxiety   . Hyperlipidemia   . Hypertension    Past Surgical History:  Procedure Laterality Date  . BACK SURGERY     disc removal   Social History   Socioeconomic History  . Marital status: Single    Spouse name: Not on file  . Number of children: Not on file  . Years of education: Not on file  . Highest education level: Not on file  Occupational History  . Occupation: Lobbyist    Comment: part time  Tobacco Use  . Smoking status: Former Smoker    Packs/day: 0.50    Years: 30.00    Pack years: 15.00    Quit  date: 08/18/2019    Years since quitting: 0.1  . Smokeless tobacco: Never Used  Vaping Use  . Vaping Use: Never used  Substance and Sexual Activity  . Alcohol use: Never  . Drug use: Never  . Sexual activity: Never  Other Topics Concern  . Not on file  Social History Narrative   Works on    International aid/development worker of [employer] Strain:   . Difficulty of Paying Living Expenses: Not on file  Food Insecurity:   . Worried About Programme researcher, broadcasting/film/video in the Last Year: Not on file  . Ran Out of Food in the Last Year: Not on file  Transportation Needs:   . Lack of Transportation (Medical): Not on file  . Lack of Transportation (Non-Medical): Not on file  Physical Activity:   . Days of Exercise per Week: Not on file  . Minutes of Exercise per Session: Not on file  Stress:   . Feeling of Stress : Not on file  Social Connections:   . Frequency of Communication with Friends and Family: Not on file  . Frequency of Social Gatherings with Friends and Family: Not on file  . Attends Religious Services: Not on file  . Active Member of Clubs or Organizations: Not on file  . Attends Banker Meetings: Not on file  . Marital  Status: Not on file  Intimate Partner Violence:   . Fear of Current or Ex-Partner: Not on file  . Emotionally Abused: Not on file  . Physically Abused: Not on file  . Sexually Abused: Not on file   Current Meds  Medication Sig  . ALPRAZolam (XANAX) 1 MG tablet Take 1 tablet twice daily if needed for anxiety for 1 month.  Then go down to 1/2 tablet in the morning and 1 tablet at bedtime if needed x1 month.  Then go down to 1/2 tablet twice daily.  . cimetidine (TAGAMET) 400 MG tablet Take 400 mg by mouth 2 (two) times daily.   Marland Kitchen escitalopram (LEXAPRO) 10 MG tablet Take 1 tablet (10 mg total) by mouth daily.  Marland Kitchen levETIRAcetam (KEPPRA) 250 MG tablet Take 1 tablet (250 mg total) by mouth 2 (two) times daily.  Marland Kitchen lisinopril-hydrochlorothiazide  (ZESTORETIC) 10-12.5 MG tablet Take 1 tablet by mouth daily.  . simvastatin (ZOCOR) 20 MG tablet Take 1 tablet (20 mg total) by mouth daily at 6 PM.  . tamsulosin (FLOMAX) 0.4 MG CAPS capsule TAKE 1 CAPSULE EVERY DAY FOR URINARY SYMPTOMS  . Vitamin D3 (VITAMIN D) 25 MCG tablet Take 1 tablet (1,000 Units total) by mouth daily.   History reviewed. No pertinent family history. No Known Allergies   ROS: Per HPI  Objective: Office vital signs reviewed. BP 128/71   Pulse 63   Temp (!) 96.7 F (35.9 C)   Ht 5\' 9"  (1.753 m)   Wt 194 lb 6.4 oz (88.2 kg)   SpO2 97%   BMI 28.71 kg/m   Physical Examination:  General: Awake, alert, No acute distress Cardio: RRR, S1S2 heard Pulmonary: CTAB, normal work of breathing on room air Neuro: no focal deficits, no asterixis MSK: wearing back brace. Psych: mood stable. Speech clear. Good eye contact. Depression screen [facility] 2/9 10/14/2019 09/08/2019 07/31/2019  Decreased Interest 0 1 0  Down, Depressed, Hopeless 0 2 0  PHQ - 2 Score 0 3 0  Altered sleeping 0 2 1  Tired, decreased energy 0 2 1  Change in appetite 1 2 0  Feeling bad or failure about yourself  0 - 0  Trouble concentrating 0 0 0  Moving slowly or fidgety/restless 1 2 0  Suicidal thoughts 0 0 0  PHQ-9 Score 2 11 2   Difficult doing work/chores Not difficult at all - -   GAD 7 : Generalized Anxiety Score 10/14/2019 07/31/2019 06/23/2019 05/19/2019  Nervous, Anxious, on Edge 1 1 3 1   Control/stop worrying 0 0 1 3  Worry too much - different things 1 0 1 3  Trouble relaxing 1 0 3 3  Restless 1 1 3 3   Easily annoyed or irritable 1 0 1 3  Afraid - awful might happen 1 0 2 3  Total GAD 7 Score 6 2 14 19   Anxiety Difficulty Not difficult at all - Very difficult Not difficult at all    Assessment/ Plan: 74 y.o. male   1. GAD (generalized anxiety disorder) Unfortunately had complications of benzodiazepine withdrawal with subsequent seizure and altered mental status.  This clearly appears to  be due to abrupt discontinuation which I have counseled patient on extensively previously.  He sounds like he still having quite a bit of anxiety/withdrawal manifestation in the evening time given recent discontinuation of the evening 0.5 mg tablet.  Therefore I am adding back 0.25 mg at bedtime for the next 6 to 8 weeks in efforts to avoid withdrawal  symptoms.  He is aware of new prescription and plan.  His son was here today for conversation as well.  We will keep Lexapro 10 mg for now but discussed that we may consider advancing to 20 mg if needed going forward.  I would like to see them back in about 6 weeks, sooner if needed - Ambulatory referral to Neurology - ALPRAZolam (XANAX) 0.5 MG tablet; Take 1 tablet every morning and 1/2 tablet every evening  Dispense: 90 tablet; Refill: 0  2. Compression fracture of L3 vertebra with routine healing, subsequent encounter Apparently was told that there were no interventions to be had given history of back surgery.  He seems to be doing well from a pain standpoint and is only using Tylenol with good control.  Continue back brace as directed by specialist.  3. Seizure concurrent with and due to anxiolytic withdrawal (HCC) I have placed a new referral to neurology.  They would like to be seen in [place], not [place].  I suspect that the Keppra can be taken off at some point given lack of epileptiform changes on EEG in the hospital.  However, may benefit from having this on board whilst we continue to wean from Xanax.  He seems amenable to this plan.  They will contact me on Monday if no word from the neurologist for an appointment - Ambulatory referral to Neurology  4. Need for immunization against influenza Administered during today's visit without complication - Flu Vaccine QUAD High Dose(Fluad)  5. Need for vaccination against Streptococcus pneumoniae Administered during today's visit without complication. - Pneumococcal conjugate vaccine  13-valent   No orders of the defined types were placed in this encounter.   Raliegh Ip, DO [facility] [phone]

## 2019-11-02 ENCOUNTER — Encounter: Payer: Self-pay | Admitting: Family Medicine

## 2019-11-02 ENCOUNTER — Other Ambulatory Visit: Payer: Self-pay | Admitting: Family Medicine

## 2019-11-02 ENCOUNTER — Telehealth: Payer: Self-pay

## 2019-11-02 NOTE — Telephone Encounter (Signed)
Should not be due to benzo withdrawal at this point.  It may, however, be due to Keppra use.  I think diarrhea is a common side effect of that one.

## 2019-11-02 NOTE — Telephone Encounter (Signed)
Son aware and verbalizes understanding per dpr. 

## 2019-11-09 ENCOUNTER — Ambulatory Visit (INDEPENDENT_AMBULATORY_CARE_PROVIDER_SITE_OTHER): Payer: Medicare HMO | Admitting: Nurse Practitioner

## 2019-11-09 ENCOUNTER — Encounter: Payer: Self-pay | Admitting: Nurse Practitioner

## 2019-11-09 ENCOUNTER — Other Ambulatory Visit: Payer: Self-pay

## 2019-11-09 VITALS — BP 149/75 | HR 64 | Temp 97.0°F | Wt 192.0 lb

## 2019-11-09 DIAGNOSIS — R350 Frequency of micturition: Secondary | ICD-10-CM

## 2019-11-09 DIAGNOSIS — N401 Enlarged prostate with lower urinary tract symptoms: Secondary | ICD-10-CM

## 2019-11-09 DIAGNOSIS — N138 Other obstructive and reflux uropathy: Secondary | ICD-10-CM

## 2019-11-09 DIAGNOSIS — R197 Diarrhea, unspecified: Secondary | ICD-10-CM | POA: Diagnosis not present

## 2019-11-09 LAB — URINALYSIS, COMPLETE
Bilirubin, UA: NEGATIVE
Glucose, UA: NEGATIVE
Ketones, UA: NEGATIVE
Leukocytes,UA: NEGATIVE
Nitrite, UA: NEGATIVE
Protein,UA: NEGATIVE
RBC, UA: NEGATIVE
Specific Gravity, UA: <1.005 — ABNORMAL LOW (ref 1.005–1.030)
Urobilinogen, Ur: 0.2 mg/dL (ref 0.2–1.0)
pH, UA: 5 (ref 5.0–7.5)

## 2019-11-09 MED ORDER — DESITIN EX OINT: 1.0000 "application " | TOPICAL_OINTMENT | Freq: Three times a day (TID) | CUTANEOUS | 1 refills | Status: DC | PRN

## 2019-11-09 NOTE — Assessment & Plan Note (Signed)
Diarrhea is new for patient in the last 2 to 3 weeks.  Patient is reporting abdominal pain, and fullness.  Stool cultures obtained.  Labs pending.  Provided education to patient with printed handouts given. Follow-up with unresolved or worsening symptoms.

## 2019-11-09 NOTE — Assessment & Plan Note (Addendum)
Patient is reporting urinary frequency.  Symptoms started in the last 2 to 3 weeks.  Pain is reported 3/10 from a pain scale of 0-10.  Patient is reporting abdominal pain, fullness,  and increased flatulence.  He denies nausea, vomiting and fever.  Stat referral to urology, to rule out bladder fullness and decreased emptying  Provided education to patient with printed handouts given.

## 2019-11-09 NOTE — Progress Notes (Signed)
Established Patient Office Visit  Subjective:  Patient ID: Eric Cook, male    DOB: 08-Jul-1945  Age: 74 y.o. MRN: [id number]  CC:  Chief Complaint  Patient presents with  . Abdominal Pain  . Urinary Frequency  . Gas    HPI Eric Cook presents Abdominal Pain This is a new problem. The current episode started 1 to 4 weeks ago. The onset quality is gradual. The problem occurs constantly. The problem has been unchanged. The pain is located in the generalized abdominal region. The pain is at a severity of 5/10. The quality of the pain is colicky, aching and a sensation of fullness (Flatulence ). Associated symptoms include frequency and nausea. Pertinent negatives include no vomiting. Nothing aggravates the pain. The pain is relieved by passing flatus and belching. He has tried nothing for the symptoms.  Urinary Frequency  This is a new problem. The current episode started 1 to 4 weeks ago. The problem occurs every urination. The problem has been unchanged. The pain is at a severity of 5/10. There has been no fever. Associated symptoms include frequency, nausea and urgency. Pertinent negatives include no vomiting. He has tried nothing for the symptoms.    Past Medical History:  Diagnosis Date  . Anxiety   . Hyperlipidemia   . Hypertension     Past Surgical History:  Procedure Laterality Date  . BACK SURGERY     disc removal    No family history on file.  Social History   Socioeconomic History  . Marital status: Single    Spouse name: Not on file  . Number of children: Not on file  . Years of education: Not on file  . Highest education level: Not on file  Occupational History  . Occupation: Lobbyist    Comment: part time  Tobacco Use  . Smoking status: Former Smoker    Packs/day: 0.50    Years: 30.00    Pack years: 15.00    Quit date: 08/18/2019    Years since quitting: 0.2  . Smokeless tobacco: Never Used  Vaping Use  . Vaping Use: Never used   Substance and Sexual Activity  . Alcohol use: Never  . Drug use: Never  . Sexual activity: Never  Other Topics Concern  . Not on file  Social History Narrative   Works on    Social Determinants of Health                                                                         Outpatient Medications Prior to Visit  Medication Sig Dispense Refill  . ALPRAZolam (XANAX) 0.5 MG tablet Take 1 tablet every morning and 1/2 tablet every evening 90 tablet 0  . cholecalciferol (VITAMIN D) 25 MCG (1000 UNIT) tablet TAKE 1 TABLET EVERY DAY 90 tablet 0  . cimetidine (TAGAMET) 400 MG tablet Take 1 tablet (400 mg total) by mouth 2 (two) times daily as needed. 60 tablet 2  . escitalopram (LEXAPRO) 10 MG tablet Take 1 tablet (10 mg total) by mouth daily. 90 tablet 3  . levETIRAcetam (KEPPRA) 250 MG tablet TAKE 1 TABLET TWICE DAILY 120 tablet 0  . lisinopril-hydrochlorothiazide (ZESTORETIC) 10-12.5 MG tablet Take 1 tablet  by mouth daily. 90 tablet 3  . simvastatin (ZOCOR) 20 MG tablet Take 1 tablet (20 mg total) by mouth daily at 6 PM. 90 tablet 3  . tamsulosin (FLOMAX) 0.4 MG CAPS capsule TAKE 1 CAPSULE EVERY DAY FOR URINARY SYMPTOMS 90 capsule 3  . traMADol (ULTRAM) 50 MG tablet Take 1 tablet (50 mg total) by mouth every 6 (six) hours as needed for moderate pain. 14 tablet 0   No facility-administered medications prior to visit.    No Known Allergies  ROS Review of Systems  Gastrointestinal: Positive for abdominal pain and nausea. Negative for vomiting.  Genitourinary: Positive for frequency and urgency.       Lower abdominal pain.  Skin: Positive for color change.  All other systems reviewed and are negative.     Objective:    Physical Exam Vitals reviewed. Exam conducted with a chaperone present (patients son).  Constitutional:      Appearance: He is well-developed.  HENT:     Head: Normocephalic.  Cardiovascular:     Rate and Rhythm: Normal rate and  regular rhythm.     Heart sounds: Normal heart sounds.  Pulmonary:     Effort: Pulmonary effort is normal.     Breath sounds: Normal breath sounds.  Abdominal:     General: Bowel sounds are normal. There is distension.     Tenderness: There is generalized abdominal tenderness.  Skin:    General: Skin is warm.  Neurological:     Mental Status: He is alert and oriented to person, place, and time.  Psychiatric:        Mood and Affect: Mood normal.     BP (!) 149/75   Pulse 64   Temp (!) 97 F (36.1 C) (Temporal)   Wt 192 lb (87.1 kg)   SpO2 97%   BMI 28.35 kg/m  Wt Readings from Last 3 Encounters:  11/09/19 192 lb (87.1 kg)  10/14/19 194 lb 6.4 oz (88.2 kg)  09/08/19 187 lb 3.2 oz (84.9 kg)     Health Maintenance Due  Topic Date Due  . Hepatitis C Screening  Never done  . COLONOSCOPY  Never done    There are no preventive care reminders to display for this patient.  Lab Results  Component Value Date   TSH 0.939 08/24/2019   Lab Results  Component Value Date   WBC 7.2 09/08/2019   HGB 14.7 09/08/2019   HCT 43.2 09/08/2019   MCV 96 09/08/2019   PLT 197 09/08/2019   Lab Results  Component Value Date   NA 137 09/08/2019   K 3.8 09/08/2019   CO2 24 09/08/2019   GLUCOSE 99 09/08/2019   BUN 15 09/08/2019   CREATININE 1.09 09/08/2019   BILITOT 1.2 08/23/2019   ALKPHOS 89 08/23/2019   AST 24 08/23/2019   ALT 19 08/23/2019   PROT 7.9 08/23/2019   ALBUMIN 5.1 (H) 08/23/2019   CALCIUM 9.4 09/08/2019   ANIONGAP 13 08/25/2019     Assessment & Plan:   Problem List Items Addressed This Visit      Genitourinary   BPH (benign prostatic hyperplasia) - Primary   Relevant Orders   PSA   Bladder Scan [facility])   Ambulatory referral to Urology     Other   Urine frequency    Patient is reporting urinary frequency.  Symptoms started in the last 2 to 3 weeks.  Pain is reported 3/10 from a pain scale of 0-10.  Patient is reporting abdominal  pain, fullness,  and  increased flatulence.  He denies nausea, vomiting and fever.  Stat referral to urology, to rule out bladder fullness and decreased emptying  Provided education to patient with printed handouts given.      Relevant Orders   Urine Culture   Urinalysis, Complete (Completed)   Bladder Scan [facility])   Ambulatory referral to Urology   Diarrhea    Diarrhea is new for patient in the last 2 to 3 weeks.  Patient is reporting abdominal pain, and fullness.  Stool cultures obtained.  Labs pending.  Provided education to patient with printed handouts given. Follow-up with unresolved or worsening symptoms.      Relevant Medications   Diaper Rash Products (DESITIN) OINT   Other Relevant Orders   Cdiff NAA+O+P+Stool Culture   PSA      Meds ordered this encounter  Medications  . Diaper Rash Products (DESITIN) OINT    Sig: Apply 1 application topically 3 (three) times daily as needed.    Dispense:  99 g    Refill:  1    Order Specific Question:   Supervising Provider    Answer:   Arville Care A [[id number]]    Follow-up: Return if symptoms worsen or fail to improve.    Daryll Drown, NP

## 2019-11-09 NOTE — Progress Notes (Signed)
ambu

## 2019-11-10 ENCOUNTER — Encounter: Payer: Self-pay | Admitting: Nurse Practitioner

## 2019-11-10 ENCOUNTER — Ambulatory Visit: Payer: Medicare HMO | Admitting: Neurology

## 2019-11-10 ENCOUNTER — Encounter: Payer: Self-pay | Admitting: Neurology

## 2019-11-10 VITALS — BP 125/72 | HR 65 | Ht 69.0 in | Wt 191.8 lb

## 2019-11-10 DIAGNOSIS — G40209 Localization-related (focal) (partial) symptomatic epilepsy and epileptic syndromes with complex partial seizures, not intractable, without status epilepticus: Secondary | ICD-10-CM

## 2019-11-10 DIAGNOSIS — R4789 Other speech disturbances: Secondary | ICD-10-CM | POA: Diagnosis not present

## 2019-11-10 MED ORDER — LEVETIRACETAM 250 MG PO TABS: 250.0000 mg | ORAL_TABLET | Freq: Two times a day (BID) | ORAL | 1 refills | Status: DC

## 2019-11-10 NOTE — Patient Instructions (Signed)
I had a long discussion the patient and his son regarding his episodes of altered consciousness with speech disturbance likely complex partial seizures which may have been triggered by abrupt stopping Xanax.  Since he has an abnormal EEG I recommend strongly that he stay on Keppra 250 twice daily for seizure prophylaxis.  Recommend repeat EEG.  I advised him not to drive for 6 months as per [facility].  He was also advised to avoid seizure provoking triggers like abrupt medication discontinuation, medication noncompliance, alcohol, extremes of activity and exertion and abrupt changes in diet.  Will return for follow-up in the future in 3 months with my nurse practitioner Shanda Bumps or call earlier if necessary.  Epilepsy Epilepsy is when a person keeps having seizures. A seizure is a burst of abnormal activity in the brain. A seizure can change how you think or behave, and it can make it hard to be aware of what is happening. This condition can cause problems such as:  Falls, accidents, and injury.  Sadness (depression).  Poor memory.  Sudden unexplained death in epilepsy (SUDEP). This is rare. Its cause is not known. Most people with epilepsy lead normal lives. What are the causes? This condition may be caused by:  A head injury.  An injury that happens at birth.  A high fever during childhood.  A stroke.  Bleeding that goes into or around the brain.  Certain medicines and drugs.  Having too little oxygen for a long period of time.  Abnormal brain development.  Certain infections.  Brain tumors.  Conditions that are passed from parent to child (are hereditary). What are the signs or symptoms? Symptoms of a seizure vary from person to person. They may include:  Jerky movements of muscles (convulsions).  Stiffening of the body.  Movements of the arms or legs that you are not able to control.  Passing out (loss of consciousness).  Breathing problems.  Sudden  falls.  Confusion.  Head nodding.  Eye blinking or twitching.  Lip smacking.  Drooling.  Fast eye movements.  Grunting.  Not being able to control when you pee or poop.  Staring.  Being hard to wake up (unresponsiveness). Some people have symptoms right before a seizure happens (aura) and right after a seizure happens. These symptoms include:  Fear or anxiety.  Feeling sick to your stomach (nauseous).  Feeling like the room is spinning (vertigo).  A feeling of having seen or heard something before (dj vu).  Odd tastes or smells.  Changes in how you see (vision), such as seeing flashing lights or spots. Symptoms that follow a seizure include:  Being confused.  Being sleepy.  Having a headache. How is this treated? Treatment can control seizures. Treatment for this condition may involve:  Taking medicines to control seizures.  Having a device (vagus nerve stimulator) put in the chest. The device sends signals to a nerve and to the brain to prevent seizures.  Brain surgery to stop seizures from happening or to reduce how often they happen.  Having blood tests often to make sure you are getting the right amount of medicine. Once this condition has been diagnosed, it is important to start treatment as soon as possible. For some people, epilepsy goes away in time. Others will need treatment for the rest of their life. Follow these instructions at home: Medicines  Take over-the-counter and prescription medicines only as told by your doctor.  Avoid anything that may keep your medicine from working, such  as alcohol. Activity  Get enough rest. Lack of sleep can make seizures more likely to occur.  Follow your doctor's advice about driving, swimming, and doing anything else that would be dangerous if you had a seizure. ? If you live in the [place] check with your local DMV (department of motor vehicles) to find out about local driving laws. Each state has rules  about when you can return to driving. Teaching others Teach friends and family what to do if you have a seizure. They should:  Lay you on the ground to prevent a fall.  Cushion your head and body.  Loosen any tight clothing around your neck.  Turn you on your side.  Stay with you until you are better.  Not hold you down.  Not put anything in your mouth.  Know whether or not you need emergency care.  General instructions  Avoid anything that causes you to have seizures.  Keep a seizure diary. Write down what you remember about each seizure. Be sure to include what might have caused it.  Keep all follow-up visits as told by your doctor. This is important. Contact a doctor if:  You have a change in how often or when you have seizures.  You get an infection or start to feel sick. You may have more seizures when you are sick. Get help right away if:  A seizure does not stop after 5 minutes.  You have more than one seizure in a row, and you do not have enough time between the seizures to feel better.  A seizure makes it harder to breathe.  A seizure is different from other seizures you have had.  A seizure makes you unable to speak or use a part of your body.  You did not wake up right after a seizure. These symptoms may be an emergency. Do not wait to see if the symptoms will go away. Get medical help right away. Call your local emergency services (911 in the [place] Do not drive yourself to the hospital. Summary  Epilepsy is when a person keeps having seizures. A seizure is a burst of abnormal activity in the brain.  Treatment can control seizures.  Teach friends and family what to do if you have a seizure. This information is not intended to replace advice given to you by your health care provider. Make sure you discuss any questions you have with your health care provider. Document Revised: 09/02/2017 Document Reviewed: 09/02/2017 Elsevier Patient Education  2020  [employer].

## 2019-11-10 NOTE — Progress Notes (Signed)
[facility] [address] [place]. [place] [address] [phone]       OFFICE CONSULT NOTE  [name] Date of Birth:  February 03, 1945 Medical Record Number:  [id number]   Referring MD: Doylene Canard, DO  Reason for Referral: Seizure HPI: Eric Cook is a 74 year old Caucasian male with past medical history of hypertension, hyperlipidemia and anxiety who is seen today for initial office consultation visit for new onset seizures.  He is accompanied by his son [name] today.  History is obtained from them, review of electronic medical records and I personally reviewed available imaging films in PACS.  Patient had an episode on 08/24/2019 of sudden onset of speech difficulties.  He blames this on having stopped Xanax suddenly which she had been taking long-term 2 days prior.  He had also been unable to sleep the night before.  His son noticed that his speech did not make sense.  The patient called his son over the phone and kept on repeating saying I want to kill [name].  And the son came to his house and noticed him to be confused he was also haddock involuntary rubbing movement of his hands automatically.  He appeared disoriented and had word finding difficulties and trouble completing sentences.  These symptoms lasted about 24 hours and gradually improve the next day.  He was admitted to [facility] where MRI scan of the brain showed no acute abnormality but showed mild generalized atrophy and old left basal ganglia lacune.  EEG on 08/24/2019 showed focal left frontotemporal slowing in the 3 to 6 Hz range.  CBC, BMP and urine drug screen were negative except for benzos.  Patient was seen by Dr. Gerilyn Pilgrim neurologist who had strong suspicion for complex partial seizure and started up with patient on Keppra.  Patient has been taking Keppra to 50 mg twice daily which seems to be tolerating well and has had no further episodes like this since he left the hospital.  He is been  having some pain in his pelvis related to his prostate and has an upcoming appointment to see a urologist for that.  He denies any prior history of seizures, significant head injury with loss of consciousness, strokes or TIA.  There is no family history of epilepsy.  He denies drinking alcohol, using marijuana or drugs of abuse.  ROS:   14 system review of systems is positive for speech difficulty, confusion, disorientation and all other systems negative  PMH:  Past Medical History:  Diagnosis Date  . Anxiety   . Hyperlipidemia   . Hypertension   . Seizures (HCC)    medication related    Social History:  Social History   Socioeconomic History  . Marital status: Single    Spouse name: Not on file  . Number of children: Not on file  . Years of education: Not on file  . Highest education level: Not on file  Occupational History  . Occupation: Lobbyist    Comment: part time  Tobacco Use  . Smoking status: Former Smoker    Packs/day: 0.50    Years: 30.00    Pack years: 15.00    Quit date: 04/2019    Years since quitting: 0.5  . Smokeless tobacco: Never Used  Vaping Use  . Vaping Use: Never used  Substance and Sexual Activity  . Alcohol use: Never  . Drug use: Never  . Sexual activity: Never  Other Topics Concern  . Not on file  Social History Narrative   Lives alone   Right Handed   Drinks 2-3 cups of caffeine daily   Social Determinants of Health   Financial Resource Strain:   . Difficulty of Paying Living Expenses: Not on file  Food Insecurity:   . Worried About Programme researcher, broadcasting/film/video in the Last Year: Not on file  . Ran Out of Food in the Last Year: Not on file  Transportation Needs:   . Lack of Transportation (Medical): Not on file  . Lack of Transportation (Non-Medical): Not on file  Physical Activity:   . Days of Exercise per Week: Not on file  . Minutes of Exercise per Session: Not on file  Stress:   . Feeling of Stress : Not on file  Social  Connections:   . Frequency of Communication with Friends and Family: Not on file  . Frequency of Social Gatherings with Friends and Family: Not on file  . Attends Religious Services: Not on file  . Active Member of Clubs or Organizations: Not on file  . Attends Banker Meetings: Not on file  . Marital Status: Not on file  Intimate Partner Violence:   . Fear of Current or Ex-Partner: Not on file  . Emotionally Abused: Not on file  . Physically Abused: Not on file  . Sexually Abused: Not on file    Medications:   Current Outpatient Medications on File Prior to Visit  Medication Sig Dispense Refill  . ALPRAZolam (XANAX) 0.5 MG tablet Take 1 tablet every morning and 1/2 tablet every evening 90 tablet 0  . cholecalciferol (VITAMIN D) 25 MCG (1000 UNIT) tablet TAKE 1 TABLET EVERY DAY 90 tablet 0  . cimetidine (TAGAMET) 400 MG tablet Take 1 tablet (400 mg total) by mouth 2 (two) times daily as needed. 60 tablet 2  . Diaper Rash Products (DESITIN) OINT Apply 1 application topically 3 (three) times daily as needed. 99 g 1  . escitalopram (LEXAPRO) 10 MG tablet Take 1 tablet (10 mg total) by mouth daily. 90 tablet 3  . lisinopril-hydrochlorothiazide (ZESTORETIC) 10-12.5 MG tablet Take 1 tablet by mouth daily. 90 tablet 3  . simvastatin (ZOCOR) 20 MG tablet Take 1 tablet (20 mg total) by mouth daily at 6 PM. 90 tablet 3  . tamsulosin (FLOMAX) 0.4 MG CAPS capsule TAKE 1 CAPSULE EVERY DAY FOR URINARY SYMPTOMS 90 capsule 3  . traMADol (ULTRAM) 50 MG tablet Take 1 tablet (50 mg total) by mouth every 6 (six) hours as needed for moderate pain. 14 tablet 0   No current facility-administered medications on file prior to visit.    Allergies:  No Known Allergies  Physical Exam General: well developed, well nourished, seated, in no evident distress Head: head normocephalic and atraumatic.   Neck: supple with no carotid or supraclavicular bruits Cardiovascular: regular rate and rhythm, no  murmurs Musculoskeletal: no deformity Skin:  no rash/petichiae Vascular:  Normal pulses all extremities  Neurologic Exam Mental Status: Awake and fully alert. Oriented to place and time. Recent and remote memory intact. Attention span, concentration and fund of knowledge appropriate. Mood and affect appropriate.  Diminished recall 2/3.  Able to name 9 animals which can walk on 4 legs.   Cranial Nerves: Fundoscopic exam reveals sharp disc margins. Pupils equal, briskly reactive to light. Extraocular movements full without nystagmus. Visual fields full to confrontation. Hearing intact. Facial sensation intact. Face, tongue, palate moves normally and symmetrically.  Motor: Normal bulk and tone. Normal strength in all  tested extremity muscles. Sensory.: intact to touch , pinprick , position and vibratory sensation.  Coordination: Rapid alternating movements normal in all extremities. Finger-to-nose and heel-to-shin performed accurately bilaterally. Gait and Station: Arises from chair without difficulty. Stance is normal. Gait demonstrates normal stride length and balance . Able to heel, toe and tandem walk with slight difficulty.  Reflexes: 1+ and symmetric. Toes downgoing.   NIHSS  0 Modified Rankin  0   ASSESSMENT: 74 year old Caucasian male with recurrent transient episodes of speech disturbance, automatisms and confusion disorientation likely complex partial seizures.  Unremarkable brain imaging but abnormal EEG showing focal left frontotemporal slowing.     PLAN: I had a long discussion the patient and his son regarding his episodes of altered consciousness with speech disturbance likely complex partial seizures which may have been triggered by abrupt stopping Xanax.  Since he has an abnormal EEG I recommend strongly that he stay on Keppra 250 twice daily for seizure prophylaxis.  Recommend repeat EEG.  I advised him not to drive for 6 months as per [facility].  He was also advised  to avoid seizure provoking triggers like abrupt medication discontinuation, medication noncompliance, alcohol, extremes of activity and exertion and abrupt changes in diet.  Greater than 50% time during this 45-minute consultation visit was spent in counseling and coordination of care about his episodes of's complex partial seizures and discussion about prevention and treatment and answering questions he will return for follow-up in the future in 3 months with my nurse practitioner Shanda Bumps or call earlier if necessary. Delia Heady, MD Note: This document was prepared with digital dictation and possible smart phrase technology. Any transcriptional errors that result from this process are unintentional.

## 2019-11-11 LAB — URINE CULTURE: Organism ID, Bacteria: NO GROWTH

## 2019-11-30 DIAGNOSIS — N410 Acute prostatitis: Secondary | ICD-10-CM | POA: Diagnosis not present

## 2019-12-01 ENCOUNTER — Other Ambulatory Visit: Payer: Self-pay

## 2019-12-01 ENCOUNTER — Encounter: Payer: Self-pay | Admitting: Family Medicine

## 2019-12-01 ENCOUNTER — Ambulatory Visit (INDEPENDENT_AMBULATORY_CARE_PROVIDER_SITE_OTHER): Payer: Medicare HMO | Admitting: Family Medicine

## 2019-12-01 VITALS — BP 107/59 | HR 60 | Temp 97.7°F | Ht 69.0 in | Wt 194.0 lb

## 2019-12-01 DIAGNOSIS — G40219 Localization-related (focal) (partial) symptomatic epilepsy and epileptic syndromes with complex partial seizures, intractable, without status epilepticus: Secondary | ICD-10-CM | POA: Diagnosis not present

## 2019-12-01 DIAGNOSIS — F411 Generalized anxiety disorder: Secondary | ICD-10-CM | POA: Diagnosis not present

## 2019-12-01 DIAGNOSIS — N410 Acute prostatitis: Secondary | ICD-10-CM | POA: Diagnosis not present

## 2019-12-01 NOTE — Patient Instructions (Addendum)
Plan to reduce Xanax to 1/2 tablet twice daily starting January 06, 2020.   See me back in January for checkup .

## 2019-12-01 NOTE — Progress Notes (Signed)
Subjective: CC: Follow-up on benzodiazepine taper, seizure disorder HPI: Eric Cook is a 74 y.o. male, who is accompanied by his son son, [name]. He is presenting to clinic today for:  1.  Generalized anxiety disorder Compliant with Lexapro 10 mg daily.  He just started the 1 tablet each morning and 1/2 tablet each evening of the Xanax about a week and a half ago.  Thus far he has been doing well on this.  Denies any falls, excessive sedation.  No breakthrough anxiety or panic.  2.  Seizure disorder Seizure disorder thought to be epileptic in nature.  He is continued on Keppra 250 twice daily and will be having a repeat EEG soon.  He has had no seizure activity.  He does report some dissatisfaction with the fact that he cannot drive for a total of 6 months from the date of the seizure.  3.  Prostatitis Patient was diagnosed with prostatitis.  He was continued on Flomax and meloxicam was added.  He is expected to continue this regimen for about a month or month and a half before discontinuing.  He will follow-up with urology as needed  Past Medical History:  Diagnosis Date  . Anxiety   . Hyperlipidemia   . Hypertension   . Seizures (HCC)    medication related   Past Surgical History:  Procedure Laterality Date  . BACK SURGERY     disc removal   Social History   Socioeconomic History  . Marital status: Single    Spouse name: Not on file  . Number of children: Not on file  . Years of education: Not on file  . Highest education level: Not on file  Occupational History  . Occupation: Lobbyist    Comment: part time  Tobacco Use  . Smoking status: Former Smoker    Packs/day: 0.50    Years: 30.00    Pack years: 15.00    Quit date: 04/2019    Years since quitting: 0.6  . Smokeless tobacco: Never Used  Vaping Use  . Vaping Use: Never used  Substance and Sexual Activity  . Alcohol use: Never  . Drug use: Never  . Sexual activity: Never  Other Topics  Concern  . Not on file  Social History Narrative   Lives alone   Right Handed   Drinks 2-3 cups of caffeine daily   Social Determinants of Health   Financial Resource Strain:   . Difficulty of Paying Living Expenses: Not on file  Food Insecurity:   . Worried About Programme researcher, broadcasting/film/video in the Last Year: Not on file  . Ran Out of Food in the Last Year: Not on file  Transportation Needs:   . Lack of Transportation (Medical): Not on file  . Lack of Transportation (Non-Medical): Not on file  Physical Activity:   . Days of Exercise per Week: Not on file  . Minutes of Exercise per Session: Not on file  Stress:   . Feeling of Stress : Not on file  Social Connections:   . Frequency of Communication with Friends and Family: Not on file  . Frequency of Social Gatherings with Friends and Family: Not on file  . Attends Religious Services: Not on file  . Active Member of Clubs or Organizations: Not on file  . Attends Banker Meetings: Not on file  . Marital Status: Not on file  Intimate Partner Violence:   . Fear of Current or Ex-Partner: Not on  file  . Emotionally Abused: Not on file  . Physically Abused: Not on file  . Sexually Abused: Not on file   Current Meds  Medication Sig  . ALPRAZolam (XANAX) 0.5 MG tablet Take 1 tablet every morning and 1/2 tablet every evening  . cholecalciferol (VITAMIN D) 25 MCG (1000 UNIT) tablet TAKE 1 TABLET EVERY DAY  . cimetidine (TAGAMET) 400 MG tablet Take 1 tablet (400 mg total) by mouth 2 (two) times daily as needed.  . Diaper Rash Products (DESITIN) OINT Apply 1 application topically 3 (three) times daily as needed.  Marland Kitchen escitalopram (LEXAPRO) 10 MG tablet Take 1 tablet (10 mg total) by mouth daily.  Marland Kitchen levETIRAcetam (KEPPRA) 250 MG tablet Take 1 tablet (250 mg total) by mouth 2 (two) times daily.  Marland Kitchen lisinopril-hydrochlorothiazide (ZESTORETIC) 10-12.5 MG tablet Take 1 tablet by mouth daily.  . meloxicam (MOBIC) 15 MG tablet Take 15 mg by  mouth daily.  . simvastatin (ZOCOR) 20 MG tablet Take 1 tablet (20 mg total) by mouth daily at 6 PM.  . tamsulosin (FLOMAX) 0.4 MG CAPS capsule TAKE 1 CAPSULE EVERY DAY FOR URINARY SYMPTOMS  . traMADol (ULTRAM) 50 MG tablet Take 1 tablet (50 mg total) by mouth every 6 (six) hours as needed for moderate pain.   History reviewed. No pertinent family history. No Known Allergies   ROS: Per HPI  Objective: Office vital signs reviewed. BP (!) 107/59   Pulse 60   Temp 97.7 F (36.5 C)   Ht 5\' 9"  (1.753 m)   Wt 194 lb (88 kg)   SpO2 95%   BMI 28.65 kg/m   Physical Examination:  General: Awake, alert, No acute distress Cardio: Regular rate and rhythm.  S1-S2 heard.  No murmurs Pulmonary: Clear to auscultation bilaterally.  Normal work of breathing on room air.  No wheezes, rhonchi or rales Neuro: No focal neurologic deficits MSK: Ambulating independently without difficulty Psych: Mood stable, speech normal, patient is pleasant and interactive Depression screen [facility] 12/01/2019 10/14/2019 09/08/2019  Decreased Interest 0 0 1  Down, Depressed, Hopeless 0 0 2  PHQ - 2 Score 0 0 3  Altered sleeping - 0 2  Tired, decreased energy - 0 2  Change in appetite - 1 2  Feeling bad or failure about yourself  - 0 -  Trouble concentrating - 0 0  Moving slowly or fidgety/restless - 1 2  Suicidal thoughts - 0 0  PHQ-9 Score - 2 11  Difficult doing work/chores - Not difficult at all -   GAD 7 : Generalized Anxiety Score 10/14/2019 07/31/2019 06/23/2019 05/19/2019  Nervous, Anxious, on Edge 1 1 3 1   Control/stop worrying 0 0 1 3  Worry too much - different things 1 0 1 3  Trouble relaxing 1 0 3 3  Restless 1 1 3 3   Easily annoyed or irritable 1 0 1 3  Afraid - awful might happen 1 0 2 3  Total GAD 7 Score 6 2 14 19   Anxiety Difficulty Not difficult at all - Very difficult Not difficult at all    Assessment/ Plan: 74 y.o. male   1. GAD (generalized anxiety disorder) Mood is stable.  Patient is  quite jovial during today's visit.  Continue Lexapro at 10 mg daily.  He just started the 1 tablet each morning and 1/2 tablet each evening of the Xanax I would like him to really be on this regimen for at least a good 5 to 6 weeks before  proceeding with any additional taper.  He has enough to continue current taper and we will plan to reduce to 1/2 tablet twice daily the second week in December.  These instructions were reviewed with him and his son.  Will be glad to renew his Xanax prescription if needed at that point.  2. Partial symptomatic epilepsy with complex partial seizures, intractable, without status epilepticus (HCC) Contrary to previous suspicion, symptoms are thought to be true epilepsy.  He has a repeat EEG scheduled with neurology soon.  3. Acute prostatitis Currently being treated with meloxicam and Flomax.  No changes to current regimen.  Patient is scheduled Covid booster shot.  I will see him back in January, sooner if needed  Raliegh Ip, DO [facility] [phone]

## 2019-12-18 ENCOUNTER — Other Ambulatory Visit: Payer: Self-pay | Admitting: Family Medicine

## 2020-02-02 ENCOUNTER — Other Ambulatory Visit: Payer: Self-pay

## 2020-02-02 ENCOUNTER — Ambulatory Visit (INDEPENDENT_AMBULATORY_CARE_PROVIDER_SITE_OTHER): Payer: Medicare HMO | Admitting: Family Medicine

## 2020-02-02 VITALS — BP 139/66 | HR 60 | Temp 97.9°F | Ht 69.0 in | Wt 190.0 lb

## 2020-02-02 DIAGNOSIS — B9689 Other specified bacterial agents as the cause of diseases classified elsewhere: Secondary | ICD-10-CM

## 2020-02-02 DIAGNOSIS — F411 Generalized anxiety disorder: Secondary | ICD-10-CM

## 2020-02-02 DIAGNOSIS — G40219 Localization-related (focal) (partial) symptomatic epilepsy and epileptic syndromes with complex partial seizures, intractable, without status epilepticus: Secondary | ICD-10-CM | POA: Diagnosis not present

## 2020-02-02 DIAGNOSIS — J019 Acute sinusitis, unspecified: Secondary | ICD-10-CM

## 2020-02-02 NOTE — Progress Notes (Signed)
Subjective: CC: Follow-up on benzodiazepine taper, seizure disorder HPI: Eric Cook is a 75 y.o. male, who is accompanied by his son son, Eric Cook. He is presenting to clinic today for:  1.  Generalized anxiety disorder Compliant with Lexapro 10 mg daily. Was expected to taper to 1/2 BID in December.  He overall seems to be doing pretty well on this regimen.  He successfully got down to 1/2 tablet of Xanax each night at bedtime.  He seems to be doing very well on this.  No breakthrough seizure activity.  Anxiety is fairly controlled.  However, he would most certainly be amenable to an increased dose of Lexapro  2. Seizure disorder Next OV with neuro 02/22/20.  Again no seizure activity since our last visit.  He is compliant with medications.  3. sinusitis Patient reports about a 2-week history of rhinorrhea.  He had some nasal bleeding at 1 point.  Denies any maxillary, frontal sinus pain or dental pain.  He does report a foul odor in the right nare with associated drainage.  No fevers, chills, shortness of breath or wheezing.   Past Medical History:  Diagnosis Date  . Anxiety   . Hyperlipidemia   . Hypertension   . Seizures (HCC)    medication related   Past Surgical History:  Procedure Laterality Date  . BACK SURGERY     disc removal   Social History   Socioeconomic History  . Marital status: Single    Spouse name: Not on file  . Number of children: Not on file  . Years of education: Not on file  . Highest education level: Not on file  Occupational History  . Occupation: Lobbyist    Comment: part time  Tobacco Use  . Smoking status: Former Smoker    Packs/day: 0.50    Years: 30.00    Pack years: 15.00    Quit date: 04/2019    Years since quitting: 0.7  . Smokeless tobacco: Never Used  Vaping Use  . Vaping Use: Never used  Substance and Sexual Activity  . Alcohol use: Never  . Drug use: Never  . Sexual activity: Never  Other Topics Concern  .  Not on file  Social History Narrative   Lives alone   Right Handed   Drinks 2-3 cups of caffeine daily   Social Determinants of Health   Financial Resource Strain: Not on file  Food Insecurity: Not on file  Transportation Needs: Not on file  Physical Activity: Not on file  Stress: Not on file  Social Connections: Not on file  Intimate Partner Violence: Not on file   No outpatient medications have been marked as taking for the 02/02/20 encounter (Appointment) with Raliegh Ip, DO.   No family history on file. No Known Allergies   ROS: Per HPI  Objective: Office vital signs reviewed. BP 139/66   Pulse 60   Temp 97.9 F (36.6 C) (Temporal)   Ht 5\' 9"  (1.753 m)   Wt 190 lb (86.2 kg)   SpO2 98%   BMI 28.06 kg/m   Physical Examination:  General: Awake, alert, No acute distress HEENT: He has erythema and edema of the nasal turbinates bilaterally.  No appreciable purulent discharge.  TMs are intact bilaterally.  No tenderness to palpation to the frontal sinus or maxillary sinuses.  He does have quite a bit of erythema and tenderness along the medial nasal septum concerning for subsequent soft tissue infection/nasal vestibulitis. Cardio: Regular rate  and rhythm.  S1-S2 heard.  No murmurs Pulmonary: Clear to auscultation bilaterally.  Normal work of breathing on room air.  No wheezes, rhonchi or rales Neuro: No tremor.  No asterixis.  Does not appear to be responding to internal stimuli Depression screen [facility] 2/9 12/01/2019 10/14/2019 09/08/2019  Decreased Interest 0 0 1  Down, Depressed, Hopeless 0 0 2  PHQ - 2 Score 0 0 3  Altered sleeping - 0 2  Tired, decreased energy - 0 2  Change in appetite - 1 2  Feeling bad or failure about yourself  - 0 -  Trouble concentrating - 0 0  Moving slowly or fidgety/restless - 1 2  Suicidal thoughts - 0 0  PHQ-9 Score - 2 11  Difficult doing work/chores - Not difficult at all -   GAD 7 : Generalized Anxiety Score 02/02/2020 10/14/2019  07/31/2019 06/23/2019  Nervous, Anxious, on Edge 1 1 1 3   Control/stop worrying 2 0 0 1  Worry too much - different things 2 1 0 1  Trouble relaxing 1 1 0 3  Restless 0 1 1 3   Easily annoyed or irritable 0 1 0 1  Afraid - awful might happen 0 1 0 2  Total GAD 7 Score 6 6 2 14   Anxiety Difficulty Somewhat difficult Not difficult at all - Very difficult    Assessment/ Plan: 75 y.o. male   GAD (generalized anxiety disorder) - Plan: ALPRAZolam (XANAX) 0.25 MG tablet  Partial symptomatic epilepsy with complex partial seizures, intractable, without status epilepticus (HCC)  Acute bacterial sinusitis - Plan: doxycycline (VIBRA-TABS) 100 MG tablet  Anxiety is not entirely controlled and therefore I have advanced his Lexapro to 15 mg daily.  I have also continued on his taper and adjusted his pill as it can be more easily taken to the 0.25 mg of alprazolam.  He will continue 0.25 mg at bedtime for at least the next month and then will proceed down to 0.125 mg at bedtime for about 5 to 6 weeks and then every other day for about 2 weeks and then he can come off of the medicine.  Keep neuro appointment for follow-up on epilepsy  Acute bacterial sinusitis was treated with doxycycline today.  A written prescription was given as are computer system had gone down.  We discussed home care instructions and reasons to return.  He voiced good understanding will follow up as needed or in 2 to 3 months  Sierah Lacewell M Arta Stump, DO [facility] [phone]

## 2020-02-04 MED ORDER — DOXYCYCLINE HYCLATE 100 MG PO TABS: 100.0000 mg | ORAL_TABLET | Freq: Two times a day (BID) | ORAL | 0 refills | Status: AC

## 2020-02-04 MED ORDER — ESCITALOPRAM OXALATE 10 MG PO TABS: 15.0000 mg | ORAL_TABLET | Freq: Every day | ORAL | 3 refills | Status: DC

## 2020-02-04 MED ORDER — ALPRAZOLAM 0.25 MG PO TABS: ORAL_TABLET | ORAL | 0 refills | Status: DC

## 2020-02-22 ENCOUNTER — Other Ambulatory Visit: Payer: Self-pay

## 2020-02-22 ENCOUNTER — Ambulatory Visit: Payer: Medicare HMO | Admitting: Adult Health

## 2020-02-22 ENCOUNTER — Encounter: Payer: Self-pay | Admitting: Adult Health

## 2020-02-22 VITALS — BP 132/67 | HR 64 | Ht 69.0 in | Wt 194.0 lb

## 2020-02-22 DIAGNOSIS — G40209 Localization-related (focal) (partial) symptomatic epilepsy and epileptic syndromes with complex partial seizures, not intractable, without status epilepticus: Secondary | ICD-10-CM

## 2020-02-22 MED ORDER — LEVETIRACETAM 250 MG PO TABS: 250.0000 mg | ORAL_TABLET | Freq: Two times a day (BID) | ORAL | 3 refills | Status: DC

## 2020-02-22 NOTE — Patient Instructions (Addendum)
Your Plan:  Continue Keppra 250 mg twice daily for seizure prevention  Please ensure you schedule repeat EEG -this can be done today at check out    Follow-up in 6 months or call earlier if needed     Thank you for coming to see [place] at [facility]. I hope we have been able to provide you high quality care today.  You may receive a patient satisfaction survey over the next few weeks. We would appreciate your feedback and comments so that we may continue to improve ourselves and the health of our patients.

## 2020-02-22 NOTE — Progress Notes (Signed)
[facility] [address] [place]. [place] [address] [phone]       OFFICE FOLLOW UP NOTE  [name] Date of Birth:  03-18-1945 Medical Record Number:  [id number]   Referring MD: Doylene Canard, DO  Reason for Referral: Seizure   Chief Complaint  Patient presents with  . Follow-up    Tx rm, with son, states he is doing well  . Seizures     HPI:   Today, 02/22/2020, [name] returns for seizure follow-up accompanied by his son.  He has remained on Keppra 250 mg twice daily tolerating well without side effects and has not had any additional seizure type activity.  Recommended repeat EEG not yet completed -patient and son thought today's visit was to have this completed.  He questions return back to driving as it has been 6 months since any seizure activity.  No concerns at this time.   History provided for reference purposes only Initial consult visit 11/10/2019 Dr. Pearlean Brownie: [name] is a 75 year old Caucasian male with past medical history of hypertension, hyperlipidemia and anxiety who is seen today for initial office consultation visit for new onset seizures.  He is accompanied by his son [name] today.  History is obtained from them, review of electronic medical records and I personally reviewed available imaging films in PACS.  Patient had an episode on 08/24/2019 of sudden onset of speech difficulties.  He blames this on having stopped Xanax suddenly which she had been taking long-term 2 days prior.  He had also been unable to sleep the night before.  His son noticed that his speech did not make sense.  The patient called his son over the phone and kept on repeating saying I want to kill [name].  And the son came to his house and noticed him to be confused he was also haddock involuntary rubbing movement of his hands automatically.  He appeared disoriented and had word finding difficulties and trouble completing sentences.  These symptoms lasted about 24  hours and gradually improve the next day.  He was admitted to [facility] where MRI scan of the brain showed no acute abnormality but showed mild generalized atrophy and old left basal ganglia lacune.  EEG on 08/24/2019 showed focal left frontotemporal slowing in the 3 to 6 Hz range.  CBC, BMP and urine drug screen were negative except for benzos.  Patient was seen by Dr. Gerilyn Pilgrim neurologist who had strong suspicion for complex partial seizure and started up with patient on Keppra.  Patient has been taking Keppra 250 mg twice daily which seems to be tolerating well and has had no further episodes like this since he left the hospital.  He is been having some pain in his pelvis related to his prostate and has an upcoming appointment to see a urologist for that.  He denies any prior history of seizures, significant head injury with loss of consciousness, strokes or TIA.  There is no family history of epilepsy.  He denies drinking alcohol, using marijuana or drugs of abuse.  ROS:   14 system review of systems is positive for those listed in HPI and all other systems negative  PMH:  Past Medical History:  Diagnosis Date  . Anxiety   . Hyperlipidemia   . Hypertension   . Seizures (HCC)    medication related    Social History:  Social History   Socioeconomic History  . Marital status: Single    Spouse name: Not on  file  . Number of children: Not on file  . Years of education: Not on file  . Highest education level: Not on file  Occupational History  . Occupation: Lobbyist    Comment: part time  Tobacco Use  . Smoking status: Former Smoker    Packs/day: 0.50    Years: 30.00    Pack years: 15.00    Quit date: 04/2019    Years since quitting: 0.8  . Smokeless tobacco: Never Used  Vaping Use  . Vaping Use: Never used  Substance and Sexual Activity  . Alcohol use: Never  . Drug use: Never  . Sexual activity: Never  Other Topics Concern  . Not on file  Social  History Narrative   Lives alone   Right Handed   Drinks 2-3 cups of caffeine daily   Social Determinants of Health   Financial Resource Strain: Not on file  Food Insecurity: Not on file  Transportation Needs: Not on file  Physical Activity: Not on file  Stress: Not on file  Social Connections: Not on file  Intimate Partner Violence: Not on file    Medications:   Current Outpatient Medications on File Prior to Visit  Medication Sig Dispense Refill  . ALPRAZolam (XANAX) 0.25 MG tablet Take 1 tablet (0.25 mg total) by mouth at bedtime as needed for 30 days for anxiety, THEN 0.5 tablets (0.125 mg total) at bedtime as needed for 45 days for anxiety, THEN 0.5 tablets (0.125 mg total) every other day as needed for up to 15 days for anxiety. Then STOP. 56 tablet 0  . cholecalciferol (VITAMIN D) 25 MCG (1000 UNIT) tablet TAKE 1 TABLET EVERY DAY 90 tablet 0  . cimetidine (TAGAMET) 400 MG tablet TAKE 1 TABLET TWICE DAILY AS NEEDED 180 tablet 0  . Diaper Rash Products (DESITIN) OINT Apply 1 application topically 3 (three) times daily as needed. 99 g 1  . escitalopram (LEXAPRO) 10 MG tablet Take 1.5 tablets (15 mg total) by mouth daily. 135 tablet 3  . lisinopril-hydrochlorothiazide (ZESTORETIC) 10-12.5 MG tablet Take 1 tablet by mouth daily. 90 tablet 3  . simvastatin (ZOCOR) 20 MG tablet Take 1 tablet (20 mg total) by mouth daily at 6 PM. 90 tablet 3  . tamsulosin (FLOMAX) 0.4 MG CAPS capsule TAKE 1 CAPSULE EVERY DAY FOR URINARY SYMPTOMS 90 capsule 3   No current facility-administered medications on file prior to visit.    Allergies:  No Known Allergies  Physical Exam Today's Vitals   02/22/20 0734  BP: 132/67  Pulse: 64  Weight: 194 lb (88 kg)  Height: 5\' 9"  (1.753 m)   Body mass index is 28.65 kg/m.    General: well developed, well nourished, pleasant elderly Caucasian male, seated, in no evident distress Head: head normocephalic and atraumatic.   Neck: supple with no carotid or  supraclavicular bruits Cardiovascular: regular rate and rhythm, no murmurs Musculoskeletal: no deformity Skin:  no rash/petichiae Vascular:  Normal pulses all extremities  Neurologic Exam Mental Status: Awake and fully alert.  Fluent speech and language.  Oriented to place and time. Recent and remote memory intact. Attention span, concentration and fund of knowledge appropriate. Mood and affect appropriate.    Cranial Nerves: Pupils equal, briskly reactive to light. Extraocular movements full without nystagmus. Visual fields full to confrontation. Hearing intact. Facial sensation intact. Face, tongue, palate moves normally and symmetrically.  Motor: Normal bulk and tone. Normal strength in all tested extremity muscles. Sensory.: intact to touch ,  pinprick , position and vibratory sensation.  Coordination: Rapid alternating movements normal in all extremities. Finger-to-nose and heel-to-shin performed accurately bilaterally. Gait and Station: Arises from chair without difficulty. Stance is normal. Gait demonstrates normal stride length and balance . Able to heel, toe and tandem walk with slight difficulty.  Reflexes: 1+ and symmetric. Toes downgoing.        ASSESSMENT/PLAN: 75 year old Caucasian male with recurrent transient episodes of speech disturbance, automatisms and confusion disorientation likely complex partial seizures on 08/23/2019.  Unremarkable brain imaging but abnormal EEG showing focal left frontotemporal slowing.    1. Partial symptomatic epilepsy with complex partial seizures, not intractable, without status epilepticus (HCC) -Continue Keppra 250 mg twice daily for seizure prophylaxis -refill provided -Repeat EEG needs to be completed - order replaced and advised to schedule today at checkout -Tomorrow will be 6 months since seizure activity - per [place] law, he can return back to driving after tomorrow as it has been 6 months and currently stable without seizure  activity -advised to avoid seizure provoking triggers like abrupt medication discontinuation, medication noncompliance, alcohol, extremes of activity and exertion and abrupt changes in diet    Follow-up in 6 months or call earlier if needed  CC:  [facility] provider: Dr. Barbie Haggis, Rozell Searing, DO   I spent 25 minutes of face-to-face and non-face-to-face time with patient and son.  This included previsit chart review, lab review, study review, order entry, electronic health record documentation, patient and son education and discussion regarding history of seizures, ongoing use of AED, indication for repeat EEG and answered all other questions to patient and son satisfaction  Ihor Austin, AGNP-BC  [facility] [address] [place], [place] [address]  Phone [phone] Fax [phone] Note: This document was prepared with digital dictation and possible smart phrase technology. Any transcriptional errors that result from this process are unintentional.

## 2020-02-22 NOTE — Progress Notes (Signed)
I agree with the above plan 

## 2020-02-25 ENCOUNTER — Other Ambulatory Visit: Payer: Self-pay | Admitting: Family Medicine

## 2020-02-25 DIAGNOSIS — I10 Essential (primary) hypertension: Secondary | ICD-10-CM

## 2020-02-29 ENCOUNTER — Encounter: Payer: Self-pay | Admitting: Neurology

## 2020-02-29 ENCOUNTER — Other Ambulatory Visit: Payer: Medicare HMO

## 2020-03-07 ENCOUNTER — Ambulatory Visit: Payer: Medicare HMO | Admitting: Neurology

## 2020-03-07 DIAGNOSIS — G40219 Localization-related (focal) (partial) symptomatic epilepsy and epileptic syndromes with complex partial seizures, intractable, without status epilepticus: Secondary | ICD-10-CM

## 2020-03-07 NOTE — Procedures (Signed)
    History:  Eric Cook is a 75 year old gentleman with a history of seizure type episodes.  On 24 August 2019 he had an episode of sudden onset speech difficulties.  He had stopped Xanax 2 days prior.  Prior EEG study has shown evidence of left frontotemporal slowing.  The patient has been treated with Keppra for seizures.  This is a routine EEG.  No skull defects are noted.  Medications include vitamin D, cimetidine, Lexapro, Zestoretic, Zocor, and Flomax.  EEG classification: Normal awake  Description of the recording: The background rhythms of this recording consists of a fairly well modulated medium amplitude alpha rhythm of 8 Hz that is reactive to eye opening and closure. As the record progresses, the patient appears to remain in the waking state throughout the recording. Photic stimulation was performed, resulting in a bilateral and symmetric photic driving response. Hyperventilation was also performed, resulting in a minimal buildup of the background rhythm activities without significant slowing seen. At no time during the recording does there appear to be evidence of spike or spike wave discharges or evidence of focal slowing. EKG monitor shows no evidence of cardiac rhythm abnormalities with a heart rate of 66.  Impression: This is a normal EEG recording in the waking state. No evidence of ictal or interictal discharges are seen.

## 2020-03-10 ENCOUNTER — Telehealth: Payer: Self-pay | Admitting: *Deleted

## 2020-03-10 NOTE — Telephone Encounter (Signed)
-----   Message from Ihor Austin, NP sent at 03/10/2020  8:24 AM EST ----- Please advise patient that recent EEG did not show any evidence of seizures. Please continue current Keppra regimen for seizure prophylaxis

## 2020-03-10 NOTE — Telephone Encounter (Signed)
Relayed to pt that EEG normal. No seizures.  Continue regimen.

## 2020-04-04 ENCOUNTER — Other Ambulatory Visit: Payer: Self-pay

## 2020-04-04 ENCOUNTER — Encounter: Payer: Self-pay | Admitting: Family Medicine

## 2020-04-04 ENCOUNTER — Ambulatory Visit (INDEPENDENT_AMBULATORY_CARE_PROVIDER_SITE_OTHER): Payer: Medicare HMO | Admitting: Family Medicine

## 2020-04-04 VITALS — BP 126/68 | HR 64 | Temp 96.9°F | Ht 69.0 in | Wt 194.0 lb

## 2020-04-04 DIAGNOSIS — E782 Mixed hyperlipidemia: Secondary | ICD-10-CM

## 2020-04-04 DIAGNOSIS — K219 Gastro-esophageal reflux disease without esophagitis: Secondary | ICD-10-CM

## 2020-04-04 DIAGNOSIS — I10 Essential (primary) hypertension: Secondary | ICD-10-CM

## 2020-04-04 DIAGNOSIS — F411 Generalized anxiety disorder: Secondary | ICD-10-CM

## 2020-04-04 DIAGNOSIS — K580 Irritable bowel syndrome with diarrhea: Secondary | ICD-10-CM

## 2020-04-04 MED ORDER — SIMVASTATIN 20 MG PO TABS: 20.0000 mg | ORAL_TABLET | Freq: Every day | ORAL | 3 refills | Status: DC

## 2020-04-04 MED ORDER — ESOMEPRAZOLE MAGNESIUM 40 MG PO CPDR: 40.0000 mg | DELAYED_RELEASE_CAPSULE | Freq: Every day | ORAL | 3 refills | Status: DC

## 2020-04-04 MED ORDER — LISINOPRIL-HYDROCHLOROTHIAZIDE 10-12.5 MG PO TABS: 1.0000 | ORAL_TABLET | Freq: Every day | ORAL | 3 refills | Status: DC

## 2020-04-04 MED ORDER — DEXLANSOPRAZOLE 60 MG PO CPDR: 60.0000 mg | DELAYED_RELEASE_CAPSULE | Freq: Every day | ORAL | 0 refills | Status: DC

## 2020-04-04 NOTE — Progress Notes (Signed)
Subjective: CC: f/u GAD PCP: Raliegh Ip, DO XNA:TFTDDU R Eric Cook is a 75 y.o. male, who is accompanied by his son today's visit.  Presenting to clinic today for:  1. GAD Patient tapered from Xanax.  His Lexapro increased to 15mg  last visit.  Overall he feels okay from an anxiety standpoint.  He does note that he struggles a lot with his gut and this has been a chronic problem for many years.  See below  2.  IBS-D, GERD Patient reports that his taste is off and greasy smells make him sick on his stomach.  He gets intermittent nausea and has subsequently not been eating as well over the last couple of days.  He reports chronic diarrhea.  Has not reported any blood in stool.  Has generalized abdominal cramping intermittently.  This has been ongoing for years.  Has had an extensive work-up in the past but nothing could be really pinpointed.  Has been treated with Tagamet but again the symptoms are through the Tagamet.  Not currently on any probiotics.  Denies any constipation.  3.  Sinusitis Patient with ongoing sinus fullness on the right.  He has intermittent right nare bleeds.  4.  Hypertension with hyperlipidemia Patient is compliant with lisinopril-hydrochlorothiazide and Zocor.  He reports urine output is slow but otherwise normal.  No urinary incontinence   ROS: Per HPI  No Known Allergies Past Medical History:  Diagnosis Date  . Anxiety   . Hyperlipidemia   . Hypertension   . Seizures (HCC)    medication related    Current Outpatient Medications:  .  ALPRAZolam (XANAX) 0.25 MG tablet, Take 1 tablet (0.25 mg total) by mouth at bedtime as needed for 30 days for anxiety, THEN 0.5 tablets (0.125 mg total) at bedtime as needed for 45 days for anxiety, THEN 0.5 tablets (0.125 mg total) every other day as needed for up to 15 days for anxiety. Then STOP., Disp: 56 tablet, Rfl: 0 .  cholecalciferol (VITAMIN D) 25 MCG (1000 UNIT) tablet, TAKE 1 TABLET EVERY DAY, Disp: 90 tablet,  Rfl: 0 .  cimetidine (TAGAMET) 400 MG tablet, TAKE 1 TABLET TWICE DAILY AS NEEDED, Disp: 180 tablet, Rfl: 0 .  Diaper Rash Products (DESITIN) OINT, Apply 1 application topically 3 (three) times daily as needed., Disp: 99 g, Rfl: 1 .  escitalopram (LEXAPRO) 10 MG tablet, Take 1.5 tablets (15 mg total) by mouth daily., Disp: 135 tablet, Rfl: 3 .  levETIRAcetam (KEPPRA) 250 MG tablet, Take 1 tablet (250 mg total) by mouth 2 (two) times daily., Disp: 180 tablet, Rfl: 3 .  lisinopril-hydrochlorothiazide (ZESTORETIC) 10-12.5 MG tablet, TAKE 1 TABLET EVERY DAY, Disp: 90 tablet, Rfl: 0 .  simvastatin (ZOCOR) 20 MG tablet, Take 1 tablet (20 mg total) by mouth daily at 6 PM., Disp: 90 tablet, Rfl: 3 .  tamsulosin (FLOMAX) 0.4 MG CAPS capsule, TAKE 1 CAPSULE EVERY DAY FOR URINARY SYMPTOMS, Disp: 90 capsule, Rfl: 3 Social History   Socioeconomic History  . Marital status: Single    Spouse name: Not on file  . Number of children: Not on file  . Years of education: Not on file  . Highest education level: Not on file  Occupational History  . Occupation:    Comment: part time  Tobacco Use  . Smoking status: Former Smoker    Packs/day: 0.50    Years: 30.00    Pack years: 15.00    Quit date: 04/2019  Years since quitting: 0.9  . Smokeless tobacco: Never Used  Vaping Use  . Vaping Use: Never used  Substance and Sexual Activity  . Alcohol use: Never  . Drug use: Never  . Sexual activity: Never  Other Topics Concern  . Not on file  Social History Narrative   Lives alone   Right Handed   Drinks 2-3 cups of caffeine daily   Social Determinants of Health   Financial Resource Strain: Not on file  Food Insecurity: Not on file  Transportation Needs: Not on file  Physical Activity: Not on file  Stress: Not on file  Social Connections: Not on file  Intimate Partner Violence: Not on file   No family history on file.  Objective: Office vital signs reviewed. BP 126/68    Pulse 64   Temp (!) 96.9 F (36.1 C) (Temporal)   Ht 5\' 9"  (1.753 m)   Wt 194 lb (88 kg)   SpO2 97%   BMI 28.65 kg/m   Physical Examination:  General: Awake, alert, nontoxic, No acute distress HEENT: Normal, sclera white, right nare with hemostatic bleed noted along the septum Cardio: regular rate and rhythm, S1S2 heard, no murmurs appreciated Pulm: clear to auscultation bilaterally, no wheezes, rhonchi or rales; normal work of breathing on room air GI: soft, mild epigastric tenderness, bloated, bowel sounds present x4, no hepatomegaly, no splenomegaly, no masses Extremities: warm, well perfused, No edema, cyanosis or clubbing; +2 pulses bilaterally MSK: normal gait and station Skin: dry; intact; no rashes or lesions Neuro: no tremor Psych: Mood stable, speech normal patient is pleasant and interactive  Assessment/ Plan: 75 y.o. male   GAD (generalized anxiety disorder)  Essential hypertension - Plan: lisinopril-hydrochlorothiazide (ZESTORETIC) 10-12.5 MG tablet  Mixed hyperlipidemia - Plan: simvastatin (ZOCOR) 20 MG tablet  Gastroesophageal reflux disease without esophagitis - Plan: esomeprazole (NEXIUM) 40 MG capsule  Irritable bowel syndrome with diarrhea  Anxiety disorder is stable with Lexapro 15 mg daily.  Could consider increasing to 20 mg daily at some point if needed  Blood pressures well controlled.  Continue current regimen  Continue statin  GERD is not well controlled.  Replace Tagamet with Nexium 40 mg daily.  I have also given him a 10-day supply of Dexilant to get him started on some sort of PPI whilst he awaits his mail order.  Physical exam was significant for epigastric tenderness, suggesting a gastritis  I have recommended probiotics for IBS-D.  We could consider Xifaxan if symptoms are refractory to the probiotics.  Follow-up in 1 month if no significant improvement in symptoms.  Handout provided for dietary changes.  No orders of the defined types  were placed in this encounter.  Meds ordered this encounter  Medications  . lisinopril-hydrochlorothiazide (ZESTORETIC) 10-12.5 MG tablet    Sig: Take 1 tablet by mouth daily.    Dispense:  90 tablet    Refill:  3  . simvastatin (ZOCOR) 20 MG tablet    Sig: Take 1 tablet (20 mg total) by mouth daily at 6 PM.    Dispense:  90 tablet    Refill:  3  . esomeprazole (NEXIUM) 40 MG capsule    Sig: Take 1 capsule (40 mg total) by mouth daily.    Dispense:  90 capsule    Refill:  3  . dexlansoprazole (DEXILANT) 60 MG capsule    Sig: Take 1 capsule (60 mg total) by mouth daily for 10 days.    Dispense:  10 capsule  Refill:  0    Ashly Hulen Skains, DO [facility] [phone]

## 2020-04-04 NOTE — Patient Instructions (Signed)
Nexium to replace Tagamet.  This is a ONCE daily medication.  Take before breakfast.  It will take a few days to work, so bare with it.  Culturelle or Align (generic fine) for gut/ diarrhea.  If no improvement in either, would like to see him back in 1 month.  We can talk about Xifaxan for diarrhea instead at that time  Saline nasal spray.   Diet for Irritable Bowel Syndrome When you have irritable bowel syndrome (IBS), it is very important to eat the foods and follow the eating habits that are best for your condition. IBS may cause various symptoms such as pain in the abdomen, constipation, or diarrhea. Choosing the right foods can help to ease the discomfort from these symptoms. Work with your health care provider and diet and nutrition specialist (dietitian) to find the eating plan that will help to control your symptoms. What are tips for following this plan?  Keep a food diary. This will help you identify foods that cause symptoms. Write down: ? What you eat and when you eat it. ? What symptoms you have. ? When symptoms occur in relation to your meals, such as "pain in abdomen 2 hours after dinner."  Eat your meals slowly and in a relaxed setting.  Aim to eat 5-6 small meals per day. Do not skip meals.  Drink enough fluid to keep your urine pale yellow.  Ask your health care provider if you should take an over-the-counter probiotic to help restore healthy bacteria in your gut (digestive tract). ? Probiotics are foods that contain good bacteria and yeasts.  Your dietitian may have specific dietary recommendations for you based on your symptoms. He or she may recommend that you: ? Avoid foods that cause symptoms. Talk with your dietitian about other ways to get the same nutrients that are in those problem foods. ? Avoid foods with gluten. Gluten is a protein that is found in rye, wheat, and barley. ? Eat more foods that contain soluble fiber. Examples of foods with high soluble fiber  include oats, seeds, and certain fruits and vegetables. Take a fiber supplement if directed by your dietitian. ? Reduce or avoid certain foods called FODMAPs. These are foods that contain carbohydrates that are hard to digest. Ask your doctor which foods contain these carbohydrates.      What foods are not recommended? The following are some foods and drinks that may make your symptoms worse:  Fatty foods, such as french fries.  Foods that contain gluten, such as pasta and cereal.  Dairy products, such as milk, cheese, and ice cream.  Chocolate.  Alcohol.  Products with caffeine, such as coffee.  Carbonated drinks, such as soda.  Foods that are high in FODMAPs. These include certain fruits and vegetables.  Products with sweeteners such as honey, high fructose corn syrup, sorbitol, and mannitol. The items listed above may not be a complete list of foods and beverages you should avoid. Contact a dietitian for more information.   What foods are good sources of fiber? Your health care provider or dietitian may recommend that you eat more foods that contain fiber. Fiber can help to reduce constipation and other IBS symptoms. Add foods with fiber to your diet a little at a time so your body can get used to them. Too much fiber at one time might cause gas and swelling of your abdomen. The following are some foods that are good sources of fiber:  Berries, such as raspberries, strawberries, and blueberries.  Tomatoes.  Carrots.  Brown rice.  Oats.  Seeds, such as chia and pumpkin seeds. The items listed above may not be a complete list of recommended sources of fiber. Contact your dietitian for more options. Where to find more information  International Foundation for Functional Gastrointestinal Disorders: www.iffgd.[employer] of Diabetes and Digestive and Kidney Diseases: CarFli[employer].tn Summary  When you have irritable bowel syndrome (IBS), it is very important  to eat the foods and follow the eating habits that are best for your condition.  IBS may cause various symptoms such as pain in the abdomen, constipation, or diarrhea.  Choosing the right foods can help to ease the discomfort that comes from symptoms.  Keep a food diary. This will help you identify foods that cause symptoms.  Your health care provider or diet and nutrition specialist (dietitian) may recommend that you eat more foods that contain fiber. This information is not intended to replace advice given to you by your health care provider. Make sure you discuss any questions you have with your health care provider. Document Revised: 09/10/2019 Document Reviewed: 09/10/2019 Elsevier Patient Education  2021 [employer].

## 2020-05-19 ENCOUNTER — Encounter: Payer: Self-pay | Admitting: Family Medicine

## 2020-05-19 ENCOUNTER — Ambulatory Visit (INDEPENDENT_AMBULATORY_CARE_PROVIDER_SITE_OTHER): Payer: Medicare HMO | Admitting: Family Medicine

## 2020-05-19 ENCOUNTER — Other Ambulatory Visit: Payer: Self-pay

## 2020-05-19 VITALS — BP 135/70 | HR 69 | Temp 97.6°F | Ht 69.0 in | Wt 196.0 lb

## 2020-05-19 DIAGNOSIS — F5101 Primary insomnia: Secondary | ICD-10-CM | POA: Diagnosis not present

## 2020-05-19 DIAGNOSIS — K219 Gastro-esophageal reflux disease without esophagitis: Secondary | ICD-10-CM | POA: Diagnosis not present

## 2020-05-19 DIAGNOSIS — Z1159 Encounter for screening for other viral diseases: Secondary | ICD-10-CM

## 2020-05-19 DIAGNOSIS — R140 Abdominal distension (gaseous): Secondary | ICD-10-CM | POA: Diagnosis not present

## 2020-05-19 MED ORDER — DEXILANT 60 MG PO CPDR: 60.0000 mg | DELAYED_RELEASE_CAPSULE | Freq: Every day | ORAL | 0 refills | Status: DC

## 2020-05-19 NOTE — Progress Notes (Signed)
Subjective: CC: IBS-D PCP: Raliegh Ip, DO EHU:[id number] Eric Cook is a 75 y.o. male presenting to clinic today for:  1.  Irritable bowel syndrome, diarrhea Patient presents to the office today by himself.  Patient was seen last month and was reporting symptoms consistent with an irritable bowel syndrome with diarrhea predominance.  He was recommended to start a probiotic and follow-up.  Today he notes that the diarrheal aspect of his GI issues has resolved.  He continues to experience bloating and frequent gas production where he will pass gas and or burp frequently.  He does report some epigastric pain despite use of Nexium.  Cannot recall if Dexilant did better than the Nexium.  He does not report any melena or hematochezia.  He is moving his bowels each day sometimes twice in the morning.  He is on the probiotics and Gas-X but has not really felt them to be super beneficial.  He admits that he does not eat a balanced diet but sometimes just the smell of certain foods turned him off and therefore he will not eat them.  Currently he has a taste preference for tacos.   ROS: Per HPI  No Known Allergies Past Medical History:  Diagnosis Date  . Anxiety   . Hyperlipidemia   . Hypertension   . Seizures (HCC)    medication related    Current Outpatient Medications:  .  cholecalciferol (VITAMIN D) 25 MCG (1000 UNIT) tablet, TAKE 1 TABLET EVERY DAY, Disp: 90 tablet, Rfl: 0 .  dexlansoprazole (DEXILANT) 60 MG capsule, Take 1 capsule (60 mg total) by mouth daily for 10 days., Disp: 10 capsule, Rfl: 0 .  Diaper Rash Products (DESITIN) OINT, Apply 1 application topically 3 (three) times daily as needed., Disp: 99 g, Rfl: 1 .  escitalopram (LEXAPRO) 10 MG tablet, Take 1.5 tablets (15 mg total) by mouth daily., Disp: 135 tablet, Rfl: 3 .  esomeprazole (NEXIUM) 40 MG capsule, Take 1 capsule (40 mg total) by mouth daily., Disp: 90 capsule, Rfl: 3 .  levETIRAcetam (KEPPRA) 250 MG tablet, Take 1  tablet (250 mg total) by mouth 2 (two) times daily., Disp: 180 tablet, Rfl: 3 .  lisinopril-hydrochlorothiazide (ZESTORETIC) 10-12.5 MG tablet, Take 1 tablet by mouth daily., Disp: 90 tablet, Rfl: 3 .  simvastatin (ZOCOR) 20 MG tablet, Take 1 tablet (20 mg total) by mouth daily at 6 PM., Disp: 90 tablet, Rfl: 3 .  tamsulosin (FLOMAX) 0.4 MG CAPS capsule, TAKE 1 CAPSULE EVERY DAY FOR URINARY SYMPTOMS, Disp: 90 capsule, Rfl: 3 Social History   Socioeconomic History  . Marital status: Single    Spouse name: Not on file  . Number of children: Not on file  . Years of education: Not on file  . Highest education level: Not on file  Occupational History  . Occupation: Lobbyist    Comment: part time  Tobacco Use  . Smoking status: Former Smoker    Packs/day: 0.50    Years: 30.00    Pack years: 15.00    Quit date: 04/2019    Years since quitting: 1.0  . Smokeless tobacco: Never Used  Vaping Use  . Vaping Use: Never used  Substance and Sexual Activity  . Alcohol use: Never  . Drug use: Never  . Sexual activity: Never  Other Topics Concern  . Not on file  Social History Narrative   Lives alone   Right Handed   Drinks 2-3 cups of caffeine daily   Social  Determinants of Health   Financial Resource Strain: Not on file  Food Insecurity: Not on file  Transportation Needs: Not on file  Physical Activity: Not on file  Stress: Not on file  Social Connections: Not on file  Intimate Partner Violence: Not on file   No family history on file.  Objective: Office vital signs reviewed. BP 135/70   Pulse 69   Temp 97.6 F (36.4 C)   Ht 5\' 9"  (1.753 m)   Wt 196 lb (88.9 kg)   SpO2 99%   BMI 28.94 kg/m   Physical Examination:  General: Awake, alert, well nourished, No acute distress Cardio: regular rate and rhythm, S1S2 heard, no murmurs appreciated Pulm: clear to auscultation bilaterally, no wheezes, rhonchi or rales; normal work of breathing on room air GI:  Protuberant.  Epigastric tenderness to palpation present.  No guarding or rebound  Assessment/ Plan: 75 y.o. male   Gastroesophageal reflux disease without esophagitis - Plan: DEXILANT 60 MG capsule  Need for hepatitis C screening test - Plan: Hepatitis C antibody  Abdominal bloating - Plan: DEXILANT 60 MG capsule  Primary insomnia  Trial of Dexilant.  Hopefully this will improve abdominal bloating and GERD.  He did have epigastric tenderness on palpation today.  His abdomen does appear bloated.  Neck step would be referral to GI.  He would be amenable to this.  We discussed FODMAP again but I am sure that he will likely not pursue this given issues with different foods lately  With regards to his insomnia, I discussed use of melatonin.  May use up to 10 mg daily if needed.  We also discussed naturopathic remedies for sleep including consumption of pistachios, use of chamomile tea and or warm milk.   No orders of the defined types were placed in this encounter.  No orders of the defined types were placed in this encounter.    67, DO [facility] [phone]

## 2020-05-19 NOTE — Patient Instructions (Signed)
Going to replace your Nexium with Dexilant.  Call your mail order to check on price before they ship since it is brand name.  Unfortunately no samples.   Low-FODMAP Eating Plan  FODMAP stands for fermentable oligosaccharides, disaccharides, monosaccharides, and polyols. These are sugars that are hard for some people to digest. A low-FODMAP eating plan may help some people who have irritable bowel syndrome (IBS) and certain other bowel (intestinal) diseases to manage their symptoms. This meal plan can be complicated to follow. Work with a diet and nutrition specialist (dietitian) to make a low-FODMAP eating plan that is right for you. A dietitian can help make sure that you get enough nutrition from this diet. What are tips for following this plan? Reading food labels  Check labels for hidden FODMAPs such as: ? High-fructose syrup. ? Honey. ? Agave. ? Natural fruit flavors. ? Onion or garlic powder.  Choose low-FODMAP foods that contain 3-4 grams of fiber per serving.  Check food labels for serving sizes. Eat only one serving at a time to make sure FODMAP levels stay low. Shopping  Shop with a list of foods that are recommended on this diet and make a meal plan. Meal planning  Follow a low-FODMAP eating plan for up to 6 weeks, or as told by your health care provider or dietitian.  To follow the eating plan: 1. Eliminate high-FODMAP foods from your diet completely. Choose only low-FODMAP foods to eat. You will do this for 2-6 weeks. 2. Gradually reintroduce high-FODMAP foods into your diet one at a time. Most people should wait a few days before introducing the next new high-FODMAP food into their meal plan. Your dietitian can recommend how quickly you may reintroduce foods. 3. Keep a daily record of what and how much you eat and drink. Make note of any symptoms that you have after eating. 4. Review your daily record with a dietitian regularly to identify which foods you can eat and  which foods you should avoid. General tips  Drink enough fluid each day to keep your urine pale yellow.  Avoid processed foods. These often have added sugar and may be high in FODMAPs.  Avoid most dairy products, whole grains, and sweeteners.  Work with a dietitian to make sure you get enough fiber in your diet.  Avoid high FODMAP foods at meals to manage symptoms. Recommended foods Fruits Bananas, oranges, tangerines, lemons, limes, blueberries, raspberries, strawberries, grapes, cantaloupe, honeydew melon, kiwi, papaya, passion fruit, and pineapple. Limited amounts of dried cranberries, banana chips, and shredded coconut. Vegetables Eggplant, zucchini, cucumber, peppers, green beans, bean sprouts, lettuce, arugula, kale, Swiss chard, spinach, collard greens, bok choy, summer squash, potato, and tomato. Limited amounts of corn, carrot, and sweet potato. Green parts of scallions. Grains Gluten-free grains, such as rice, oats, buckwheat, quinoa, corn, polenta, and millet. Gluten-free pasta, bread, or cereal. Rice noodles. Corn tortillas. Meats and other proteins Unseasoned beef, pork, poultry, or fish. Eggs. Tomasa Blase. Tofu (firm) and tempeh. Limited amounts of nuts and seeds, such as almonds, walnuts, [place] nuts, pecans, peanuts, nut butters, pumpkin seeds, chia seeds, and sunflower seeds. Dairy Lactose-free milk, yogurt, and kefir. Lactose-free cottage cheese and ice cream. Non-dairy milks, such as almond, coconut, hemp, and rice milk. Non-dairy yogurt. Limited amounts of goat cheese, brie, mozzarella, parmesan, swiss, and other hard cheeses. Fats and oils Butter-free spreads. Vegetable oils, such as olive, canola, and sunflower oil. Seasoning and other foods Artificial sweeteners with names that do not end in "ol," such as  aspartame, saccharine, and stevia. Maple syrup, white table sugar, raw sugar, brown sugar, and molasses. Mayonnaise, soy sauce, and tamari. Fresh basil, coriander,  parsley, rosemary, and thyme. Beverages Water and mineral water. Sugar-sweetened soft drinks. Small amounts of orange juice or cranberry juice. Black and green tea. Most dry wines. Coffee. The items listed above may not be a complete list of foods and beverages you can eat. Contact a dietitian for more information. Foods to avoid Fruits Fresh, dried, and juiced forms of apple, pear, watermelon, peach, plum, cherries, apricots, blackberries, boysenberries, figs, nectarines, and mango. Avocado. Vegetables Chicory root, artichoke, asparagus, cabbage, snow peas, Brussels sprouts, broccoli, sugar snap peas, mushrooms, celery, and cauliflower. Onions, garlic, leeks, and the white part of scallions. Grains Wheat, including kamut, durum, and semolina. Barley and bulgur. Couscous. Wheat-based cereals. Wheat noodles, bread, crackers, and pastries. Meats and other proteins Fried or fatty meat. Sausage. Cashews and pistachios. Soybeans, baked beans, black beans, chickpeas, kidney beans, fava beans, navy beans, lentils, black-eyed peas, and split peas. Dairy Milk, yogurt, ice cream, and soft cheese. Cream and sour cream. Milk-based sauces. Custard. Buttermilk. Soy milk. Seasoning and other foods Any sugar-free gum or candy. Foods that contain artificial sweeteners such as sorbitol, mannitol, isomalt, or xylitol. Foods that contain honey, high-fructose corn syrup, or agave. Bouillon, vegetable stock, beef stock, and chicken stock. Garlic and onion powder. Condiments made with onion, such as hummus, chutney, pickles, relish, salad dressing, and salsa. Tomato paste. Beverages Chicory-based drinks. Coffee substitutes. Chamomile tea. Fennel tea. Sweet or fortified wines such as port or sherry. Diet soft drinks made with isomalt, mannitol, maltitol, sorbitol, or xylitol. Apple, pear, and mango juice. Juices with high-fructose corn syrup. The items listed above may not be a complete list of foods and beverages you  should avoid. Contact a dietitian for more information. Summary  FODMAP stands for fermentable oligosaccharides, disaccharides, monosaccharides, and polyols. These are sugars that are hard for some people to digest.  A low-FODMAP eating plan is a short-term diet that helps to ease symptoms of certain bowel diseases.  The eating plan usually lasts up to 6 weeks. After that, high-FODMAP foods are reintroduced gradually and one at a time. This can help you find out which foods may be causing symptoms.  A low-FODMAP eating plan can be complicated. It is best to work with a dietitian who has experience with this type of plan. This information is not intended to replace advice given to you by your health care provider. Make sure you discuss any questions you have with your health care provider. Document Revised: 05/28/2019 Document Reviewed: 05/28/2019 Elsevier Patient Education  2021 [employer].

## 2020-05-20 LAB — HEPATITIS C ANTIBODY: Hep C Virus Ab: 0.2 s/co ratio (ref 0.0–0.9)

## 2020-05-30 ENCOUNTER — Other Ambulatory Visit: Payer: Self-pay | Admitting: Family Medicine

## 2020-06-15 ENCOUNTER — Other Ambulatory Visit: Payer: Self-pay | Admitting: Family Medicine

## 2020-06-15 ENCOUNTER — Telehealth: Payer: Self-pay | Admitting: Family Medicine

## 2020-06-15 DIAGNOSIS — K219 Gastro-esophageal reflux disease without esophagitis: Secondary | ICD-10-CM

## 2020-06-15 MED ORDER — ESOMEPRAZOLE MAGNESIUM 40 MG PO CPDR: 40.0000 mg | DELAYED_RELEASE_CAPSULE | Freq: Every day | ORAL | 3 refills | Status: DC

## 2020-06-15 NOTE — Telephone Encounter (Signed)
Please let the patient know that I sent their prescription to their pharmacy. Thanks, WS 

## 2020-06-15 NOTE — Telephone Encounter (Signed)
Dexilant is too expensive, wants to go back to Esomeprazole to [facility] pharmacy

## 2020-06-15 NOTE — Telephone Encounter (Signed)
  Prescription Request  06/15/2020  What is the name of the medication or equipment? Esomeparazole 40 mg He wants to go back on this one because the one she switched him too cost $250.00 a month  Have you contacted your pharmacy to request a refill? (if applicable) NO  Which pharmacy would you like this sent to? [facility] mail order   Patient notified that their request is being sent to the clinical staff for review and that they should receive a response within 2 business days.

## 2020-06-15 NOTE — Telephone Encounter (Signed)
PATIENT AWARE

## 2020-06-16 ENCOUNTER — Ambulatory Visit (INDEPENDENT_AMBULATORY_CARE_PROVIDER_SITE_OTHER): Payer: Medicare HMO

## 2020-06-16 VITALS — Ht 69.0 in | Wt 196.0 lb

## 2020-06-16 DIAGNOSIS — Z0000 Encounter for general adult medical examination without abnormal findings: Secondary | ICD-10-CM | POA: Diagnosis not present

## 2020-06-16 NOTE — Progress Notes (Signed)
Subjective:   Eric Cook is a 75 y.o. male who presents for Medicare Annual/Subsequent preventive examination.  Virtual Visit via Telephone Note  I connected with  Eric Cook on 06/16/20 at 11:15 AM EDT by telephone and verified that I am speaking with the correct person using two identifiers.  Location: Patient: Hom Provider: [facility] Persons participating in the virtual visit: patient/Nurse Health Advisor   I discussed the limitations, risks, security and privacy concerns of performing an evaluation and management service by telephone and the availability of in person appointments. The patient expressed understanding and agreed to proceed.  Interactive audio and video telecommunications were attempted between this nurse and patient, however failed, due to patient having technical difficulties OR patient did not have access to video capability.  We continued and completed visit with audio only.  Some vital signs may be absent or patient reported.   Eric Felkins E Katheline Brendlinger, LPN   Review of Systems     Cardiac Risk Factors include: advanced age (>3655men, 104>65 women);dyslipidemia;hypertension;male gender;smoking/ tobacco exposure     Objective:    Today's Vitals   06/16/20 1129  Weight: 196 lb (88.9 kg)  Height: 5\' 9"  (1.753 m)   Body mass index is 28.94 kg/m.  Advanced Directives 06/16/2020 08/25/2019  Does Patient Have a Medical Advance Directive? No No  Would patient like information on creating a medical advance directive? No - Patient declined No - Patient declined    Current Medications (verified) Outpatient Encounter Medications as of 06/16/2020  Medication Sig  . escitalopram (LEXAPRO) 10 MG tablet Take 1.5 tablets (15 mg total) by mouth daily.  Marland Kitchen. esomeprazole (NEXIUM) 40 MG capsule Take 1 capsule (40 mg total) by mouth daily.  Marland Kitchen. levETIRAcetam (KEPPRA) 250 MG tablet Take 1 tablet (250 mg total) by mouth 2 (two) times daily.  Marland Kitchen. lisinopril-hydrochlorothiazide (ZESTORETIC)  10-12.5 MG tablet Take 1 tablet by mouth daily.  . simvastatin (ZOCOR) 20 MG tablet Take 1 tablet (20 mg total) by mouth daily at 6 PM.  . tamsulosin (FLOMAX) 0.4 MG CAPS capsule TAKE 1 CAPSULE EVERY DAY FOR URINARY SYMPTOMS  . DEXILANT 60 MG capsule Take 1 capsule (60 mg total) by mouth daily. (Patient not taking: Reported on 06/16/2020)  . Diaper Rash Products (DESITIN) OINT Apply 1 application topically 3 (three) times daily as needed. (Patient not taking: Reported on 06/16/2020)   No facility-administered encounter medications on file as of 06/16/2020.    Allergies (verified) Patient has no known allergies.   History: Past Medical History:  Diagnosis Date  . Anxiety   . Hyperlipidemia   . Hypertension   . Seizures (HCC)    medication related   Past Surgical History:  Procedure Laterality Date  . BACK SURGERY     disc removal   History reviewed. No pertinent family history. Social History   Socioeconomic History  . Marital status: Single    Spouse name: Not on file  . Number of children: 1  . Years of education: Not on file  . Highest education level: Not on file  Occupational History  . Occupation: Lobbyistsmall equipment repair    Comment: part time  Tobacco Use  . Smoking status: Former Smoker    Packs/day: 0.50    Years: 30.00    Pack years: 15.00    Quit date: 04/2019    Years since quitting: 1.1  . Smokeless tobacco: Never Used  Vaping Use  . Vaping Use: Never used  Substance and Sexual Activity  .  Alcohol use: Never  . Drug use: Never  . Sexual activity: Never  Other Topics Concern  . Not on file  Social History Narrative   Lives alone   Right Handed   Drinks 2-3 cups of caffeine daily   Son in [place]   Social Determinants of Health   Financial Resource Strain: Low Risk   . Difficulty of Paying Living Expenses: Not hard at all  Food Insecurity: No Food Insecurity  . Worried About Programme researcher, broadcasting/film/video in the Last Year: Never true  . Ran Out of Food  in the Last Year: Never true  Transportation Needs: No Transportation Needs  . Lack of Transportation (Medical): No  . Lack of Transportation (Non-Medical): No  Physical Activity: Not on file  Stress: No Stress Concern Present  . Feeling of Stress : Only a little  Social Connections: Socially Isolated  . Frequency of Communication with Friends and Family: More than three times a week  . Frequency of Social Gatherings with Friends and Family: Twice a week  . Attends Religious Services: Never  . Active Member of Clubs or Organizations: No  . Attends Banker Meetings: Never  . Marital Status: Divorced    Tobacco Counseling Counseling given: Not Answered   Clinical Intake:  Pre-visit preparation completed: Yes  Pain : No/denies pain     BMI - recorded: 28.94 Nutritional Status: BMI 25 -29 Overweight Nutritional Risks: Nausea/ vomitting/ diarrhea (chronic nausea) Diabetes: No  How often do you need to have someone help you when you read instructions, pamphlets, or other written materials from your doctor or pharmacy?: 1 - Never  Diabetic? No  Interpreter Needed?: No  Information entered by :: Eric Delisle, LPN   Activities of Daily Living In your present state of health, do you have any difficulty performing the following activities: 06/16/2020 08/25/2019  Hearing? N -  Vision? N -  Difficulty concentrating or making decisions? N -  Walking or climbing stairs? N -  Dressing or bathing? N -  Doing errands, shopping? N N  Preparing Food and eating ? N -  Using the Toilet? N -  In the past six months, have you accidently leaked urine? N -  Do you have problems with loss of bowel control? N -  Managing your Medications? N -  Managing your Finances? N -  Housekeeping or managing your Housekeeping? N -  Some recent data might be hidden    Patient Care Team: Raliegh Ip, DO as PCP - General (Family Medicine) York Spaniel, MD as Consulting  Physician (Neurology) Crist Fat, MD as Attending Physician (Urology)  Indicate any recent Medical Services you may have received from other than [facility] providers in the past year (date may be approximate).     Assessment:   This is a routine wellness examination for Eric Cook.  Hearing/Vision screen  Hearing Screening   125Hz  250Hz  500Hz  1000Hz  2000Hz  3000Hz  4000Hz  6000Hz  8000Hz   Right ear:           Left ear:           Comments: Denies hearing difficulties   Vision Screening Comments: Wears glasses - Sees [facility] in [place] [facility], doesn't have routine visits. Last eye exam ~2019  Dietary issues and exercise activities discussed: Current Exercise Habits: Home exercise routine, Type of exercise: walking, Time (Minutes): 30, Frequency (Times/Week): 5, Weekly Exercise (Minutes/Week): 150, Intensity: Mild, Exercise limited by: orthopedic condition(s)  Goals Addressed  This Visit's Progress   . Exercise 3x per week (30 min per time)       He wants to stay active and healthy; Get appetite back (has lost smell and taste since hospital visit in 08/2019 - possible seizure)      Depression Screen PHQ 2/9 Scores 06/16/2020 05/19/2020 04/04/2020 02/02/2020 12/01/2019 10/14/2019 09/08/2019  PHQ - 2 Score 1 1 1 1  0 0 3  PHQ- 9 Score 6 6 5 4  - 2 11    Fall Risk Fall Risk  06/16/2020 05/19/2020 04/04/2020 02/02/2020 12/01/2019  Falls in the past year? 0 0 0 0 0  Number falls in past yr: 0 - - - -  Injury with Fall? 0 - - - -  Risk for fall due to : History of fall(s);Orthopedic patient;Other (Comment) - - - -  Risk for fall due to: Comment hx of seizure activity - - - -  Follow up Education provided;Falls prevention discussed - - - -    FALL RISK PREVENTION PERTAINING TO THE HOME:  Any stairs in or around the home? No  If so, are there any without handrails? No  Home free of loose throw rugs in walkways, pet beds, electrical cords, etc? Yes  Adequate lighting in your home to  reduce risk of falls? Yes   ASSISTIVE DEVICES UTILIZED TO PREVENT FALLS:  Life alert? No  Use of a cane, walker or w/c? No  Grab bars in the bathroom? No  Shower chair or bench in shower? No  Elevated toilet seat or a handicapped toilet? No   TIMED UP AND GO:  Was the test performed? No . Telephonic visit.  Cognitive Function: Normal cognitive status assessed by direct observation by this Nurse Health Advisor. No abnormalities found.     6CIT Screen 06/16/2020  What Year? 0 points  What month? 0 points  What time? 0 points  Count back from 20 0 points  Months in reverse 2 points  Repeat phrase 2 points  Total Score 4    Immunizations Immunization History  Administered Date(s) Administered  . Fluad Quad(high Dose 65+) 10/14/2019  . Moderna Sars-Covid-2 Vaccination 03/18/2019, 04/15/2019, 12/01/2019  . Pneumococcal Conjugate-13 10/14/2019    TDAP status: Due, Education has been provided regarding the importance of this vaccine. Advised may receive this vaccine at local pharmacy or Health Dept. Aware to provide a copy of the vaccination record if obtained from local pharmacy or Health Dept. Verbalized acceptance and understanding.  Flu Vaccine status: Up to date  Pneumococcal vaccine status: Up to date  Covid-19 vaccine status: Completed vaccines  Qualifies for Shingles Vaccine? Yes   Zostavax completed No   Shingrix Completed?: No.    Education has been provided regarding the importance of this vaccine. Patient has been advised to call insurance company to determine out of pocket expense if they have not yet received this vaccine. Advised may also receive vaccine at local pharmacy or Health Dept. Verbalized acceptance and understanding.  Screening Tests Health Maintenance  Topic Date Due  . Zoster Vaccines- Shingrix (1 of 2) Never done  . COLONOSCOPY (Pts 45-63yrs Insurance coverage will need to be confirmed)  05/19/2021 (Originally 03/12/1990)  . INFLUENZA VACCINE   08/22/2020  . PNA vac Low Risk Adult (2 of 2 - PPSV23) 10/13/2020  . COVID-19 Vaccine  Completed  . Hepatitis C Screening  Completed  . HPV VACCINES  Aged Out  . TETANUS/TDAP  Discontinued    Health Maintenance  Health Maintenance Due  Topic Date Due  . Zoster Vaccines- Shingrix (1 of 2) Never done    Colorectal cancer screening: Type of screening: Cologuard. Completed 2020. Repeat every 3 yearsHis insurance company sends this in the mail.  Lung Cancer Screening: (Low Dose CT Chest recommended if Age 54-80 years, 30 pack-year currently smoking OR have quit w/in 15years.) does not qualify.   Additional Screening:  Hepatitis C Screening: does qualify; Completed 05/19/2020  Vision Screening: Recommended annual ophthalmology exams for early detection of glaucoma and other disorders of the eye. Is the patient up to date with their annual eye exam?  No  Who is the provider or what is the name of the office in which the patient attends annual eye exams? [facility] in [place] If pt is not established with a provider, would they like to be referred to a provider to establish care? No .   Dental Screening: Recommended annual dental exams for proper oral hygiene  Community Resource Referral / Chronic Care Management: CRR required this visit?  No   CCM required this visit?  No      Plan:     I have personally reviewed and noted the following in the patient's chart:   . Medical and social history . Use of alcohol, tobacco or illicit drugs  . Current medications and supplements including opioid prescriptions. Patient is not currently taking opioid prescriptions. . Functional ability and status . Nutritional status . Physical activity . Advanced directives . List of other physicians . Hospitalizations, surgeries, and ER visits in previous 12 months . Vitals . Screenings to include cognitive, depression, and falls . Referrals and appointments  In addition, I have reviewed and  discussed with patient certain preventive protocols, quality metrics, and best practice recommendations. A written personalized care plan for preventive services as well as general preventive health recommendations were provided to patient.     [place] Constable, LPN   08/31/1749   Nurse Notes: Patient concerns: he has a chronic dry cough since hospital visit 08/2019; also has poor appetite since then and can't smell or taste much. - He is concerned these are side effects from Keppra.

## 2020-06-16 NOTE — Patient Instructions (Signed)
Eric Cook , Thank you for taking time to come for your Medicare Wellness Visit. I appreciate your ongoing commitment to your health goals. Please review the following plan we discussed and let me know if I can assist you in the future.   Screening recommendations/referrals: Colonoscopy: Insurance company sends Cologuard every 3 years Recommended yearly ophthalmology/optometry visit for glaucoma screening and checkup Recommended yearly dental visit for hygiene and checkup  Vaccinations: Influenza vaccine: Done 10/14/2019 - Repeat annually Pneumococcal vaccine: Prevnar done 10/14/2019; Due for Pneumovax Tdap vaccine: Due (every 10 years) Shingles vaccine: Due Shingrix discussed. Please contact your pharmacy for coverage information.    Covid-19: Done 03/18/2019, 04/15/2019, & 12/01/2019  Advanced directives: Please bring a copy of your health care power of attorney and living will to the office to be added to your chart at your convenience.  Conditions/risks identified: Aim for 30 minutes of exercise or brisk walking each day, drink 6-8 glasses of water and eat lots of fruits and vegetables.  Next appointment: Follow up in one year for your annual wellness visit.   Preventive Care 22 Years and Older, Male  Preventive care refers to lifestyle choices and visits with your health care provider that can promote health and wellness. What does preventive care include?  A yearly physical exam. This is also called an annual well check.  Dental exams once or twice a year.  Routine eye exams. Ask your health care provider how often you should have your eyes checked.  Personal lifestyle choices, including:  Daily care of your teeth and gums.  Regular physical activity.  Eating a healthy diet.  Avoiding tobacco and drug use.  Limiting alcohol use.  Practicing safe sex.  Taking low doses of aspirin every day.  Taking vitamin and mineral supplements as recommended by your health care  provider. What happens during an annual well check? The services and screenings done by your health care provider during your annual well check will depend on your age, overall health, lifestyle risk factors, and family history of disease. Counseling  Your health care provider may ask you questions about your:  Alcohol use.  Tobacco use.  Drug use.  Emotional well-being.  Home and relationship well-being.  Sexual activity.  Eating habits.  History of falls.  Memory and ability to understand (cognition).  Work and work Astronomer. Screening  You may have the following tests or measurements:  Height, weight, and BMI.  Blood pressure.  Lipid and cholesterol levels. These may be checked every 5 years, or more frequently if you are over 76 years old.  Skin check.  Lung cancer screening. You may have this screening every year starting at age 2 if you have a 30-pack-year history of smoking and currently smoke or have quit within the past 15 years.  Fecal occult blood test (FOBT) of the stool. You may have this test every year starting at age 93.  Flexible sigmoidoscopy or colonoscopy. You may have a sigmoidoscopy every 5 years or a colonoscopy every 10 years starting at age 18.  Prostate cancer screening. Recommendations will vary depending on your family history and other risks.  Hepatitis C blood test.  Hepatitis B blood test.  Sexually transmitted disease (STD) testing.  Diabetes screening. This is done by checking your blood sugar (glucose) after you have not eaten for a while (fasting). You may have this done every 1-3 years.  Abdominal aortic aneurysm (AAA) screening. You may need this if you are a current or former smoker.  Osteoporosis. You may be screened starting at age 15 if you are at high risk. Talk with your health care provider about your test results, treatment options, and if necessary, the need for more tests. Vaccines  Your health care provider  may recommend certain vaccines, such as:  Influenza vaccine. This is recommended every year.  Tetanus, diphtheria, and acellular pertussis (Tdap, Td) vaccine. You may need a Td booster every 10 years.  Zoster vaccine. You may need this after age 76.  Pneumococcal 13-valent conjugate (PCV13) vaccine. One dose is recommended after age 39.  Pneumococcal polysaccharide (PPSV23) vaccine. One dose is recommended after age 66. Talk to your health care provider about which screenings and vaccines you need and how often you need them. This information is not intended to replace advice given to you by your health care provider. Make sure you discuss any questions you have with your health care provider. Document Released: 02/04/2015 Document Revised: 09/28/2015 Document Reviewed: 11/09/2014 Elsevier Interactive Patient Education  2017 [place] Prevention in the Home Falls can cause injuries. They can happen to people of all ages. There are many things you can do to make your home safe and to help prevent falls. What can I do on the outside of my home?  Regularly fix the edges of walkways and driveways and fix any cracks.  Remove anything that might make you trip as you walk through a door, such as a raised step or threshold.  Trim any bushes or trees on the path to your home.  Use bright outdoor lighting.  Clear any walking paths of anything that might make someone trip, such as rocks or tools.  Regularly check to see if handrails are loose or broken. Make sure that both sides of any steps have handrails.  Any raised decks and porches should have guardrails on the edges.  Have any leaves, snow, or ice cleared regularly.  Use sand or salt on walking paths during winter.  Clean up any spills in your garage right away. This includes oil or grease spills. What can I do in the bathroom?  Use night lights.  Install grab bars by the toilet and in the tub and shower. Do not use  towel bars as grab bars.  Use non-skid mats or decals in the tub or shower.  If you need to sit down in the shower, use a plastic, non-slip stool.  Keep the floor dry. Clean up any water that spills on the floor as soon as it happens.  Remove soap buildup in the tub or shower regularly.  Attach bath mats securely with double-sided non-slip rug tape.  Do not have throw rugs and other things on the floor that can make you trip. What can I do in the bedroom?  Use night lights.  Make sure that you have a light by your bed that is easy to reach.  Do not use any sheets or blankets that are too big for your bed. They should not hang down onto the floor.  Have a firm chair that has side arms. You can use this for support while you get dressed.  Do not have throw rugs and other things on the floor that can make you trip. What can I do in the kitchen?  Clean up any spills right away.  Avoid walking on wet floors.  Keep items that you use a lot in easy-to-reach places.  If you need to reach something above you, use a strong step  stool that has a grab bar.  Keep electrical cords out of the way.  Do not use floor polish or wax that makes floors slippery. If you must use wax, use non-skid floor wax.  Do not have throw rugs and other things on the floor that can make you trip. What can I do with my stairs?  Do not leave any items on the stairs.  Make sure that there are handrails on both sides of the stairs and use them. Fix handrails that are broken or loose. Make sure that handrails are as long as the stairways.  Check any carpeting to make sure that it is firmly attached to the stairs. Fix any carpet that is loose or worn.  Avoid having throw rugs at the top or bottom of the stairs. If you do have throw rugs, attach them to the floor with carpet tape.  Make sure that you have a light switch at the top of the stairs and the bottom of the stairs. If you do not have them, ask  someone to add them for you. What else can I do to help prevent falls?  Wear shoes that:  Do not have high heels.  Have rubber bottoms.  Are comfortable and fit you well.  Are closed at the toe. Do not wear sandals.  If you use a stepladder:  Make sure that it is fully opened. Do not climb a closed stepladder.  Make sure that both sides of the stepladder are locked into place.  Ask someone to hold it for you, if possible.  Clearly mark and make sure that you can see:  Any grab bars or handrails.  First and last steps.  Where the edge of each step is.  Use tools that help you move around (mobility aids) if they are needed. These include:  Canes.  Walkers.  Scooters.  Crutches.  Turn on the lights when you go into a dark area. Replace any light bulbs as soon as they burn out.  Set up your furniture so you have a clear path. Avoid moving your furniture around.  If any of your floors are uneven, fix them.  If there are any pets around you, be aware of where they are.  Review your medicines with your doctor. Some medicines can make you feel dizzy. This can increase your chance of falling. Ask your doctor what other things that you can do to help prevent falls. This information is not intended to replace advice given to you by your health care provider. Make sure you discuss any questions you have with your health care provider. Document Released: 11/04/2008 Document Revised: 06/16/2015 Document Reviewed: 02/12/2014 Elsevier Interactive Patient Education  2017 [employer].

## 2020-06-25 ENCOUNTER — Encounter: Payer: Self-pay | Admitting: Family Medicine

## 2020-06-28 ENCOUNTER — Telehealth: Payer: Self-pay | Admitting: Family Medicine

## 2020-06-28 ENCOUNTER — Ambulatory Visit (INDEPENDENT_AMBULATORY_CARE_PROVIDER_SITE_OTHER): Payer: Medicare HMO | Admitting: Family Medicine

## 2020-06-28 DIAGNOSIS — J029 Acute pharyngitis, unspecified: Secondary | ICD-10-CM | POA: Diagnosis not present

## 2020-06-28 DIAGNOSIS — J040 Acute laryngitis: Secondary | ICD-10-CM

## 2020-06-28 DIAGNOSIS — R053 Chronic cough: Secondary | ICD-10-CM

## 2020-06-28 DIAGNOSIS — Z87891 Personal history of nicotine dependence: Secondary | ICD-10-CM

## 2020-06-28 MED ORDER — GUAIFENESIN-CODEINE 100-10 MG/5ML PO SOLN: 5.0000 mL | Freq: Three times a day (TID) | ORAL | 0 refills | Status: DC | PRN

## 2020-06-28 NOTE — Progress Notes (Signed)
Telephone visit  Subjective: CC: URI PCP: Raliegh Ip, DO TIW:[id number] R Mcgowan is a 75 y.o. male calls for telephone consult today. Patient provides verbal consent for consult held via phone.  Due to COVID-19 pandemic this visit was conducted virtually. This visit type was conducted due to national recommendations for restrictions regarding the COVID-19 Pandemic (e.g. social distancing, sheltering in place) in an effort to limit this patient's exposure and mitigate transmission in our community. All issues noted in this document were discussed and addressed.  A physical exam was not performed with this format.   Location of patient: home Location of provider: [facility] Others present for call: none  1.  URI Patient developed a sore throat, cough and if he talks a long time he loses his voice.  He denies fevers, diarrhea, myalgia.  Loses his breath during coughing spells but no shortness of breath or wheezing otherwise.  Symptoms started shortly after discharge from hospital and have been intermittent ever since, occurring on average every couple of days.  No reports of hemoptysis, unplanned weight loss or night sweats.  He is a former smoker that quit approximately 2 years ago.  ROS: Per HPI  No Known Allergies Past Medical History:  Diagnosis Date  . Anxiety   . Hyperlipidemia   . Hypertension   . Seizures (HCC)    medication related    Current Outpatient Medications:  .  DEXILANT 60 MG capsule, Take 1 capsule (60 mg total) by mouth daily. (Patient not taking: Reported on 06/16/2020), Disp: 90 capsule, Rfl: 0 .  Diaper Rash Products (DESITIN) OINT, Apply 1 application topically 3 (three) times daily as needed. (Patient not taking: Reported on 06/16/2020), Disp: 99 g, Rfl: 1 .  escitalopram (LEXAPRO) 10 MG tablet, Take 1.5 tablets (15 mg total) by mouth daily., Disp: 135 tablet, Rfl: 3 .  esomeprazole (NEXIUM) 40 MG capsule, Take 1 capsule (40 mg total) by mouth daily., Disp: 90  capsule, Rfl: 3 .  levETIRAcetam (KEPPRA) 250 MG tablet, Take 1 tablet (250 mg total) by mouth 2 (two) times daily., Disp: 180 tablet, Rfl: 3 .  lisinopril-hydrochlorothiazide (ZESTORETIC) 10-12.5 MG tablet, Take 1 tablet by mouth daily., Disp: 90 tablet, Rfl: 3 .  simvastatin (ZOCOR) 20 MG tablet, Take 1 tablet (20 mg total) by mouth daily at 6 PM., Disp: 90 tablet, Rfl: 3 .  tamsulosin (FLOMAX) 0.4 MG CAPS capsule, TAKE 1 CAPSULE EVERY DAY FOR URINARY SYMPTOMS, Disp: 90 capsule, Rfl: 0  Assessment/ Plan: 75 y.o. male   Laryngitis - Plan: Ambulatory referral to ENT  Chronic cough - Plan: Ambulatory referral to ENT  Sore throat - Plan: Ambulatory referral to ENT  Former smoker - Plan: Ambulatory referral to ENT  Given recurrent laryngitis, cough, sore throat and this former smoker I am referring him to ENT to make sure that we do not have any growths occurring within the vocal cords or upper airway.  His GERD is stable with Nexium so I doubt that this is related to uncontrolled acid reflux.  May consider referral to pulmonology if his ENT evaluation is unremarkable.  He understands our plan.  In the meantime I have sent him over a cough syrup to help with symptom control.  Caution sedation as it does contain codeine.  The Narcotic Database has been reviewed.  There were no red flags.     Start time: 10:28am End time: 10:41am  Total time spent on patient care (including telephone call/ virtual visit): 13 minutes  Janora Norlander, DO [facility] [phone]

## 2020-06-28 NOTE — Telephone Encounter (Signed)
REFERRAL REQUEST Telephone Note  Have you been seen at our office for this problem? yes (Advise that they may need an appointment with their PCP before a referral can be done)  Reason for Referral:  Referral discussed with patient: yes Best contact number of patient for referral team: [phone]   Has patient been seen by a specialist for this issue before:  Patient provider preference for referral: dr Carolyne Fiscal Patient location preference for referral: [facility]   Patient notified that referrals can take up to a week or longer to process. If they haven't heard anything within a week they should call back and speak with the referral department.

## 2020-07-04 ENCOUNTER — Telehealth: Payer: Self-pay | Admitting: Family Medicine

## 2020-08-09 ENCOUNTER — Other Ambulatory Visit: Payer: Self-pay | Admitting: Otolaryngology

## 2020-08-09 ENCOUNTER — Other Ambulatory Visit (HOSPITAL_COMMUNITY): Payer: Self-pay | Admitting: Otolaryngology

## 2020-08-09 DIAGNOSIS — R490 Dysphonia: Secondary | ICD-10-CM | POA: Diagnosis not present

## 2020-08-09 DIAGNOSIS — R070 Pain in throat: Secondary | ICD-10-CM | POA: Diagnosis not present

## 2020-08-09 DIAGNOSIS — R1312 Dysphagia, oropharyngeal phase: Secondary | ICD-10-CM | POA: Diagnosis not present

## 2020-08-09 DIAGNOSIS — R1310 Dysphagia, unspecified: Secondary | ICD-10-CM

## 2020-08-09 DIAGNOSIS — K219 Gastro-esophageal reflux disease without esophagitis: Secondary | ICD-10-CM | POA: Diagnosis not present

## 2020-08-15 ENCOUNTER — Ambulatory Visit (HOSPITAL_COMMUNITY)
Admission: RE | Admit: 2020-08-15 | Discharge: 2020-08-15 | Disposition: A | Discharge status: Home / Self Care | Payer: Medicare HMO | Source: Ambulatory Visit | Attending: Otolaryngology | Admitting: Otolaryngology

## 2020-08-15 ENCOUNTER — Other Ambulatory Visit: Payer: Self-pay

## 2020-08-15 DIAGNOSIS — R1310 Dysphagia, unspecified: Secondary | ICD-10-CM | POA: Insufficient documentation

## 2020-08-15 DIAGNOSIS — K219 Gastro-esophageal reflux disease without esophagitis: Secondary | ICD-10-CM | POA: Diagnosis not present

## 2020-08-22 ENCOUNTER — Ambulatory Visit: Payer: Medicare HMO | Admitting: Adult Health

## 2020-08-22 ENCOUNTER — Encounter: Payer: Self-pay | Admitting: Adult Health

## 2020-08-22 VITALS — BP 128/74 | HR 67 | Ht 69.0 in | Wt 199.1 lb

## 2020-08-22 DIAGNOSIS — G40219 Localization-related (focal) (partial) symptomatic epilepsy and epileptic syndromes with complex partial seizures, intractable, without status epilepticus: Secondary | ICD-10-CM | POA: Diagnosis not present

## 2020-08-22 MED ORDER — LEVETIRACETAM ER 500 MG PO TB24: 500.0000 mg | ORAL_TABLET | Freq: Every day | ORAL | 5 refills | Status: DC

## 2020-08-22 NOTE — Progress Notes (Signed)
[facility] [address] [place]. [place] [address] [phone]       OFFICE FOLLOW UP NOTE  Eric Cook Date of Birth:  05-18-1945 Medical Record Number:  [id number]   Referring MD: Doylene Canard, DO  Reason for Referral: Seizure   Chief Complaint  Patient presents with   Follow-up    Rm 3 with son reports he has been doing ok, but would like to discuss if he could stop taking the Keppra.       HPI:   Today, 08/22/2020, Eric Cook returns for 34-month seizure follow-up accompanied by his son.  He has been doing well since prior visit without any seizure activity and Keppra 250 mg twice daily tolerating without side effects.  He does question possibility of stopping Keppra.  Repeat EEG 02/2020 normal without evidence of seizure activity.  He has since returned back to driving without any difficulty.  Maintains ADLs and IADLs independently.  He routinely follows with his PCP Dr. Nadine Counts every 3 to 4 months - has appt tomorrow and believes routine lab work will be completed at that time.  No further concerns at this time.    History provided for reference purposes only Update 02/22/2020 JM: Eric Cook returns for seizure follow-up accompanied by his son.  He has remained on Keppra 250 mg twice daily tolerating well without side effects and has not had any additional seizure type activity.  Recommended repeat EEG not yet completed -patient and son thought today's visit was to have this completed.  He questions return back to driving as it has been 6 months since any seizure activity.  No concerns at this time.  Initial consult visit 11/10/2019 Dr. Pearlean Brownie: Eric Cook is a 75 year old Caucasian male with past medical history of hypertension, hyperlipidemia and anxiety who is seen today for initial office consultation visit for new onset seizures.  He is accompanied by his son Eric Cook today.  History is obtained from them, review of electronic medical records and I  personally reviewed available imaging films in PACS.  Patient had an episode on 08/24/2019 of sudden onset of speech difficulties.  He blames this on having stopped Xanax suddenly which she had been taking long-term 2 days prior.  He had also been unable to sleep the night before.  His son noticed that his speech did not make sense.  The patient called his son over the phone and kept on repeating saying I want to kill [name].  And the son came to his house and noticed him to be confused he was also haddock involuntary rubbing movement of his hands automatically.  He appeared disoriented and had word finding difficulties and trouble completing sentences.  These symptoms lasted about 24 hours and gradually improve the next day.  He was admitted to [facility] where MRI scan of the brain showed no acute abnormality but showed mild generalized atrophy and old left basal ganglia lacune.  EEG on 08/24/2019 showed focal left frontotemporal slowing in the 3 to 6 Hz range.  CBC, BMP and urine drug screen were negative except for benzos.  Patient was seen by Dr. Gerilyn Pilgrim neurologist who had strong suspicion for complex partial seizure and started up with patient on Keppra.  Patient has been taking Keppra 250 mg twice daily which seems to be tolerating well and has had no further episodes like this since he left the hospital.  He is been having some pain in his pelvis related to his prostate and  has an upcoming appointment to see a urologist for that.  He denies any prior history of seizures, significant head injury with loss of consciousness, strokes or TIA.  There is no family history of epilepsy.  He denies drinking alcohol, using marijuana or drugs of abuse.  ROS:   14 system review of systems is positive for those listed in HPI and all other systems negative  PMH:  Past Medical History:  Diagnosis Date   Anxiety    Hyperlipidemia    Hypertension    Seizures (HCC)    medication related    Social History:   Social History   Socioeconomic History   Marital status: Single    Spouse name: Not on file   Number of children: 1   Years of education: Not on file   Highest education level: Not on file  Occupational History   Occupation: Lobbyist    Comment: part time  Tobacco Use   Smoking status: Former    Packs/day: 0.50    Years: 30.00    Pack years: 15.00    Types: Cigarettes    Quit date: 04/2019    Years since quitting: 1.3   Smokeless tobacco: Never  Vaping Use   Vaping Use: Never used  Substance and Sexual Activity   Alcohol use: Never   Drug use: Never   Sexual activity: Never  Other Topics Concern   Not on file  Social History Narrative   Lives alone   Right Handed   Drinks 2-3 cups of caffeine daily   Son in [place]   Social Determinants of Health   Financial Resource Strain: Low Risk    Difficulty of Paying Living Expenses: Not hard at all  Food Insecurity: No Food Insecurity   Worried About Programme researcher, broadcasting/film/video in the Last Year: Never true   Ran Out of Food in the Last Year: Never true  Transportation Needs: No Transportation Needs   Lack of Transportation (Medical): No   Lack of Transportation (Non-Medical): No  Physical Activity: Not on file  Stress: No Stress Concern Present   Feeling of Stress : Only a little  Social Connections: Socially Isolated   Frequency of Communication with Friends and Family: More than three times a week   Frequency of Social Gatherings with Friends and Family: Twice a week   Attends Religious Services: Never   Database administrator or Organizations: No   Attends Engineer, structural: Never   Marital Status: Divorced  Catering manager Violence: Not At Risk   Fear of Current or Ex-Partner: No   Emotionally Abused: No   Physically Abused: No   Sexually Abused: No    Medications:   Current Outpatient Medications on File Prior to Visit  Medication Sig Dispense Refill   escitalopram (LEXAPRO) 10 MG  tablet Take 1.5 tablets (15 mg total) by mouth daily. 135 tablet 3   esomeprazole (NEXIUM) 40 MG capsule Take 1 capsule (40 mg total) by mouth daily. 90 capsule 3   levETIRAcetam (KEPPRA) 250 MG tablet Take 1 tablet (250 mg total) by mouth 2 (two) times daily. 180 tablet 3   lisinopril-hydrochlorothiazide (ZESTORETIC) 10-12.5 MG tablet Take 1 tablet by mouth daily. 90 tablet 3   simvastatin (ZOCOR) 20 MG tablet Take 1 tablet (20 mg total) by mouth daily at 6 PM. 90 tablet 3   tamsulosin (FLOMAX) 0.4 MG CAPS capsule TAKE 1 CAPSULE EVERY DAY FOR URINARY SYMPTOMS 90 capsule 0   No  current facility-administered medications on file prior to visit.    Allergies:  No Known Allergies  Physical Exam Today's Vitals   08/22/20 0741  BP: 128/74  Pulse: 67  SpO2: 92%  Weight: 199 lb 2 oz (90.3 kg)  Height: 5\' 9"  (1.753 m)    Body mass index is 29.41 kg/m.    General: well developed, well nourished, pleasant elderly Caucasian male, seated, in no evident distress Head: head normocephalic and atraumatic.   Neck: supple with no carotid or supraclavicular bruits Cardiovascular: regular rate and rhythm, no murmurs Musculoskeletal: no deformity Skin:  no rash/petichiae Vascular:  Normal pulses all extremities  Neurologic Exam Mental Status: Awake and fully alert.  Fluent speech and language.  Oriented to place and time. Recent and remote memory intact. Attention span, concentration and fund of knowledge appropriate. Mood and affect appropriate.    Cranial Nerves: Pupils equal, briskly reactive to light. Extraocular movements full without nystagmus. Visual fields full to confrontation. Hearing intact. Facial sensation intact. Face, tongue, palate moves normally and symmetrically.  Motor: Normal bulk and tone. Normal strength in all tested extremity muscles. Sensory.: intact to touch , pinprick , position and vibratory sensation.  Coordination: Rapid alternating movements normal in all extremities.  Finger-to-nose and heel-to-shin performed accurately bilaterally. Gait and Station: Arises from chair without difficulty. Stance is normal. Gait demonstrates normal stride length and balance . Able to heel, toe and tandem walk with slight difficulty.  Reflexes: 1+ and symmetric. Toes downgoing.        ASSESSMENT/PLAN: 75 year old Caucasian male with recurrent transient episodes of speech disturbance, automatisms and confusion disorientation likely complex partial seizures on 08/23/2019.  Unremarkable brain imaging but abnormal EEG showing focal left frontotemporal slowing.    1. Partial symptomatic epilepsy with complex partial seizures, not intractable, without status epilepticus (HCC) -Discussed weaning Keppra with possibility of discontinuing per patient request.  Discussed possibility of seizures if medication further reduced and/or discontinued as well as no driving while weaning dosage, and once completely stopped, no driving for 6 months per [place] law.  Due to these restrictions and as he wishes to continue to drive and remain independent, he would like to continue keppra -To help reduce pill burden, recommend transitioning to Keppra XR 500 mg nightly (previously IR 250 mg twice daily) for seizure prophylaxis -Request routine lab work including CBC and CMP tomorrow with PCP with ongoing use of Keppra -EEG 08/24/2019 cortical dysfunction in the left frontotemporal region possibly postictal state -EEG 03/07/2020 normal without evidence of seizures -MR brain 221 small remote left basal ganglia infarct, moderate cerebral atrophy and mild chronic microvascular ischemic changes -advised to avoid seizure provoking triggers like abrupt medication discontinuation, medication noncompliance, alcohol, extremes of activity and exertion and abrupt changes in diet    Follow-up in 6 months or call earlier if needed   CC:  [facility] provider: Dr. 03/09/2020, Barbie Haggis, DO   I spent 23 minutes of  face-to-face and non-face-to-face time with patient and son.  This included previsit chart review, lab review, study review, order entry, electronic health record documentation, patient and son education and discussion regarding history of seizures, ongoing use of AED and possible discontinuing and answered all other questions to patient and son satisfaction  Rozell Searing, AGNP-BC  [facility] [address] [place], [place]  Phone [phone] Fax [phone] Note: This document was prepared with digital dictation and possible smart phrase technology. Any transcriptional errors that result from this process are unintentional.

## 2020-08-22 NOTE — Patient Instructions (Addendum)
Your Plan:  Will change keppra 240mg  twice daily to Keppra XR (extended release) 500mg  nightly    Follow up in 6 months or call earlier if needed    Thank you for coming to see at [facility]. I hope we have been able to provide you high quality care today.  You may receive a patient satisfaction survey over the next few weeks. We would appreciate your feedback and comments so that we may continue to improve ourselves and the health of our patients.

## 2020-08-23 ENCOUNTER — Ambulatory Visit (INDEPENDENT_AMBULATORY_CARE_PROVIDER_SITE_OTHER): Payer: Medicare HMO | Admitting: Family Medicine

## 2020-08-23 ENCOUNTER — Encounter: Payer: Self-pay | Admitting: Family Medicine

## 2020-08-23 ENCOUNTER — Other Ambulatory Visit: Payer: Self-pay

## 2020-08-23 VITALS — BP 143/76 | HR 80 | Temp 97.9°F | Ht 69.0 in | Wt 197.8 lb

## 2020-08-23 DIAGNOSIS — I10 Essential (primary) hypertension: Secondary | ICD-10-CM | POA: Diagnosis not present

## 2020-08-23 DIAGNOSIS — E782 Mixed hyperlipidemia: Secondary | ICD-10-CM | POA: Diagnosis not present

## 2020-08-23 DIAGNOSIS — G40219 Localization-related (focal) (partial) symptomatic epilepsy and epileptic syndromes with complex partial seizures, intractable, without status epilepticus: Secondary | ICD-10-CM | POA: Diagnosis not present

## 2020-08-23 LAB — CMP14+EGFR
ALT: 29 IU/L (ref 0–44)
AST: 36 IU/L (ref 0–40)
Albumin/Globulin Ratio: 2.6 — ABNORMAL HIGH (ref 1.2–2.2)
Albumin: 4.9 g/dL — ABNORMAL HIGH (ref 3.7–4.7)
Alkaline Phosphatase: 83 IU/L (ref 44–121)
BUN/Creatinine Ratio: 14 (ref 10–24)
BUN: 16 mg/dL (ref 8–27)
Bilirubin Total: 1.1 mg/dL (ref 0.0–1.2)
CO2: 25 mmol/L (ref 20–29)
Calcium: 9.8 mg/dL (ref 8.6–10.2)
Chloride: 100 mmol/L (ref 96–106)
Creatinine, Ser: 1.15 mg/dL (ref 0.76–1.27)
Globulin, Total: 1.9 g/dL (ref 1.5–4.5)
Glucose: 106 mg/dL — ABNORMAL HIGH (ref 65–99)
Potassium: 3.8 mmol/L (ref 3.5–5.2)
Sodium: 138 mmol/L (ref 134–144)
Total Protein: 6.8 g/dL (ref 6.0–8.5)
eGFR: 67 mL/min/{1.73_m2} (ref >59)

## 2020-08-23 LAB — CBC
Hematocrit: 42.5 % (ref 37.5–51.0)
Hemoglobin: 14.5 g/dL (ref 13.0–17.7)
MCH: 32.6 pg (ref 26.6–33.0)
MCHC: 34.1 g/dL (ref 31.5–35.7)
MCV: 96 fL (ref 79–97)
Platelets: 189 10*3/uL (ref 150–450)
RBC: 4.45 x10E6/uL (ref 4.14–5.80)
RDW: 12.8 % (ref 11.6–15.4)
WBC: 5.3 10*3/uL (ref 3.4–10.8)

## 2020-08-23 LAB — LIPID PANEL
Chol/HDL Ratio: 4.4 ratio (ref 0.0–5.0)
Cholesterol, Total: 153 mg/dL (ref 100–199)
HDL: 35 mg/dL — ABNORMAL LOW (ref >39)
LDL Chol Calc (NIH): 97 mg/dL (ref 0–99)
Triglycerides: 112 mg/dL (ref 0–149)
VLDL Cholesterol Cal: 21 mg/dL (ref 5–40)

## 2020-08-23 MED ORDER — TAMSULOSIN HCL 0.4 MG PO CAPS: 0.4000 mg | ORAL_CAPSULE | Freq: Every day | ORAL | 3 refills | Status: DC

## 2020-08-23 NOTE — Progress Notes (Signed)
Subjective: CC: Hypertension hyperlipidemia PCP: Janora Norlander, DO Eric Cook is a 75 y.o. male presenting to clinic today for:  1.  Hypertension with hyperlipidemia Patient is compliant with Zestoretic 10-12.5 mg daily, Zocor 20 mg daily.  No chest pain, shortness of breath.  2.  Seizure disorder Patient was seen by neurology yesterday.  He had his medication adjusted to reduce pill burden to Keppra extended release 500 mg daily.  Consideration for taper off the medication was discussed but this would require him to be 6 months with no driving and he would have rather remain independent so medication has been continued.  They are requesting CBC and CMP to be obtained at today's visit.  He has been seizure-free.  He still has not regained a good sense of taste since he was acutely ill and what he intakes is variable.  ROS: Per HPI  No Known Allergies Past Medical History:  Diagnosis Date   Anxiety    Hyperlipidemia    Hypertension    Seizures (Eric Cook)    medication related    Current Outpatient Medications:    escitalopram (LEXAPRO) 10 MG tablet, Take 1.5 tablets (15 mg total) by mouth daily., Disp: 135 tablet, Rfl: 3   esomeprazole (NEXIUM) 40 MG capsule, Take 1 capsule (40 mg total) by mouth daily., Disp: 90 capsule, Rfl: 3   levETIRAcetam (KEPPRA XR) 500 MG 24 hr tablet, Take 1 tablet (500 mg total) by mouth at bedtime., Disp: 30 tablet, Rfl: 5   lisinopril-hydrochlorothiazide (ZESTORETIC) 10-12.5 MG tablet, Take 1 tablet by mouth daily., Disp: 90 tablet, Rfl: 3   simvastatin (ZOCOR) 20 MG tablet, Take 1 tablet (20 mg total) by mouth daily at 6 PM., Disp: 90 tablet, Rfl: 3   tamsulosin (FLOMAX) 0.4 MG CAPS capsule, TAKE 1 CAPSULE EVERY DAY FOR URINARY SYMPTOMS, Disp: 90 capsule, Rfl: 0 Social History   Socioeconomic History   Marital status: Single    Spouse name: Not on file   Number of children: 1   Years of education: Not on file   Highest education level:  Not on file  Occupational History   Occupation: Film/video editor    Comment: part time  Tobacco Use   Smoking status: Former    Packs/day: 0.50    Years: 30.00    Pack years: 15.00    Types: Cigarettes    Quit date: 04/2019    Years since quitting: 1.3   Smokeless tobacco: Never  Vaping Use   Vaping Use: Never used  Substance and Sexual Activity   Alcohol use: Never   Drug use: Never   Sexual activity: Never  Other Topics Concern   Not on file  Social History Narrative   Lives alone   Right Handed   Drinks 2-3 cups of caffeine daily   Son in [place] Resource Strain: Low Risk    Difficulty of Paying Living Expenses: Not hard at all  Food Insecurity: No Food Insecurity   Worried About Charity fundraiser in the Last Year: Never true   Ran Out of Food in the Last Year: Never true  Transportation Needs: No Transportation Needs   Lack of Transportation (Medical): No   Lack of Transportation (Non-Medical): No  Physical Activity: Not on file  Stress: No Stress Concern Present   Feeling of Stress : Only a little  Social Connections: Socially Isolated   Frequency of Communication with Friends and Family:  More than three times a week   Frequency of Social Gatherings with Friends and Family: Twice a week   Attends Religious Services: Never   Marine scientist or Organizations: No   Attends Music therapist: Never   Marital Status: Divorced  Human resources officer Violence: Not At Risk   Fear of Current or Ex-Partner: No   Emotionally Abused: No   Physically Abused: No   Sexually Abused: No   No family history on file.  Objective: Office vital signs reviewed. BP (!) 143/76   Pulse 80   Temp 97.9 F (36.6 C)   Ht _0  (1.753 m)   Wt 197 lb 12.8 oz (89.7 kg)   SpO2 94%   BMI 29.21 kg/m   Physical Examination:  General: Awake, alert, No acute distress HEENT: Normal; sclera white.  PERRLA.   EOMI Cardio: regular rate and rhythm, S1S2 heard, no murmurs appreciated Pulm: Initially coarse breath sounds but these cleared after coughing.  No wheezes, rhonchi or rales.  Normal work of breathing on room air MSK: Ambulating independently Skin: dry; intact; no rashes or lesions Neuro: No tremor.  Alert and oriented  Assessment/ Plan: 75 y.o. male   Essential hypertension - Plan: CMP14+EGFR  Mixed hyperlipidemia - Plan: CMP14+EGFR, Lipid Panel  Partial symptomatic epilepsy with complex partial seizures, intractable, without status epilepticus ([name]) - Plan: CMP14+EGFR, CBC  Blood pressure is at goal for age.  Can collect fasting labs.  Continue lisinopril-hydrochlorothiazide, Zocor.  Will check CMP and CBC for his neurologist.  It sounds like he is doing fairly well on current medications.  No orders of the defined types were placed in this encounter.  No orders of the defined types were placed in this encounter.    Janora Norlander, DO [place] [phone]

## 2020-08-25 NOTE — Progress Notes (Signed)
I agree with the above plan 

## 2020-10-05 DIAGNOSIS — R070 Pain in throat: Secondary | ICD-10-CM | POA: Diagnosis not present

## 2020-10-05 DIAGNOSIS — K219 Gastro-esophageal reflux disease without esophagitis: Secondary | ICD-10-CM | POA: Diagnosis not present

## 2020-10-05 DIAGNOSIS — R1312 Dysphagia, oropharyngeal phase: Secondary | ICD-10-CM | POA: Diagnosis not present

## 2020-10-13 ENCOUNTER — Ambulatory Visit (INDEPENDENT_AMBULATORY_CARE_PROVIDER_SITE_OTHER): Payer: Medicare HMO | Admitting: Family

## 2020-10-13 ENCOUNTER — Encounter: Payer: Self-pay | Admitting: Family

## 2020-10-13 VITALS — BP 160/82 | HR 70 | Temp 97.8°F | Ht 69.0 in | Wt 196.0 lb

## 2020-10-13 DIAGNOSIS — J208 Acute bronchitis due to other specified organisms: Secondary | ICD-10-CM

## 2020-10-13 DIAGNOSIS — I10 Essential (primary) hypertension: Secondary | ICD-10-CM

## 2020-10-13 DIAGNOSIS — B9689 Other specified bacterial agents as the cause of diseases classified elsewhere: Secondary | ICD-10-CM | POA: Diagnosis not present

## 2020-10-13 DIAGNOSIS — R059 Cough, unspecified: Secondary | ICD-10-CM | POA: Diagnosis not present

## 2020-10-13 LAB — VERITOR FLU A/B WAIVED
Influenza A: NEGATIVE
Influenza B: NEGATIVE

## 2020-10-13 MED ORDER — PREDNISONE 10 MG (21) PO TBPK: ORAL_TABLET | ORAL | 0 refills | Status: DC

## 2020-10-13 MED ORDER — DOXYCYCLINE HYCLATE 100 MG PO TABS: 100.0000 mg | ORAL_TABLET | Freq: Two times a day (BID) | ORAL | 0 refills | Status: DC

## 2020-10-13 NOTE — Progress Notes (Signed)
Subjective:    Patient ID: Eric Cook, male    DOB: 05-30-1945, 75 y.o.   MRN: [id number]  Chief Complaint  Patient presents with   Headache   Cough    Started Sunday. Chest sore from cough     Headache  Associated symptoms include coughing. Pertinent negatives include no ear pain or fever. His past medical history is significant for hypertension.  Cough This is a new problem. The current episode started in the past 7 days. The problem has been unchanged. The problem occurs every few minutes. The cough is Non-productive. Associated symptoms include headaches, myalgias, nasal congestion, postnasal drip, shortness of breath and wheezing. Pertinent negatives include no chills, ear congestion, ear pain or fever. He has tried rest and OTC cough suppressant for the symptoms. The treatment provided mild relief.  Hypertension This is a chronic problem. The current episode started more than 1 year ago. The problem has been waxing and waning since onset. The problem is uncontrolled. Associated symptoms include headaches and shortness of breath.     Review of Systems  Constitutional:  Negative for chills and fever.  HENT:  Positive for postnasal drip. Negative for ear pain.   Respiratory:  Positive for cough, shortness of breath and wheezing.   Musculoskeletal:  Positive for myalgias.  Neurological:  Positive for headaches.  All other systems reviewed and are negative.     Objective:   Physical Exam Vitals reviewed.  Constitutional:      General: He is not in acute distress.    Appearance: He is well-developed.  HENT:     Head: Normocephalic.     Right Ear: External ear normal.     Left Ear: External ear normal.  Eyes:     General:        Right eye: No discharge.        Left eye: No discharge.     Pupils: Pupils are equal, round, and reactive to light.  Neck:     Thyroid: No thyromegaly.  Cardiovascular:     Rate and Rhythm: Normal rate and regular rhythm.     Heart  sounds: Normal heart sounds. No murmur heard. Pulmonary:     Effort: Pulmonary effort is normal. No respiratory distress.     Breath sounds: Wheezing and rhonchi present.  Abdominal:     General: Bowel sounds are normal. There is no distension.     Palpations: Abdomen is soft.     Tenderness: There is no abdominal tenderness.  Musculoskeletal:        General: No tenderness. Normal range of motion.     Cervical back: Normal range of motion and neck supple.  Skin:    General: Skin is warm and dry.     Coloration: Skin is pale.     Findings: No erythema or rash.  Neurological:     Mental Status: He is alert and oriented to person, place, and time.     Cranial Nerves: No cranial nerve deficit.     Deep Tendon Reflexes: Reflexes are normal and symmetric.  Psychiatric:        Behavior: Behavior normal.        Thought Content: Thought content normal.        Judgment: Judgment normal.     BP (!) 160/82   Pulse 70   Temp 97.8 F (36.6 C) (Temporal)   Ht 5\' 9"  (1.753 m)   Wt 196 lb (88.9 kg)   SpO2 95%  BMI 28.94 kg/m       Assessment & Plan:   BYRD RUSHLOW comes in today with chief complaint of Headache and Cough (Started Sunday. Chest sore from cough )   Diagnosis and orders addressed:  1. Cough - Novel Coronavirus, NAA ([facility]) - Veritor Flu A/B Waived - predniSONE (STERAPRED UNI-PAK 21 TAB) 10 MG (21) TBPK tablet; Use as directed  Dispense: 21 tablet; Refill: 0 - doxycycline (VIBRA-TABS) 100 MG tablet; Take 1 tablet (100 mg total) by mouth 2 (two) times daily.  Dispense: 20 tablet; Refill: 0  2. Acute bacterial bronchitis - Take meds as prescribed - Use a cool mist humidifier  -Use saline nose sprays frequently -Force fluids -For any cough or congestion  Use plain Mucinex- regular strength or max strength is fine -For fever or aces or pains- take tylenol or ibuprofen. -Throat lozenges if help -RTO if symptoms worsen or do not improve  - predniSONE  (STERAPRED UNI-PAK 21 TAB) 10 MG (21) TBPK tablet; Use as directed  Dispense: 21 tablet; Refill: 0 - doxycycline (VIBRA-TABS) 100 MG tablet; Take 1 tablet (100 mg total) by mouth 2 (two) times daily.  Dispense: 20 tablet; Refill: 0  3. Primary hypertension   Given chronic conditions will treat.   Jannifer Rodney, FNP

## 2020-10-13 NOTE — Patient Instructions (Signed)
Community-Acquired Pneumonia, Adult ?Pneumonia is a lung infection that causes inflammation and the buildup of mucus and fluids in the lungs. This may cause coughing and difficulty breathing. Community-acquired pneumonia is pneumonia that develops in people who are not, and have not recently been, in a hospital or other health care facility. ?Usually, pneumonia develops as a result of an illness that is caused by a virus, such as the common cold and the flu (influenza). It can also be caused by bacteria or fungi. While the common cold and influenza can pass from person to person (are contagious), pneumonia itself is not considered contagious. ?What are the causes? ?This condition may be caused by: ?Viruses. ?Bacteria. ?Fungi, such as molds or mushrooms. ?What increases the risk? ?The following factors may make you more likely to develop this condition: ?Having certain medical conditions, such as: ?A long-term (chronic) disease, which may include chronic obstructive pulmonary disease (COPD), asthma, heart failure, cystic fibrosis, diabetes, kidney disease, sickle cell disease, and human immunodeficiency virus (HIV). ?A condition that increases the risk of breathing in (aspirating) mucus and other fluids from your mouth and nose. ?A weakened body defense system (immune system). ?Having had your spleen removed (splenectomy). The spleen is the organ that helps fight germs and infections. ?Not cleaning your teeth and gums well (poor dental hygiene). ?Using tobacco products. ?Traveling to places where germs that cause pneumonia are present. ?Being near certain animals, or animal habitats, that have germs that cause pneumonia. ?Being older than 74 years of age. ?What are the signs or symptoms? ?Symptoms of this condition include: ?A dry cough or a wet (productive) cough. ?A fever. ?Sweating or chills. ?Chest pain, especially when breathing deeply or coughing. ?Fast breathing, difficulty breathing, or shortness of  breath. ?Tiredness (fatigue). ?Muscle aches. ?How is this diagnosed? ?This condition may be diagnosed based on your medical history or a physical exam. You may also have tests, including: ?Chest X-rays. ?Tests of the level of oxygen and other gases in your blood. ?Tests of: ?Your blood. ?Mucus from your lungs (sputum). ?Fluid around your lungs (pleural fluid). ?Your urine. ?If your pneumonia is severe, other tests may be done to learn more about the cause. ?How is this treated? ?Treatment for this condition depends on many factors, such as the cause of your pneumonia, your medicines, and other medical conditions that you have. ?For most adults, pneumonia may be treated at home. In some cases, treatment must happen in a hospital and may include: ?Medicines that are given by mouth (orally) or through an IV, including: ?Antibiotic medicines, if bacteria caused the pneumonia. ?Medicines that kill viruses (antiviral medicines), if a virus caused the pneumonia. ?Oxygen therapy. ?Severe pneumonia, although rare, may require the following treatments: ?Mechanical ventilation.This procedure uses a machine to help you breathe if you cannot breathe well on your own or maintain a safe level of blood oxygen. ?Thoracentesis. This procedure removes any buildup of pleural fluid to help with breathing. ?Follow these instructions at home: ?Medicines ?Take over-the-counter and prescription medicines only as told by your health care provider. ?Take cough medicine only if you have trouble sleeping. Cough medicine can prevent your body from removing mucus from your lungs. ?If you were prescribed an antibiotic medicine, take it as told by your health care provider. Do not stop taking the antibiotic even if you start to feel better. ?Lifestyle ?  ?Do not drink alcohol. ?Do not use any products that contain nicotine or tobacco, such as cigarettes, e-cigarettes, and chewing tobacco. If you   need help quitting, ask your health care  provider. ?Eat a healthy diet. This includes plenty of vegetables, fruits, whole grains, low-fat dairy products, and lean protein. ?General instructions ?Rest a lot and get at least 8 hours of sleep each night. ?Sleep in a partly upright position at night. Place a few pillows under your head or sleep in a reclining chair. ?Return to your normal activities as told by your health care provider. Ask your health care provider what activities are safe for you. ?Drink enough fluid to keep your urine pale yellow. This helps to thin the mucus in your lungs. ?If your throat is sore, gargle with a salt-water mixture 3-4 times a day or as needed. To make a salt-water mixture, completely dissolve ?-1 tsp (3-6 g) of salt in 1 cup (237 mL) of warm water. ?Keep all follow-up visits as told by your health care provider. This is important. ?How is this prevented? ?You can lower your risk of developing community-acquired pneumonia by: ?Getting the pneumonia vaccine. There are different types and schedules of pneumonia vaccines. Ask your health care provider which option is best for you. Consider getting the pneumonia vaccine if: ?You are older than 74 years of age. ?You are 19-65 years of age and are receiving cancer treatment, have chronic lung disease, or have other medical conditions that affect your immune system. Ask your health care provider if this applies to you. ?Getting your influenza vaccine every year. Ask your health care provider which type of vaccine is best for you. ?Getting regular dental checkups. ?Washing your hands often with soap and water for at least 20 seconds. If soap and water are not available, use hand sanitizer. ?Contact a health care provider if you have: ?A fever. ?Trouble sleeping because you cannot control your cough with cough medicine. ?Get help right away if: ?Your shortness of breath becomes worse. ?Your chest pain increases. ?Your sickness becomes worse, especially if you are an older adult or  have a weak immune system. ?You cough up blood. ?These symptoms may represent a serious problem that is an emergency. Do not wait to see if the symptoms will go away. Get medical help right away. Call your local emergency services (911 in the [place] Do not drive yourself to the hospital. ?Summary ?Pneumonia is an infection of the lungs. ?Community-acquired pneumonia develops in people who have not been in the hospital. It can be caused by bacteria, viruses, or fungi. ?This condition may be treated with antibiotics or antiviral medicines. ?Severe pneumonia may require a hospital stay and treatment to help with breathing. ?This information is not intended to replace advice given to you by your health care provider. Make sure you discuss any questions you have with your health care provider. ?Document Revised: 10/21/2018 Document Reviewed: 10/21/2018 ?Elsevier Patient Education ? 2022 Elsevier Inc. ? ?

## 2020-10-14 LAB — NOVEL CORONAVIRUS, NAA: SARS-CoV-2, NAA: NOT DETECTED

## 2020-10-14 LAB — SARS-COV-2, NAA 2 DAY TAT

## 2020-11-03 ENCOUNTER — Other Ambulatory Visit: Payer: Self-pay

## 2020-11-03 ENCOUNTER — Ambulatory Visit (INDEPENDENT_AMBULATORY_CARE_PROVIDER_SITE_OTHER): Payer: Medicare HMO

## 2020-11-03 DIAGNOSIS — Z23 Encounter for immunization: Secondary | ICD-10-CM

## 2020-11-03 NOTE — Addendum Note (Signed)
Addended by: Cleda Daub on: 11/03/2020 04:29 PM   Modules accepted: Orders

## 2020-11-21 ENCOUNTER — Other Ambulatory Visit: Payer: Self-pay | Admitting: Family Medicine

## 2021-01-03 ENCOUNTER — Other Ambulatory Visit: Payer: Self-pay | Admitting: Family Medicine

## 2021-01-03 DIAGNOSIS — I10 Essential (primary) hypertension: Secondary | ICD-10-CM

## 2021-01-03 DIAGNOSIS — E782 Mixed hyperlipidemia: Secondary | ICD-10-CM

## 2021-01-08 IMAGING — MR MR LUMBAR SPINE W/O CM
5 series · 33 of 48 positions shown · non-contrast
Comparison: Correlation made with same day radiograph.

CLINICAL DATA: Low back pain, L3 endplate depression on radiograph

EXAM:
MRI LUMBAR SPINE WITHOUT CONTRAST
TECHNIQUE: Multiplanar, multisequence MR imaging of the lumbar spine was
performed. No intravenous contrast was administered.

[Series 5: T2 · sagittal · 4.0mm · 0.81mm/px · 6 of 16 slices shown (1 of 2)]
[im 1/16]
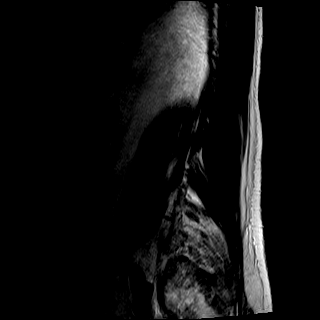
[im 4/16]
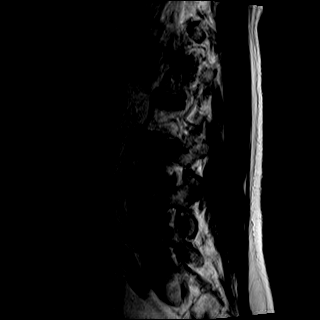
[im 7/16]
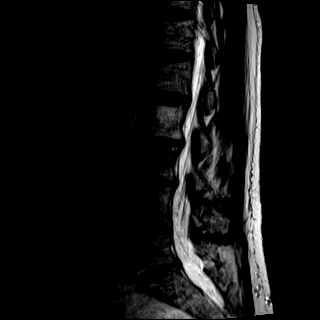
[im 10/16]
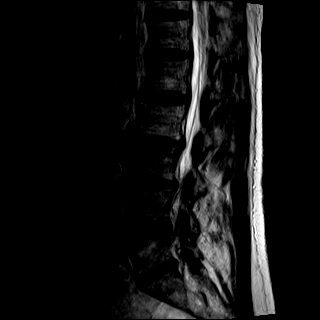
[im 13/16]
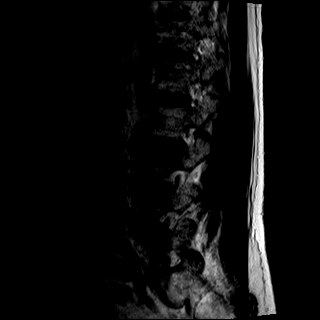
[im 16/16]
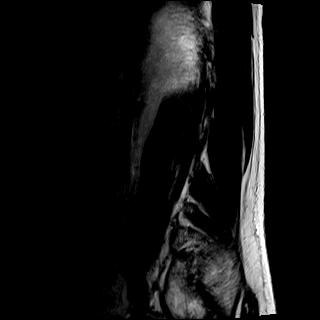

[Series 6: STIR · sagittal · 4.0mm · 0.51mm/px · 3 of 16 slices shown]
[im 1/16]
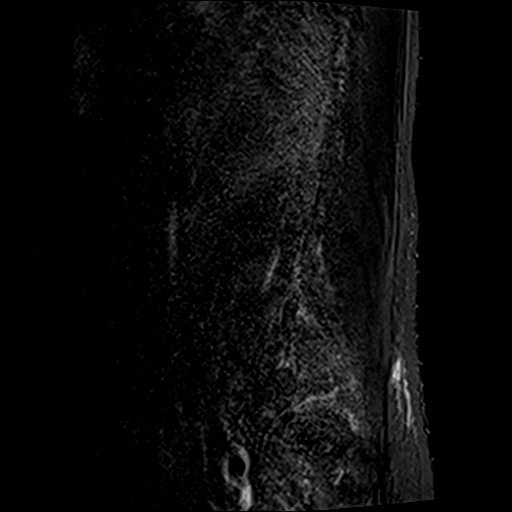
[im 4/16]
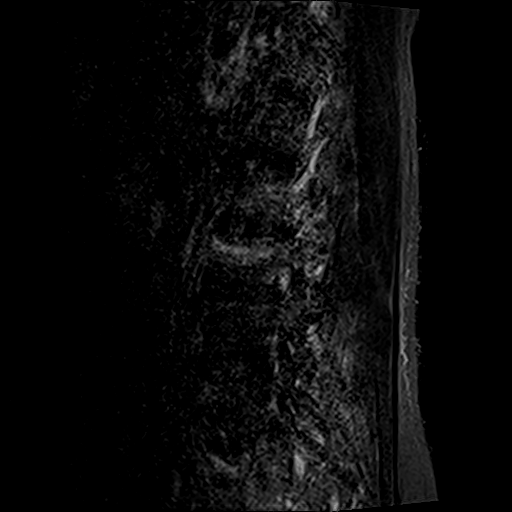
[im 7/16]
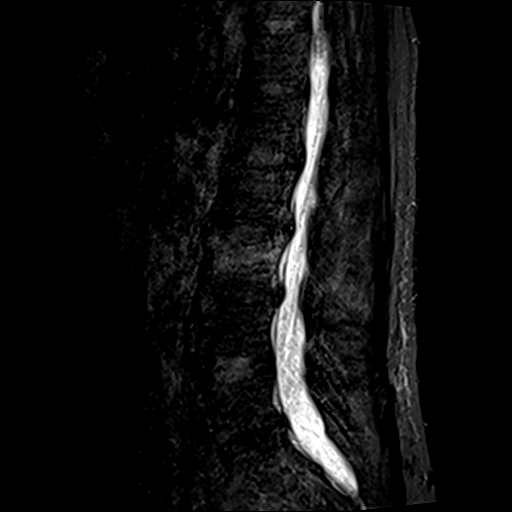

[Series 7: T1 · sagittal · 4.0mm · 1.02mm/px · 6 of 16 slices shown (1 of 2)]
[im 1/16]
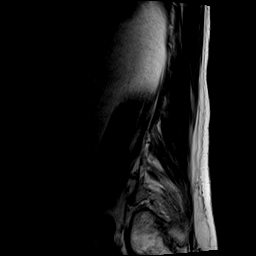
[im 4/16]
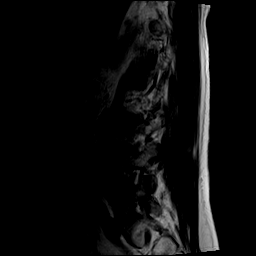
[im 7/16]
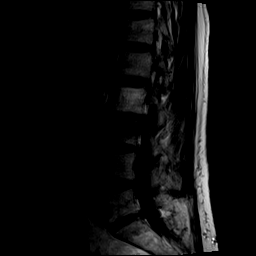
[im 10/16]
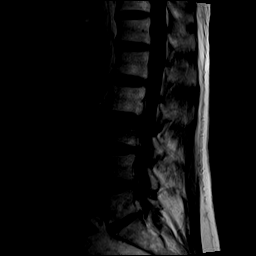
[im 13/16]
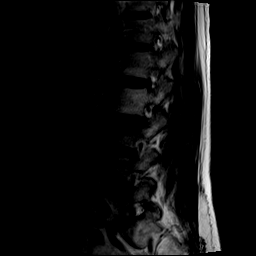
[im 16/16]
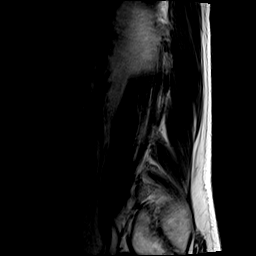

[Series 9: T2 · axial · 4.0mm · 0.70mm/px · z∈[-152,+72]mm · 9 of 38 slices shown (2 of 2)]
[im 1/38]
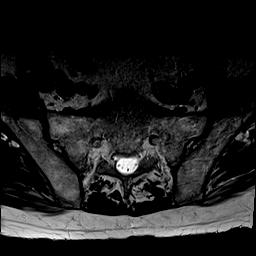
[im 6/38]
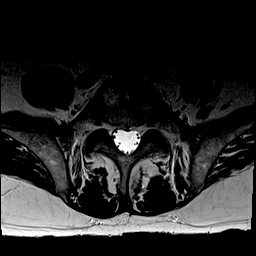
[im 11/38]
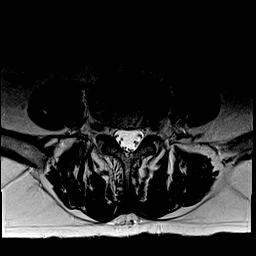
[im 16/38]
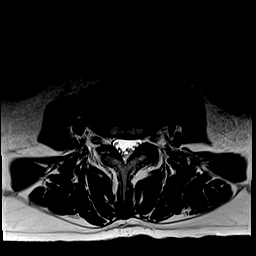
[im 19/38]
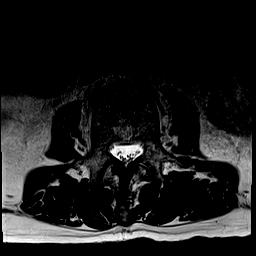
[im 22/38]
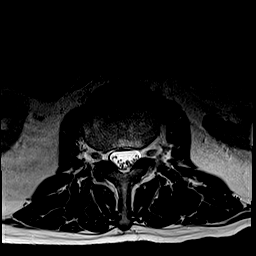
[im 27/38]
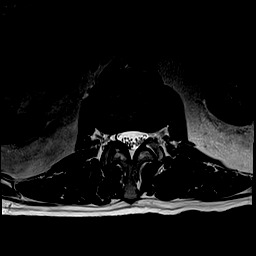
[im 32/38]
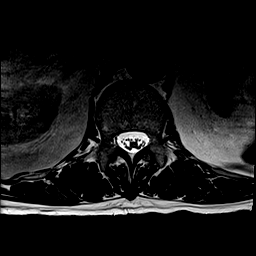
[im 38/38]
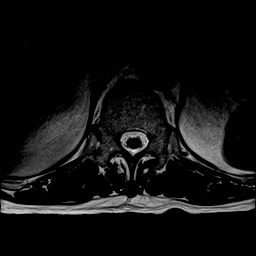

[Series 10: T1 · axial · 4.0mm · 0.35mm/px · z∈[-152,+72]mm · 9 of 38 slices shown (2 of 2)]
[im 1/38]
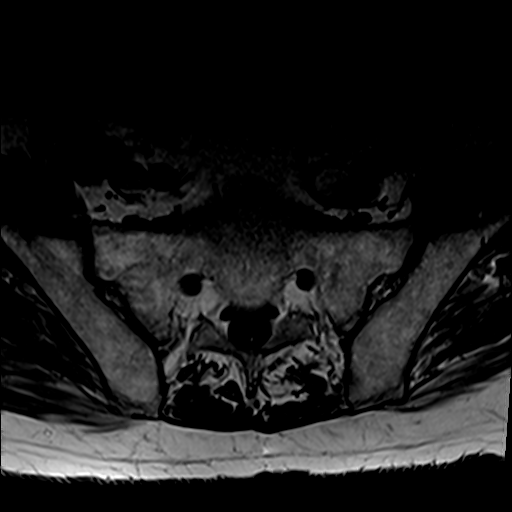
[im 6/38]
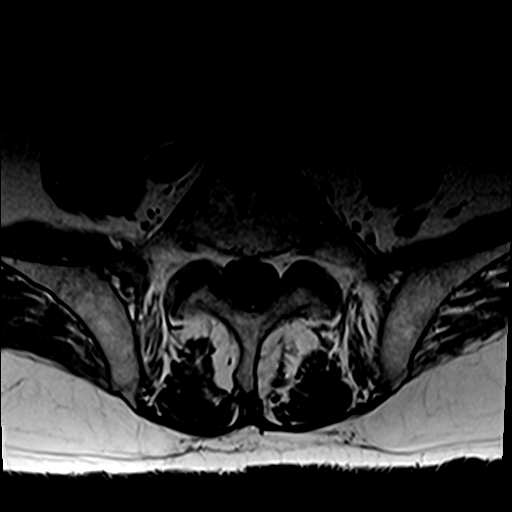
[im 11/38]
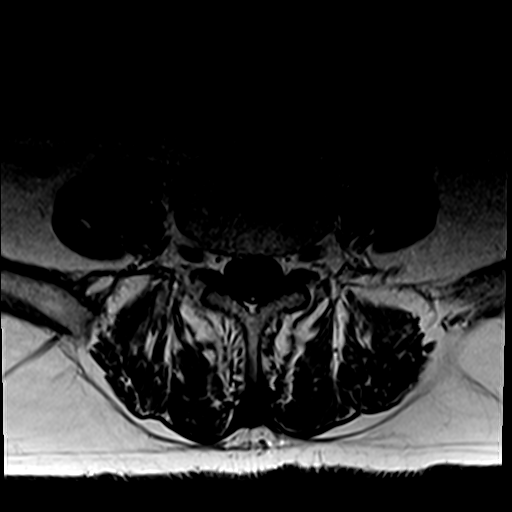
[im 16/38]
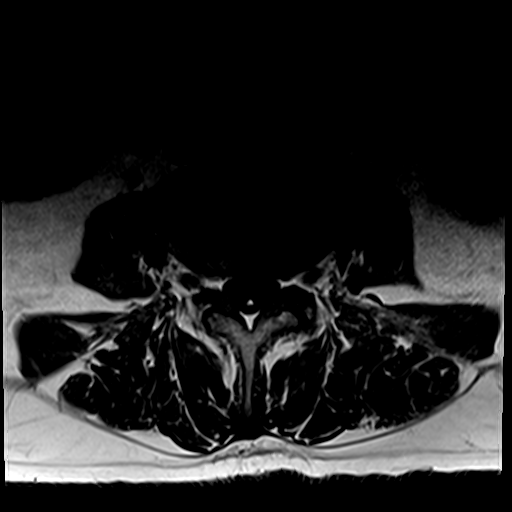
[im 19/38]
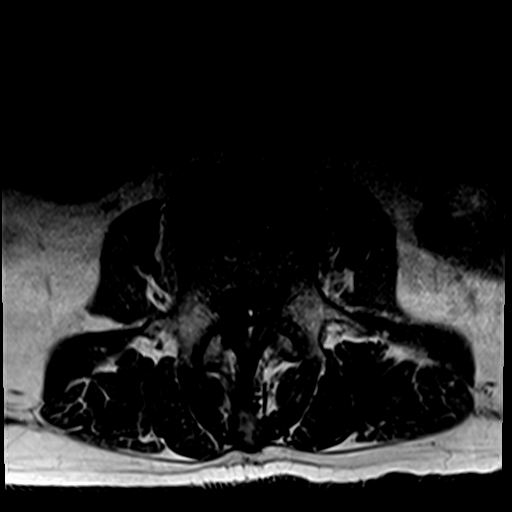
[im 22/38]
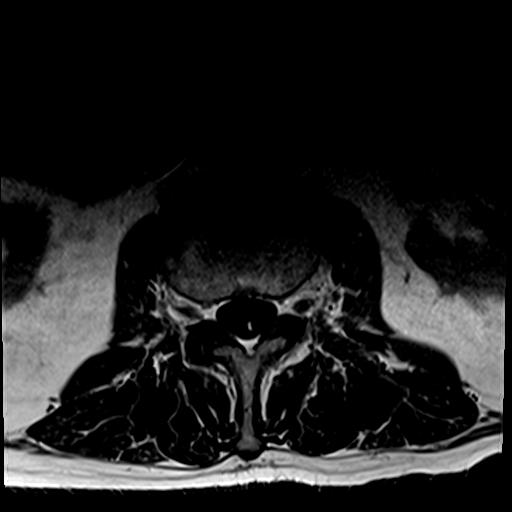
[im 27/38]
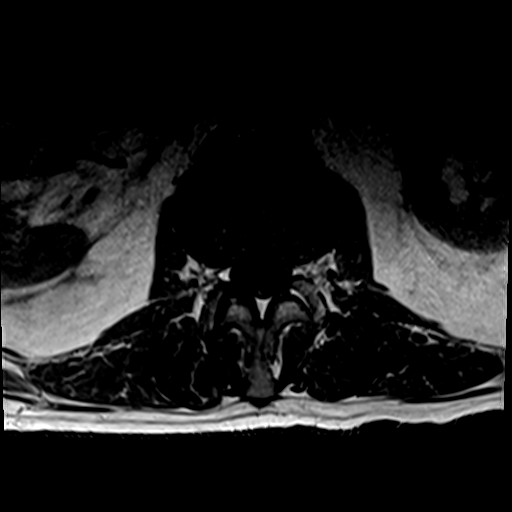
[im 32/38]
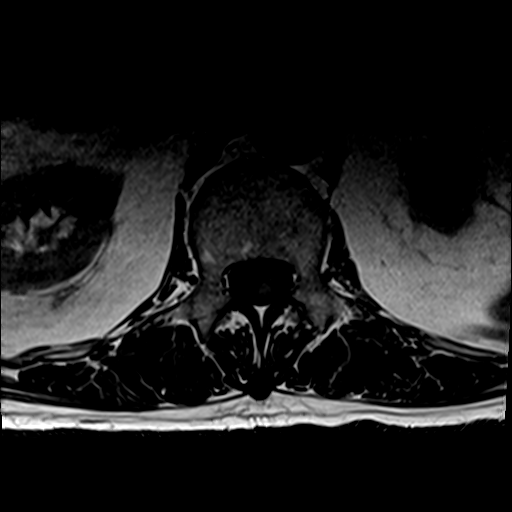
[im 38/38]
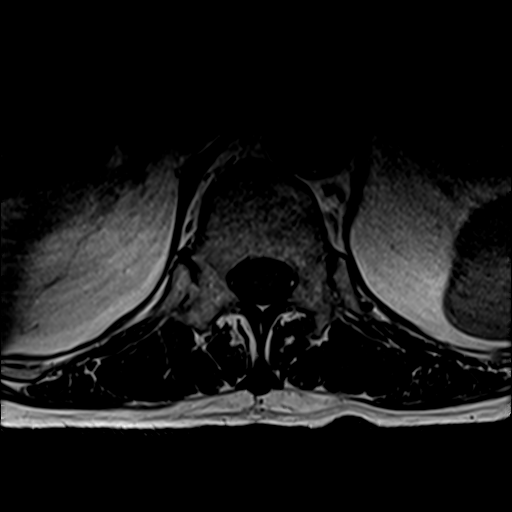

[33 of 48 positions shown; findings below may reference images not displayed]

FINDINGS: Motion artifact is present.

Segmentation: Standard.

Alignment:  Anteroposterior alignment is maintained.

Vertebrae: There is compression deformity of L3 with less than 50%
loss of height at the superior endplate. Underlying marrow edema is
present. No substantial endplate retropulsion. Vertebral body
heights and signal are otherwise unremarkable.

Conus medullaris and cauda equina: Conus extends to the L1 level.
Conus and cauda equina appear normal. There is a fatty filum
terminale.

Paraspinal and other soft tissues: Unremarkable.

Disc levels:

L1-L2:  No significant canal or foraminal stenosis.

L2-L3:  Disc bulge.  No significant canal or foraminal stenosis.

L3-L4: Disc bulge. No significant canal stenosis. Slight effacement
of the lateral recesses. No significant right foraminal stenosis.
Disc abuts the exiting left L3 nerve root without evidence of
compression.

L4-L5:  No significant canal or foraminal stenosis.

L5-S1: Evidence of prior left laminectomy. Canal is decompressed. No
significant foraminal stenosis.
IMPRESSION: Motion degraded. Acute or subacute compression fracture of L3 with
less than 50% loss of vertebral body height and no substantial
retropulsion.

## 2021-01-23 ENCOUNTER — Other Ambulatory Visit: Payer: Self-pay | Admitting: Adult Health

## 2021-02-22 ENCOUNTER — Ambulatory Visit: Payer: Medicare HMO | Admitting: Adult Health

## 2021-02-22 ENCOUNTER — Encounter: Payer: Self-pay | Admitting: Adult Health

## 2021-02-22 VITALS — BP 120/68 | HR 71 | Ht 69.0 in | Wt 204.0 lb

## 2021-02-22 DIAGNOSIS — G40219 Localization-related (focal) (partial) symptomatic epilepsy and epileptic syndromes with complex partial seizures, intractable, without status epilepticus: Secondary | ICD-10-CM | POA: Diagnosis not present

## 2021-02-22 NOTE — Patient Instructions (Addendum)
Your Plan:  Continue Keppra XR 500mg  daily for seizure prevention     Follow-up in 1 year or call earlier if needed unremarkable     Thank you for coming to see at [facility]. I hope we have been able to provide you high quality care today.  You may receive a patient satisfaction survey over the next few weeks. We would appreciate your feedback and comments so that we may continue to improve ourselves and the health of our patients.

## 2021-02-22 NOTE — Progress Notes (Signed)
[facility] [address] [place]. [place] [address] [phone]       OFFICE FOLLOW UP NOTE  [name] Date of Birth:  [birth year] Medical Record Number:  [id number]   Referring MD: Doylene Canard, DO  Reason for Referral: Seizure   Chief Complaint  Patient presents with   Follow-up    Rm 3 alone Pt is well and stable, no sz since last visit       HPI:   Update 02/22/2021 JM: returns for 6 month seizure follow up unaccompanied.  Overall doing well.  Has not had any seizure activity.  Compliant on Keppra XR 500 mg daily without side effects.  Remains independent with ADLs and IADLs as well as driving.  CBC and CMP completed 08/2020 unremarkable.  No new concerns at this time.     History provided for reference purposes only Update 08/22/2020 JM: [name] returns for 30-month seizure follow-up accompanied by his son.  He has been doing well since prior visit without any seizure activity and Keppra 250 mg twice daily tolerating without side effects.  He does question possibility of stopping Keppra.  Repeat EEG 02/2020 normal without evidence of seizure activity.  He has since returned back to driving without any difficulty.  Maintains ADLs and IADLs independently.  He routinely follows with his PCP Dr. Nadine Counts every 3 to 4 months - has appt tomorrow and believes routine lab work will be completed at that time.  No further concerns at this time.  Update 02/22/2020 JM: [name] returns for seizure follow-up accompanied by his son.  He has remained on Keppra 250 mg twice daily tolerating well without side effects and has not had any additional seizure type activity.  Recommended repeat EEG not yet completed -patient and son thought today's visit was to have this completed.  He questions return back to driving as it has been 6 months since any seizure activity.  No concerns at this time.  Initial consult visit 11/10/2019 Dr. Pearlean Brownie: [name] is a  76 year old Caucasian male with past medical history of hypertension, hyperlipidemia and anxiety who is seen today for initial office consultation visit for new onset seizures.  He is accompanied by his son [name] today.  History is obtained from them, review of electronic medical records and I personally reviewed available imaging films in PACS.  Patient had an episode on 08/24/2019 of sudden onset of speech difficulties.  He blames this on having stopped Xanax suddenly which she had been taking long-term 2 days prior.  He had also been unable to sleep the night before.  His son noticed that his speech did not make sense.  The patient called his son over the phone and kept on repeating saying I want to kill [name].  And the son came to his house and noticed him to be confused he was also haddock involuntary rubbing movement of his hands automatically.  He appeared disoriented and had word finding difficulties and trouble completing sentences.  These symptoms lasted about 24 hours and gradually improve the next day.  He was admitted to [facility] where MRI scan of the brain showed no acute abnormality but showed mild generalized atrophy and old left basal ganglia lacune.  EEG on 08/24/2019 showed focal left frontotemporal slowing in the 3 to 6 Hz range.  CBC, BMP and urine drug screen were negative except for benzos.  Patient was seen by Dr. Gerilyn Pilgrim neurologist who had strong suspicion for complex partial  seizure and started up with patient on Keppra.  Patient has been taking Keppra 250 mg twice daily which seems to be tolerating well and has had no further episodes like this since he left the hospital.  He is been having some pain in his pelvis related to his prostate and has an upcoming appointment to see a urologist for that.  He denies any prior history of seizures, significant head injury with loss of consciousness, strokes or TIA.  There is no family history of epilepsy.  He denies drinking alcohol, using  marijuana or drugs of abuse.  ROS:   14 system review of systems is positive for those listed in HPI and all other systems negative  PMH:  Past Medical History:  Diagnosis Date   Anxiety    Hyperlipidemia    Hypertension    Seizures (HCC)    medication related    Social History:  Social History   Socioeconomic History   Marital status: Single    Spouse name: Not on file   Number of children: 1   Years of education: Not on file   Highest education level: Not on file  Occupational History   Occupation: Lobbyistsmall equipment repair    Comment: part time  Tobacco Use   Smoking status: Former    Packs/day: 0.50    Years: 30.00    Pack years: 15.00    Types: Cigarettes    Quit date: 04/2019    Years since quitting: 1.8   Smokeless tobacco: Never  Vaping Use   Vaping Use: Never used  Substance and Sexual Activity   Alcohol use: Never   Drug use: Never   Sexual activity: Never  Other Topics Concern   Not on file  Social History Narrative   Lives alone   Right Handed   Drinks 2-3 cups of caffeine daily   Son in [place]   Social Determinants of Health   Financial Resource Strain: Low Risk    Difficulty of Paying Living Expenses: Not hard at all  Food Insecurity: No Food Insecurity   Worried About Programme researcher, broadcasting/film/videounning Out of Food in the Last Year: Never true   Ran Out of Food in the Last Year: Never true  Transportation Needs: No Transportation Needs   Lack of Transportation (Medical): No   Lack of Transportation (Non-Medical): No  Physical Activity: Not on file  Stress: No Stress Concern Present   Feeling of Stress : Only a little  Social Connections: Socially Isolated   Frequency of Communication with Friends and Family: More than three times a week   Frequency of Social Gatherings with Friends and Family: Twice a week   Attends Religious Services: Never   Database administratorActive Member of Clubs or Organizations: No   Attends Engineer, structuralClub or Organization Meetings: Never   Marital Status:  Divorced  Catering managerntimate Partner Violence: Not At Risk   Fear of Current or Ex-Partner: No   Emotionally Abused: No   Physically Abused: No   Sexually Abused: No    Medications:   Current Outpatient Medications on File Prior to Visit  Medication Sig Dispense Refill   escitalopram (LEXAPRO) 10 MG tablet TAKE 1 AND 1/2 TABLETS EVERY DAY 135 tablet 3   levETIRAcetam (KEPPRA XR) 500 MG 24 hr tablet TAKE 1 TABLET AT BEDTIME 90 tablet 2   lisinopril-hydrochlorothiazide (ZESTORETIC) 10-12.5 MG tablet TAKE 1 TABLET EVERY DAY 90 tablet 0   simvastatin (ZOCOR) 20 MG tablet TAKE 1 TABLET (20 MG TOTAL) BY MOUTH DAILY AT 6  PM. 90 tablet 0   tamsulosin (FLOMAX) 0.4 MG CAPS capsule Take 1 capsule (0.4 mg total) by mouth daily. 90 capsule 3   No current facility-administered medications on file prior to visit.    Allergies:  No Known Allergies  Physical Exam Today's Vitals   02/22/21 0733  BP: 120/68  Pulse: 71  Weight: 204 lb (92.5 kg)  Height: 5\' 9"  (1.753 m)   Body mass index is 30.13 kg/m.   General: well developed, well nourished, pleasant elderly Caucasian male, seated, in no evident distress Head: head normocephalic and atraumatic.   Neck: supple with no carotid or supraclavicular bruits Cardiovascular: regular rate and rhythm, no murmurs Musculoskeletal: no deformity Skin:  no rash/petichiae Vascular:  Normal pulses all extremities  Neurologic Exam Mental Status: Awake and fully alert.  Fluent speech and language.  Oriented to place and time. Recent and remote memory intact. Attention span, concentration and fund of knowledge appropriate. Mood and affect appropriate.    Cranial Nerves: Pupils equal, briskly reactive to light. Extraocular movements full without nystagmus. Visual fields full to confrontation. Hearing intact. Facial sensation intact. Face, tongue, palate moves normally and symmetrically.  Motor: Normal bulk and tone. Normal strength in all tested extremity  muscles. Sensory.: intact to touch , pinprick , position and vibratory sensation.  Coordination: Rapid alternating movements normal in all extremities. Finger-to-nose and heel-to-shin performed accurately bilaterally. Gait and Station: Arises from chair without difficulty. Stance is normal. Gait demonstrates normal stride length and balance . Able to heel, toe and tandem walk with slight difficulty.  Reflexes: 1+ and symmetric. Toes downgoing.        ASSESSMENT/PLAN: 76 year old Caucasian male with recurrent transient episodes of speech disturbance, automatisms and confusion disorientation likely complex partial seizures on 08/23/2019.  Unremarkable brain imaging but abnormal EEG showing focal left frontotemporal slowing.    1. Partial symptomatic epilepsy with complex partial seizures, not intractable, without status epilepticus (HCC) -Continue Keppra XR 500mg  nightly - refill up to date -Discussed weaning Keppra with possibility of discontinuing.  Discussed possibility of seizures if medication further reduced and/or discontinued as well as no driving while weaning dosage, and once completely stopped, no driving for 6 months per [place] law.  Due to these restrictions and as he wishes to continue to drive and remain independent, he would like to continue keppra -lab work 08/2020 with PCP unremarkable  -EEG 08/24/2019 cortical dysfunction in the left frontotemporal region possibly postictal state -EEG 03/07/2020 normal without evidence of seizures -MR brain 08/24/2019 small remote left basal ganglia infarct, moderate cerebral atrophy and mild chronic microvascular ischemic changes -advised to avoid seizure provoking triggers like abrupt medication discontinuation, medication noncompliance, alcohol, extremes of activity and exertion and abrupt changes in diet    Follow-up in 1 year or call earlier if needed   CC:  03/09/2020 M, DO   I spent 22 minutes of face-to-face and non-face-to-face  time with patient.  This included previsit chart review, lab review, study review, electronic health record documentation, patient education and discussion regarding history of seizures, ongoing use of AED and possible discontinuing and answered all other questions to patients satisfaction  Delynn Flavin, [facility]  [facility] [address] [place], [address] [place]  Phone [phone] Fax [phone] Note: This document was prepared with digital dictation and possible smart phrase technology. Any transcriptional errors that result from this process are unintentional.

## 2021-04-03 ENCOUNTER — Other Ambulatory Visit: Payer: Self-pay | Admitting: Family Medicine

## 2021-04-13 ENCOUNTER — Other Ambulatory Visit: Payer: Self-pay

## 2021-04-13 ENCOUNTER — Emergency Department (HOSPITAL_COMMUNITY)
Admission: EM | Admit: 2021-04-13 | Discharge: 2021-04-13 | Disposition: A | Discharge status: Home / Self Care | Payer: Medicare HMO | Attending: Emergency Medicine | Admitting: Emergency Medicine

## 2021-04-13 ENCOUNTER — Emergency Department (HOSPITAL_COMMUNITY): Payer: Medicare HMO

## 2021-04-13 ENCOUNTER — Encounter (HOSPITAL_COMMUNITY): Payer: Self-pay | Admitting: Pharmacy Technician

## 2021-04-13 DIAGNOSIS — S32040A Wedge compression fracture of fourth lumbar vertebra, initial encounter for closed fracture: Secondary | ICD-10-CM | POA: Diagnosis not present

## 2021-04-13 DIAGNOSIS — I10 Essential (primary) hypertension: Secondary | ICD-10-CM | POA: Insufficient documentation

## 2021-04-13 DIAGNOSIS — Z79899 Other long term (current) drug therapy: Secondary | ICD-10-CM | POA: Diagnosis not present

## 2021-04-13 DIAGNOSIS — M5431 Sciatica, right side: Secondary | ICD-10-CM | POA: Diagnosis not present

## 2021-04-13 DIAGNOSIS — M5441 Lumbago with sciatica, right side: Secondary | ICD-10-CM

## 2021-04-13 DIAGNOSIS — S32030A Wedge compression fracture of third lumbar vertebra, initial encounter for closed fracture: Secondary | ICD-10-CM | POA: Diagnosis not present

## 2021-04-13 DIAGNOSIS — M545 Low back pain, unspecified: Secondary | ICD-10-CM | POA: Diagnosis not present

## 2021-04-13 MED ORDER — PREDNISONE 20 MG PO TABS
ORAL_TABLET | ORAL | 0 refills | Status: DC
Start: 2021-04-14 — End: 2021-04-20

## 2021-04-13 MED ORDER — METHOCARBAMOL 750 MG PO TABS: 750.0000 mg | ORAL_TABLET | Freq: Three times a day (TID) | ORAL | 0 refills | Status: DC | PRN

## 2021-04-13 MED ORDER — OXYCODONE-ACETAMINOPHEN 5-325 MG PO TABS
1.0000 | ORAL_TABLET | Freq: Once | ORAL | Status: AC
  Administered 2021-04-13: 1 via ORAL
  Filled 2021-04-13: qty 1

## 2021-04-13 MED ORDER — TRAMADOL HCL 50 MG PO TABS: 50.0000 mg | ORAL_TABLET | Freq: Four times a day (QID) | ORAL | 0 refills | Status: DC | PRN

## 2021-04-13 MED ORDER — PREDNISONE 20 MG PO TABS
60.0000 mg | ORAL_TABLET | Freq: Once | ORAL | Status: AC
  Administered 2021-04-13: 60 mg via ORAL
  Filled 2021-04-13: qty 3

## 2021-04-13 MED ORDER — METHOCARBAMOL 500 MG PO TABS
750.0000 mg | ORAL_TABLET | Freq: Once | ORAL | Status: AC
  Administered 2021-04-13: 750 mg via ORAL
  Filled 2021-04-13: qty 2

## 2021-04-13 NOTE — ED Notes (Signed)
Patient transported to X-ray 

## 2021-04-13 NOTE — ED Triage Notes (Signed)
Pt here with reports of R lower back spasms onset 4 days ago.  ?

## 2021-04-13 NOTE — Discharge Instructions (Signed)
It was our pleasure to provide your ER care today - we hope that you feel better. ? ?Your xrays show mild compression fracture of L3 (noted prior) and L4, as well as degenerative disc. ? ?Try to find position of comfort, may try heat therapy to sore area, avoid heaving lifting or bending at waist for the next week.   ? ?Take prednisone as prescribed. Take acetaminophen as need for pain.  You may also try ultram for pain, and robaxin as need for muscle pain/spasm - no driving when taking  these meds.  ? ?Follow up with your doctor/back specialist in the next 1-2 weeks. ? ?Return to ER if worse, new symptoms, severe/intractable pain, numbness/weakness, or other concern.  ?

## 2021-04-13 NOTE — ED Provider Notes (Addendum)
?[facility] ?Provider Note ? ? ?CSN: [id number] ?Arrival date & time: 04/13/21  [id number] ? ?  ? ?History ? ?Chief Complaint  ?Patient presents with  ? Back Pain  ? ? ?WELCOME Eric Cook is a 76 y.o. male. ? ?Pt with hx ddd, c/o low back pain in past 3-4 days. Similar symptoms in past. Denies specific injury or strain. Symptoms acute onset, moderate, persistent, radiate towards right buttock and posterior leg. No leg numbness/weakness. No saddle area numbness. No change in normal bowel/bladder function and control.. no fever or chills.  ? ?The history is provided by the patient, medical records and a relative.  ?Back Pain ?Associated symptoms: no abdominal pain, no dysuria, no fever, no numbness and no weakness   ? ?  ? ?Home Medications ?Prior to Admission medications   ?Medication Sig Start Date End Date Taking? Authorizing Provider  ?escitalopram (LEXAPRO) 10 MG tablet TAKE 1 AND 1/2 TABLETS EVERY DAY 11/21/20   Ronnie Doss M, DO  ?levETIRAcetam (KEPPRA XR) 500 MG 24 hr tablet TAKE 1 TABLET AT BEDTIME 01/24/21   Frann Rider, NP  ?lisinopril-hydrochlorothiazide (ZESTORETIC) 10-12.5 MG tablet TAKE 1 TABLET EVERY DAY 01/04/21   Ronnie Doss M, DO  ?simvastatin (ZOCOR) 20 MG tablet TAKE 1 TABLET (20 MG TOTAL) BY MOUTH DAILY AT 6 PM. 01/04/21   Janora Norlander, DO  ?tamsulosin (FLOMAX) 0.4 MG CAPS capsule Take 1 capsule (0.4 mg total) by mouth daily. 08/23/20   Janora Norlander, DO  ?   ? ?Allergies    ?Patient has no known allergies.   ? ?Review of Systems   ?Review of Systems  ?Constitutional:  Negative for chills and fever.  ?Gastrointestinal:  Negative for abdominal pain and vomiting.  ?Genitourinary:  Negative for dysuria, flank pain and hematuria.  ?Musculoskeletal:  Positive for back pain.  ?Neurological:  Negative for weakness and numbness.  ? ?Physical Exam ?Updated Vital Signs ?BP 140/78 (BP Location: Right Arm)   Pulse 66   Temp 97.6 ?F (36.4 ?C) (Oral)    Resp 18   SpO2 97%  ?Physical Exam ?Vitals and nursing note reviewed.  ?Constitutional:   ?   Appearance: Normal appearance. He is well-developed.  ?HENT:  ?   Head: Atraumatic.  ?   Nose: Nose normal.  ?   Mouth/Throat:  ?   Mouth: Mucous membranes are moist.  ?Eyes:  ?   General: No scleral icterus. ?   Conjunctiva/sclera: Conjunctivae normal.  ?Neck:  ?   Trachea: No tracheal deviation.  ?Cardiovascular:  ?   Rate and Rhythm: Normal rate and regular rhythm.  ?   Pulses: Normal pulses.  ?   Heart sounds: Normal heart sounds. No murmur heard. ?  No friction rub. No gallop.  ?Pulmonary:  ?   Effort: Pulmonary effort is normal. No accessory muscle usage or respiratory distress.  ?   Breath sounds: Normal breath sounds.  ?Abdominal:  ?   General: There is no distension.  ?   Palpations: There is no mass.  ?   Tenderness: There is no abdominal tenderness.  ?   Comments: No pulsatile mass.   ?Genitourinary: ?   Comments: No cva tenderness. ?Musculoskeletal:     ?   General: No swelling.  ?   Cervical back: Neck supple.  ?   Comments: Mid to lower lumbar tenderness, otherwise, T/L/S spine non tender, aligned.   ?Skin: ?   General: Skin is warm and dry.  ?  Findings: No rash.  ?   Comments: No rash/skin lesions in area of pain.   ?Neurological:  ?   Mental Status: He is alert.  ?   Comments: Alert, speech clear. Motor fxn intact RLE stre 5/5. Sens grossly intact. Reflex 2+. Steady gait.   ?Psychiatric:     ?   Mood and Affect: Mood normal.  ? ? ?ED Results / Procedures / Treatments   ?Labs ?(all labs ordered are listed, but only abnormal results are displayed) ?Labs Reviewed - No data to display ? ?EKG ?None ? ?Radiology ?DG Lumbar Spine Complete ? ?Result Date: 04/13/2021 ?CLINICAL DATA:  Back pain EXAM: LUMBAR SPINE - COMPLETE 4+ VIEW COMPARISON:  08/25/2019 FINDINGS: Redemonstrated moderate superior endplate compression fracture of L3. Degree of height loss appears mildly progressed compared to prior. New subtle  superior endplate depression at L4 with less than 10% vertebral body height loss. Bones are diffusely demineralized. No static listhesis. Similar minimal degenerative changes. Advanced abdominal aortic atherosclerotic calcification. IMPRESSION: 1. New subtle superior endplate depression at L4 with less than 10% vertebral body height loss. MRI could be performed to assess the acuity of this finding, as clinically indicated. 2. Redemonstration of moderate superior endplate compression fracture of L3, with mild progression of height loss compared to prior. Electronically Signed   By: Davina Poke D.O.   On: 04/13/2021 09:11   ? ?Procedures ?Procedures  ? ? ?Medications Ordered in ED ?Medications  ?predniSONE (DELTASONE) tablet 60 mg (has no administration in time range)  ?oxyCODONE-acetaminophen (PERCOCET/ROXICET) 5-325 MG per tablet 1 tablet (has no administration in time range)  ?methocarbamol (ROBAXIN) tablet 750 mg (has no administration in time range)  ? ? ?ED Course/ Medical Decision Making/ A&P ?  ?                        ?Medical Decision Making ?Problems Addressed: ?Acute right-sided low back pain with right-sided sciatica: acute illness or injury ?Compression fracture of L4 vertebra, initial encounter [facility]): acute illness or injury ?Essential hypertension: chronic illness or injury ? ?Amount and/or Complexity of Data Reviewed ?Independent Historian:  ?   Details: family, additional hx ?Radiology: ordered and independent interpretation performed. Decision-making details documented in ED Course. ? ?Risk ?Prescription drug management. ? ?Imaging ordered.  ? ?Reviewed nursing notes and prior charts for additional history. External charts/imaging reviewed. Additional hx from family member.  ? ?Percocet po, robaxin po, prednisone po.  ? ?Xrays reviewed/interpreted by me - no new fx.  ? ?Pain improved. Pt ambulatory about room, comfortable appearing.  ? ?Pt appears stable for d/c.  ? ?Rx for home.   ? ? ? ? ? ? ? ? ? ?Final Clinical Impression(s) / ED Diagnoses ?Final diagnoses:  ?None  ? ? ?Rx / DC Orders ?ED Discharge Orders   ? ? None  ? ?  ? ? ? ? ?  ?Lajean Saver, MD ?04/13/21 1042 ? ?

## 2021-04-20 ENCOUNTER — Ambulatory Visit (INDEPENDENT_AMBULATORY_CARE_PROVIDER_SITE_OTHER): Payer: Medicare HMO | Admitting: Family Medicine

## 2021-04-20 ENCOUNTER — Encounter: Payer: Self-pay | Admitting: Family Medicine

## 2021-04-20 VITALS — BP 120/69 | HR 77 | Temp 97.4°F | Ht 69.0 in | Wt 208.6 lb

## 2021-04-20 DIAGNOSIS — I10 Essential (primary) hypertension: Secondary | ICD-10-CM | POA: Diagnosis not present

## 2021-04-20 DIAGNOSIS — E782 Mixed hyperlipidemia: Secondary | ICD-10-CM | POA: Diagnosis not present

## 2021-04-20 DIAGNOSIS — M5441 Lumbago with sciatica, right side: Secondary | ICD-10-CM

## 2021-04-20 MED ORDER — LISINOPRIL-HYDROCHLOROTHIAZIDE 10-12.5 MG PO TABS: 1.0000 | ORAL_TABLET | Freq: Every day | ORAL | 3 refills | Status: DC

## 2021-04-20 MED ORDER — TAMSULOSIN HCL 0.4 MG PO CAPS: 0.4000 mg | ORAL_CAPSULE | Freq: Every day | ORAL | 3 refills | Status: DC

## 2021-04-20 MED ORDER — SIMVASTATIN 20 MG PO TABS: 20.0000 mg | ORAL_TABLET | Freq: Every day | ORAL | 3 refills | Status: DC

## 2021-04-20 MED ORDER — METHOCARBAMOL 750 MG PO TABS: 750.0000 mg | ORAL_TABLET | Freq: Three times a day (TID) | ORAL | 0 refills | Status: DC | PRN

## 2021-04-20 MED ORDER — HYDROCODONE-ACETAMINOPHEN 5-325 MG PO TABS: 1.0000 | ORAL_TABLET | Freq: Four times a day (QID) | ORAL | 0 refills | Status: DC | PRN

## 2021-04-20 NOTE — Patient Instructions (Signed)
Remember NO TRAMADOL, WELLBUTRIN OR CHANTIX due to history of seizures!!!!! ?I have replaced the pain medicine with a TEMPORARY supply of hydrocodone. USE ONLY IF NEEDED. ? ?

## 2021-04-20 NOTE — Progress Notes (Signed)
? ?Subjective: ?CC: Follow-up back pain ?PCP: Raliegh Ip, DO ?RWE:Eric Cook is a 76 y.o. male presenting to clinic today for: ? ?1.  Back pain ?Patient was seen in the ER on 04/13/2021 for acute severe back pain.  He notes that the back pain does radiate down the right lower extremity.  He also had what he thinks were kidney stones that he passed.  Plain films were obtained in the ER but he did not get any information about those.  He was treated with medications in the ER and then sent home with tramadol, prednisone and Robaxin.  Of note, he is still treated with Keppra for seizure disorder and there were no plans to discontinue this.  Back pain is ongoing.  He is holding off on seeing his spinal specialist because he would like to give himself another week or so before making a commitment to see them. ? ? ?ROS: Per HPI ? ?No Known Allergies ?Past Medical History:  ?Diagnosis Date  ? Anxiety   ? Hyperlipidemia   ? Hypertension   ? Seizures (HCC)   ? medication related  ? ? ?Current Outpatient Medications:  ?  escitalopram (LEXAPRO) 10 MG tablet, TAKE 1 AND 1/2 TABLETS EVERY DAY, Disp: 135 tablet, Rfl: 3 ?  levETIRAcetam (KEPPRA XR) 500 MG 24 hr tablet, TAKE 1 TABLET AT BEDTIME, Disp: 90 tablet, Rfl: 2 ?  lisinopril-hydrochlorothiazide (ZESTORETIC) 10-12.5 MG tablet, TAKE 1 TABLET EVERY DAY, Disp: 90 tablet, Rfl: 0 ?  methocarbamol (ROBAXIN) 750 MG tablet, Take 1 tablet (750 mg total) by mouth 3 (three) times daily as needed (muscle spasm/pain)., Disp: 15 tablet, Rfl: 0 ?  simvastatin (ZOCOR) 20 MG tablet, TAKE 1 TABLET (20 MG TOTAL) BY MOUTH DAILY AT 6 PM., Disp: 90 tablet, Rfl: 0 ?  tamsulosin (FLOMAX) 0.4 MG CAPS capsule, Take 1 capsule (0.4 mg total) by mouth daily., Disp: 90 capsule, Rfl: 3 ?  traMADol (ULTRAM) 50 MG tablet, Take 1 tablet (50 mg total) by mouth every 6 (six) hours as needed., Disp: 15 tablet, Rfl: 0 ?Social History  ? ?Socioeconomic History  ? Marital status: Single  ?  Spouse  name: Not on file  ? Number of children: 1  ? Years of education: Not on file  ? Highest education level: Not on file  ?Occupational History  ? 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 Smoking status: Former  ?  Packs/day: 0.50  ?  Years: 30.00  ?  Pack years: 15.00  ?  Types: Cigarettes  ?  Quit date: 04/2019  ?  Years since quitting: 1.9  ? Smokeless tobacco: Never  ?Vaping Use  ? Vaping Use: Never used  ?Substance and Sexual Activity  ? Alcohol use: Never  ? Drug use: Never  ? Sexual activity: Never  ?Other Topics Concern  ? Not on file  ?Social History Narrative  ? Lives alone  ? Right Handed  ? Drinks 2-3 cups of caffeine daily  ? Son in [place]  ? ?Social Determinants of Health  ? ?Financial Resource Strain: Low Risk   ? Difficulty of Paying Living Expenses: Not hard at all  ?Food Insecurity: No Food Insecurity  ? Worried About Programme researcher, broadcasting/film/video in the Last Year: Never true  ? Ran Out of Food in the Last Year: Never true  ?Transportation Needs: No Transportation Needs  ? Lack of Transportation (Medical): No  ? Lack of Transportation (Non-Medical): No  ?  Physical Activity: Not on file  ?Stress: No Stress Concern Present  ? Feeling of Stress : Only a little  ?Social Connections: Socially Isolated  ? Frequency of Communication with Friends and Family: More than three times a week  ? Frequency of Social Gatherings with Friends and Family: Twice a week  ? Attends Religious Services: Never  ? Active Member of Clubs or Organizations: No  ? Attends Banker Meetings: Never  ? Marital Status: Divorced  ?Intimate Partner Violence: Not At Risk  ? Fear of Current or Ex-Partner: No  ? Emotionally Abused: No  ? Physically Abused: No  ? Sexually Abused: No  ? ?History reviewed. No pertinent family history. ? ?Objective: ?Office vital signs reviewed. ?BP 120/69   Pulse 77   Temp (!) 97.4 ?F (36.3 ?C)   Ht 5\' 9"  (1.753 m)   Wt 208 lb 9.6 oz (94.6 kg)   SpO2 97%   BMI  30.80 kg/m?  ? ?Physical Examination:  ?General: Awake, alert, uncomfortable appearing ?HEENT: Sclera white ?Cardio: regular rate and rhythm  ?Pulm: Normal work of breathing on room air ?MSK: Extremely antalgic gait.  He has a lumbar brace in place.  He is ambulating independently ? ?DG Lumbar Spine Complete ? ?Result Date: 04/13/2021 ?CLINICAL DATA:  Back pain EXAM: LUMBAR SPINE - COMPLETE 4+ VIEW COMPARISON:  08/25/2019 FINDINGS: Redemonstrated moderate superior endplate compression fracture of L3. Degree of height loss appears mildly progressed compared to prior. New subtle superior endplate depression at L4 with less than 10% vertebral body height loss. Bones are diffusely demineralized. No static listhesis. Similar minimal degenerative changes. Advanced abdominal aortic atherosclerotic calcification. IMPRESSION: 1. New subtle superior endplate depression at L4 with less than 10% vertebral body height loss. MRI could be performed to assess the acuity of this finding, as clinically indicated. 2. Redemonstration of moderate superior endplate compression fracture of L3, with mild progression of height loss compared to prior. Electronically Signed   By: 10/25/2019 D.O.   On: 04/13/2021 09:11   ? ? ?Assessment/ Plan: ?76 y.o. male  ? ?Acute right-sided low back pain with right-sided sciatica - Plan: methocarbamol (ROBAXIN) 750 MG tablet, HYDROcodone-acetaminophen (NORCO) 5-325 MG tablet ? ?Essential hypertension - Plan: lisinopril-hydrochlorothiazide (ZESTORETIC) 10-12.5 MG tablet ? ?Mixed hyperlipidemia - Plan: simvastatin (ZOCOR) 20 MG tablet ? ?I reviewed the ER note for this patient.  I am concerned that he was placed on tramadol given history of seizure disorder so I have discontinued that medication replaced with Norco for as needed use.  Discussed potential for dependency on this class of medication and did not recommend using it unless absolutely needed.  Caution sedation, constipation.  Use Robaxin  sparingly as well.  Highly recommended that he follow-up with his spinal specialist, particularly if symptoms are not getting better.  He may need an MRI to further evaluate the small endplate depression noted at the L4 level on x-ray.  Both he and his son voiced good understanding and he will follow-up as needed on this issue ? ?Blood pressure is controlled.  No changes.  Renewal of medications has been sent. ? ?The Narcotic Database has been reviewed.  There were no red flags.   ? ? ?No orders of the defined types were placed in this encounter. ? ?No orders of the defined types were placed in this encounter. ? ? ? ?61, DO ?[facility] ?[phone] ? ?

## 2021-05-01 ENCOUNTER — Other Ambulatory Visit: Payer: Self-pay | Admitting: Family Medicine

## 2021-05-01 ENCOUNTER — Telehealth: Payer: Self-pay | Admitting: Family Medicine

## 2021-05-01 DIAGNOSIS — K219 Gastro-esophageal reflux disease without esophagitis: Secondary | ICD-10-CM

## 2021-05-01 MED ORDER — CIMETIDINE 400 MG PO TABS: 400.0000 mg | ORAL_TABLET | Freq: Two times a day (BID) | ORAL | 1 refills | Status: DC | PRN

## 2021-05-01 NOTE — Telephone Encounter (Signed)
Patient is requesting a refill on tagamet 400mg . It looks like it was taken off his medication list. He states we were supposed to refill it at last appointment.  ?

## 2021-05-01 NOTE — Telephone Encounter (Signed)
Sent!

## 2021-06-20 ENCOUNTER — Ambulatory Visit (INDEPENDENT_AMBULATORY_CARE_PROVIDER_SITE_OTHER): Payer: Medicare HMO

## 2021-06-20 ENCOUNTER — Telehealth: Payer: Self-pay

## 2021-06-20 VITALS — Wt 208.0 lb

## 2021-06-20 DIAGNOSIS — Z0000 Encounter for general adult medical examination without abnormal findings: Secondary | ICD-10-CM | POA: Diagnosis not present

## 2021-06-20 DIAGNOSIS — K529 Noninfective gastroenteritis and colitis, unspecified: Secondary | ICD-10-CM

## 2021-06-20 NOTE — Patient Instructions (Addendum)
Eric Cook , Thank you for taking time to come for your Medicare Wellness Visit. I appreciate your ongoing commitment to your health goals. Please review the following plan we discussed and let me know if I can assist you in the future.   Screening recommendations/referrals: Colonoscopy: Cologuard done every 3 years with insurance - We will see if Dr Nadine Counts will send referral to Gastroenterology Recommended yearly ophthalmology/optometry visit for glaucoma screening and checkup Recommended yearly dental visit for hygiene and checkup  Vaccinations: Influenza vaccine: Done 11/03/2020 - Repeat annually Pneumococcal vaccine: Done 10/14/2019 & 11/03/2020   Tdap vaccine: Declined - recommended every 10 years Shingles vaccine: Declined - Shingrix is 2 doses 2-6 months apart and over 90% effective   Covid-19:  Done 03/18/2019, 04/15/2019, & 12/01/2019  Advanced directives: Please bring a copy of your health care power of attorney and living will to the office to be added to your chart at your convenience.   Conditions/risks identified: Aim for 30 minutes of exercise or brisk walking, 6-8 glasses of water, and 5 servings of fruits and vegetables each day.   Next appointment: Follow up in one year for your annual wellness visit.   Preventive Care 75 Years and Older, Male  Preventive care refers to lifestyle choices and visits with your health care provider that can promote health and wellness. What does preventive care include? A yearly physical exam. This is also called an annual well check. Dental exams once or twice a year. Routine eye exams. Ask your health care provider how often you should have your eyes checked. Personal lifestyle choices, including: Daily care of your teeth and gums. Regular physical activity. Eating a healthy diet. Avoiding tobacco and drug use. Limiting alcohol use. Practicing safe sex. Taking low doses of aspirin every day. Taking vitamin and mineral supplements  as recommended by your health care provider. What happens during an annual well check? The services and screenings done by your health care provider during your annual well check will depend on your age, overall health, lifestyle risk factors, and family history of disease. Counseling  Your health care provider may ask you questions about your: Alcohol use. Tobacco use. Drug use. Emotional well-being. Home and relationship well-being. Sexual activity. Eating habits. History of falls. Memory and ability to understand (cognition). Work and work Astronomer. Screening  You may have the following tests or measurements: Height, weight, and BMI. Blood pressure. Lipid and cholesterol levels. These may be checked every 5 years, or more frequently if you are over 69 years old. Skin check. Lung cancer screening. You may have this screening every year starting at age 75 if you have a 30-pack-year history of smoking and currently smoke or have quit within the past 15 years. Fecal occult blood test (FOBT) of the stool. You may have this test every year starting at age 75. Flexible sigmoidoscopy or colonoscopy. You may have a sigmoidoscopy every 5 years or a colonoscopy every 10 years starting at age 75. Prostate cancer screening. Recommendations will vary depending on your family history and other risks. Hepatitis C blood test. Hepatitis B blood test. Sexually transmitted disease (STD) testing. Diabetes screening. This is done by checking your blood sugar (glucose) after you have not eaten for a while (fasting). You may have this done every 1-3 years. Abdominal aortic aneurysm (AAA) screening. You may need this if you are a current or former smoker. Osteoporosis. You may be screened starting at age 75 if you are at high risk. Talk with  your health care provider about your test results, treatment options, and if necessary, the need for more tests. Vaccines  Your health care provider may recommend  certain vaccines, such as: Influenza vaccine. This is recommended every year. Tetanus, diphtheria, and acellular pertussis (Tdap, Td) vaccine. You may need a Td booster every 10 years. Zoster vaccine. You may need this after age 75. Pneumococcal 13-valent conjugate (PCV13) vaccine. One dose is recommended after age 75. Pneumococcal polysaccharide (PPSV23) vaccine. One dose is recommended after age 75. Talk to your health care provider about which screenings and vaccines you need and how often you need them. This information is not intended to replace advice given to you by your health care provider. Make sure you discuss any questions you have with your health care provider. Document Released: 02/04/2015 Document Revised: 09/28/2015 Document Reviewed: 11/09/2014 Elsevier Interactive Patient Education  2017 [place] Prevention in the Home Falls can cause injuries. They can happen to people of all ages. There are many things you can do to make your home safe and to help prevent falls. What can I do on the outside of my home? Regularly fix the edges of walkways and driveways and fix any cracks. Remove anything that might make you trip as you walk through a door, such as a raised step or threshold. Trim any bushes or trees on the path to your home. Use bright outdoor lighting. Clear any walking paths of anything that might make someone trip, such as rocks or tools. Regularly check to see if handrails are loose or broken. Make sure that both sides of any steps have handrails. Any raised decks and porches should have guardrails on the edges. Have any leaves, snow, or ice cleared regularly. Use sand or salt on walking paths during winter. Clean up any spills in your garage right away. This includes oil or grease spills. What can I do in the bathroom? Use night lights. Install grab bars by the toilet and in the tub and shower. Do not use towel bars as grab bars. Use non-skid mats or  decals in the tub or shower. If you need to sit down in the shower, use a plastic, non-slip stool. Keep the floor dry. Clean up any water that spills on the floor as soon as it happens. Remove soap buildup in the tub or shower regularly. Attach bath mats securely with double-sided non-slip rug tape. Do not have throw rugs and other things on the floor that can make you trip. What can I do in the bedroom? Use night lights. Make sure that you have a light by your bed that is easy to reach. Do not use any sheets or blankets that are too big for your bed. They should not hang down onto the floor. Have a firm chair that has side arms. You can use this for support while you get dressed. Do not have throw rugs and other things on the floor that can make you trip. What can I do in the kitchen? Clean up any spills right away. Avoid walking on wet floors. Keep items that you use a lot in easy-to-reach places. If you need to reach something above you, use a strong step stool that has a grab bar. Keep electrical cords out of the way. Do not use floor polish or wax that makes floors slippery. If you must use wax, use non-skid floor wax. Do not have throw rugs and other things on the floor that can make you trip.  What can I do with my stairs? Do not leave any items on the stairs. Make sure that there are handrails on both sides of the stairs and use them. Fix handrails that are broken or loose. Make sure that handrails are as long as the stairways. Check any carpeting to make sure that it is firmly attached to the stairs. Fix any carpet that is loose or worn. Avoid having throw rugs at the top or bottom of the stairs. If you do have throw rugs, attach them to the floor with carpet tape. Make sure that you have a light switch at the top of the stairs and the bottom of the stairs. If you do not have them, ask someone to add them for you. What else can I do to help prevent falls? Wear shoes that: Do not  have high heels. Have rubber bottoms. Are comfortable and fit you well. Are closed at the toe. Do not wear sandals. If you use a stepladder: Make sure that it is fully opened. Do not climb a closed stepladder. Make sure that both sides of the stepladder are locked into place. Ask someone to hold it for you, if possible. Clearly mark and make sure that you can see: Any grab bars or handrails. First and last steps. Where the edge of each step is. Use tools that help you move around (mobility aids) if they are needed. These include: Canes. Walkers. Scooters. Crutches. Turn on the lights when you go into a dark area. Replace any light bulbs as soon as they burn out. Set up your furniture so you have a clear path. Avoid moving your furniture around. If any of your floors are uneven, fix them. If there are any pets around you, be aware of where they are. Review your medicines with your doctor. Some medicines can make you feel dizzy. This can increase your chance of falling. Ask your doctor what other things that you can do to help prevent falls. This information is not intended to replace advice given to you by your health care provider. Make sure you discuss any questions you have with your health care provider. Document Released: 11/04/2008 Document Revised: 06/16/2015 Document Reviewed: 02/12/2014 Elsevier Interactive Patient Education  2017 [employer]   Diet for Irritable Bowel Syndrome When you have irritable bowel syndrome (IBS), it is very important to follow the eating habits that are best for your condition. IBS may cause various symptoms, such as pain in the abdomen, constipation, or diarrhea. Choosing the right foods can help to ease the discomfort from these symptoms. Work with your health care provider and dietitian to find the eating plan that will help to control your symptoms. What are tips for following this plan?  Keep a food diary. This will help you identify foods  that cause symptoms. Write down: What you eat and when you eat it. What symptoms you have. When symptoms occur in relation to your meals, such as "pain in abdomen 2 hours after dinner." Eat your meals slowly and in a relaxed setting. Aim to eat 5-6 small meals per day. Do not skip meals. Drink enough fluid to keep your urine pale yellow. Ask your health care provider if you should take an over-the-counter probiotic to help restore healthy bacteria in your gut (digestive tract). Probiotics are foods that contain good bacteria and yeasts. Your dietitian may have specific dietary recommendations for you based on your symptoms. Your dietitian may recommend that you: Avoid foods that cause symptoms. Talk  with your dietitian about other ways to get the same nutrients that are in those problem foods. Avoid foods with gluten. Gluten is a protein that is found in rye, wheat, and barley. Eat more foods that contain soluble fiber. Examples of foods with high soluble fiber include oats, seeds, and certain fruits and vegetables. Take a fiber supplement if told by your dietitian. Reduce or avoid certain foods called FODMAPs. These are foods that contain sugars that are hard for some people to digest. Ask your health care provider which foods to avoid. What foods should I avoid? The following are some foods and drinks that may make your symptoms worse: Fatty foods, such as french fries. Foods that contain gluten, such as pasta and cereal. Dairy products, such as milk, cheese, and ice cream. Spicy foods. Alcohol. Products with caffeine, such as coffee, tea, or chocolate. Carbonated drinks, such as soda. Foods that are high in FODMAPs. These include certain fruits and vegetables. Products with sweeteners such as honey, high fructose corn syrup, sorbitol, and mannitol. The items listed above may not be a complete list of foods and beverages you should avoid. Contact a dietitian for more information. What  foods are good sources of fiber? Your health care provider or dietitian may recommend that you eat more foods that contain fiber. Fiber can help to reduce constipation and other IBS symptoms. Add foods with fiber to your diet a little at a time so your body can get used to them. Too much fiber at one time might cause gas and swelling of your abdomen. The following are some foods that are good sources of fiber: Berries, such as raspberries, strawberries, and blueberries. Tomatoes. Carrots. Brown rice. Oats. Seeds, such as chia and pumpkin seeds. The items listed above may not be a complete list of recommended sources of fiber. Contact your dietitian for more options. Where to find more information International Foundation for Functional Gastrointestinal Disorders: aboutibs.[employer] National Institute of Diabetes and Digestive and Kidney Diseases: [employer].siniddk.nih.gov Summary When you have irritable bowel syndrome (IBS), it is very important to follow the eating habits that are best for your condition. IBS may cause various symptoms, such as pain in the abdomen, constipation, or diarrhea. Choosing the right foods can help to ease the discomfort that comes from symptoms. Your health care provider or dietitian may recommend that you eat more foods that contain fiber. Keep a food diary. This will help you identify foods that cause symptoms. This information is not intended to replace advice given to you by your health care provider. Make sure you discuss any questions you have with your health care provider. Document Revised: 12/20/2020 Document Reviewed: 12/20/2020 Elsevier Patient Education  2023 [employer]

## 2021-06-20 NOTE — Telephone Encounter (Signed)
He wants to know if you can refer him to GI for his fecal urgency and diarrhea that has been ongoing for years. Also wants referral to Urologist for hx of kidney stones and pain in kidneys - worried about his prostate. Thank you!

## 2021-06-20 NOTE — Telephone Encounter (Signed)
Pt prefers  for gi referral.

## 2021-06-20 NOTE — Telephone Encounter (Signed)
Referral to GI placed

## 2021-06-20 NOTE — Addendum Note (Signed)
Addended by: Raliegh Ip on: 06/20/2021 12:52 PM   Modules accepted: Orders

## 2021-06-20 NOTE — Telephone Encounter (Signed)
Referred to urology back in 2021.  Should still be established with [facility] and just needs to call for appt.  Who has done his previous colonoscopies so I know who to send to for GI

## 2021-06-20 NOTE — Progress Notes (Signed)
Subjective:   Eric Cook is a 76 y.o. male who presents for Medicare Annual/Subsequent preventive examination.  Virtual Visit via Telephone Note  I connected with  [name] on 06/20/21 at 10:30 AM EDT by telephone and verified that I am speaking with the correct person using two identifiers.  Location: Patient: Home Provider: [facility] Persons participating in the virtual visit: patient/Nurse Health Advisor   I discussed the limitations, risks, security and privacy concerns of performing an evaluation and management service by telephone and the availability of in person appointments. The patient expressed understanding and agreed to proceed.  Interactive audio and video telecommunications were attempted between this nurse and patient, however failed, due to patient having technical difficulties OR patient did not have access to video capability.  We continued and completed visit with audio only.  Some vital signs may be absent or patient reported.   Eric Cook E Eric Wiedel, LPN   Review of Systems     Cardiac Risk Factors include: advanced age (>69men, >67 women);male gender;hypertension;smoking/ tobacco exposure;sedentary lifestyle;obesity (BMI >30kg/m2)     Objective:    Today's Vitals   06/20/21 1035 06/20/21 1036  Weight: 208 lb (94.3 kg)   PainSc:  5    Body mass index is 30.72 kg/m.     06/20/2021   10:49 AM 04/13/2021    7:50 AM 06/16/2020   12:02 PM 08/25/2019    4:30 PM  Advanced Directives  Does Patient Have a Medical Advance Directive? Yes No No No  Type of Paramedic of [place];Living will     Copy of [place] in Chart? No - copy requested     Would patient like information on creating a medical advance directive?   No - Patient declined No - Patient declined    Current Medications (verified) Outpatient Encounter Medications as of 06/20/2021  Medication Sig   cimetidine (TAGAMET) 400 MG tablet Take 1 tablet (400 mg  total) by mouth 2 (two) times daily as needed.   escitalopram (LEXAPRO) 10 MG tablet TAKE 1 AND 1/2 TABLETS EVERY DAY   HYDROcodone-acetaminophen (NORCO) 5-325 MG tablet Take 1 tablet by mouth every 6 (six) hours as needed for severe pain.   levETIRAcetam (KEPPRA XR) 500 MG 24 hr tablet TAKE 1 TABLET AT BEDTIME   lisinopril-hydrochlorothiazide (ZESTORETIC) 10-12.5 MG tablet Take 1 tablet by mouth daily.   methocarbamol (ROBAXIN) 750 MG tablet Take 1 tablet (750 mg total) by mouth 3 (three) times daily as needed (muscle spasm/pain).   simvastatin (ZOCOR) 20 MG tablet Take 1 tablet (20 mg total) by mouth daily at 6 PM.   tamsulosin (FLOMAX) 0.4 MG CAPS capsule Take 1 capsule (0.4 mg total) by mouth daily.   No facility-administered encounter medications on file as of 06/20/2021.    Allergies (verified) Patient has no known allergies.   History: Past Medical History:  Diagnosis Date   Anxiety    Hyperlipidemia    Hypertension    Seizures ([place])    medication related   Past Surgical History:  Procedure Laterality Date   BACK SURGERY     disc removal   History reviewed. No pertinent family history. Social History   Socioeconomic History   Marital status: Single    Spouse name: Not on file   Number of children: 1   Years of education: Not on file   Highest education level: Not on file  Occupational History   Occupation: Film/video editor  Comment: part time  Tobacco Use   Smoking status: Former    Packs/day: 0.50    Years: 30.00    Pack years: 15.00    Types: Cigarettes    Quit date: 04/2019    Years since quitting: 2.1   Smokeless tobacco: Never  Vaping Use   Vaping Use: Never used  Substance and Sexual Activity   Alcohol use: Never   Drug use: Never   Sexual activity: Never  Other Topics Concern   Not on file  Social History Narrative   Lives alone   Right Handed   Drinks 2-3 cups of caffeine daily   Son in [place] Strain: Low Risk    Difficulty of Paying Living Expenses: Not hard at all  Food Insecurity: No Food Insecurity   Worried About Charity fundraiser in the Last Year: Never true   [place] in the Last Year: Never true  Transportation Needs: No Transportation Needs   Lack of Transportation (Medical): No   Lack of Transportation (Non-Medical): No  Physical Activity: Sufficiently Active   Days of Exercise per Week: 7 days   Minutes of Exercise per Session: 30 min  Stress: No Stress Concern Present   Feeling of Stress : Only a little  Social Connections: Socially Isolated   Frequency of Communication with Friends and Family: More than three times a week   Frequency of Social Gatherings with Friends and Family: Once a week   Attends Religious Services: Never   Marine scientist or Organizations: No   Attends Music therapist: Never   Marital Status: Divorced    Tobacco Counseling Counseling given: Not Answered   Clinical Intake:  Pre-visit preparation completed: Yes  Pain : 0-10 Pain Score: 5  Pain Type: Chronic pain Pain Location: Abdomen Pain Orientation: Right, Left, Lower Pain Radiating Towards: back Pain Descriptors / Indicators: Discomfort, Aching, Sore Pain Onset: More than a month ago Pain Frequency: Intermittent     BMI - recorded: 30.72 Nutritional Status: BMI > 30  Obese Nutritional Risks: Nausea/ vomitting/ diarrhea Diabetes: No  How often do you need to have someone help you when you read instructions, pamphlets, or other written materials from your doctor or pharmacy?: 1 - Never  Diabetic? no  Interpreter Needed?: No  Information entered by :: Haylee Mcanany, LPN   Activities of Daily Living    06/20/2021   10:47 AM  In your present state of health, do you have any difficulty performing the following activities:  Hearing? 0  Vision? 0  Difficulty concentrating or making decisions? 0  Walking or climbing  stairs? 0  Dressing or bathing? 0  Doing errands, shopping? 0  Preparing Food and eating ? N  Using the Toilet? N  In the past six months, have you accidently leaked urine? Y  Do you have problems with loss of bowel control? N  Managing your Medications? N  Managing your Finances? N  Housekeeping or managing your Housekeeping? N    Patient Care Team: Janora Norlander, DO as PCP - General (Family Medicine) Kathrynn Ducking, MD (Inactive) as Consulting Physician (Neurology) Ardis Hughs, MD as Attending Physician (Urology)  Indicate any recent Medical Services you may have received from other than [facility] providers in the past year (date may be approximate).     Assessment:   This is a routine wellness examination for Trevonne.  Hearing/Vision screen  Hearing Screening - Comments:: Denies hearing difficulties   Vision Screening - Comments:: Wears rx glasses - behind with routine eye exams with [facility]  Dietary issues and exercise activities discussed: Current Exercise Habits: Home exercise routine, Type of exercise: walking;Other - see comments (yard work, Social research officer, government), Time (Minutes): 20, Frequency (Times/Week): 7, Weekly Exercise (Minutes/Week): 140, Intensity: Mild, Exercise limited by: orthopedic condition(s)   Goals Addressed             This Visit's Progress    Exercise 3x per week (30 min per time)   On track    He wants to stay active and healthy; Get appetite back (has lost smell and taste since hospital visit in 08/2019 - possible seizure) 06/20/2021 - this is some better - he still has upset stomach and fecal urgency       Depression Screen    06/20/2021   10:45 AM 04/20/2021    3:09 PM 08/23/2020   10:09 AM 06/16/2020   11:36 AM 05/19/2020    9:58 AM 04/04/2020    8:28 AM 02/02/2020    8:31 AM  PHQ 2/9 Scores  PHQ - 2 Score 0 0 0 1 1 1 1   PHQ- 9 Score    6 6 5 4     Fall Risk    06/20/2021   10:40 AM 04/20/2021    3:09 PM 08/23/2020   10:09 AM 06/16/2020    11:44 AM 05/19/2020    9:58 AM  Fall Risk   Falls in the past year? 0 0 0 0 0  Number falls in past yr: 0   0   Injury with Fall? 0   0   Risk for fall due to : No Fall Risks   History of fall(s);Orthopedic patient;Other (Comment)   Risk for fall due to: Comment    hx of seizure activity   Follow up Falls prevention discussed   Education provided;Falls prevention discussed     FALL RISK PREVENTION PERTAINING TO THE HOME:  Any stairs in or around the home? Yes  If so, are there any without handrails? No  Home free of loose throw rugs in walkways, pet beds, electrical cords, etc? Yes  Adequate lighting in your home to reduce risk of falls? Yes   ASSISTIVE DEVICES UTILIZED TO PREVENT FALLS:  Life alert? No  Use of a cane, walker or w/c? No  Grab bars in the bathroom? No  Shower chair or bench in shower? No  Elevated toilet seat or a handicapped toilet? No   TIMED UP AND GO:  Was the test performed? No . Telephonic visit  Cognitive Function:        06/20/2021   10:50 AM 06/16/2020   12:03 PM  6CIT Screen  What Year? 0 points 0 points  What month? 0 points 0 points  What time? 0 points 0 points  Count back from 20 0 points 0 points  Months in reverse 2 points 2 points  Repeat phrase 4 points 2 points  Total Score 6 points 4 points    Immunizations Immunization History  Administered Date(s) Administered   Fluad Quad(high Dose 65+) 10/14/2019, 11/03/2020   Moderna Sars-Covid-2 Vaccination 03/18/2019, 04/15/2019, 12/01/2019   PNEUMOCOCCAL CONJUGATE-20 11/03/2020   Pneumococcal Conjugate-13 10/14/2019    TDAP status: Due, Education has been provided regarding the importance of this vaccine. Advised may receive this vaccine at local pharmacy or Health Dept. Aware to provide a copy of the vaccination record if obtained from local  pharmacy or Health Dept. Verbalized acceptance and understanding.  Flu Vaccine status: Up to date  Pneumococcal vaccine status: Up to  date  Covid-19 vaccine status: Completed vaccines  Qualifies for Shingles Vaccine? Yes   Zostavax completed No   Shingrix Completed?: No.    Education has been provided regarding the importance of this vaccine. Patient has been advised to call insurance company to determine out of pocket expense if they have not yet received this vaccine. Advised may also receive vaccine at local pharmacy or Health Dept. Verbalized acceptance and understanding.  Screening Tests Health Maintenance  Topic Date Due   Zoster Vaccines- Shingrix (1 of 2) Never done   COLONOSCOPY (Pts 45-45yrs Insurance coverage will need to be confirmed)  Never done   COVID-19 Vaccine (4 - Booster for Moderna series) 01/26/2020   INFLUENZA VACCINE  08/22/2021   Pneumonia Vaccine 55+ Years old  Completed   Hepatitis C Screening  Completed   HPV VACCINES  Aged Out   TETANUS/TDAP  Discontinued    Health Maintenance  Health Maintenance Due  Topic Date Due   Zoster Vaccines- Shingrix (1 of 2) Never done   COLONOSCOPY (Pts 45-6yrs Insurance coverage will need to be confirmed)  Never done   COVID-19 Vaccine (4 - Booster for Moderna series) 01/26/2020    Colorectal cancer screening: Type of screening: Cologuard. Completed 2021 with Insurance Co. Repeat every 3 years  Lung Cancer Screening: (Low Dose CT Chest recommended if Age 53-80 years, 30 pack-year currently smoking OR have quit w/in 15years.) does qualify.   Lung Cancer Screening Referral: sent message asking for referral from PCP - patient wants this  Additional Screening:  Hepatitis C Screening: does qualify; Completed 05/19/2020  Vision Screening: Recommended annual ophthalmology exams for early detection of glaucoma and other disorders of the eye. Is the patient up to date with their annual eye exam?  No  Who is the provider or what is the name of the office in which the patient attends annual eye exams? [facility] If pt is not established with a provider,  would they like to be referred to a provider to establish care? No .   Dental Screening: Recommended annual dental exams for proper oral hygiene  Community Resource Referral / Chronic Care Management: CRR required this visit?  No   CCM required this visit?  No      Plan:     I have personally reviewed and noted the following in the patient's chart:   Medical and social history Use of alcohol, tobacco or illicit drugs  Current medications and supplements including opioid prescriptions. Patient is not currently taking opioid prescriptions. Functional ability and status Nutritional status Physical activity Advanced directives List of other physicians Hospitalizations, surgeries, and ER visits in previous 12 months Vitals Screenings to include cognitive, depression, and falls Referrals and appointments  In addition, I have reviewed and discussed with patient certain preventive protocols, quality metrics, and best practice recommendations. A written personalized care plan for preventive services as well as general preventive health recommendations were provided to patient.     Sandrea Hammond, LPN   [id number]   Nurse Notes: Wants referral for low dose chest CT lung cancer screening - he qualifies for this according to new requirements as he smoked for 20+ years. Needs consult and referral from you.

## 2021-06-22 DIAGNOSIS — R1030 Lower abdominal pain, unspecified: Secondary | ICD-10-CM | POA: Diagnosis not present

## 2021-06-22 DIAGNOSIS — R1084 Generalized abdominal pain: Secondary | ICD-10-CM | POA: Diagnosis not present

## 2021-06-22 DIAGNOSIS — R102 Pelvic and perineal pain: Secondary | ICD-10-CM | POA: Diagnosis not present

## 2021-06-22 DIAGNOSIS — R3982 Chronic bladder pain: Secondary | ICD-10-CM | POA: Diagnosis not present

## 2021-06-22 DIAGNOSIS — N281 Cyst of kidney, acquired: Secondary | ICD-10-CM | POA: Diagnosis not present

## 2021-06-22 DIAGNOSIS — R109 Unspecified abdominal pain: Secondary | ICD-10-CM | POA: Diagnosis not present

## 2021-06-26 ENCOUNTER — Telehealth: Payer: Self-pay | Admitting: Family Medicine

## 2021-06-26 NOTE — Telephone Encounter (Signed)
We have not discussed this as I was not part of the AWV from last week.  Her recommendations was low dose CT for lung cancer screening.  If he desires referral to a lung specialist, I am happy to oblige but yes, he will need at least a telephone visit to discuss details so I have something to send to the pulmonology office.

## 2021-06-26 NOTE — Telephone Encounter (Signed)
Patient calling to get a referral to pulmonologist. Eric Cook he has discussed this with his PCP and talked to nurse about it when he had his AWV. Aware that he may need an appt for this to be done but wanted a message put in first. Please call back and advise.

## 2021-06-27 ENCOUNTER — Ambulatory Visit (INDEPENDENT_AMBULATORY_CARE_PROVIDER_SITE_OTHER): Payer: Medicare HMO | Admitting: Family Medicine

## 2021-06-27 DIAGNOSIS — R053 Chronic cough: Secondary | ICD-10-CM

## 2021-06-27 DIAGNOSIS — Z87891 Personal history of nicotine dependence: Secondary | ICD-10-CM

## 2021-06-27 NOTE — Telephone Encounter (Signed)
Pt called and made aware, scheduled for today

## 2021-06-27 NOTE — Progress Notes (Signed)
Telephone visit  Subjective: [id number] PCP: Raliegh Ip, DO PHX:[id number] Eric Cook is a 76 y.o. male calls for telephone consult today. Patient provides verbal consent for consult held via phone.  Due to COVID-19 pandemic this visit was conducted virtually. This visit type was conducted due to national recommendations for restrictions regarding the COVID-19 Pandemic (e.g. social distancing, sheltering in place) in an effort to limit this patient's exposure and mitigate transmission in our community. All issues noted in this document were discussed and addressed.  A physical exam was not performed with this format.   Location of patient: home Location of provider: [facility] Others present for call: none  1. Wants pulmonology referral Patient was a smoker for 35-40 years (smoked anywhere from 1.5 packs, quit 3 years ago).  He has a chronic nonproductive cough for years.  No worse than usual.  No weight loss, no night sweats.  No hemoptysis.  No reports of new onset shortness of breath etc.  He does want to proceed with low-dose CAT scan for lung cancer screening and does want to see a pulmonologist just for general checkup and evaluation of this chronic cough    ROS: Per HPI  No Known Allergies Past Medical History:  Diagnosis Date   Anxiety    Hyperlipidemia    Hypertension    Seizures ([facility])    medication related    Current Outpatient Medications:    cimetidine (TAGAMET) 400 MG tablet, Take 1 tablet (400 mg total) by mouth 2 (two) times daily as needed., Disp: 180 tablet, Rfl: 1   escitalopram (LEXAPRO) 10 MG tablet, TAKE 1 AND 1/2 TABLETS EVERY DAY, Disp: 135 tablet, Rfl: 3   HYDROcodone-acetaminophen (NORCO) 5-325 MG tablet, Take 1 tablet by mouth every 6 (six) hours as needed for severe pain., Disp: 15 tablet, Rfl: 0   levETIRAcetam (KEPPRA XR) 500 MG 24 hr tablet, TAKE 1 TABLET AT BEDTIME, Disp: 90 tablet, Rfl: 2   lisinopril-hydrochlorothiazide (ZESTORETIC) 10-12.5 MG  tablet, Take 1 tablet by mouth daily., Disp: 90 tablet, Rfl: 3   methocarbamol (ROBAXIN) 750 MG tablet, Take 1 tablet (750 mg total) by mouth 3 (three) times daily as needed (muscle spasm/pain)., Disp: 15 tablet, Rfl: 0   simvastatin (ZOCOR) 20 MG tablet, Take 1 tablet (20 mg total) by mouth daily at 6 PM., Disp: 90 tablet, Rfl: 3   tamsulosin (FLOMAX) 0.4 MG CAPS capsule, Take 1 capsule (0.4 mg total) by mouth daily., Disp: 90 capsule, Rfl: 3  Assessment/ Plan: 76 y.o. male   Chronic cough - Plan: Ambulatory referral to Pulmonology  Former heavy cigarette smoker (20-39 per day) - Plan: CT CHEST LUNG CA SCREEN LOW DOSE W/O CM, Ambulatory referral to Pulmonology  Suspect this is a chronic smoker's cough and he likely has an undiagnosed COPD.  Will obtain CT scan for lung cancer screening as he does meet the criteria with a greater than 30-pack-year history having quit smoking 3 years ago.  He will follow-up as needed  Start time: 11:04a End time: 11:14a  Total time spent on patient care (including telephone call/ virtual visit): 10 minutes  Tomiko Schoon Hulen Skains, DO [facility] [phone]

## 2021-07-06 ENCOUNTER — Encounter: Payer: Self-pay | Admitting: Family Medicine

## 2021-07-13 ENCOUNTER — Encounter: Payer: Self-pay | Admitting: Internal Medicine

## 2021-07-13 ENCOUNTER — Ambulatory Visit (INDEPENDENT_AMBULATORY_CARE_PROVIDER_SITE_OTHER): Payer: Medicare HMO | Admitting: Internal Medicine

## 2021-07-13 VITALS — BP 120/60 | HR 72 | Ht 68.0 in | Wt 205.4 lb

## 2021-07-13 DIAGNOSIS — R194 Change in bowel habit: Secondary | ICD-10-CM

## 2021-07-13 DIAGNOSIS — K219 Gastro-esophageal reflux disease without esophagitis: Secondary | ICD-10-CM

## 2021-07-13 DIAGNOSIS — R1084 Generalized abdominal pain: Secondary | ICD-10-CM

## 2021-07-13 DIAGNOSIS — R1310 Dysphagia, unspecified: Secondary | ICD-10-CM | POA: Diagnosis not present

## 2021-07-13 MED ORDER — PANTOPRAZOLE SODIUM 40 MG PO TBEC: 40.0000 mg | DELAYED_RELEASE_TABLET | Freq: Every day | ORAL | 3 refills | Status: DC

## 2021-07-13 NOTE — Patient Instructions (Signed)
If you are age 75 or older, your body mass index should be between 23-30. Your Body mass index is 31.23 kg/m. If this is out of the aforementioned range listed, please consider follow up with your Primary Care Provider.  If you are age 67 or younger, your body mass index should be between 19-25. Your Body mass index is 31.23 kg/m. If this is out of the aformentioned range listed, please consider follow up with your Primary Care Provider.   ________________________________________________________  The [facility] providers would like to encourage you to use [facility] to communicate with providers for non-urgent requests or questions.  Due to long hold times on the telephone, sending your provider a message by [facility] may be a faster and more efficient way to get a response.  Please allow 48 business hours for a response.  Please remember that this is for non-urgent requests.  _______________________________________________________  Eric Cook have been scheduled for an endoscopy. Please follow written instructions given to you at your visit today. If you use inhalers (even only as needed), please bring them with you on the day of your procedure.  We have sent the following medications to your pharmacy for you to pick up at your convenience:  Pantoprazole

## 2021-07-14 ENCOUNTER — Ambulatory Visit (AMBULATORY_SURGERY_CENTER): Payer: Medicare HMO | Admitting: Internal Medicine

## 2021-07-14 ENCOUNTER — Encounter: Payer: Self-pay | Admitting: Internal Medicine

## 2021-07-14 VITALS — BP 115/65 | HR 69 | Temp 97.5°F | Resp 19 | Ht 68.0 in | Wt 205.0 lb

## 2021-07-14 DIAGNOSIS — K222 Esophageal obstruction: Secondary | ICD-10-CM

## 2021-07-14 DIAGNOSIS — K449 Diaphragmatic hernia without obstruction or gangrene: Secondary | ICD-10-CM | POA: Diagnosis not present

## 2021-07-14 DIAGNOSIS — K219 Gastro-esophageal reflux disease without esophagitis: Secondary | ICD-10-CM

## 2021-07-14 DIAGNOSIS — R1310 Dysphagia, unspecified: Secondary | ICD-10-CM | POA: Diagnosis not present

## 2021-07-14 DIAGNOSIS — K209 Esophagitis, unspecified without bleeding: Secondary | ICD-10-CM

## 2021-07-14 MED ORDER — SODIUM CHLORIDE 0.9 % IV SOLN: 500.0000 mL | Freq: Once | INTRAVENOUS | Status: DC

## 2021-07-14 MED ORDER — DICYCLOMINE HCL 10 MG PO CAPS: 20.0000 mg | ORAL_CAPSULE | ORAL | 2 refills | Status: DC

## 2021-07-17 ENCOUNTER — Encounter: Payer: Self-pay | Admitting: Family Medicine

## 2021-07-17 ENCOUNTER — Ambulatory Visit (INDEPENDENT_AMBULATORY_CARE_PROVIDER_SITE_OTHER): Payer: Medicare HMO | Admitting: Family Medicine

## 2021-07-17 ENCOUNTER — Telehealth: Payer: Self-pay | Admitting: *Deleted

## 2021-07-17 VITALS — BP 129/69 | HR 79 | Temp 97.2°F | Resp 20 | Ht 68.0 in | Wt 205.0 lb

## 2021-07-17 DIAGNOSIS — Z87891 Personal history of nicotine dependence: Secondary | ICD-10-CM | POA: Diagnosis not present

## 2021-07-17 DIAGNOSIS — R053 Chronic cough: Secondary | ICD-10-CM

## 2021-07-17 DIAGNOSIS — I10 Essential (primary) hypertension: Secondary | ICD-10-CM

## 2021-07-21 ENCOUNTER — Encounter: Payer: Self-pay | Admitting: Pulmonary Disease

## 2021-07-21 ENCOUNTER — Ambulatory Visit: Payer: Medicare HMO | Admitting: Pulmonary Disease

## 2021-07-21 ENCOUNTER — Other Ambulatory Visit: Payer: Self-pay | Admitting: Internal Medicine

## 2021-07-21 ENCOUNTER — Ambulatory Visit (INDEPENDENT_AMBULATORY_CARE_PROVIDER_SITE_OTHER): Payer: Medicare HMO

## 2021-07-21 VITALS — BP 126/66 | HR 62 | Temp 98.7°F | Ht 68.0 in | Wt 209.6 lb

## 2021-07-21 DIAGNOSIS — R053 Chronic cough: Secondary | ICD-10-CM | POA: Diagnosis not present

## 2021-07-21 DIAGNOSIS — R1310 Dysphagia, unspecified: Secondary | ICD-10-CM

## 2021-07-21 DIAGNOSIS — K209 Esophagitis, unspecified without bleeding: Secondary | ICD-10-CM

## 2021-07-21 DIAGNOSIS — K219 Gastro-esophageal reflux disease without esophagitis: Secondary | ICD-10-CM

## 2021-07-21 DIAGNOSIS — K222 Esophageal obstruction: Secondary | ICD-10-CM

## 2021-07-21 MED ORDER — FLUTICASONE FUROATE-VILANTEROL 200-25 MCG/ACT IN AEPB: 1.0000 | INHALATION_SPRAY | Freq: Every day | RESPIRATORY_TRACT | 1 refills | Status: DC

## 2021-07-21 NOTE — Progress Notes (Signed)
@Patient  ID: , male    DOB: October 19, 1945, 76 y.o.   MRN: [id number]  Chief Complaint  Patient presents with   Consult    Pt is here for consult for chronic cough. Pt states that he has had the cough off and on for 20 years. Last two years got worse. Never had covid. Pt states sometimes his cough is productive. Pt states he has turned horse due to the cough. No inhalers noted. No cough medication seems to help either.     Referring provider: [id number], DO  HPI:   76 y.o. man whom are seen in consultation for evaluation of chronic cough.  Most recent note from referring provider reviewed.  Patient has daily cough.  At least present for 2 years.  On further questioning likely present longer.  Intermittent cough for 20+ years.  Worse in the mornings.  And in the evenings.  Productive of sputum about 40% of the time.  Sometimes has cough and cannot bring up sputum.  He is able to mobilize sputum cough seems to improve.  Recently put on PPI about 2 weeks ago.  This is helped cough about 30%.  Has decreased in frequency and severity of cough a bit.  He continues to have ongoing sputum production.  He does endorse nasal congestion, postnasal drip symptoms.  Uses Flonase daily.  He is a former smoker, 40-pack-year, updated today as was underrepresented in the social history tab.  No seasonal environmental factors he can identify that makes the cough better or worse.  No position makes things better or worse.  No other alleviating or exacerbating factors.  Has not used inhalers.  Reviewed most recent chest x-ray 08/2019 that on my review and interpretation reveals mild hyperinflation, bronchitic changes in the bases.  PMH: Stomach pain, hypertension, GERD, history of tobacco abuse in remission Surgical history: Reviewed, denies any Family history: No significant rest or illness in first relatives Social history: Former smoker, at least 40-pack-year, quit 2020  Questionaires /  Pulmonary Flowsheets:   ACT:      No data to display          MMRC:     No data to display          Epworth:      No data to display          Tests:   FENO:  No results found for: "NITRICOXIDE"  PFT:     No data to display          WALK:      No data to display          Imaging: Personally reviewed and as per EMR discussion this note No results found.  Lab Results: Personally reviewed CBC    Component Value Date/Time   WBC 5.3 08/23/2020 1022   WBC 7.7 08/23/2019 1324   RBC 4.45 08/23/2020 1022   RBC 4.66 08/23/2019 1324   HGB 14.5 08/23/2020 1022   HCT 42.5 08/23/2020 1022   PLT 189 08/23/2020 1022   MCV 96 08/23/2020 1022   MCH 32.6 08/23/2020 1022   MCH 32.6 08/23/2019 1324   MCHC 34.1 08/23/2020 1022   MCHC 35.5 08/23/2019 1324   RDW 12.8 08/23/2020 1022   LYMPHSABS 1.6 09/08/2019 1620   EOSABS 0.1 09/08/2019 1620   BASOSABS 0.0 09/08/2019 1620    BMET    Component Value Date/Time   NA 138 08/23/2020 1022   K 3.8 08/23/2020 1022  CL 100 08/23/2020 1022   CO2 25 08/23/2020 1022   GLUCOSE 106 (H) 08/23/2020 1022   GLUCOSE 93 08/25/2019 0443   BUN 16 08/23/2020 1022   CREATININE 1.15 08/23/2020 1022   CALCIUM 9.8 08/23/2020 1022   GFRNONAA 67 09/08/2019 1620   GFRAA 77 09/08/2019 1620    BNP    Component Value Date/Time   BNP 129.0 (H) 08/23/2019 1324    ProBNP No results found for: "PROBNP"  Specialty Problems   None   No Known Allergies  Immunization History  Administered Date(s) Administered   Fluad Quad(high Dose 65+) 10/14/2019, 11/03/2020   Moderna Sars-Covid-2 Vaccination 03/18/2019, 04/15/2019, 12/01/2019   PNEUMOCOCCAL CONJUGATE-20 11/03/2020   Pneumococcal Conjugate-13 10/14/2019    Past Medical History:  Diagnosis Date   Anxiety    HLD (hyperlipidemia)    Hyperlipidemia    Hypertension    Peptic ulcer    Seizures (HCC)    medication related    Tobacco History: Social History    Tobacco Use  Smoking Status Former   Packs/day: 0.50   Years: 30.00   Total pack years: 15.00   Types: Cigarettes   Quit date: 04/2019   Years since quitting: 2.2  Smokeless Tobacco Never   Counseling given: Not Answered   Continue to not smoke  Outpatient Encounter Medications as of 75/30/2023  Medication Sig   cimetidine (TAGAMET) 400 MG tablet Take 1 tablet (400 mg total) by mouth 2 (two) times daily as needed.   dicyclomine (BENTYL) 10 MG capsule Take 2 capsules (20 mg total) by mouth See admin instructions. Take 20 mg  by mouth 15 minutes before meals as needed   escitalopram (LEXAPRO) 10 MG tablet TAKE 1 AND 1/2 TABLETS EVERY DAY (Patient taking differently: as needed.)   fluticasone furoate-vilanterol (BREO ELLIPTA) 200-25 MCG/ACT AEPB Inhale 1 puff into the lungs daily.   levETIRAcetam (KEPPRA XR) 500 MG 24 hr tablet TAKE 1 TABLET AT BEDTIME   lisinopril-hydrochlorothiazide (ZESTORETIC) 10-12.5 MG tablet Take 1 tablet by mouth daily.   meloxicam (MOBIC) 15 MG tablet Take 15 mg by mouth daily.   pantoprazole (PROTONIX) 40 MG tablet Take 1 tablet (40 mg total) by mouth daily.   simvastatin (ZOCOR) 20 MG tablet Take 1 tablet (20 mg total) by mouth daily at 6 PM.   tamsulosin (FLOMAX) 0.4 MG CAPS capsule Take 1 capsule (0.4 mg total) by mouth daily.   No facility-administered encounter medications on file as of 07/21/2021.     Review of Systems  Review of Systems  No chest pain with exertion.  No orthopnea or PND.  Comprehensive review of systems otherwise negative. Physical Exam  BP 126/66 (BP Location: Left Arm, Patient Position: Sitting, Cuff Size: Normal)   Pulse 62   Temp 98.7 F (37.1 C) (Oral)   Ht 5\' 8"  (1.727 m)   Wt 209 lb 9.6 oz (95.1 kg)   SpO2 96%   BMI 31.87 kg/m   Wt Readings from Last 5 Encounters:  07/21/21 209 lb 9.6 oz (95.1 kg)  07/17/21 205 lb (93 kg)  07/14/21 205 lb (93 kg)  07/13/21 205 lb 6 oz (93.2 kg)  06/20/21 208 lb (94.3 kg)     BMI Readings from Last 5 Encounters:  07/21/21 31.87 kg/m  07/17/21 31.17 kg/m  07/14/21 31.17 kg/m  07/13/21 31.23 kg/m  06/20/21 30.72 kg/m     Physical Exam General: Well-appearing, no acute distress Eyes: EOMI, no icterus Neck: Supple: No JVP Pulmonary: Clear, normal work  of breathing Abdomen: Nondistended, bowel sounds present Cardiovascular: Warm, no edema MSK: No synovitis, no joint effusion Neuro: Normal gait, no weakness Psych: Normal mood, full affect   Assessment & Plan:   Chronic cough: Likely multifactorial.  Mild improvement with PPI therapy.  Likely component of GERD.  Ongoing sputum production begs question of chronic bronchitis given daily productive cough for over 2 years as it  relates to history of significant cigarette smoking.  As such, recommend high-dose Breo 1 puff daily.  We will substitute different ICS/LABA therapy if too expensive.  Other considerations include postnasal drip given his description of the same and nasal congestion.  Consider escalation of nasal therapies in the future.  Chest x-ray today for further evaluation.   Return in about 3 months (around 10/21/2021).   Karren Burly, MD 07/21/2021

## 2021-07-21 NOTE — Patient Instructions (Signed)
I think the cough is likely related to a multiple different things.  I am glad it is improved some with the acid medicine.  Given you are producing mucus, I recommend using an inhaler to treat something like chronic bronchitis.  Use Breo 1 puff once a day in the morning.  Rinse her mouth out after every use.  If the cost is too much, do not pick it up at the drugstore and please send me a message to let me know.  We will look for a more cost effective solution.  Continue the Flonase.  We may need to explore more aggressive treatment for the nasal congestion if cough continues to be a problem despite the treatment above.  We will get a chest x-ray today  Feel free to send me a MyChart message in the coming weeks to let me know if the cough is getting better etc.  Return to clinic in 3 months or sooner as needed with Dr. Judeth Horn

## 2021-08-02 DIAGNOSIS — H5213 Myopia, bilateral: Secondary | ICD-10-CM | POA: Diagnosis not present

## 2021-08-03 DIAGNOSIS — H524 Presbyopia: Secondary | ICD-10-CM | POA: Diagnosis not present

## 2021-08-03 NOTE — Telephone Encounter (Signed)
Can we see what most cost effective ICS/LABA is? $45 for Breo. If that is cheapest copay will need to fill out manufacturing assistance.

## 2021-08-03 NOTE — Telephone Encounter (Signed)
Please advise on mychart.  ?

## 2021-08-07 ENCOUNTER — Other Ambulatory Visit: Payer: Self-pay | Admitting: Internal Medicine

## 2021-08-07 ENCOUNTER — Other Ambulatory Visit (HOSPITAL_COMMUNITY): Payer: Self-pay

## 2021-08-07 DIAGNOSIS — R1310 Dysphagia, unspecified: Secondary | ICD-10-CM

## 2021-08-07 DIAGNOSIS — K219 Gastro-esophageal reflux disease without esophagitis: Secondary | ICD-10-CM

## 2021-08-07 DIAGNOSIS — K209 Esophagitis, unspecified without bleeding: Secondary | ICD-10-CM

## 2021-08-07 DIAGNOSIS — K222 Esophageal obstruction: Secondary | ICD-10-CM

## 2021-08-14 ENCOUNTER — Encounter: Payer: Self-pay | Admitting: Internal Medicine

## 2021-08-22 ENCOUNTER — Other Ambulatory Visit: Payer: Self-pay | Admitting: Internal Medicine

## 2021-08-22 DIAGNOSIS — K219 Gastro-esophageal reflux disease without esophagitis: Secondary | ICD-10-CM

## 2021-08-22 DIAGNOSIS — K222 Esophageal obstruction: Secondary | ICD-10-CM

## 2021-08-22 DIAGNOSIS — R1310 Dysphagia, unspecified: Secondary | ICD-10-CM

## 2021-08-22 DIAGNOSIS — K209 Esophagitis, unspecified without bleeding: Secondary | ICD-10-CM

## 2021-08-31 ENCOUNTER — Encounter: Payer: Self-pay | Admitting: Internal Medicine

## 2021-08-31 ENCOUNTER — Ambulatory Visit: Payer: Medicare HMO | Admitting: Internal Medicine

## 2021-08-31 VITALS — BP 114/66 | HR 70 | Ht 68.0 in | Wt 211.5 lb

## 2021-08-31 DIAGNOSIS — K222 Esophageal obstruction: Secondary | ICD-10-CM

## 2021-08-31 DIAGNOSIS — R194 Change in bowel habit: Secondary | ICD-10-CM

## 2021-08-31 DIAGNOSIS — K209 Esophagitis, unspecified without bleeding: Secondary | ICD-10-CM

## 2021-08-31 DIAGNOSIS — R1310 Dysphagia, unspecified: Secondary | ICD-10-CM | POA: Diagnosis not present

## 2021-08-31 DIAGNOSIS — K219 Gastro-esophageal reflux disease without esophagitis: Secondary | ICD-10-CM | POA: Diagnosis not present

## 2021-08-31 MED ORDER — PANTOPRAZOLE SODIUM 40 MG PO TBEC: 40.0000 mg | DELAYED_RELEASE_TABLET | Freq: Every day | ORAL | 3 refills | Status: DC

## 2021-08-31 NOTE — Progress Notes (Signed)
HISTORY OF PRESENT ILLNESS:  Eric Cook is a 76 y.o. male who was evaluated in the office July 13, 2021 regarding a number of GI complaints including chronic active GERD, intermittent dysphagia, abdominal discomfort, postprandial urgency with soft stool.  See that dictation for details.  He was prescribed pantoprazole 40 mg daily.  He subsequently underwent upper endoscopy July 14, 2021.  He was found to have a distal esophageal stricture with associated esophagitis.  Otherwise normal examination.  The esophagus was dilated with a 54 [place] dilator.  He was prescribed pantoprazole 40 mg daily.  He was also prescribed dicyclomine as needed before meals.  He presents today for follow-up as requested.  He is again accompanied by his son.  He is pleased to report that all of his GI symptoms have resolved.  No reflux or dysphagia.  No abdominal pain.  Issues with urgency also improved.  No appreciable medication side effect.  No new complaints.  Regarding colon cancer screening.  They bring with them his most recent Cologuard testing which was performed August 03, 2021.  This was negative  REVIEW OF SYSTEMS:  All non-GI ROS negative unless otherwise stated in the HPI.  Past Medical History:  Diagnosis Date   Anxiety    HLD (hyperlipidemia)    Hyperlipidemia    Hypertension    Peptic ulcer    Seizures (HCC)    medication related    Past Surgical History:  Procedure Laterality Date   LUMBAR DISC SURGERY     L3-L4   UPPER GASTROINTESTINAL ENDOSCOPY      Social History AMDREW OBOYLE  reports that he quit smoking about 2 years ago. His smoking use included cigarettes. He has a 40.00 pack-year smoking history. He has never used smokeless tobacco. He reports that he does not drink alcohol and does not use drugs.  family history includes Colon cancer in his brother; Kidney Stones in his son; Other in his son.  No Known Allergies     PHYSICAL EXAMINATION: Vital signs: BP 114/66    Pulse 70   Ht 5\' 8"  (1.727 m)   Wt 211 lb 8 oz (95.9 kg)   BMI 32.16 kg/m   Constitutional: generally well-appearing, no acute distress Psychiatric: alert and oriented x3, cooperative Eyes: extraocular movements intact, anicteric, conjunctiva pink Mouth: oral pharynx moist, no lesions Neck: supple no lymphadenopathy Cardiovascular: heart regular rate and rhythm, no murmur Lungs: clear to auscultation bilaterally Abdomen: soft, nontender, nondistended, no obvious ascites, no peritoneal signs, normal bowel sounds, no organomegaly Rectal: Omitted Extremities: no clubbing, cyanosis, or lower extremity edema bilaterally Skin: no lesions on visible extremities Neuro: No focal deficits.  Cranial nerves intact  ASSESSMENT:  1.  GERD with esophagitis and peptic stricture.  Asymptomatic post dilation on PPI 2.  Epigastric pain resolved.  Secondary to GERD. 3.  Postprandial urgency with loose stools.  Dicyclomine helps   PLAN:  1.  Reflux precautions 2.  Continue pantoprazole 40 mg daily 3.  Refill pantoprazole for 1 year.  Medication risk reviewed 4.  Refill dicyclomine as needed. 4.  Routine office follow-up 1 year.  Contact the office in the interim for any questions or problems.

## 2021-08-31 NOTE — Patient Instructions (Addendum)
_______________________________________________________  If you are age 75 or older, your body mass index should be between 23-30. Your Body mass index is 32.16 kg/m. If this is out of the aforementioned range listed, please consider follow up with your Primary Care Provider.  If you are age 61 or younger, your body mass index should be between 19-25. Your Body mass index is 32.16 kg/m. If this is out of the aformentioned range listed, please consider follow up with your Primary Care Provider.   ________________________________________________________  The [facility] providers would like to encourage you to use [facility] to communicate with providers for non-urgent requests or questions.  Due to long hold times on the telephone, sending your provider a message by [facility] may be a faster and more efficient way to get a response.  Please allow 48 business hours for a response.  Please remember that this is for non-urgent requests.  _______________________________________________________  We have sent the following medications to your pharmacy for you to pick up at your convenience: Protonix  Please follow up in one year

## 2021-09-07 ENCOUNTER — Other Ambulatory Visit: Payer: Self-pay | Admitting: Internal Medicine

## 2021-09-07 DIAGNOSIS — R1310 Dysphagia, unspecified: Secondary | ICD-10-CM

## 2021-09-07 DIAGNOSIS — K219 Gastro-esophageal reflux disease without esophagitis: Secondary | ICD-10-CM

## 2021-09-07 DIAGNOSIS — K209 Esophagitis, unspecified without bleeding: Secondary | ICD-10-CM

## 2021-09-07 DIAGNOSIS — K222 Esophageal obstruction: Secondary | ICD-10-CM

## 2021-09-27 ENCOUNTER — Other Ambulatory Visit: Payer: Self-pay | Admitting: Family Medicine

## 2021-09-27 DIAGNOSIS — K219 Gastro-esophageal reflux disease without esophagitis: Secondary | ICD-10-CM

## 2021-10-18 ENCOUNTER — Encounter: Payer: Self-pay | Admitting: Family Medicine

## 2021-10-18 ENCOUNTER — Ambulatory Visit: Payer: Medicare HMO | Admitting: Pulmonary Disease

## 2021-10-18 ENCOUNTER — Ambulatory Visit (INDEPENDENT_AMBULATORY_CARE_PROVIDER_SITE_OTHER): Payer: Medicare HMO | Admitting: Family Medicine

## 2021-10-18 VITALS — BP 150/80 | HR 72 | Temp 97.9°F | Ht 68.0 in | Wt 208.2 lb

## 2021-10-18 DIAGNOSIS — Z87891 Personal history of nicotine dependence: Secondary | ICD-10-CM | POA: Diagnosis not present

## 2021-10-18 DIAGNOSIS — E782 Mixed hyperlipidemia: Secondary | ICD-10-CM

## 2021-10-18 DIAGNOSIS — J301 Allergic rhinitis due to pollen: Secondary | ICD-10-CM

## 2021-10-18 DIAGNOSIS — Z0001 Encounter for general adult medical examination with abnormal findings: Secondary | ICD-10-CM

## 2021-10-18 DIAGNOSIS — N401 Enlarged prostate with lower urinary tract symptoms: Secondary | ICD-10-CM | POA: Diagnosis not present

## 2021-10-18 DIAGNOSIS — I10 Essential (primary) hypertension: Secondary | ICD-10-CM

## 2021-10-18 DIAGNOSIS — N138 Other obstructive and reflux uropathy: Secondary | ICD-10-CM | POA: Diagnosis not present

## 2021-10-18 DIAGNOSIS — Z6379 Other stressful life events affecting family and household: Secondary | ICD-10-CM

## 2021-10-18 DIAGNOSIS — Z23 Encounter for immunization: Secondary | ICD-10-CM

## 2021-10-18 DIAGNOSIS — Z0000 Encounter for general adult medical examination without abnormal findings: Secondary | ICD-10-CM

## 2021-10-18 DIAGNOSIS — K219 Gastro-esophageal reflux disease without esophagitis: Secondary | ICD-10-CM | POA: Diagnosis not present

## 2021-10-18 DIAGNOSIS — G40219 Localization-related (focal) (partial) symptomatic epilepsy and epileptic syndromes with complex partial seizures, intractable, without status epilepticus: Secondary | ICD-10-CM

## 2021-10-18 MED ORDER — AMLODIPINE BESYLATE 5 MG PO TABS: 5.0000 mg | ORAL_TABLET | Freq: Every day | ORAL | 3 refills | Status: DC

## 2021-10-18 MED ORDER — TAMSULOSIN HCL 0.4 MG PO CAPS: 0.4000 mg | ORAL_CAPSULE | Freq: Every day | ORAL | 3 refills | Status: DC

## 2021-10-18 MED ORDER — SIMVASTATIN 20 MG PO TABS: 20.0000 mg | ORAL_TABLET | Freq: Every day | ORAL | 3 refills | Status: DC

## 2021-10-18 MED ORDER — LISINOPRIL-HYDROCHLOROTHIAZIDE 10-12.5 MG PO TABS: 1.0000 | ORAL_TABLET | Freq: Every day | ORAL | 3 refills | Status: DC

## 2021-10-18 MED ORDER — FLUTICASONE PROPIONATE 50 MCG/ACT NA SUSP: 2.0000 | Freq: Every day | NASAL | 6 refills | Status: DC

## 2021-10-18 NOTE — Progress Notes (Signed)
Thank you for this update. We previously discussed coming off keppra at recent visit but he was advised possible seizures off medication and he cannot drive during weaning off and for 6 months after completely stopping medication therefore he decided to continue. He should remain on this medication unless further discussed with [place]. Thank you!

## 2021-10-18 NOTE — Patient Instructions (Signed)
Take your blood pressure every morning. 120/60 is  NORMAL blood pressure If it drops BELOW 110/50, call me. This would be TOO LOW.  Preventive Care 60 Years and Older, Male Preventive care refers to lifestyle choices and visits with your health care provider that can promote health and wellness. Preventive care visits are also called wellness exams. What can I expect for my preventive care visit? Counseling During your preventive care visit, your health care provider may ask about your: Medical history, including: Past medical problems. Family medical history. History of falls. Current health, including: Emotional well-being. Home life and relationship well-being. Sexual activity. Memory and ability to understand (cognition). Lifestyle, including: Alcohol, nicotine or tobacco, and drug use. Access to firearms. Diet, exercise, and sleep habits. Work and work Astronomer. Sunscreen use. Safety issues such as seatbelt and bike helmet use. Physical exam Your health care provider will check your: Height and weight. These may be used to calculate your BMI (body mass index). BMI is a measurement that tells if you are at a healthy weight. Waist circumference. This measures the distance around your waistline. This measurement also tells if you are at a healthy weight and may help predict your risk of certain diseases, such as type 2 diabetes and high blood pressure. Heart rate and blood pressure. Body temperature. Skin for abnormal spots. What immunizations do I need?  Vaccines are usually given at various ages, according to a schedule. Your health care provider will recommend vaccines for you based on your age, medical history, and lifestyle or other factors, such as travel or where you work. What tests do I need? Screening Your health care provider may recommend screening tests for certain conditions. This may include: Lipid and cholesterol levels. Diabetes screening. This is done by  checking your blood sugar (glucose) after you have not eaten for a while (fasting). Hepatitis C test. Hepatitis B test. HIV (human immunodeficiency virus) test. STI (sexually transmitted infection) testing, if you are at risk. Lung cancer screening. Colorectal cancer screening. Prostate cancer screening. Abdominal aortic aneurysm (AAA) screening. You may need this if you are a current or former smoker. Talk with your health care provider about your test results, treatment options, and if necessary, the need for more tests. Follow these instructions at home: Eating and drinking  Eat a diet that includes fresh fruits and vegetables, whole grains, lean protein, and low-fat dairy products. Limit your intake of foods with high amounts of sugar, saturated fats, and salt. Take vitamin and mineral supplements as recommended by your health care provider. Do not drink alcohol if your health care provider tells you not to drink. If you drink alcohol: Limit how much you have to 0-2 drinks a day. Know how much alcohol is in your drink. In the [place] one drink equals one 12 oz bottle of beer (355 mL), one 5 oz glass of wine (148 mL), or one 1 oz glass of hard liquor (44 mL). Lifestyle Brush your teeth every morning and night with fluoride toothpaste. Floss one time each day. Exercise for at least 30 minutes 5 or more days each week. Do not use any products that contain nicotine or tobacco. These products include cigarettes, chewing tobacco, and vaping devices, such as e-cigarettes. If you need help quitting, ask your health care provider. Do not use drugs. If you are sexually active, practice safe sex. Use a condom or other form of protection to prevent STIs. Take aspirin only as told by your health care provider. Make sure  that you understand how much to take and what form to take. Work with your health care provider to find out whether it is safe and beneficial for you to take aspirin daily. Ask your  health care provider if you need to take a cholesterol-lowering medicine (statin). Find healthy ways to manage stress, such as: Meditation, yoga, or listening to music. Journaling. Talking to a trusted person. Spending time with friends and family. Safety Always wear your seat belt while driving or riding in a vehicle. Do not drive: If you have been drinking alcohol. Do not ride with someone who has been drinking. When you are tired or distracted. While texting. If you have been using any mind-altering substances or drugs. Wear a helmet and other protective equipment during sports activities. If you have firearms in your house, make sure you follow all gun safety procedures. Minimize exposure to UV radiation to reduce your risk of skin cancer. What's next? Visit your health care provider once a year for an annual wellness visit. Ask your health care provider how often you should have your eyes and teeth checked. Stay up to date on all vaccines. This information is not intended to replace advice given to you by your health care provider. Make sure you discuss any questions you have with your health care provider. Document Revised: 07/06/2020 Document Reviewed: 07/06/2020 Elsevier Patient Education  [place].

## 2021-10-18 NOTE — Progress Notes (Signed)
Eric Cook is a 76 y.o. male presents to office today for annual physical exam examination.    Concerns today include: 1.  Reports a little stress related to both of his sisters being in hospice but he is not sure what they actually have medically going on.  He reports that he has otherwise been doing well but admits that he does not take his medications regularly.  Denies any chest pain, shortness of breath, dizziness or visual disturbance.  Did not take blood pressure medicine today.  Continues to have a cough but admits that he also is not compliant with the Breo that was prescribed by pulmonology.  Has follow-up visit with him soon  Occupation: Retired  Diet: Very low in fiber, high in fat and carbs, Exercise: No structured Last eye exam: Up-to-date Last colonoscopy: Did FIT testing which was negative this year Refills needed today: All Immunizations needed: Flu and shingles vaccine Immunization History  Administered Date(s) Administered   Fluad Quad(high Dose 65+) 10/14/2019, 11/03/2020   Moderna Sars-Covid-2 Vaccination 03/18/2019, 04/15/2019, 12/01/2019   PNEUMOCOCCAL CONJUGATE-20 11/03/2020   Pneumococcal Conjugate-13 10/14/2019     Past Medical History:  Diagnosis Date   Anxiety    HLD (hyperlipidemia)    Hyperlipidemia    Hypertension    Peptic ulcer    Seizures (HCC)    medication related   Social History   Socioeconomic History   Marital status: Single    Spouse name: Not on file   Number of children: 1   Years of education: Not on file   Highest education level: Not on file  Occupational History   Occupation: Lobbyist    Comment: part time  Tobacco Use   Smoking status: Former    Packs/day: 1.00    Years: 40.00    Total pack years: 40.00    Types: Cigarettes    Quit date: 04/2019    Years since quitting: 2.4   Smokeless tobacco: Never  Vaping Use   Vaping Use: Never used  Substance and Sexual Activity   Alcohol use: Never    Drug use: Never   Sexual activity: Never  Other Topics Concern   Not on file  Social History Narrative   Lives alone   Right Handed   Drinks 2-3 cups of caffeine daily   Son in [place]   Social Determinants of Health   Financial Resource Strain: Low Risk  (06/20/2021)   Overall Financial Resource Strain (CARDIA)    Difficulty of Paying Living Expenses: Not hard at all  Food Insecurity: No Food Insecurity (06/20/2021)   Hunger Vital Sign    Worried About Running Out of Food in the Last Year: Never true    Ran Out of Food in the Last Year: Never true  Transportation Needs: No Transportation Needs (06/20/2021)   PRAPARE - Administrator, Civil Service (Medical): No    Lack of Transportation (Non-Medical): No  Physical Activity: Sufficiently Active (06/20/2021)   Exercise Vital Sign    Days of Exercise per Week: 7 days    Minutes of Exercise per Session: 30 min  Stress: No Stress Concern Present (06/20/2021)   [employer] of Occupational Health - Occupational Stress Questionnaire    Feeling of Stress : Only a little  Social Connections: Socially Isolated (06/20/2021)   Social Connection and Isolation Panel [NHANES]    Frequency of Communication with Friends and Family: More than three times a week    Frequency of  Social Gatherings with Friends and Family: Once a week    Attends Religious Services: Never    Marine scientist or Organizations: No    Attends Archivist Meetings: Never    Marital Status: Divorced  Human resources officer Violence: Not At Risk (06/20/2021)   Humiliation, Afraid, Rape, and Kick questionnaire    Fear of Current or Ex-Partner: No    Emotionally Abused: No    Physically Abused: No    Sexually Abused: No   Past Surgical History:  Procedure Laterality Date   LUMBAR DISC SURGERY     L3-L4   UPPER GASTROINTESTINAL ENDOSCOPY     Family History  Problem Relation Age of Onset   Colon cancer Brother    Other Son        vertigo    Kidney Stones Son    Esophageal cancer Neg Hx    Pancreatic cancer Neg Hx    Stomach cancer Neg Hx     Current Outpatient Medications:    cimetidine (TAGAMET) 400 MG tablet, TAKE 1 TABLET TWICE DAILY AS NEEDED, Disp: 60 tablet, Rfl: 0   dicyclomine (BENTYL) 10 MG capsule, TAKE 20 MG (2 CAPSULES) BY MOUTH 15 MINUTES BEFORE MEALS AS NEEDED, Disp: 90 capsule, Rfl: 2   escitalopram (LEXAPRO) 10 MG tablet, TAKE 1 AND 1/2 TABLETS EVERY DAY (Patient not taking: Reported on 08/31/2021), Disp: 135 tablet, Rfl: 3   fluticasone furoate-vilanterol (BREO ELLIPTA) 200-25 MCG/ACT AEPB, Inhale 1 puff into the lungs daily. (Patient not taking: Reported on 08/31/2021), Disp: 60 each, Rfl: 1   levETIRAcetam (KEPPRA XR) 500 MG 24 hr tablet, TAKE 1 TABLET AT BEDTIME, Disp: 90 tablet, Rfl: 2   lisinopril-hydrochlorothiazide (ZESTORETIC) 10-12.5 MG tablet, Take 1 tablet by mouth daily., Disp: 90 tablet, Rfl: 3   meloxicam (MOBIC) 15 MG tablet, Take 15 mg by mouth daily. (Patient not taking: Reported on 08/31/2021), Disp: , Rfl:    pantoprazole (PROTONIX) 40 MG tablet, Take 1 tablet (40 mg total) by mouth daily., Disp: 90 tablet, Rfl: 3   simvastatin (ZOCOR) 20 MG tablet, Take 1 tablet (20 mg total) by mouth daily at 6 PM., Disp: 90 tablet, Rfl: 3   tamsulosin (FLOMAX) 0.4 MG CAPS capsule, Take 1 capsule (0.4 mg total) by mouth daily., Disp: 90 capsule, Rfl: 3  No Known Allergies   ROS: Review of Systems Pertinent items noted in HPI and remainder of comprehensive ROS otherwise negative.    Physical exam BP (!) 150/80   Pulse 72   Temp 97.9 F (36.6 C)   Ht $R'5\' 8"'mi$  (1.727 m)   Wt 208 lb 3.2 oz (94.4 kg)   SpO2 95%   BMI 31.66 kg/m  General appearance: alert, cooperative, appears stated age, and no distress Head: Normocephalic, without obvious abnormality, atraumatic Eyes: negative findings: lids and lashes normal, conjunctivae and sclerae normal, corneas clear, and pupils equal, round, reactive to light and  accomodation Ears: normal TM's and external ear canals both ears Nose: Nares normal. Septum midline. Mucosa normal. No drainage or sinus tenderness. Throat: lips, mucosa, and tongue normal; teeth and gums normal Neck: no adenopathy, supple, symmetrical, trachea midline, and thyroid not enlarged, symmetric, no tenderness/mass/nodules Back: symmetric, no curvature. ROM normal. No CVA tenderness. Lungs: clear to auscultation bilaterally Chest wall: no tenderness Heart: regular rate and rhythm, S1, S2 normal, no murmur, click, rub or gallop Abdomen:  Soft.  Mildly distended/protuberant.  He has mild epigastric tenderness palpation but no rebound or guarding.  No  palpable masses Extremities: extremities normal, atraumatic, no cyanosis or edema Pulses: 2+ and symmetric Skin: Skin color, texture, turgor normal. No rashes or lesions Lymph nodes: Cervical, supraclavicular, and axillary nodes normal. Neurologic: Alert and oriented X 3, normal strength and tone. Normal symmetric reflexes. Normal coordination and gait. Hard of hearing Psych: Mood stable, speech normal  Flowsheet Row Office Visit from 10/18/2021 in [facility]  PHQ-2 Total Score 0      Assessment/ Plan: [name] here for annual physical exam.   Annual physical exam  Essential hypertension - Plan: CMP14+EGFR, amLODipine (NORVASC) 5 MG tablet, DISCONTINUED: lisinopril-hydrochlorothiazide (ZESTORETIC) 10-12.5 MG tablet  Mixed hyperlipidemia - Plan: CMP14+EGFR, Lipid Panel, simvastatin (ZOCOR) 20 MG tablet  Gastroesophageal reflux disease without esophagitis - Plan: CBC  Partial symptomatic epilepsy with complex partial seizures, intractable, without status epilepticus ([place]) - Plan: CBC, Levetiracetam level  Former heavy cigarette smoker (20-39 per day) - Plan: CBC  Benign prostatic hyperplasia with urinary obstruction - Plan: PSA  Stress due to illness of family member  Seasonal allergic rhinitis  due to pollen - Plan: fluticasone (FLONASE) 50 MCG/ACT nasal spray  Influenza vaccination administered.  Declined shingles vaccination  Blood pressure not controlled and this is primarily due to noncompliance with blood pressure medication.  Encourage him to take this regularly discussed risks of cardiovascular events without control blood pressure and cholesterol.  Medication has been renewed.  Given his ongoing cough we will discontinue lisinopril to see if this improves but again I suspect this is due to noncompliance with inhaler that was prescribed by his pulmonologist.  I am replacing with Norvasc 5 mg daily.  Check CMP and recheck blood pressure in 3 months here  Lipid panel ordered.  Continue statin  GERD is moderately controlled with Protonix and Tagamet.  Exam was notable for some mild epigastric tenderness so I will CC his gastroenterologist as FYI  Discussed the importance of compliance with his antiseizure medicine.  He seemed to think that he might be able to just come off of this.  Check Keppra level, CBC.  We will CC to his neurologist  Check PSA  Counseled on healthy lifestyle choices, including diet (rich in fruits, vegetables and lean meats and low in salt and simple carbohydrates) and exercise (at least 30 minutes of moderate physical activity daily).  Patient to follow up in 73m for BP  Arther Heisler M. Lajuana Ripple, DO

## 2021-10-19 ENCOUNTER — Ambulatory Visit: Payer: Medicare HMO | Admitting: Pulmonary Disease

## 2021-10-19 ENCOUNTER — Encounter: Payer: Self-pay | Admitting: Pulmonary Disease

## 2021-10-19 VITALS — BP 126/78 | HR 68 | Ht 68.5 in | Wt 207.4 lb

## 2021-10-19 DIAGNOSIS — R0609 Other forms of dyspnea: Secondary | ICD-10-CM | POA: Diagnosis not present

## 2021-10-19 DIAGNOSIS — R053 Chronic cough: Secondary | ICD-10-CM | POA: Diagnosis not present

## 2021-10-19 LAB — CBC
Hematocrit: 42.3 % (ref 37.5–51.0)
Hemoglobin: 14.5 g/dL (ref 13.0–17.7)
MCH: 32.7 pg (ref 26.6–33.0)
MCHC: 34.3 g/dL (ref 31.5–35.7)
MCV: 95 fL (ref 79–97)
Platelets: 189 10*3/uL (ref 150–450)
RBC: 4.44 x10E6/uL (ref 4.14–5.80)
RDW: 13 % (ref 11.6–15.4)
WBC: 5.6 10*3/uL (ref 3.4–10.8)

## 2021-10-19 LAB — CMP14+EGFR
ALT: 18 IU/L (ref 0–44)
AST: 27 IU/L (ref 0–40)
Albumin/Globulin Ratio: 2.5 — ABNORMAL HIGH (ref 1.2–2.2)
Albumin: 4.8 g/dL (ref 3.8–4.8)
Alkaline Phosphatase: 80 IU/L (ref 44–121)
BUN/Creatinine Ratio: 22 (ref 10–24)
BUN: 25 mg/dL (ref 8–27)
Bilirubin Total: 0.8 mg/dL (ref 0.0–1.2)
CO2: 23 mmol/L (ref 20–29)
Calcium: 9.5 mg/dL (ref 8.6–10.2)
Chloride: 102 mmol/L (ref 96–106)
Creatinine, Ser: 1.14 mg/dL (ref 0.76–1.27)
Globulin, Total: 1.9 g/dL (ref 1.5–4.5)
Glucose: 97 mg/dL (ref 70–99)
Potassium: 4.3 mmol/L (ref 3.5–5.2)
Sodium: 140 mmol/L (ref 134–144)
Total Protein: 6.7 g/dL (ref 6.0–8.5)
eGFR: 67 mL/min/{1.73_m2} (ref >59)

## 2021-10-19 LAB — LIPID PANEL
Chol/HDL Ratio: 5 ratio (ref 0.0–5.0)
Cholesterol, Total: 140 mg/dL (ref 100–199)
HDL: 28 mg/dL — ABNORMAL LOW (ref >39)
LDL Chol Calc (NIH): 87 mg/dL (ref 0–99)
Triglycerides: 137 mg/dL (ref 0–149)
VLDL Cholesterol Cal: 25 mg/dL (ref 5–40)

## 2021-10-19 LAB — LEVETIRACETAM LEVEL: Levetiracetam Lvl: 10 ug/mL (ref 10.0–40.0)

## 2021-10-19 LAB — PSA: Prostate Specific Ag, Serum: 0.1 ng/mL (ref 0.0–4.0)

## 2021-10-19 MED ORDER — FLUTICASONE FUROATE-VILANTEROL 200-25 MCG/ACT IN AEPB: 1.0000 | INHALATION_SPRAY | Freq: Every day | RESPIRATORY_TRACT | 5 refills | Status: DC

## 2021-10-19 NOTE — Addendum Note (Signed)
Addended by: Konrad Felix L on: 10/19/2021 10:49 AM   Modules accepted: Orders

## 2021-10-19 NOTE — Progress Notes (Signed)
@Patient  ID: Eric Cook, male    DOB: November 08, 1945, 77 y.o.   MRN: [id number]  Chief Complaint  Patient presents with   Follow-up    Cough has worsened    Referring provider: Janora Norlander, DO  HPI:   76 y.o. man whom are seen in follow up for evaluation of chronic cough.  Most recent note from PCP reviewed.  Patient returns for routine scheduled follow-up.  Unfortunately, unable to obtain ICS/LABA therapy.  Prescribed Breo at last visit.  Co-pay too high.  Seems like co-pay for all these medications, ICS/LABA or similar amount.  Asked about manufacturer assistance paperwork.  This was never filled out.  He did not want to do what it sounds like at that time.  Now cough is persisted.  Possibly a bit  worse.  Continue to be as productive.  Worse in the mornings.  He is not interested in trying a inhaler and will fill out paperwork for many fraction assistance.  Today, he reports little shortness of breath over the last couple months.  This is a bit newer.  Did not mention at last visit.  Thinks maybe it has been happening gradually over the last several months.  Worse with inclines or stairs.  Better with rest.  No chest pain.  Denies any swelling, orthopnea.  Chest x-ray obtained at the time of last office visit reviewed today and interpreted as chronic bronchitic changes, otherwise clear lungs.  HPI at initial visit: Patient has daily cough.  At least present for 2 years.  On further questioning likely present longer.  Intermittent cough for 20+ years.  Worse in the mornings.  And in the evenings.  Productive of sputum about 40% of the time.  Sometimes has cough and cannot bring up sputum.  He is able to mobilize sputum cough seems to improve.  Recently put on PPI about 2 weeks ago.  This is helped cough about 30%.  Has decreased in frequency and severity of cough a bit.  He continues to have ongoing sputum production.  He does endorse nasal congestion, postnasal drip symptoms.   Uses Flonase daily.  He is a former smoker, 40-pack-year, updated today as was underrepresented in the social history tab.  No seasonal environmental factors he can identify that makes the cough better or worse.  No position makes things better or worse.  No other alleviating or exacerbating factors.  Has not used inhalers.  Reviewed most recent chest x-ray 08/2019 that on my review and interpretation reveals mild hyperinflation, bronchitic changes in the bases.  PMH: Stomach pain, hypertension, GERD, history of tobacco abuse in remission Surgical history: Reviewed, denies any Family history: No significant rest or illness in first relatives Social history: Former smoker, at least 40-pack-year, quit 2020  Questionaires / Pulmonary Flowsheets:   ACT:      No data to display           MMRC:     No data to display           Epworth:      No data to display           Tests:   FENO:  No results found for: "NITRICOXIDE"  PFT:     No data to display           WALK:      No data to display           Imaging: Personally reviewed and as per EMR discussion  this note No results found.  Lab Results: Personally reviewed CBC    Component Value Date/Time   WBC 5.6 10/18/2021 1045   WBC 7.7 08/23/2019 1324   RBC 4.44 10/18/2021 1045   RBC 4.66 08/23/2019 1324   HGB 14.5 10/18/2021 1045   HCT 42.3 10/18/2021 1045   PLT 189 10/18/2021 1045   MCV 95 10/18/2021 1045   MCH 32.7 10/18/2021 1045   MCH 32.6 08/23/2019 1324   MCHC 34.3 10/18/2021 1045   MCHC 35.5 08/23/2019 1324   RDW 13.0 10/18/2021 1045   LYMPHSABS 1.6 09/08/2019 1620   EOSABS 0.1 09/08/2019 1620   BASOSABS 0.0 09/08/2019 1620    BMET    Component Value Date/Time   NA 140 10/18/2021 1045   K 4.3 10/18/2021 1045   CL 102 10/18/2021 1045   CO2 23 10/18/2021 1045   GLUCOSE 97 10/18/2021 1045   GLUCOSE 93 08/25/2019 0443   BUN 25 10/18/2021 1045   CREATININE 1.14 10/18/2021 1045    CALCIUM 9.5 10/18/2021 1045   GFRNONAA 67 09/08/2019 1620   GFRAA 77 09/08/2019 1620    BNP    Component Value Date/Time   BNP 129.0 (H) 08/23/2019 1324    ProBNP No results found for: "PROBNP"  Specialty Problems   None   No Known Allergies  Immunization History  Administered Date(s) Administered   Fluad Quad(high Dose 65+) 10/14/2019, 11/03/2020, 10/18/2021   Moderna Sars-Covid-2 Vaccination 03/18/2019, 04/15/2019, 12/01/2019   PNEUMOCOCCAL CONJUGATE-20 11/03/2020   Pneumococcal Conjugate-13 10/14/2019    Past Medical History:  Diagnosis Date   Anxiety    HLD (hyperlipidemia)    Hyperlipidemia    Hypertension    Peptic ulcer    Seizures (HCC)    medication related    Tobacco History: Social History   Tobacco Use  Smoking Status Former   Packs/day: 1.00   Years: 40.00   Total pack years: 40.00   Types: Cigarettes   Quit date: 04/2019   Years since quitting: 2.4  Smokeless Tobacco Never   Counseling given: Not Answered   Continue to not smoke  Outpatient Encounter Medications as of 10/19/2021  Medication Sig   amLODipine (NORVASC) 5 MG tablet Take 1 tablet (5 mg total) by mouth daily. STOP lisinopril/ HCTZ   cimetidine (TAGAMET) 400 MG tablet TAKE 1 TABLET TWICE DAILY AS NEEDED   dicyclomine (BENTYL) 10 MG capsule TAKE 20 MG (2 CAPSULES) BY MOUTH 15 MINUTES BEFORE MEALS AS NEEDED   fluticasone (FLONASE) 50 MCG/ACT nasal spray Place 2 sprays into both nostrils daily.   levETIRAcetam (KEPPRA XR) 500 MG 24 hr tablet TAKE 1 TABLET AT BEDTIME   pantoprazole (PROTONIX) 40 MG tablet Take 1 tablet (40 mg total) by mouth daily.   simvastatin (ZOCOR) 20 MG tablet Take 1 tablet (20 mg total) by mouth daily at 6 PM.   tamsulosin (FLOMAX) 0.4 MG CAPS capsule Take 1 capsule (0.4 mg total) by mouth daily.   No facility-administered encounter medications on file as of 10/19/2021.     Review of Systems  Review of Systems  N/a Physical Exam  BP 126/78 (BP  Location: Left Arm, Cuff Size: Normal)   Pulse 68   Ht 5' 8.5" (1.74 m)   Wt 207 lb 6.4 oz (94.1 kg)   SpO2 97%   BMI 31.08 kg/m   Wt Readings from Last 5 Encounters:  10/19/21 207 lb 6.4 oz (94.1 kg)  10/18/21 208 lb 3.2 oz (94.4 kg)  08/31/21 211 lb  8 oz (95.9 kg)  07/21/21 209 lb 9.6 oz (95.1 kg)  07/17/21 205 lb (93 kg)    BMI Readings from Last 5 Encounters:  10/19/21 31.08 kg/m  10/18/21 31.66 kg/m  08/31/21 32.16 kg/m  07/21/21 31.87 kg/m  07/17/21 31.17 kg/m     Physical Exam General: Well-appearing, no acute distress Eyes: EOMI, no icterus Neck: Supple: No JVP Pulmonary: Clear, normal work of breathing Abdomen: Nondistended, bowel sounds present Cardiovascular: Warm, no edema MSK: No synovitis, no joint effusion Neuro: Normal gait, no weakness Psych: Normal mood, full affect   Assessment & Plan:   Chronic cough: Likely multifactorial.  Mild improvement with PPI therapy.  Likely component of GERD.  Ongoing sputum production begs question of chronic bronchitis given daily productive cough for over 2 years as it  relates to history of significant cigarette smoking.  As such, recommend high-dose Breo 1 puff daily.  Unfortunately, this was never obtained due to too high of a co-pay.  Offered manufacturing assistance in July but he declined this.  He agrees to pursue possible 25 transfers today, paperwork provided.  If not improving with ICS/LABA therapy, would pursue more aggressive intranasal regimen.  Currently only using Flonase as needed.  Sounds like he does not use it very often.  Chest x-ray 06/2021 in the setting of ongoing symptoms without acute findings, chronic bronchitic changes on my interpretation.  Dyspnea on exertion: New complaint today.  PFTs for further evaluation.  Assess response to ICS/LABA therapy.   Return in about 2 months (around 12/19/2021).   Lanier Clam, MD 10/19/2021

## 2021-10-19 NOTE — Patient Instructions (Signed)
Nice to see you again  I am sorry about the cost of the inhaler  We will fill out paperwork for manufacturing assistance for [facility].  We will see if they will help cover some of the cost of the medication.  Once we obtain this medication, it is 1 puff once a day in the morning.  Rinse your mouth with water after every use.  If this does not work, we will continue to look for a solution so we can try an inhaler to both help with the cough but also some your shortness of breath.  To help better understand or diagnose the shortness of breath, I recommend pulmonary function test.  I have ordered these.  We will try to get them scheduled for you the same day you see me in a couple of months.  We can talk about the results right after they are done.  Return to clinic in 2 months with Dr. Silas Flood with pulmonary function test same day prior to appointment

## 2021-10-25 ENCOUNTER — Other Ambulatory Visit: Payer: Self-pay

## 2021-10-25 DIAGNOSIS — R1310 Dysphagia, unspecified: Secondary | ICD-10-CM

## 2021-10-25 DIAGNOSIS — K209 Esophagitis, unspecified without bleeding: Secondary | ICD-10-CM

## 2021-10-25 DIAGNOSIS — K219 Gastro-esophageal reflux disease without esophagitis: Secondary | ICD-10-CM

## 2021-10-25 DIAGNOSIS — K222 Esophageal obstruction: Secondary | ICD-10-CM

## 2021-10-25 MED ORDER — DICYCLOMINE HCL 10 MG PO CAPS: ORAL_CAPSULE | ORAL | 2 refills | Status: DC

## 2021-11-20 ENCOUNTER — Other Ambulatory Visit: Payer: Self-pay | Admitting: Family Medicine

## 2021-11-20 DIAGNOSIS — K219 Gastro-esophageal reflux disease without esophagitis: Secondary | ICD-10-CM

## 2021-12-11 ENCOUNTER — Other Ambulatory Visit: Payer: Self-pay | Admitting: Internal Medicine

## 2021-12-11 DIAGNOSIS — K222 Esophageal obstruction: Secondary | ICD-10-CM

## 2021-12-11 DIAGNOSIS — K209 Esophagitis, unspecified without bleeding: Secondary | ICD-10-CM

## 2021-12-11 DIAGNOSIS — R1310 Dysphagia, unspecified: Secondary | ICD-10-CM

## 2021-12-11 DIAGNOSIS — K219 Gastro-esophageal reflux disease without esophagitis: Secondary | ICD-10-CM

## 2021-12-13 ENCOUNTER — Other Ambulatory Visit: Payer: Self-pay | Admitting: Family Medicine

## 2021-12-13 NOTE — Telephone Encounter (Signed)
  The original prescription was discontinued on 10/18/2021 by Raliegh Ip, DO. Renewing this prescription may not be appropriate.

## 2021-12-15 ENCOUNTER — Other Ambulatory Visit: Payer: Self-pay | Admitting: Internal Medicine

## 2021-12-15 DIAGNOSIS — K222 Esophageal obstruction: Secondary | ICD-10-CM

## 2021-12-15 DIAGNOSIS — K209 Esophagitis, unspecified without bleeding: Secondary | ICD-10-CM

## 2021-12-15 DIAGNOSIS — K219 Gastro-esophageal reflux disease without esophagitis: Secondary | ICD-10-CM

## 2021-12-15 DIAGNOSIS — R1310 Dysphagia, unspecified: Secondary | ICD-10-CM

## 2021-12-19 ENCOUNTER — Ambulatory Visit (INDEPENDENT_AMBULATORY_CARE_PROVIDER_SITE_OTHER): Payer: Medicare HMO | Admitting: Pulmonary Disease

## 2021-12-19 ENCOUNTER — Ambulatory Visit: Payer: Medicare HMO | Admitting: Pulmonary Disease

## 2021-12-19 ENCOUNTER — Encounter: Payer: Self-pay | Admitting: Pulmonary Disease

## 2021-12-19 VITALS — BP 120/66 | HR 81 | Ht 68.5 in | Wt 207.0 lb

## 2021-12-19 DIAGNOSIS — R0609 Other forms of dyspnea: Secondary | ICD-10-CM | POA: Diagnosis not present

## 2021-12-19 DIAGNOSIS — M5442 Lumbago with sciatica, left side: Secondary | ICD-10-CM | POA: Diagnosis not present

## 2021-12-19 DIAGNOSIS — M5441 Lumbago with sciatica, right side: Secondary | ICD-10-CM | POA: Diagnosis not present

## 2021-12-19 LAB — PULMONARY FUNCTION TEST
DL/VA % pred: 74 %
DL/VA: 2.97 ml/min/mmHg/L
DLCO cor % pred: 69 %
DLCO cor: 16.73 ml/min/mmHg
DLCO unc % pred: 69 %
DLCO unc: 16.73 ml/min/mmHg
FEF 25-75 Post: 1.73 L/sec
FEF 25-75 Pre: 1.44 L/sec
FEF2575-%Change-Post: 19 %
FEF2575-%Pred-Post: 82 %
FEF2575-%Pred-Pre: 69 %
FEV1-%Change-Post: 3 %
FEV1-%Pred-Post: 81 %
FEV1-%Pred-Pre: 78 %
FEV1-Post: 2.33 L
FEV1-Pre: 2.26 L
FEV1FVC-%Change-Post: 0 %
FEV1FVC-%Pred-Pre: 99 %
FEV6-%Change-Post: 3 %
FEV6-%Pred-Post: 85 %
FEV6-%Pred-Pre: 83 %
FEV6-Post: 3.2 L
FEV6-Pre: 3.1 L
FEV6FVC-%Change-Post: 0 %
FEV6FVC-%Pred-Post: 104 %
FEV6FVC-%Pred-Pre: 105 %
FVC-%Change-Post: 3 %
FVC-%Pred-Post: 81 %
FVC-%Pred-Pre: 78 %
FVC-Post: 3.25 L
FVC-Pre: 3.14 L
Post FEV1/FVC ratio: 72 %
Post FEV6/FVC ratio: 98 %
Pre FEV1/FVC ratio: 72 %
Pre FEV6/FVC Ratio: 99 %

## 2021-12-19 NOTE — Progress Notes (Signed)
PFT done today. 

## 2021-12-19 NOTE — Patient Instructions (Addendum)
Nice to see you again  Your pulmonary function test today are essentially normal  I will send a message about the CT scan that your PCP ordered back in June.  See why this has not been scheduled  I sent a referral to physical therapy for the low back pain.  There is a [facility] outpatient rehabilitation center in [place].  I sent a referral there.  You should get a call in the coming days to schedule appointment.  If we can get the [facility] then I think it is worth trying for a month or 2.  If it is helpful, let me know we can schedule follow-up to keep prescriptions up-to-date.  If it is not helpful, please let me know and then we can just follow-up as needed.  We will resubmit the application, updated stating the supplemental drug coverage that you have.  Please send me a message if you can with information on the denial letter.  Return to clinic as needed pending response to [facility]

## 2021-12-19 NOTE — Progress Notes (Signed)
@Patient  ID: , male    DOB: [birth year], 76 y.o.   MRN: [id number]  Chief Complaint  Patient presents with   Follow-up    Pt is here for follow up for DOE. Pt states that he is not taking the Breo. Got a message about paperwork being wrong. Nurse will look. Pt had full pfts done today.     Referring provider: [id number], DO  HPI:   76 y.o. man whom are seen in follow up for evaluation of chronic cough.  Most recent note from PCP reviewed.  Cough is ok. Does not seem to be much of an issue today. Last visit complained of DOE.  He further clarifies today.  More like when he is bending over he becomes short of breath.  Unable to get Breo.  States that many fraction assistance was denied due to lack informational for him.  He does not have denial letter.  We reviewed the form, was faxed 10/20/2021 with confirmation.  There are some missing portions, he did not indicate he had supplemental medication insurance/pharmacy benefits.  PFTs performed day.  Discussed in detail below, largely normal with moderately reduced DLCO.  This is good news.  We discussed trial of Breo if we can obtain it.  HPI at initial visit: Patient has daily cough.  At least present for 2 years.  On further questioning likely present longer.  Intermittent cough for 20+ years.  Worse in the mornings.  And in the evenings.  Productive of sputum about 40% of the time.  Sometimes has cough and cannot bring up sputum.  He is able to mobilize sputum cough seems to improve.  Recently put on PPI about 2 weeks ago.  This is helped cough about 30%.  Has decreased in frequency and severity of cough a bit.  He continues to have ongoing sputum production.  He does endorse nasal congestion, postnasal drip symptoms.  Uses Flonase daily.  He is a former smoker, 40-pack-year, updated today as was underrepresented in the social history tab.  No seasonal environmental factors he can identify that makes the cough better or  worse.  No position makes things better or worse.  No other alleviating or exacerbating factors.  Has not used inhalers.  Reviewed most recent chest x-ray 08/2019 that on my review and interpretation reveals mild hyperinflation, bronchitic changes in the bases.  PMH: Stomach pain, hypertension, GERD, history of tobacco abuse in remission Surgical history: Reviewed, denies any Family history: No significant rest or illness in first relatives Social history: Former smoker, at least 40-pack-year, quit 2020  Questionaires / Pulmonary Flowsheets:   ACT:      No data to display          MMRC:     No data to display          Epworth:      No data to display          Tests:   FENO:  No results found for: "NITRICOXIDE"  PFT:    Latest Ref Rng & Units 12/19/2021    9:44 AM  PFT Results  FVC-Pre L 3.14  P  FVC-Predicted Pre % 78  P  FVC-Post L 3.25  P  FVC-Predicted Post % 81  P  Pre FEV1/FVC % % 72  P  Post FEV1/FCV % % 72  P  FEV1-Pre L 2.26  P  FEV1-Predicted Pre % 78  P  FEV1-Post L 2.33  P  DLCO  uncorrected ml/min/mmHg 16.73  P  DLCO UNC% % 69  P  DLCO corrected ml/min/mmHg 16.73  P  DLCO COR %Predicted % 69  P  DLVA Predicted % 74  P    P Preliminary result  Personally reviewed and interpreted as normal spirometry, no bronchodilator response.  Lung volumes are normal limits DLCO moderately reduced.  WALK:      No data to display          Imaging: Personally reviewed and as per EMR discussion this note No results found.  Lab Results: Personally reviewed CBC    Component Value Date/Time   WBC 5.6 10/18/2021 1045   WBC 7.7 08/23/2019 1324   RBC 4.44 10/18/2021 1045   RBC 4.66 08/23/2019 1324   HGB 14.5 10/18/2021 1045   HCT 42.3 10/18/2021 1045   PLT 189 10/18/2021 1045   MCV 95 10/18/2021 1045   MCH 32.7 10/18/2021 1045   MCH 32.6 08/23/2019 1324   MCHC 34.3 10/18/2021 1045   MCHC 35.5 08/23/2019 1324   RDW 13.0 10/18/2021 1045    LYMPHSABS 1.6 09/08/2019 1620   EOSABS 0.1 09/08/2019 1620   BASOSABS 0.0 09/08/2019 1620    BMET    Component Value Date/Time   NA 140 10/18/2021 1045   K 4.3 10/18/2021 1045   CL 102 10/18/2021 1045   CO2 23 10/18/2021 1045   GLUCOSE 97 10/18/2021 1045   GLUCOSE 93 08/25/2019 0443   BUN 25 10/18/2021 1045   CREATININE 1.14 10/18/2021 1045   CALCIUM 9.5 10/18/2021 1045   GFRNONAA 67 09/08/2019 1620   GFRAA 77 09/08/2019 1620    BNP    Component Value Date/Time   BNP 129.0 (H) 08/23/2019 1324    ProBNP No results found for: "PROBNP"  Specialty Problems   None   No Known Allergies  Immunization History  Administered Date(s) Administered   Fluad Quad(high Dose 75+) 10/14/2019, 11/03/2020, 10/18/2021   Moderna Sars-Covid-2 Vaccination 03/18/2019, 04/15/2019, 12/01/2019   PNEUMOCOCCAL CONJUGATE-20 11/03/2020   Pneumococcal Conjugate-13 10/14/2019    Past Medical History:  Diagnosis Date   Anxiety    HLD (hyperlipidemia)    Hyperlipidemia    Hypertension    Peptic ulcer    Seizures (HCC)    medication related    Tobacco History: Social History   Tobacco Use  Smoking Status Former   Packs/day: 1.00   Years: 40.00   Total pack years: 40.00   Types: Cigarettes   Quit date: 04/2019   Years since quitting: 2.6  Smokeless Tobacco Never   Counseling given: Not Answered   Continue to not smoke  Outpatient Encounter Medications as of 12/19/2021  Medication Sig   amLODipine (NORVASC) 5 MG tablet Take 1 tablet (5 mg total) by mouth daily. STOP lisinopril/ HCTZ   cimetidine (TAGAMET) 400 MG tablet TAKE 1 TABLET TWICE DAILY AS NEEDED   dicyclomine (BENTYL) 10 MG capsule TAKE 20 MG (2 CAPSULES) 15 MINUTES BEFORE MEALS AS NEEDED   fluticasone (FLONASE) 50 MCG/ACT nasal spray Place 2 sprays into both nostrils daily.   levETIRAcetam (KEPPRA XR) 500 MG 24 hr tablet TAKE 1 TABLET AT BEDTIME   pantoprazole (PROTONIX) 40 MG tablet Take 1 tablet (40 mg total) by  mouth daily.   simvastatin (ZOCOR) 20 MG tablet Take 1 tablet (20 mg total) by mouth daily at 6 PM.   tamsulosin (FLOMAX) 0.4 MG CAPS capsule Take 1 capsule (0.4 mg total) by mouth daily.   fluticasone furoate-vilanterol (BREO ELLIPTA) 200-25  MCG/ACT AEPB Inhale 1 puff into the lungs daily. (Patient not taking: Reported on 12/19/2021)   [DISCONTINUED] dicyclomine (BENTYL) 10 MG capsule TAKE 20 MG BY MOUTH 15 MINUTES BEFORE MEALS AS NEEDED   No facility-administered encounter medications on file as of 12/19/2021.     Review of Systems  Review of Systems  N/a Physical Exam  BP 120/66 (BP Location: Left Arm, Patient Position: Sitting, Cuff Size: Normal)   Pulse 81   Ht 5' 8.5" (1.74 m)   Wt 207 lb (93.9 kg)   SpO2 94%   BMI 31.02 kg/m   Wt Readings from Last 5 Encounters:  12/19/21 207 lb (93.9 kg)  10/19/21 207 lb 6.4 oz (94.1 kg)  10/18/21 208 lb 3.2 oz (94.4 kg)  08/31/21 211 lb 8 oz (95.9 kg)  07/21/21 209 lb 9.6 oz (95.1 kg)    BMI Readings from Last 5 Encounters:  12/19/21 31.02 kg/m  10/19/21 31.08 kg/m  10/18/21 31.66 kg/m  08/31/21 32.16 kg/m  07/21/21 31.87 kg/m     Physical Exam General: Well-appearing, no acute distress Eyes: EOMI, no icterus Neck: Supple: No JVP Pulmonary: Clear, normal work of breathing Abdomen: Nondistended, bowel sounds present Cardiovascular: Warm, no edema MSK: No synovitis, no joint effusion Neuro: Normal gait, no weakness Psych: Normal mood, full affect   Assessment & Plan:   Chronic cough: Likely multifactorial.  Mild improvement with PPI therapy.  Likely component of GERD.  Ongoing sputum production begs question of chronic bronchitis given daily productive cough for over 2 years as it  relates to history of significant cigarette smoking.  Try to obtain Breo 1 puff daily.  Will resend assistance paperwork.  Assess response.  Dyspnea on exertion: Mild.  PFTs largely normal.  Breo as above.  If not helpful for cough or  DOE can stop.  Return if symptoms worsen or fail to improve.   Karren Burly, MD 12/19/2021

## 2021-12-25 ENCOUNTER — Ambulatory Visit: Payer: Medicare HMO | Attending: Pulmonary Disease | Admitting: Physical Therapy

## 2021-12-25 ENCOUNTER — Encounter: Payer: Self-pay | Admitting: Physical Therapy

## 2021-12-25 ENCOUNTER — Other Ambulatory Visit: Payer: Self-pay

## 2021-12-25 DIAGNOSIS — M5441 Lumbago with sciatica, right side: Secondary | ICD-10-CM | POA: Diagnosis not present

## 2021-12-25 DIAGNOSIS — M5459 Other low back pain: Secondary | ICD-10-CM | POA: Insufficient documentation

## 2021-12-25 DIAGNOSIS — M5442 Lumbago with sciatica, left side: Secondary | ICD-10-CM | POA: Insufficient documentation

## 2021-12-25 DIAGNOSIS — M62830 Muscle spasm of back: Secondary | ICD-10-CM | POA: Insufficient documentation

## 2021-12-25 NOTE — Therapy (Signed)
OUTPATIENT PHYSICAL THERAPY THORACOLUMBAR EVALUATION   Patient Name: Eric Cook MRN: [id number] DOB:[birth year], 76 y.o., male Today's Date: 12/25/2021  END OF SESSION:  PT End of Session - 12/25/21 1319     Visit Number 1    Number of Visits 12    Date for PT Re-Evaluation 02/05/22    Authorization Type FOTO AT LEAST EVERY 5TH VISIT.  PROGRESS NOTE AT 10TH VISIT.  KX MODIFIER AFTER 15 VISITS.    PT Start Time [id number]    PT Stop Time 1345    PT Time Calculation (min) 47 min    Activity Tolerance Patient tolerated treatment well    Behavior During Therapy WFL for tasks assessed/performed             Past Medical History:  Diagnosis Date   Anxiety    HLD (hyperlipidemia)    Hyperlipidemia    Hypertension    Peptic ulcer    Seizures ([place])    medication related   Past Surgical History:  Procedure Laterality Date   LUMBAR DISC SURGERY     L3-L4   UPPER GASTROINTESTINAL ENDOSCOPY     Patient Active Problem List   Diagnosis Date Noted   Partial symptomatic epilepsy with complex partial seizures, intractable, without status epilepticus ([place]) 12/01/2019   Urine frequency 11/09/2019   Diarrhea 11/09/2019   Closed compression fracture of L3 lumbar vertebra, initial encounter ([place]) 123456   Acute metabolic encephalopathy [id number]   HTN (hypertension) 08/23/2019   BPH (benign prostatic hyperplasia) 08/23/2019   GAD (generalized anxiety disorder) 08/23/2019   Acute confusional state 08/23/2019    REFERRING PROVIDER: Rodman Key Hunsucker  REFERRING DIAG: Low back pain due to bilateral sciatica.  Rationale for Evaluation and Treatment: Rehabilitation  THERAPY DIAG:  Other low back pain  Muscle spasm of back  ONSET DATE: Ongoing but an exacerbation this summer.  SUBJECTIVE:                                                                                                                                                                                            SUBJECTIVE STATEMENT: The patient presents to the clinic with a long h/o of low back pain with a severe exacerbation this summer that resulted in an ED visit.His pain at rest is low today but can rise to much higher levels especially with sitting and certain movements.  His CC is pain bilateral in his lower lumbar region.  Walking helps decrease his pain.    PERTINENT HISTORY:  HTN, previous lumbar surgery.  PAIN:  Are you having pain? Yes: NPRS scale: 4/10 Pain location: Bilateral lower lumbar region.  Pain description: Ache, throb. Aggravating factors: Sitting. Relieving factors: Walking.  PRECAUTIONS: None  WEIGHT BEARING RESTRICTIONS: No  FALLS:  Has patient fallen in last 6 months? No  LIVING ENVIRONMENT: Lives in: House/apartment Has following equipment at home: None  OCCUPATION: Retired.  PLOF: Independent  PATIENT GOALS: Decrease pain, sit longer.  NEXT MD VISIT:   OBJECTIVE:   PATIENT SURVEYS:  FOTO Complete.  POSTURE: rounded shoulders, forward head, and decreased lumbar lordosis  PALPATION: Tender to palpation over bilateral upper gluteal musculature.  LUMBAR ROM:   Full active lumbar flexion and extension to 10 degrees.  LOWER EXTREMITY MMT:   Normal LE strength.  LUMBAR SPECIAL TESTS:  Straight leg raise test: Negative and FABER test: Negative.  Equal leg lengths.  Normal bilateral Patellar reflexes.  Absent Achilles reflexes.  GAIT: Slow and purposeful.  Transitory movements (sit to stand, sit to supine to sit) performed slowly and painfully.  TODAY'S TREATMENT:                                                                                                                              DATE: HMP and IFC to patient's bilateral lower lumbar region x 20 minutes.   Patient tolerated treatment without complaint with normal modality response following removal of modality.   ASSESSMENT:  CLINICAL IMPRESSION: The patient presents to OPPT with c/o  bilateral low back pain that has been ongoing for many years.  He had a severe exacerbation over the Summer.  He was found to be tender to palpation over bilateral upper gluteal musculature.  Special testing is negative.  His functional mobility is impaired and he can only sit for minimal amounts of time.  He exhibits normal LE strength and active lumbar flexion is full.  He has limited lumbar extension.  Patient will benefit from skilled physical therapy intervention to address pain and deficits. OBJECTIVE IMPAIRMENTS: Abnormal gait, decreased activity tolerance, decreased ROM, increased muscle spasms, and pain.   ACTIVITY LIMITATIONS: lifting and sitting  PARTICIPATION LIMITATIONS: yard work  PERSONAL FACTORS: Time since onset of injury/illness/exacerbation are also affecting patient's functional outcome.   REHAB POTENTIAL: Good  CLINICAL DECISION MAKING: Stable/uncomplicated  EVALUATION COMPLEXITY: Low   GOALS: Goals reviewed with patient? Yes  SHORT TERM GOALS: Target date: 01/18/22  Ind with HEP. Baseline: Goal status: INITIAL   LONG TERM GOALS: Target date: 02/05/22  Sit 20 minutes with pain not > 3-4/10. Baseline:  Goal status: INITIAL  2.  Perform ADL's with pain not > 3-4/10. Baseline:  Goal status: INITIAL  3.  Perform transitory movements with pain not > 3-4/10. Baseline:  Goal status: INITIAL  PLAN:  PT FREQUENCY: 2x/week  PT DURATION: 6 weeks  PLANNED INTERVENTIONS: Therapeutic exercises, Therapeutic activity, Patient/Family education, Self Care, Dry Needling, Electrical stimulation, Cryotherapy, Moist heat, Ultrasound, and Manual therapy.  PLAN FOR NEXT SESSION: Combo e'stim/US, STW/M, core exercise progression.   Jahvon Gosline, [place], PT 12/25/2021, 2:12 PM

## 2021-12-28 ENCOUNTER — Ambulatory Visit: Payer: Medicare HMO

## 2021-12-28 DIAGNOSIS — M5441 Lumbago with sciatica, right side: Secondary | ICD-10-CM | POA: Diagnosis not present

## 2021-12-28 DIAGNOSIS — M5459 Other low back pain: Secondary | ICD-10-CM | POA: Diagnosis not present

## 2021-12-28 DIAGNOSIS — M5442 Lumbago with sciatica, left side: Secondary | ICD-10-CM | POA: Diagnosis not present

## 2021-12-28 DIAGNOSIS — M62830 Muscle spasm of back: Secondary | ICD-10-CM

## 2021-12-28 NOTE — Therapy (Signed)
OUTPATIENT PHYSICAL THERAPY THORACOLUMBAR TREATMENT   Patient Name: Eric Cook MRN: [id number] DOB:1945/03/31, 76 y.o., male Today's Date: 12/28/2021  END OF SESSION:  PT End of Session - 12/28/21 0958     Visit Number 2    Number of Visits 12    Date for PT Re-Evaluation 02/05/22    Authorization Type FOTO AT LEAST EVERY 5TH VISIT.  PROGRESS NOTE AT 10TH VISIT.  KX MODIFIER AFTER 15 VISITS.    PT Start Time 0945    PT Stop Time 1044    PT Time Calculation (min) 59 min    Activity Tolerance Patient tolerated treatment well    Behavior During Therapy WFL for tasks assessed/performed             Past Medical History:  Diagnosis Date   Anxiety    HLD (hyperlipidemia)    Hyperlipidemia    Hypertension    Peptic ulcer    Seizures (HCC)    medication related   Past Surgical History:  Procedure Laterality Date   LUMBAR DISC SURGERY     L3-L4   UPPER GASTROINTESTINAL ENDOSCOPY     Patient Active Problem List   Diagnosis Date Noted   Partial symptomatic epilepsy with complex partial seizures, intractable, without status epilepticus ([facility]CC) 12/01/2019   Urine frequency 11/09/2019   Diarrhea 11/09/2019   Closed compression fracture of L3 lumbar vertebra, initial encounter ([facility]) 08/25/2019   Acute metabolic encephalopathy 08/24/2019   HTN (hypertension) 08/23/2019   BPH (benign prostatic hyperplasia) 08/23/2019   GAD (generalized anxiety disorder) 08/23/2019   Acute confusional state 08/23/2019    REFERRING PROVIDER: Molli Hazard Hunsucker  REFERRING DIAG: Low back pain due to bilateral sciatica.  Rationale for Evaluation and Treatment: Rehabilitation  THERAPY DIAG:  Other low back pain  Muscle spasm of back  ONSET DATE: Ongoing but an exacerbation this summer.  SUBJECTIVE:                                                                                                                                                                                            SUBJECTIVE STATEMENT: Pt arrives for today's treatment session reporting 5/10 low back pain.    PERTINENT HISTORY:  HTN, previous lumbar surgery.  PAIN:  Are you having pain? Yes: NPRS scale: 4/10 Pain location: Bilateral lower lumbar region. Pain description: Ache, throb. Aggravating factors: Sitting. Relieving factors: Walking.  PRECAUTIONS: None  WEIGHT BEARING RESTRICTIONS: No  FALLS:  Has patient fallen in last 6 months? No  LIVING ENVIRONMENT: Lives in: House/apartment Has following equipment at home: None  OCCUPATION: Retired.  PLOF: Independent  PATIENT GOALS: Decrease  pain, sit longer.  NEXT MD VISIT:   OBJECTIVE:   PATIENT SURVEYS:  FOTO Complete.  POSTURE: rounded shoulders, forward head, and decreased lumbar lordosis  PALPATION: Tender to palpation over bilateral upper gluteal musculature.  LUMBAR ROM:   Full active lumbar flexion and extension to 10 degrees.  LOWER EXTREMITY MMT:   Normal LE strength.  LUMBAR SPECIAL TESTS:  Straight leg raise test: Negative and FABER test: Negative.  Equal leg lengths.  Normal bilateral Patellar reflexes.  Absent Achilles reflexes.  GAIT: Slow and purposeful.  Transitory movements (sit to stand, sit to supine to sit) performed slowly and painfully.  TODAY'S TREATMENT:                                                                                                                              DATE:                                     EXERCISE LOG  Exercise Repetitions and Resistance Comments  Nustep Lvl 3 x 15 mins                    Blank cell = exercise not performed today   Manual Therapy Soft Tissue Mobilization: lumbar , STW/M to bil lumbar paraspinals, QL, and upper glutes to decrease pain and tone.  Pt positioned in left side-lying with pillow between his knees   ASSESSMENT:  CLINICAL IMPRESSION: Pt arrives for today's treatment session reporting 5/10 low back pain.  Pt able to tolerate  introduction to Nustep for warm up today with minimal discomfort.  STW/M performed to bil lumbar paraspinals, QL, and upper glutes to decrease pain and tone.  Pt given tennis ball to perform STW at home.  Normal responses to estim and MH noted upon removal.  Pt reported 3/10 low back pain at completion of today's treatment session.   OBJECTIVE IMPAIRMENTS: Abnormal gait, decreased activity tolerance, decreased ROM, increased muscle spasms, and pain.   ACTIVITY LIMITATIONS: lifting and sitting  PARTICIPATION LIMITATIONS: yard work  PERSONAL FACTORS: Time since onset of injury/illness/exacerbation are also affecting patient's functional outcome.   REHAB POTENTIAL: Good  CLINICAL DECISION MAKING: Stable/uncomplicated  EVALUATION COMPLEXITY: Low   GOALS: Goals reviewed with patient? Yes  SHORT TERM GOALS: Target date: 01/18/22  Ind with HEP. Baseline: Goal status: INITIAL   LONG TERM GOALS: Target date: 02/05/22  Sit 20 minutes with pain not > 3-4/10. Baseline:  Goal status: INITIAL  2.  Perform ADL's with pain not > 3-4/10. Baseline:  Goal status: INITIAL  3.  Perform transitory movements with pain not > 3-4/10. Baseline:  Goal status: INITIAL  PLAN:  PT FREQUENCY: 2x/week  PT DURATION: 6 weeks  PLANNED INTERVENTIONS: Therapeutic exercises, Therapeutic activity, Patient/Family education, Self Care, Dry Needling, Electrical stimulation, Cryotherapy, Moist heat, Ultrasound, and Manual therapy.  PLAN FOR NEXT SESSION: Combo  e'stim/US, STW/M, core exercise progression.   Newman Pies, PTA 12/28/2021, 10:48 AM

## 2022-01-02 ENCOUNTER — Ambulatory Visit: Payer: Medicare HMO | Admitting: Physical Therapy

## 2022-01-02 DIAGNOSIS — M5441 Lumbago with sciatica, right side: Secondary | ICD-10-CM | POA: Diagnosis not present

## 2022-01-02 DIAGNOSIS — M5442 Lumbago with sciatica, left side: Secondary | ICD-10-CM | POA: Diagnosis not present

## 2022-01-02 DIAGNOSIS — M5459 Other low back pain: Secondary | ICD-10-CM

## 2022-01-02 DIAGNOSIS — M62830 Muscle spasm of back: Secondary | ICD-10-CM

## 2022-01-02 NOTE — Therapy (Signed)
OUTPATIENT PHYSICAL THERAPY THORACOLUMBAR TREATMENT   Patient Name: Eric Cook MRN: [id number] DOB:January 20, 1946, 76 y.o., male Today's Date: 01/02/2022  END OF SESSION:  PT End of Session - 01/02/22 1106     Visit Number 3    Number of Visits 12    Date for PT Re-Evaluation 02/05/22    Authorization Type FOTO AT LEAST EVERY 5TH VISIT.  PROGRESS NOTE AT 10TH VISIT.  KX MODIFIER AFTER 15 VISITS.    PT Start Time 1030    Activity Tolerance Patient tolerated treatment well    Behavior During Therapy WFL for tasks assessed/performed             Past Medical History:  Diagnosis Date   Anxiety    HLD (hyperlipidemia)    Hyperlipidemia    Hypertension    Peptic ulcer    Seizures ([place])    medication related   Past Surgical History:  Procedure Laterality Date   LUMBAR DISC SURGERY     L3-L4   UPPER GASTROINTESTINAL ENDOSCOPY     Patient Active Problem List   Diagnosis Date Noted   Partial symptomatic epilepsy with complex partial seizures, intractable, without status epilepticus ([place]) 12/01/2019   Urine frequency 11/09/2019   Diarrhea 11/09/2019   Closed compression fracture of L3 lumbar vertebra, initial encounter ([place]) 123456   Acute metabolic encephalopathy [id number]   HTN (hypertension) 08/23/2019   BPH (benign prostatic hyperplasia) 08/23/2019   GAD (generalized anxiety disorder) 08/23/2019   Acute confusional state 08/23/2019    REFERRING PROVIDER: Rodman Key Hunsucker  REFERRING DIAG: Low back pain due to bilateral sciatica.  Rationale for Evaluation and Treatment: Rehabilitation  THERAPY DIAG:  Other low back pain  Muscle spasm of back  ONSET DATE: Ongoing but an exacerbation this summer.  SUBJECTIVE:                                                                                                                                                                                           SUBJECTIVE STATEMENT: Pain about a 5/10.  Right side  hurting.  PERTINENT HISTORY:  HTN, previous lumbar surgery.  PAIN:  Are you having pain? Yes: NPRS scale: 5/10 Pain location: Bilateral lower lumbar region. Pain description: Ache, throb. Aggravating factors: Sitting. Relieving factors: Walking.  PRECAUTIONS: None  OCCUPATION: Retired.  PLOF: Independent  PATIENT GOALS: Decrease pain, sit longer.    OBJECTIVE:    TODAY'S TREATMENT:  DATE:                                     EXERCISE LOG  Exercise Repetitions and Resistance Comments  Nustep Lvl 3 x 10 mins                   Patient in left side-lying position with folded pillow between knees for comfort:  Combo e'stim/US at 1.50 W/CM2 x 8 minutes f/b STW/M x 5 minutes to patient's right low back with QL release technique including ischemic release technique f/b IFC at 80-150 Hz on 40% scan x 20 minutes to patient's right low back region. ASSESSMENT:  CLINICAL IMPRESSION: Patient's right QL very tender to palpation.  He enjoyed STW/M to this region with a good response following treatment. Patient tolerated treatment without complaint with normal modality response following removal of modality. OBJECTIVE IMPAIRMENTS: Abnormal gait, decreased activity tolerance, decreased ROM, increased muscle spasms, and pain.   ACTIVITY LIMITATIONS: lifting and sitting  PARTICIPATION LIMITATIONS: yard work  PERSONAL FACTORS: Time since onset of injury/illness/exacerbation are also affecting patient's functional outcome.   REHAB POTENTIAL: Good  CLINICAL DECISION MAKING: Stable/uncomplicated  EVALUATION COMPLEXITY: Low   GOALS: Goals reviewed with patient? Yes  SHORT TERM GOALS: Target date: 01/18/22  Ind with HEP. Baseline: Goal status: INITIAL   LONG TERM GOALS: Target date: 02/05/22  Sit 20 minutes with pain not > 3-4/10. Baseline:  Goal  status: INITIAL  2.  Perform ADL's with pain not > 3-4/10. Baseline:  Goal status: INITIAL  3.  Perform transitory movements with pain not > 3-4/10. Baseline:  Goal status: INITIAL  PLAN:  PT FREQUENCY: 2x/week  PT DURATION: 6 weeks  PLANNED INTERVENTIONS: Therapeutic exercises, Therapeutic activity, Patient/Family education, Self Care, Dry Needling, Electrical stimulation, Cryotherapy, Moist heat, Ultrasound, and Manual therapy.  PLAN FOR NEXT SESSION: Combo e'stim/US, STW/M, core exercise progression.   Jaimie Pippins, [place], PT 01/02/2022, 11:41 AM

## 2022-01-04 ENCOUNTER — Ambulatory Visit: Payer: Medicare HMO

## 2022-01-04 DIAGNOSIS — M5441 Lumbago with sciatica, right side: Secondary | ICD-10-CM | POA: Diagnosis not present

## 2022-01-04 DIAGNOSIS — M5442 Lumbago with sciatica, left side: Secondary | ICD-10-CM | POA: Diagnosis not present

## 2022-01-04 DIAGNOSIS — M5459 Other low back pain: Secondary | ICD-10-CM | POA: Diagnosis not present

## 2022-01-04 DIAGNOSIS — M62830 Muscle spasm of back: Secondary | ICD-10-CM

## 2022-01-04 NOTE — Therapy (Signed)
OUTPATIENT PHYSICAL THERAPY THORACOLUMBAR TREATMENT   Patient Name: Eric Cook MRN: [id number] DOB:05/08/1945, 76 y.o., male Today's Date: 01/04/2022  END OF SESSION:  PT End of Session - 01/04/22 1036     Visit Number 4    Number of Visits 12    Date for PT Re-Evaluation 02/05/22    Authorization Type FOTO AT LEAST EVERY 5TH VISIT.  PROGRESS NOTE AT 10TH VISIT.  KX MODIFIER AFTER 15 VISITS.    PT Start Time 1030    PT Stop Time 1111    PT Time Calculation (min) 41 min    Activity Tolerance Patient tolerated treatment well    Behavior During Therapy WFL for tasks assessed/performed             Past Medical History:  Diagnosis Date   Anxiety    HLD (hyperlipidemia)    Hyperlipidemia    Hypertension    Peptic ulcer    Seizures (HCC)    medication related   Past Surgical History:  Procedure Laterality Date   LUMBAR DISC SURGERY     L3-L4   UPPER GASTROINTESTINAL ENDOSCOPY     Patient Active Problem List   Diagnosis Date Noted   Partial symptomatic epilepsy with complex partial seizures, intractable, without status epilepticus (HCC) 12/01/2019   Urine frequency 11/09/2019   Diarrhea 11/09/2019   Closed compression fracture of L3 lumbar vertebra, initial encounter (HCC) 08/25/2019   Acute metabolic encephalopathy 08/24/2019   HTN (hypertension) 08/23/2019   BPH (benign prostatic hyperplasia) 08/23/2019   GAD (generalized anxiety disorder) 08/23/2019   Acute confusional state 08/23/2019    REFERRING PROVIDER: Molli Hazard Hunsucker  REFERRING DIAG: Low back pain due to bilateral sciatica.  Rationale for Evaluation and Treatment: Rehabilitation  THERAPY DIAG:  Other low back pain  Muscle spasm of back  ONSET DATE: Ongoing but an exacerbation this summer.  SUBJECTIVE:                                                                                                                                                                                            SUBJECTIVE STATEMENT: Pt arrives for today's treatment session reporiting 7/10 right low back pain.  Pt requests ending treatment early today due to another appointment elsewhere.   PERTINENT HISTORY:  HTN, previous lumbar surgery.  PAIN:  Are you having pain? Yes: NPRS scale: 7/10 Pain location: Bilateral lower lumbar region. Pain description: Ache, throb. Aggravating factors: Sitting. Relieving factors: Walking.  PRECAUTIONS: None  OCCUPATION: Retired.  PLOF: Independent  PATIENT GOALS: Decrease pain, sit longer.    OBJECTIVE:    TODAY'S TREATMENT:  DATE:                                     EXERCISE LOG  Exercise Repetitions and Resistance Comments  Nustep Lvl 3 x 15 mins        Manual Therapy Soft Tissue Mobilization: right QL, STW/M to right QL to decrease pain and tone with pt positioned in left side-lying and with pillow between his knees for comfort.      Modalities  Date:  Unattended Estim: Lumbar, IFC 80-150 Hz, 10 mins, Pain and Tone Hot Pack: Lumbar, 10 mins, Pain and Tone  ASSESSMENT:  CLINICAL IMPRESSION: Pt arrives for today's treatment session reporting 7/10 right low back pain.  Pt request treatment session to end early today, due to another engagement.  STW/M performed to right QL to decrease pain and tone with good result.  Normal responses to estim and MH noted upon removal.  Pt reported 5/10 low back pain at completion of today's treatment session.  OBJECTIVE IMPAIRMENTS: Abnormal gait, decreased activity tolerance, decreased ROM, increased muscle spasms, and pain.   ACTIVITY LIMITATIONS: lifting and sitting  PARTICIPATION LIMITATIONS: yard work  PERSONAL FACTORS: Time since onset of injury/illness/exacerbation are also affecting patient's functional outcome.   REHAB POTENTIAL: Good  CLINICAL DECISION MAKING:  Stable/uncomplicated  EVALUATION COMPLEXITY: Low   GOALS: Goals reviewed with patient? Yes  SHORT TERM GOALS: Target date: 01/18/22  Ind with HEP. Baseline: Goal status: INITIAL   LONG TERM GOALS: Target date: 02/05/22  Sit 20 minutes with pain not > 3-4/10. Baseline:  Goal status: INITIAL  2.  Perform ADL's with pain not > 3-4/10. Baseline:  Goal status: INITIAL  3.  Perform transitory movements with pain not > 3-4/10. Baseline:  Goal status: INITIAL  PLAN:  PT FREQUENCY: 2x/week  PT DURATION: 6 weeks  PLANNED INTERVENTIONS: Therapeutic exercises, Therapeutic activity, Patient/Family education, Self Care, Dry Needling, Electrical stimulation, Cryotherapy, Moist heat, Ultrasound, and Manual therapy.  PLAN FOR NEXT SESSION: Combo e'stim/US, STW/M, core exercise progression.   Kathrynn Ducking, PTA 01/04/2022, 11:38 AM

## 2022-01-08 ENCOUNTER — Ambulatory Visit: Payer: Medicare HMO

## 2022-01-08 DIAGNOSIS — M5441 Lumbago with sciatica, right side: Secondary | ICD-10-CM | POA: Diagnosis not present

## 2022-01-08 DIAGNOSIS — M5442 Lumbago with sciatica, left side: Secondary | ICD-10-CM | POA: Diagnosis not present

## 2022-01-08 DIAGNOSIS — M62830 Muscle spasm of back: Secondary | ICD-10-CM

## 2022-01-08 DIAGNOSIS — M5459 Other low back pain: Secondary | ICD-10-CM | POA: Diagnosis not present

## 2022-01-08 NOTE — Therapy (Signed)
OUTPATIENT PHYSICAL THERAPY THORACOLUMBAR TREATMENT   Patient Name: Eric Cook MRN: [id number] DOB:May 09, 1945, 76 y.o., male Today's Date: 01/08/2022  END OF SESSION:  PT End of Session - 01/08/22 0947     Visit Number 5    Number of Visits 12    Date for PT Re-Evaluation 02/05/22    Authorization Type FOTO AT LEAST EVERY 5TH VISIT.  PROGRESS NOTE AT 10TH VISIT.  KX MODIFIER AFTER 15 VISITS.    Authorization - Number of Visits 10    PT Start Time 0945    PT Stop Time 1030    PT Time Calculation (min) 45 min    Activity Tolerance Patient tolerated treatment well    Behavior During Therapy WFL for tasks assessed/performed             Past Medical History:  Diagnosis Date   Anxiety    HLD (hyperlipidemia)    Hyperlipidemia    Hypertension    Peptic ulcer    Seizures (HCC)    medication related   Past Surgical History:  Procedure Laterality Date   LUMBAR DISC SURGERY     L3-L4   UPPER GASTROINTESTINAL ENDOSCOPY     Patient Active Problem List   Diagnosis Date Noted   Partial symptomatic epilepsy with complex partial seizures, intractable, without status epilepticus (HCC) 12/01/2019   Urine frequency 11/09/2019   Diarrhea 11/09/2019   Closed compression fracture of L3 lumbar vertebra, initial encounter (HCC) 08/25/2019   Acute metabolic encephalopathy 08/24/2019   HTN (hypertension) 08/23/2019   BPH (benign prostatic hyperplasia) 08/23/2019   GAD (generalized anxiety disorder) 08/23/2019   Acute confusional state 08/23/2019    REFERRING PROVIDER: Molli Hazard Hunsucker  REFERRING DIAG: Low back pain due to bilateral sciatica.  Rationale for Evaluation and Treatment: Rehabilitation  THERAPY DIAG:  Other low back pain  Muscle spasm of back  ONSET DATE: Ongoing but an exacerbation this summer.  SUBJECTIVE:                                                                                                                                                                                            SUBJECTIVE STATEMENT: Patient reports that his back feels better than it did at his last appointment. He notes that his last appointment seemed to help as only his left low back is hurting right now.   PERTINENT HISTORY:  HTN, previous lumbar surgery.  PAIN:  Are you having pain? Yes: NPRS scale: 4/10 Pain location: Bilateral lower lumbar region. Pain description: Ache, throb. Aggravating factors: Sitting. Relieving factors: Walking.  PRECAUTIONS: None  OCCUPATION: Retired.  PLOF: Independent  PATIENT  GOALS: Decrease pain, sit longer.    OBJECTIVE:    TODAY'S TREATMENT:                                                                                                                              DATE:                                    12/18 EXERCISE LOG  Exercise Repetitions and Resistance Comments  Nustep  L4 x 15 minutes                    Blank cell = exercise not performed today  Manual Therapy Soft Tissue Mobilization: bilateral QL, for reduced pain and tone with moderate effectiveness   Modalities  Date:  Unattended Estim: Lumbar, IFC @ 80-150 Hz w/ 40 % scan, 15 mins, Pain and Tone                                   12/14 EXERCISE LOG  Exercise Repetitions and Resistance Comments  Nustep Lvl 3 x 15 mins        Manual Therapy Soft Tissue Mobilization: right QL, STW/M to right QL to decrease pain and tone with pt positioned in left side-lying and with pillow between his knees for comfort.      Modalities  Date:  Unattended Estim: Lumbar, IFC 80-150 Hz, 10 mins, Pain and Tone Hot Pack: Lumbar, 10 mins, Pain and Tone  ASSESSMENT:  CLINICAL IMPRESSION: Treatment focused on familiar interventions for reduced lumbar pain with soft tissue to his left QL being the most effective at reducing his familiar pain. This was followed by electrical stimulation to the area which was able to further reduce his familiar pain. He  reported feeling better upon the conclusion of treatment. He continues to require skilled physical therapy to address his remaining impairments to return to his prior level of function.   OBJECTIVE IMPAIRMENTS: Abnormal gait, decreased activity tolerance, decreased ROM, increased muscle spasms, and pain.   ACTIVITY LIMITATIONS: lifting and sitting  PARTICIPATION LIMITATIONS: yard work  PERSONAL FACTORS: Time since onset of injury/illness/exacerbation are also affecting patient's functional outcome.   REHAB POTENTIAL: Good  CLINICAL DECISION MAKING: Stable/uncomplicated  EVALUATION COMPLEXITY: Low   GOALS: Goals reviewed with patient? Yes  SHORT TERM GOALS: Target date: 01/18/22  Ind with HEP. Baseline: Goal status: INITIAL   LONG TERM GOALS: Target date: 02/05/22  Sit 20 minutes with pain not > 3-4/10. Baseline:  Goal status: INITIAL  2.  Perform ADL's with pain not > 3-4/10. Baseline:  Goal status: INITIAL  3.  Perform transitory movements with pain not > 3-4/10. Baseline:  Goal status: INITIAL  PLAN:  PT FREQUENCY: 2x/week  PT DURATION: 6 weeks  PLANNED INTERVENTIONS: Therapeutic exercises, Therapeutic activity, Patient/Family education,  Self Care, Dry Needling, Electrical stimulation, Cryotherapy, Moist heat, Ultrasound, and Manual therapy.  PLAN FOR NEXT SESSION: Combo e'stim/US, STW/M, core exercise progression.   Darlin Coco, PT 01/08/2022, 12:53 PM

## 2022-01-10 ENCOUNTER — Ambulatory Visit: Payer: Medicare HMO | Admitting: Physical Therapy

## 2022-01-10 ENCOUNTER — Encounter: Payer: Self-pay | Admitting: Physical Therapy

## 2022-01-10 DIAGNOSIS — M5442 Lumbago with sciatica, left side: Secondary | ICD-10-CM | POA: Diagnosis not present

## 2022-01-10 DIAGNOSIS — M5441 Lumbago with sciatica, right side: Secondary | ICD-10-CM | POA: Diagnosis not present

## 2022-01-10 DIAGNOSIS — M5459 Other low back pain: Secondary | ICD-10-CM | POA: Diagnosis not present

## 2022-01-10 DIAGNOSIS — M62830 Muscle spasm of back: Secondary | ICD-10-CM

## 2022-01-10 NOTE — Therapy (Signed)
OUTPATIENT PHYSICAL THERAPY THORACOLUMBAR TREATMENT   Patient Name: Eric Cook MRN: [id number] DOB:1946-01-21, 76 y.o., male Today's Date: 01/10/2022  END OF SESSION:  PT End of Session - 01/10/22 0946     Visit Number 6    Number of Visits 12    Date for PT Re-Evaluation 02/05/22    Authorization Type FOTO AT LEAST EVERY 5TH VISIT.  PROGRESS NOTE AT 10TH VISIT.  KX MODIFIER AFTER 15 VISITS.    Authorization - Number of Visits 10    PT Start Time [phone]    PT Stop Time 1035    PT Time Calculation (min) 49 min    Activity Tolerance Patient tolerated treatment well    Behavior During Therapy WFL for tasks assessed/performed            Past Medical History:  Diagnosis Date   Anxiety    HLD (hyperlipidemia)    Hyperlipidemia    Hypertension    Peptic ulcer    Seizures (HCC)    medication related   Past Surgical History:  Procedure Laterality Date   LUMBAR DISC SURGERY     L3-L4   UPPER GASTROINTESTINAL ENDOSCOPY     Patient Active Problem List   Diagnosis Date Noted   Partial symptomatic epilepsy with complex partial seizures, intractable, without status epilepticus (HCC) 12/01/2019   Urine frequency 11/09/2019   Diarrhea 11/09/2019   Closed compression fracture of L3 lumbar vertebra, initial encounter (HCC) 08/25/2019   Acute metabolic encephalopathy 08/24/2019   HTN (hypertension) 08/23/2019   BPH (benign prostatic hyperplasia) 08/23/2019   GAD (generalized anxiety disorder) 08/23/2019   Acute confusional state 08/23/2019   REFERRING PROVIDER: Molli Hazard Hunsucker  REFERRING DIAG: Low back pain due to bilateral sciatica.  Rationale for Evaluation and Treatment: Rehabilitation  THERAPY DIAG:  Other low back pain  Muscle spasm of back  ONSET DATE: Ongoing but an exacerbation this summer.  SUBJECTIVE:                                                                                                                                                                                            SUBJECTIVE STATEMENT: Hurting today but feels its more centralized. Stated that feels as if pain moves from side to side.  PERTINENT HISTORY:  HTN, previous lumbar surgery.  PAIN:  Are you having pain? Yes: NPRS scale: 4/10 Pain location: Bilateral lower lumbar region. Pain description: Ache, throb. Aggravating factors: Sitting. Relieving factors: Walking.  PRECAUTIONS: None  OCCUPATION: Retired.  PLOF: Independent  PATIENT GOALS: Decrease pain, sit longer.  OBJECTIVE:   TODAY'S TREATMENT:  DATE:                                    12/20 EXERCISE LOG  Exercise Repetitions and Resistance Comments  Nustep  L4 x 17 minutes                    Blank cell = exercise not performed today   Manual Therapy Soft Tissue Mobilization: bilateral lumbar paraspinals, QL, for reduced pain and tone with moderate effectiveness    Modalities  Date: 01/10/22 Unattended Estim: Lumbar, IFC @ 80-150 Hz w/ 40 % scan, 15 mins, Pain and Tone  ASSESSMENT:  CLINICAL IMPRESSION: Patient presented in clinic with reports of continued LBP. Patient still moving at a slow pace for ambulation and transfers along with bed mobility. Increased tightness and tenderness reported along R lumbar paraspinals region. Normal stimulation response noted following removal of the modality.  OBJECTIVE IMPAIRMENTS: Abnormal gait, decreased activity tolerance, decreased ROM, increased muscle spasms, and pain.   ACTIVITY LIMITATIONS: lifting and sitting  PARTICIPATION LIMITATIONS: yard work  PERSONAL FACTORS: Time since onset of injury/illness/exacerbation are also affecting patient's functional outcome.   REHAB POTENTIAL: Good  CLINICAL DECISION MAKING: Stable/uncomplicated  EVALUATION COMPLEXITY: Low   GOALS: Goals reviewed with patient? Yes  SHORT  TERM GOALS: Target date: 01/18/22  Ind with HEP. Baseline: Goal status: INITIAL  LONG TERM GOALS: Target date: 02/05/22  Sit 20 minutes with pain not > 3-4/10. Baseline:  Goal status: INITIAL  2.  Perform ADL's with pain not > 3-4/10. Baseline:  Goal status: INITIAL  3.  Perform transitory movements with pain not > 3-4/10. Baseline:  Goal status: INITIAL  PLAN:  PT FREQUENCY: 2x/week  PT DURATION: 6 weeks  PLANNED INTERVENTIONS: Therapeutic exercises, Therapeutic activity, Patient/Family education, Self Care, Dry Needling, Electrical stimulation, Cryotherapy, Moist heat, Ultrasound, and Manual therapy.  PLAN FOR NEXT SESSION: Combo e'stim/US, STW/M, core exercise progression.  Standley Brooking, PTA 01/10/2022, 10:45 AM

## 2022-01-12 ENCOUNTER — Other Ambulatory Visit: Payer: Self-pay | Admitting: Adult Health

## 2022-01-17 ENCOUNTER — Ambulatory Visit (INDEPENDENT_AMBULATORY_CARE_PROVIDER_SITE_OTHER): Payer: Medicare HMO | Admitting: Family Medicine

## 2022-01-17 ENCOUNTER — Encounter: Payer: Self-pay | Admitting: Family Medicine

## 2022-01-17 VITALS — BP 127/66 | HR 72 | Temp 97.1°F | Ht 68.5 in | Wt 209.2 lb

## 2022-01-17 DIAGNOSIS — M545 Low back pain, unspecified: Secondary | ICD-10-CM

## 2022-01-17 DIAGNOSIS — Z8781 Personal history of (healed) traumatic fracture: Secondary | ICD-10-CM

## 2022-01-17 DIAGNOSIS — G8929 Other chronic pain: Secondary | ICD-10-CM | POA: Diagnosis not present

## 2022-01-17 DIAGNOSIS — L720 Epidermal cyst: Secondary | ICD-10-CM | POA: Diagnosis not present

## 2022-01-17 DIAGNOSIS — I10 Essential (primary) hypertension: Secondary | ICD-10-CM | POA: Diagnosis not present

## 2022-01-17 MED ORDER — METHYLPREDNISOLONE ACETATE 40 MG/ML IJ SUSP
40.0000 mg | Freq: Once | INTRAMUSCULAR | Status: AC
  Administered 2022-01-17: 40 mg via INTRAMUSCULAR

## 2022-01-17 MED ORDER — PREDNISONE 10 MG (21) PO TBPK: ORAL_TABLET | ORAL | 0 refills | Status: DC

## 2022-01-17 NOTE — Patient Instructions (Signed)
Epidermoid Cyst  An epidermoid cyst, also called an epidermal cyst, is a small lump under your skin. The cyst contains a substance called keratin. Do not try to pop or open the cyst yourself. What are the causes? A blocked hair follicle. A hair that curls and re-enters the skin instead of growing straight out of the skin. A blocked pore. Irritated skin. An injury to the skin. Certain conditions that are passed along from parent to child. Human papillomavirus (HPV). This happens rarely when cysts occur on the bottom of the feet. Long-term sun damage to the skin. What increases the risk? Having acne. Being male. Having an injury to the skin. Being past puberty. Having certain conditions caused by genes (genetic disorder) What are the signs or symptoms? These cysts are usually harmless, but they can get infected. Symptoms of infection may include: Redness. Inflammation. Tenderness. Warmth. Fever. A bad-smelling substance that drains from the cyst. Pus that drains from the cyst. How is this treated? In many cases, epidermoid cysts go away on their own without treatment. If a cyst becomes infected, treatment may include: Opening and draining the cyst, done by a doctor. After draining, you may need minor surgery to remove the rest of the cyst. Antibiotic medicine. Shots of medicines (steroids) that help to reduce inflammation. Surgery to remove the cyst. Surgery may be done if the cyst: Becomes large. Bothers you. Has a chance of turning into cancer. Do not try to open a cyst yourself. Follow these instructions at home: Medicines Take over-the-counter and prescription medicines as told by your doctor. If you were prescribed an antibiotic medicine, take it as told by your doctor. Do not stop taking it even if you start to feel better. General instructions Keep the area around your cyst clean and dry. Wear loose, dry clothing. Avoid touching your cyst. Check your cyst every day  for signs of infection. Check for: Redness, swelling, or pain. Fluid or blood. Warmth. Pus or a bad smell. Keep all follow-up visits. How is this prevented? Wear clean, dry, clothing. Avoid wearing tight clothing. Keep your skin clean and dry. Take showers or baths every day. Contact a doctor if: Your cyst has symptoms of infection. Your condition does not improve or gets worse. You have a cyst that looks different from other cysts you have had. You have a fever. Get help right away if: Redness spreads from the cyst into the area close by. Summary An epidermoid cyst is a small lump under your skin. If a cyst becomes infected, treatment may include surgery to open and drain the cyst, or to remove it. Take over-the-counter and prescription medicines only as told by your doctor. Contact a doctor if your condition is not improving or is getting worse. Keep all follow-up visits. This information is not intended to replace advice given to you by your health care provider. Make sure you discuss any questions you have with your health care provider. Document Revised: 04/15/2019 Document Reviewed: 04/15/2019 [employer] Patient Education  2023 [employer]  

## 2022-01-17 NOTE — Progress Notes (Signed)
Subjective: CC:HTN PCP: Janora Norlander, DO [id number] Eric Cook is a 76 y.o. male presenting to clinic today for:  1. HTN Zestoretic stopped last visit.  Started on Norvasc 5mg  daily.  He is tolerating the change in medication without difficulty.  No chest pain, shortness of breath (outside of baseline) or headache reported.  Recently started on Breo by pulmonology but too soon to tell if this is going to be helpful or not.    2.  Low back pain  he reports his biggest concern today is that he is having ongoing back pain that used to result in sciatica but now is pretty much localized to the low mid back.  He denies any weakness, numbness or tingling.  No change in bowel or bladder habits.  He used to have need for spinal injection with Dr. Maia Breslow but that doctor has since retired.  He would like to establish care with a new specialist to have possible back injections done, as physical therapy has not been helpful in alleviating his back pain yet  3.  Skin lesion Patient reports a lesion on the back of his left ear that becomes irritated and intermittently drains.  He reports that the drainage is foul smelling.  Does not report any fevers, bloody discharge.  Would like to see a specialist about getting this resolved.  Prefers [facility]   ROS: Per HPI  No Known Allergies Past Medical History:  Diagnosis Date   Anxiety    HLD (hyperlipidemia)    Hyperlipidemia    Hypertension    Peptic ulcer    Seizures ([place])    medication related    Current Outpatient Medications:    amLODipine (NORVASC) 5 MG tablet, Take 1 tablet (5 mg total) by mouth daily. STOP lisinopril/ HCTZ, Disp: 90 tablet, Rfl: 3   cimetidine (TAGAMET) 400 MG tablet, TAKE 1 TABLET TWICE DAILY AS NEEDED, Disp: 180 tablet, Rfl: 0   dicyclomine (BENTYL) 10 MG capsule, TAKE 20 MG (2 CAPSULES) 15 MINUTES BEFORE MEALS AS NEEDED, Disp: 90 capsule, Rfl: 3   fluticasone (FLONASE) 50 MCG/ACT nasal spray, Place 2 sprays  into both nostrils daily., Disp: 16 g, Rfl: 6   fluticasone furoate-vilanterol (BREO ELLIPTA) 200-25 MCG/ACT AEPB, Inhale 1 puff into the lungs daily. (Patient not taking: Reported on 12/19/2021), Disp: 60 each, Rfl: 5   levETIRAcetam (KEPPRA XR) 500 MG 24 hr tablet, TAKE 1 TABLET AT BEDTIME, Disp: 30 tablet, Rfl: 0   pantoprazole (PROTONIX) 40 MG tablet, Take 1 tablet (40 mg total) by mouth daily., Disp: 90 tablet, Rfl: 3   simvastatin (ZOCOR) 20 MG tablet, Take 1 tablet (20 mg total) by mouth daily at 6 PM., Disp: 90 tablet, Rfl: 3   tamsulosin (FLOMAX) 0.4 MG CAPS capsule, Take 1 capsule (0.4 mg total) by mouth daily., Disp: 90 capsule, Rfl: 3 Social History   Socioeconomic History   Marital status: Single    Spouse name: Not on file   Number of children: 1   Years of education: Not on file   Highest education level: Not on file  Occupational History   Occupation: Film/video editor    Comment: part time  Tobacco Use   Smoking status: Former    Packs/day: 1.00    Years: 40.00    Total pack years: 40.00    Types: Cigarettes    Quit date: 04/2019    Years since quitting: 2.7   Smokeless tobacco: Never  Vaping Use   Vaping  Use: Never used  Substance and Sexual Activity   Alcohol use: Never   Drug use: Never   Sexual activity: Never  Other Topics Concern   Not on file  Social History Narrative   Lives alone   Right Handed   Drinks 2-3 cups of caffeine daily   Son in [place] Strain: Low Risk  (06/20/2021)   Overall Financial Resource Strain (CARDIA)    Difficulty of Paying Living Expenses: Not hard at all  Food Insecurity: No Food Insecurity (06/20/2021)   Hunger Vital Sign    Worried About Running Out of Food in the Last Year: Never true    Ran Out of Food in the Last Year: Never true  Transportation Needs: No Transportation Needs (06/20/2021)   PRAPARE - Hydrologist (Medical): No     Lack of Transportation (Non-Medical): No  Physical Activity: Sufficiently Active (06/20/2021)   Exercise Vital Sign    Days of Exercise per Week: 7 days    Minutes of Exercise per Session: 30 min  Stress: No Stress Concern Present (06/20/2021)   [place]    Feeling of Stress : Only a little  Social Connections: Socially Isolated (06/20/2021)   Social Connection and Isolation Panel [NHANES]    Frequency of Communication with Friends and Family: More than three times a week    Frequency of Social Gatherings with Friends and Family: Once a week    Attends Religious Services: Never    Marine scientist or Organizations: No    Attends Archivist Meetings: Never    Marital Status: Divorced  Human resources officer Violence: Not At Risk (06/20/2021)   Humiliation, Afraid, Rape, and Kick questionnaire    Fear of Current or Ex-Partner: No    Emotionally Abused: No    Physically Abused: No    Sexually Abused: No   Family History  Problem Relation Age of Onset   Colon cancer Brother    Other Son        vertigo   Kidney Stones Son    Esophageal cancer Neg Hx    Pancreatic cancer Neg Hx    Stomach cancer Neg Hx     Objective: Office vital signs reviewed. BP 127/66   Pulse 72   Temp (!) 97.1 F (36.2 C)   Ht 5' 8.5" (1.74 m)   Wt 209 lb 3.2 oz (94.9 kg)   SpO2 96%   BMI 31.35 kg/m   Physical Examination:  General: Awake, alert, well nourished, No acute distress HEENT: sclera white, MMM Cardio: regular rate and rhythm, S1S2 heard, no murmurs appreciated Pulm: clear to auscultation bilaterally, no wheezes, rhonchi or rales; normal work of breathing on room air MSK: Flattening of the lumbar spine.  No midline tenderness palpation.  He has paraspinal muscle to tenderness to palpation, particularly on the right side.  He has mild left-sided rotation of those lumbar vertebra that are palpable on exam.  Gait is  antalgic.  Light touch sensation grossly intact bilateral lower extremities. Skin: He has an appreciable pore behind the left ear.  There is no active drainage nor erythema or induration appreciated.  Assessment/ Plan: 76 y.o. male   Essential hypertension  Chronic bilateral low back pain without sciatica - Plan: methylPREDNISolone acetate (DEPO-MEDROL) injection 40 mg, predniSONE (STERAPRED UNI-PAK 21 TAB) 10 MG (21) TBPK tablet, Ambulatory referral to Orthopedic Surgery  History of vertebral compression fracture - Plan: Ambulatory referral to Orthopedic Surgery  Epidermal cyst of ear - Plan: Ambulatory referral to Dermatology  Blood pressure well-controlled with amlodipine.  No changes  Corticosteroid injection administered today.  He was given a prednisone Dosepak to start tomorrow as well as referred to Dr. Ethelene Hal in [place] for further evaluation for possible back injections  Suspect that he has an epidermal cyst behind that left ear so I placed a referral to dermatology for further management.  May need to require resection given recurrent irritation and drainage  No orders of the defined types were placed in this encounter.  No orders of the defined types were placed in this encounter.    Raliegh Ip, DO [facility] [phone]

## 2022-01-19 ENCOUNTER — Ambulatory Visit: Payer: Medicare HMO | Admitting: Physical Therapy

## 2022-01-30 NOTE — Progress Notes (Signed)
Return phone call received regarding [facility] Referral. Patient requests referral be sent to [facility]. Message sent to those coordinators for further coordination of care. Patient aware.

## 2022-01-30 NOTE — Progress Notes (Signed)
LCS referral received. I have attempted to reach the patient regarding referral but was unable to reach the patient directly. Detailed VM left asking that the patient return my call. 

## 2022-01-31 ENCOUNTER — Other Ambulatory Visit: Payer: Self-pay | Admitting: *Deleted

## 2022-01-31 DIAGNOSIS — Z87891 Personal history of nicotine dependence: Secondary | ICD-10-CM

## 2022-01-31 DIAGNOSIS — Z122 Encounter for screening for malignant neoplasm of respiratory organs: Secondary | ICD-10-CM

## 2022-02-05 ENCOUNTER — Other Ambulatory Visit: Payer: Self-pay | Admitting: Family Medicine

## 2022-02-05 DIAGNOSIS — K219 Gastro-esophageal reflux disease without esophagitis: Secondary | ICD-10-CM

## 2022-02-12 ENCOUNTER — Other Ambulatory Visit: Payer: Self-pay | Admitting: Adult Health

## 2022-02-21 NOTE — Progress Notes (Signed)
[facility] [address] [place]. [place] [address] [phone]       OFFICE FOLLOW UP NOTE  Eric Cook Date of Birth:  December 14, 1945 Medical Record Number:  [id number]   Referring MD: Eric Canard, DO  Reason for Referral: Seizure   Chief Complaint  Patient presents with   Follow-up    Patient in room #3 and alone. Pt states things are still the she, pt has no concerns.      HPI:   Update 02/22/2022 JM: Patient returns for 1 year seizure follow-up unaccompanied.  Denies any seizure activity.  Compliant on Keppra XR 500mg  daily. Tolerating well.  Maintains ADLs and IADLs independently as well as driving.  Lab work routinely monitored by PCP. No concerns at this time.    History provided for reference purposes only Update 02/22/2021 JM: returns for 6 month seizure follow up unaccompanied.  Overall doing well.  Has not had any seizure activity.  Compliant on Keppra XR 500 mg daily without side effects.  Remains independent with ADLs and IADLs as well as driving.  CBC and CMP completed 08/2020 unremarkable.  No new concerns at this time.  Update 08/22/2020 JM: Eric Cook returns for 55-month seizure follow-up accompanied by his son.  He has been doing well since prior visit without any seizure activity and Keppra 250 mg twice daily tolerating without side effects.  He does question possibility of stopping Keppra.  Repeat EEG 02/2020 normal without evidence of seizure activity.  He has since returned back to driving without any difficulty.  Maintains ADLs and IADLs independently.  He routinely follows with his PCP Eric Cook every 3 to 4 months - has appt tomorrow and believes routine lab work will be completed at that time.  No further concerns at this time.  Update 02/22/2020 JM: Eric Cook returns for seizure follow-up accompanied by his son.  He has remained on Keppra 250 mg twice daily tolerating well without side effects and has not had any additional  seizure type activity.  Recommended repeat EEG not yet completed -patient and son thought today's visit was to have this completed.  He questions return back to driving as it has been 6 months since any seizure activity.  No concerns at this time.  Initial consult visit 11/10/2019 Eric Cook: Eric Cook is a 77 year old Caucasian male with past medical history of hypertension, hyperlipidemia and anxiety who is seen today for initial office consultation visit for new onset seizures.  He is accompanied by his son Eric Cook today.  History is obtained from them, review of electronic medical records and I personally reviewed available imaging films in PACS.  Patient had an episode on 08/24/2019 of sudden onset of speech difficulties.  He blames this on having stopped Xanax suddenly which she had been taking long-term 2 days prior.  He had also been unable to sleep the night before.  His son noticed that his speech did not make sense.  The patient called his son over the phone and kept on repeating saying I want to kill Cook.  And the son came to his house and noticed him to be confused he was also haddock involuntary rubbing movement of his hands automatically.  He appeared disoriented and had word finding difficulties and trouble completing sentences.  These symptoms lasted about 24 hours and gradually improve the next day.  He was admitted to [facility] where MRI scan of the brain showed no acute abnormality but showed mild generalized  atrophy and old left basal ganglia lacune.  EEG on 08/24/2019 showed focal left frontotemporal slowing in the 3 to 6 Hz range.  CBC, BMP and urine drug screen were negative except for benzos.  Patient was seen by Eric Cook neurologist who had strong suspicion for complex partial seizure and started up with patient on Keppra.  Patient has been taking Keppra 250 mg twice daily which seems to be tolerating well and has had no further episodes like this since he left the hospital.  He  is been having some pain in his pelvis related to his prostate and has an upcoming appointment to see a urologist for that.  He denies any prior history of seizures, significant head injury with loss of consciousness, strokes or TIA.  There is no family history of epilepsy.  He denies drinking alcohol, using marijuana or drugs of abuse.  ROS:   14 system review of systems is positive for those listed in HPI and all other systems negative  PMH:  Past Medical History:  Diagnosis Date   Anxiety    HLD (hyperlipidemia)    Hyperlipidemia    Hypertension    Peptic ulcer    Seizures (HCC)    medication related    Social History:  Social History   Socioeconomic History   Marital status: Single    Spouse name: Not on file   Number of children: 1   Years of education: Not on file   Highest education level: Not on file  Occupational History   Occupation: Lobbyist    Comment: part time  Tobacco Use   Smoking status: Former    Packs/day: 1.00    Years: 40.00    Total pack years: 40.00    Types: Cigarettes    Quit date: 04/2019    Years since quitting: 2.8   Smokeless tobacco: Never  Vaping Use   Vaping Use: Never used  Substance and Sexual Activity   Alcohol use: Never   Drug use: Never   Sexual activity: Never  Other Topics Concern   Not on file  Social History Narrative   Lives alone   Right Handed   Drinks 2-3 cups of caffeine daily   Son in [place]   Social Determinants of Health   Financial Resource Strain: Low Risk  (06/20/2021)   Overall Financial Resource Strain (CARDIA)    Difficulty of Paying Living Expenses: Not hard at all  Food Insecurity: No Food Insecurity (06/20/2021)   Hunger Vital Sign    Worried About Running Out of Food in the Last Year: Never true    Ran Out of Food in the Last Year: Never true  Transportation Needs: No Transportation Needs (06/20/2021)   PRAPARE - Administrator, Civil Service (Medical): No    Lack of  Transportation (Non-Medical): No  Physical Activity: Sufficiently Active (06/20/2021)   Exercise Vital Sign    Days of Exercise per Week: 7 days    Minutes of Exercise per Session: 30 min  Stress: No Stress Concern Present (06/20/2021)   [employer] of Occupational Health - Occupational Stress Questionnaire    Feeling of Stress : Only a little  Social Connections: Socially Isolated (06/20/2021)   Social Connection and Isolation Panel [NHANES]    Frequency of Communication with Friends and Family: More than three times a week    Frequency of Social Gatherings with Friends and Family: Once a week    Attends Religious Services: Never  Active Member of Clubs or Organizations: No    Attends Banker Meetings: Never    Marital Status: Divorced  Catering manager Violence: Not At Risk (06/20/2021)   Humiliation, Afraid, Rape, and Kick questionnaire    Fear of Current or Ex-Partner: No    Emotionally Abused: No    Physically Abused: No    Sexually Abused: No    Medications:   Current Outpatient Medications on File Prior to Visit  Medication Sig Dispense Refill   amLODipine (NORVASC) 5 MG tablet Take 1 tablet (5 mg total) by mouth daily. STOP lisinopril/ HCTZ 90 tablet 3   cimetidine (TAGAMET) 400 MG tablet TAKE 1 TABLET TWICE DAILY AS NEEDED 180 tablet 0   dicyclomine (BENTYL) 10 MG capsule TAKE 20 MG (2 CAPSULES) 15 MINUTES BEFORE MEALS AS NEEDED 90 capsule 3   fluticasone (FLONASE) 50 MCG/ACT nasal spray Place 2 sprays into both nostrils daily. 16 g 6   fluticasone furoate-vilanterol (BREO ELLIPTA) 200-25 MCG/ACT AEPB Inhale 1 puff into the lungs daily. 60 each 5   levETIRAcetam (KEPPRA XR) 500 MG 24 hr tablet TAKE 1 TABLET AT BEDTIME 30 tablet 3   meloxicam (MOBIC) 15 MG tablet Take 15 mg by mouth daily.     pantoprazole (PROTONIX) 40 MG tablet Take 1 tablet (40 mg total) by mouth daily. 90 tablet 3   predniSONE (STERAPRED UNI-PAK 21 TAB) 10 MG (21) TBPK tablet As  directed x 6 days 21 tablet 0   simvastatin (ZOCOR) 20 MG tablet Take 1 tablet (20 mg total) by mouth daily at 6 PM. 90 tablet 3   tamsulosin (FLOMAX) 0.4 MG CAPS capsule Take 1 capsule (0.4 mg total) by mouth daily. 90 capsule 3   No current facility-administered medications on file prior to visit.    Allergies:  No Known Allergies  Physical Exam Today's Vitals   02/22/22 0748  BP: (!) 145/78  Pulse: 79  Weight: 210 lb (95.3 kg)  Height: 5\' 8"  (1.727 m)    Body mass index is 31.93 kg/m.   General: well developed, well nourished, pleasant elderly Caucasian male, seated, in no evident distress Head: head normocephalic and atraumatic.   Neck: supple with no carotid or supraclavicular bruits Cardiovascular: regular rate and rhythm, no murmurs Musculoskeletal: no deformity Skin:  no rash/petichiae Vascular:  Normal pulses all extremities  Neurologic Exam Mental Status: Awake and fully alert.  Fluent speech and language.  Oriented to place and time. Recent and remote memory intact. Attention span, concentration and fund of knowledge appropriate. Mood and affect appropriate.    Cranial Nerves: Pupils equal, briskly reactive to light. Extraocular movements full without nystagmus. Visual fields full to confrontation. Hearing intact. Facial sensation intact. Face, tongue, palate moves normally and symmetrically.  Motor: Normal bulk and tone. Normal strength in all tested extremity muscles. Sensory.: intact to touch , pinprick , position and vibratory sensation.  Coordination: Rapid alternating movements normal in all extremities. Finger-to-nose and heel-to-shin performed accurately bilaterally. Gait and Station: Arises from chair without difficulty. Stance is normal. Gait demonstrates normal stride length and balance . Able to heel, toe and tandem walk with slight difficulty.  Reflexes: 1+ and symmetric. Toes downgoing.        ASSESSMENT/PLAN: 77 year old Caucasian male with  recurrent transient episodes of speech disturbance, automatisms and confusion disorientation likely complex partial seizures on 08/23/2019.  Unremarkable brain imaging but abnormal EEG showing focal left frontotemporal slowing.    1. Partial symptomatic epilepsy with complex partial seizures,  not intractable, without status epilepticus (HCC) -Continue Keppra XR 500mg  nightly - refill provided -lab work including CMP and CBC 09/2021 with PCP WNL -EEG 08/24/2019 cortical dysfunction in the left frontotemporal region possibly postictal state -EEG 03/07/2020 normal without evidence of seizures -MR brain 08/24/2019 small remote left basal ganglia infarct, moderate cerebral atrophy and mild chronic microvascular ischemic changes -advised to avoid seizure provoking triggers like abrupt medication discontinuation, medication noncompliance, alcohol, extremes of activity and exertion and abrupt changes in diet    Follow-up in 1 year via MyChart VV per pt request or call earlier if needed   CC:  Delynn Flavin M, DO   I spent 16 minutes of face-to-face and non-face-to-face time with patient.  This included previsit chart review, lab review, study review, electronic health record documentation, patient education and discussion regarding above diagnoses and treatment plan and answered all the questions to patient's satisfaction  Ihor Austin, [facility]  [facility] [address] [place], [place] [address]  Phone [phone] Fax [phone] Note: This document was prepared with digital dictation and possible smart phrase technology. Any transcriptional errors that result from this process are unintentional.

## 2022-02-22 ENCOUNTER — Encounter: Payer: Self-pay | Admitting: Adult Health

## 2022-02-22 ENCOUNTER — Ambulatory Visit: Payer: Medicare HMO | Admitting: Adult Health

## 2022-02-22 VITALS — BP 145/78 | HR 79 | Ht 68.0 in | Wt 210.0 lb

## 2022-02-22 DIAGNOSIS — G40219 Localization-related (focal) (partial) symptomatic epilepsy and epileptic syndromes with complex partial seizures, intractable, without status epilepticus: Secondary | ICD-10-CM | POA: Diagnosis not present

## 2022-02-22 MED ORDER — LEVETIRACETAM ER 500 MG PO TB24: 500.0000 mg | ORAL_TABLET | Freq: Every day | ORAL | 3 refills | Status: DC

## 2022-02-22 NOTE — Patient Instructions (Addendum)
Your Plan:  Continue Keppra XR 500 mg nightly for seizure prevention  Please call with any seizure activity    Follow-up in 1 year or call earlier if needed     Thank you for coming to see [place] at [facility]. I hope we have been able to provide you high quality care today.  You may receive a patient satisfaction survey over the next few weeks. We would appreciate your feedback and comments so that we may continue to improve ourselves and the health of our patients.

## 2022-03-02 ENCOUNTER — Other Ambulatory Visit: Payer: Self-pay | Admitting: Internal Medicine

## 2022-03-02 DIAGNOSIS — K222 Esophageal obstruction: Secondary | ICD-10-CM

## 2022-03-02 DIAGNOSIS — K219 Gastro-esophageal reflux disease without esophagitis: Secondary | ICD-10-CM

## 2022-03-02 DIAGNOSIS — K209 Esophagitis, unspecified without bleeding: Secondary | ICD-10-CM

## 2022-03-02 DIAGNOSIS — R1310 Dysphagia, unspecified: Secondary | ICD-10-CM

## 2022-03-14 ENCOUNTER — Ambulatory Visit (INDEPENDENT_AMBULATORY_CARE_PROVIDER_SITE_OTHER): Payer: Medicare HMO | Admitting: Acute Care

## 2022-03-14 ENCOUNTER — Telehealth: Payer: Self-pay | Admitting: Acute Care

## 2022-03-14 ENCOUNTER — Encounter: Payer: Self-pay | Admitting: Acute Care

## 2022-03-14 DIAGNOSIS — F17211 Nicotine dependence, cigarettes, in remission: Secondary | ICD-10-CM | POA: Diagnosis not present

## 2022-03-14 DIAGNOSIS — Z87891 Personal history of nicotine dependence: Secondary | ICD-10-CM | POA: Diagnosis not present

## 2022-03-14 NOTE — Telephone Encounter (Signed)
Pt is scheduled for a shared decision making tele visit at 9 am this morning. There was no answer on either his home of mobile numbers. I have left a HIPAA compliant message with our contact information on both VM. Pt. CT Scan is scheduled for 03/15/22. If we cannot get this visit done this morning, patient will need to be rescheduled.

## 2022-03-14 NOTE — Progress Notes (Signed)
Virtual Visit via Telephone Note  I connected with Eric Cook on 03/14/22 at  9:00 AM EST by telephone and verified that I am speaking with the correct person using two identifiers.  Location: Patient: At home Provider: [place], [place], [place], [address]    I discussed the limitations, risks, security and privacy concerns of performing an evaluation and management service by telephone and the availability of in person appointments. I also discussed with the patient that there may be a patient responsible charge related to this service. The patient expressed understanding and agreed to proceed.   Shared Decision Making Visit Lung Cancer Screening Program [phone])   Eligibility: Age 77 y.o. Pack Years Smoking History Calculation 54 pack year smoking history (# packs/per year x # years smoked) Recent History of coughing up blood  no Unexplained weight loss? no ( >Than 15 pounds within the last 6 months ) Prior History Lung / other cancer no (Diagnosis within the last 5 years already requiring surveillance chest CT Scans). Smoking Status Former Smoker Former Smokers: Years since quit: 2 years  Quit Date: 04/2019  Visit Components: Discussion included one or more decision making aids. yes Discussion included risk/benefits of screening. yes Discussion included potential follow up diagnostic testing for abnormal scans. yes Discussion included meaning and risk of over diagnosis. yes Discussion included meaning and risk of False Positives. yes Discussion included meaning of total radiation exposure. yes  Counseling Included: Importance of adherence to annual lung cancer LDCT screening. yes Impact of comorbidities on ability to participate in the program. yes Ability and willingness to under diagnostic treatment. yes  Smoking Cessation Counseling: Current Smokers:  Discussed importance of smoking cessation. yes Information about tobacco cessation classes and  interventions provided to patient. yes Patient provided with "ticket" for LDCT Scan. yes Symptomatic Patient. no  Counseling NA Diagnosis Code: Tobacco Use Z72.0 Asymptomatic Patient yes  Counseling (Intermediate counseling: > three minutes counseling) [id number] Former Smokers:  Discussed the importance of maintaining cigarette abstinence. yes Diagnosis Code: Personal History of Nicotine Dependence. [id number] Information about tobacco cessation classes and interventions provided to patient. Yes Patient provided with "ticket" for LDCT Scan. yes Written Order for Lung Cancer Screening with LDCT placed in Epic. Yes (CT Chest Lung Cancer Screening Low Dose W/O CM) [id number] Z12.2-Screening of respiratory organs Z87.891-Personal history of nicotine dependence  I spent 25 minutes of face to face time/virtual visit time  with  Eric Cook discussing the risks and benefits of lung cancer screening. We took the time to pause the power point at intervals to allow for questions to be asked and answered to ensure understanding. We discussed that he had taken the single most powerful action possible to decrease his risk of developing lung cancer when he quit smoking. I counseled him to remain smoke free, and to contact me if he ever had the desire to smoke again so that I can provide resources and tools to help support the effort to remain smoke free. We discussed the time and location of the scan, and that either  Eric Glassman RN, Eric Prince, RN or I  or I will call / send a letter with the results within  24-72 hours of receiving them. He has the office contact information in the event he needs to speak with me,  He verbalized understanding of all of the above and had no further questions upon leaving the office.     I explained to the patient that there has been  a high incidence of coronary artery disease noted on these exams. I explained that this is a non-gated exam therefore degree or severity cannot be  determined. This patient is on statin therapy. I have asked the patient to follow-up with their PCP regarding any incidental finding of coronary artery disease and management with diet or medication as they feel is clinically indicated. The patient verbalized understanding of the above and had no further questions.     Eric Spatz, NP 03/14/2022

## 2022-03-14 NOTE — Telephone Encounter (Signed)
error 

## 2022-03-14 NOTE — Patient Instructions (Signed)
Thank you for participating in the [facility]. It was our pleasure to meet you today. We will call you with the results of your scan within the next few days. Your scan will be assigned a Lung RADS category score by the physicians reading the scans.  This Lung RADS score determines follow up scanning.  See below for description of categories, and follow up screening recommendations. We will be in touch to schedule your follow up screening annually or based on recommendations of our providers. We will fax a copy of your scan results to your Primary Care Physician, or the physician who referred you to the program, to ensure they have the results. Please call the office if you have any questions or concerns regarding your scanning experience or results.  Our office number is [phone]. Please speak with Denise Phelps, RN. , or  Denise Buckner RN, They are  our Lung Cancer Screening RN.'s If They are unavailable when you call, Please leave a message on the voice mail. We will return your call at our earliest convenience.This voice mail is monitored several times a day.  Remember, if your scan is normal, we will scan you annually as long as you continue to meet the criteria for the program. (Age 50-80, Current smoker or smoker who has quit within the last 15 years). If you are a smoker, remember, quitting is the single most powerful action that you can take to decrease your risk of lung cancer and other pulmonary, breathing related problems. We know quitting is hard, and we are here to help.  Please let us know if there is anything we can do to help you meet your goal of quitting. If you are a former smoker, congratulations. We are proud of you! Remain smoke free! Remember you can refer friends or family members through the number above.  We will screen them to make sure they meet criteria for the program. Thank you for helping us take better care of you by  participating in Lung Screening.  You can receive free nicotine replacement therapy ( patches, gum or mints) by calling 1-800-QUIT NOW. Please call so we can get you on the path to becoming  a non-smoker. I know it is hard, but you can do this!  Lung RADS Categories:  Lung RADS 1: no nodules or definitely non-concerning nodules.  Recommendation is for a repeat annual scan in 12 months.  Lung RADS 2:  nodules that are non-concerning in appearance and behavior with a very low likelihood of becoming an active cancer. Recommendation is for a repeat annual scan in 12 months.  Lung RADS 3: nodules that are probably non-concerning , includes nodules with a low likelihood of becoming an active cancer.  Recommendation is for a 6-month repeat screening scan. Often noted after an upper respiratory illness. We will be in touch to make sure you have no questions, and to schedule your 6-month scan.  Lung RADS 4 A: nodules with concerning findings, recommendation is most often for a follow up scan in 3 months or additional testing based on our provider's assessment of the scan. We will be in touch to make sure you have no questions and to schedule the recommended 3 month follow up scan.  Lung RADS 4 B:  indicates findings that are concerning. We will be in touch with you to schedule additional diagnostic testing based on our provider's  assessment of the scan.  Other options for assistance in smoking cessation (   As covered by your insurance benefits)  Hypnosis for smoking cessation  [employer] [phone]  Acupuncture for smoking cessation  [facility] [phone]   

## 2022-03-14 NOTE — Telephone Encounter (Signed)
Pt called back in and visit was able to be completed.

## 2022-03-15 ENCOUNTER — Ambulatory Visit
Admission: RE | Admit: 2022-03-15 | Discharge: 2022-03-15 | Disposition: A | Discharge status: Home / Self Care | Payer: Medicare HMO | Source: Ambulatory Visit | Attending: Acute Care | Admitting: Acute Care

## 2022-03-15 DIAGNOSIS — J432 Centrilobular emphysema: Secondary | ICD-10-CM | POA: Diagnosis not present

## 2022-03-15 DIAGNOSIS — Z87891 Personal history of nicotine dependence: Secondary | ICD-10-CM

## 2022-03-15 DIAGNOSIS — Z122 Encounter for screening for malignant neoplasm of respiratory organs: Secondary | ICD-10-CM

## 2022-03-15 DIAGNOSIS — I2510 Atherosclerotic heart disease of native coronary artery without angina pectoris: Secondary | ICD-10-CM | POA: Diagnosis not present

## 2022-03-15 DIAGNOSIS — I700 Atherosclerosis of aorta: Secondary | ICD-10-CM | POA: Diagnosis not present

## 2022-03-16 ENCOUNTER — Telehealth (INDEPENDENT_AMBULATORY_CARE_PROVIDER_SITE_OTHER): Payer: Medicare HMO | Admitting: Family Medicine

## 2022-03-16 ENCOUNTER — Telehealth: Payer: Self-pay | Admitting: *Deleted

## 2022-03-16 ENCOUNTER — Other Ambulatory Visit: Payer: Self-pay | Admitting: Family Medicine

## 2022-03-16 ENCOUNTER — Other Ambulatory Visit: Payer: Medicare HMO

## 2022-03-16 ENCOUNTER — Other Ambulatory Visit: Payer: Self-pay

## 2022-03-16 ENCOUNTER — Other Ambulatory Visit: Payer: Self-pay | Admitting: Acute Care

## 2022-03-16 ENCOUNTER — Encounter: Payer: Self-pay | Admitting: Family Medicine

## 2022-03-16 DIAGNOSIS — E041 Nontoxic single thyroid nodule: Secondary | ICD-10-CM

## 2022-03-16 DIAGNOSIS — J301 Allergic rhinitis due to pollen: Secondary | ICD-10-CM

## 2022-03-16 DIAGNOSIS — Z122 Encounter for screening for malignant neoplasm of respiratory organs: Secondary | ICD-10-CM

## 2022-03-16 DIAGNOSIS — Z87891 Personal history of nicotine dependence: Secondary | ICD-10-CM

## 2022-03-16 NOTE — Telephone Encounter (Signed)
Left message for pt to call back regarding lung screening CT results. Need to discuss new Thyroid nodule seen .

## 2022-03-16 NOTE — Telephone Encounter (Signed)
Eric Cook, please put him in for a televisit in the PM so that I can document, and possibly add some labs to be collected.

## 2022-03-16 NOTE — Telephone Encounter (Signed)
Scheduled

## 2022-03-16 NOTE — Telephone Encounter (Signed)
Pt informed of CT results per Eric Form, NP.  Pt advised of thryoid nodule see. Pt does not remember having been told he has this or had any further studies for this. I advised pt I would let Dr Lajuana Ripple know to see if she can order a thyroid ultrasound. I advised pt if he has not heard from her office in 1 week to reach out to them. PT verbalized understanding.  Copy of CT sent to PCP.  Order placed for 1 yr f/u CT.

## 2022-03-16 NOTE — Progress Notes (Signed)
Telephone visit  Subjective: Eric Cook nodule PCP: Janora Norlander, DO Eric Cook is a 77 y.o. male calls for telephone consult today. Patient provides verbal consent for consult held via phone.  Due to COVID-19 pandemic this visit was conducted virtually. This visit type was conducted due to national recommendations for restrictions regarding the COVID-19 Pandemic (e.g. social distancing, sheltering in place) in an effort to limit this patient's exposure and mitigate transmission in our community. All issues noted in this document were discussed and addressed.  A physical exam was not performed with this format.   Location of patient: home Location of provider: [facility] Others present for call: none  1. Thyroid nodules Found incidentally on CT lung scan.  Admits to some dysphagia and change in voice.  Denies night sweats/ diarrhea/ heart palpitation.  No unplanned weight loss.   ROS: Per HPI  No Known Allergies Past Medical History:  Diagnosis Date   Anxiety    HLD (hyperlipidemia)    Hyperlipidemia    Hypertension    Peptic ulcer    Seizures ([place])    medication related    Current Outpatient Medications:    amLODipine (NORVASC) 5 MG tablet, Take 1 tablet (5 mg total) by mouth daily. STOP lisinopril/ HCTZ, Disp: 90 tablet, Rfl: 3   cimetidine (TAGAMET) 400 MG tablet, TAKE 1 TABLET TWICE DAILY AS NEEDED, Disp: 180 tablet, Rfl: 0   dicyclomine (BENTYL) 10 MG capsule, TAKE 20 MG (2 CAPSULES) 15 MINUTES BEFORE MEALS AS NEEDED, Disp: 90 capsule, Rfl: 3   fluticasone (FLONASE) 50 MCG/ACT nasal spray, USE 2 SPRAYS IN EACH NOSTRIL EVERY DAY, Disp: 48 g, Rfl: 1   fluticasone furoate-vilanterol (BREO ELLIPTA) 200-25 MCG/ACT AEPB, Inhale 1 puff into the lungs daily., Disp: 60 each, Rfl: 5   levETIRAcetam (KEPPRA XR) 500 MG 24 hr tablet, Take 1 tablet (500 mg total) by mouth at bedtime., Disp: 90 tablet, Rfl: 3   meloxicam (MOBIC) 15 MG tablet, Take 15 mg by mouth daily., Disp: ,  Rfl:    pantoprazole (PROTONIX) 40 MG tablet, Take 1 tablet (40 mg total) by mouth daily., Disp: 90 tablet, Rfl: 3   predniSONE (STERAPRED UNI-PAK 21 TAB) 10 MG (21) TBPK tablet, As directed x 6 days, Disp: 21 tablet, Rfl: 0   simvastatin (ZOCOR) 20 MG tablet, Take 1 tablet (20 mg total) by mouth daily at 6 PM., Disp: 90 tablet, Rfl: 3   tamsulosin (FLOMAX) 0.4 MG CAPS capsule, Take 1 capsule (0.4 mg total) by mouth daily., Disp: 90 capsule, Rfl: 3  CT CHEST LUNG CA SCREEN LOW DOSE W/O CM  Result Date: 03/16/2022 CLINICAL DATA:  77 year old male with 54 pack-year history of smoking. Lung cancer screening. EXAM: CT CHEST WITHOUT CONTRAST LOW-DOSE FOR LUNG CANCER SCREENING TECHNIQUE: Multidetector CT imaging of the chest was performed following the standard protocol without IV contrast. RADIATION DOSE REDUCTION: This exam was performed according to the departmental dose-optimization program which includes automated exposure control, adjustment of the mA and/or kV according to patient size and/or use of iterative reconstruction technique. COMPARISON:  None Available. FINDINGS: Cardiovascular: The heart size is normal. No substantial pericardial effusion. Coronary artery calcification is evident. Moderate atherosclerotic calcification is noted in the wall of the thoracic aorta. Mediastinum/Nodes: 1.7 cm left thyroid nodule evident. No mediastinal lymphadenopathy. No evidence for gross hilar lymphadenopathy although assessment is limited by the lack of intravenous contrast on the current study. The esophagus has normal imaging features. There is no axillary lymphadenopathy. Lungs/Pleura: Centrilobular  and paraseptal emphysema evident. No suspicious pulmonary nodule or mass. No focal airspace consolidation. No pleural effusion. Upper Abdomen: Stable 4.8 cm left renal cyst since abdomen CT 06/22/2021. No followup imaging is recommended. Musculoskeletal: No worrisome lytic or sclerotic osseous abnormality.  IMPRESSION: 1. Lung-RADS 1, negative. Continue annual screening with low-dose chest CT without contrast in 12 months. 2. 1.7 cm left thyroid nodule. Recommend thyroid US (ref: J Am Coll Radiol. 2015 Feb;12(2): 143-50). 3. Aortic Atherosclerosis (ICD10-I70.0) and Emphysema (ICD10-J43.9). Electronically Signed   By: Misty Stanley M.D.   On: 03/16/2022 06:37    Assessment/ Plan: 77 y.o. male   Thyroid nodule - Plan: TSH, T4, free, US THYROID  Check thyroid labs.  Ultrasound of thyroid ordered.  Discussed that he will be contacted in the next few days to schedule and he will come in today to get thyroid labs done.  No further questions.  He will follow-up as needed or as scheduled for routine care  Start time: 11:55am (no answer), 12:50pm (LVM); 12:57am End time: 1:02pm  Total time spent on patient care (including telephone call/ virtual visit): 5 minutes  Eric Cook, [place] [phone]

## 2022-03-17 LAB — TSH: TSH: 1.93 u[IU]/mL (ref 0.450–4.500)

## 2022-03-17 LAB — T4, FREE: Free T4: 1.6 ng/dL (ref 0.82–1.77)

## 2022-03-20 DIAGNOSIS — M47816 Spondylosis without myelopathy or radiculopathy, lumbar region: Secondary | ICD-10-CM | POA: Diagnosis not present

## 2022-03-20 DIAGNOSIS — M533 Sacrococcygeal disorders, not elsewhere classified: Secondary | ICD-10-CM | POA: Diagnosis not present

## 2022-03-23 ENCOUNTER — Ambulatory Visit (HOSPITAL_COMMUNITY)
Admission: RE | Admit: 2022-03-23 | Discharge: 2022-03-23 | Disposition: A | Discharge status: Home / Self Care | Payer: Medicare HMO | Source: Ambulatory Visit | Attending: Family Medicine | Admitting: Family Medicine

## 2022-03-23 ENCOUNTER — Other Ambulatory Visit: Payer: Self-pay | Admitting: Family Medicine

## 2022-03-23 DIAGNOSIS — E041 Nontoxic single thyroid nodule: Secondary | ICD-10-CM

## 2022-03-27 NOTE — Progress Notes (Unsigned)
IMPRESSION: Solid nodule in the left inferior thyroid (labeled 1, 1.7 cm) meets criteria (TI-RADS category 4) for tissue sampling. Recommend ultrasound-guided fine-needle aspiration.   The above is in keeping with the ACR TI-RADS recommendations - J Am Coll Radiol 2017;14:587-595.   Ruthann Cancer, MD   [facility]   [facility]     Electronically Signed   By: Ruthann Cancer M.D.   On: 03/23/2022 13:51

## 2022-03-28 DIAGNOSIS — M533 Sacrococcygeal disorders, not elsewhere classified: Secondary | ICD-10-CM | POA: Diagnosis not present

## 2022-04-17 ENCOUNTER — Ambulatory Visit
Admission: RE | Admit: 2022-04-17 | Discharge: 2022-04-17 | Disposition: A | Discharge status: Home / Self Care | Payer: Medicare HMO | Source: Ambulatory Visit | Attending: Family Medicine | Admitting: Family Medicine

## 2022-04-17 ENCOUNTER — Other Ambulatory Visit (HOSPITAL_COMMUNITY)
Admission: RE | Admit: 2022-04-17 | Discharge: 2022-04-17 | Disposition: A | Discharge status: Home / Self Care | Payer: Medicare HMO | Source: Ambulatory Visit | Attending: Family Medicine | Admitting: Family Medicine

## 2022-04-17 DIAGNOSIS — E041 Nontoxic single thyroid nodule: Secondary | ICD-10-CM | POA: Diagnosis not present

## 2022-04-19 LAB — CYTOLOGY - NON PAP

## 2022-04-24 ENCOUNTER — Other Ambulatory Visit: Payer: Self-pay | Admitting: Family Medicine

## 2022-04-24 DIAGNOSIS — K219 Gastro-esophageal reflux disease without esophagitis: Secondary | ICD-10-CM

## 2022-04-26 DIAGNOSIS — M47896 Other spondylosis, lumbar region: Secondary | ICD-10-CM | POA: Diagnosis not present

## 2022-04-26 DIAGNOSIS — M533 Sacrococcygeal disorders, not elsewhere classified: Secondary | ICD-10-CM | POA: Diagnosis not present

## 2022-05-21 ENCOUNTER — Ambulatory Visit (INDEPENDENT_AMBULATORY_CARE_PROVIDER_SITE_OTHER): Payer: Medicare HMO | Admitting: Family Medicine

## 2022-05-21 ENCOUNTER — Encounter: Payer: Self-pay | Admitting: Family Medicine

## 2022-05-21 ENCOUNTER — Ambulatory Visit: Payer: Medicare HMO | Admitting: Family Medicine

## 2022-05-21 VITALS — BP 137/75 | HR 71 | Temp 98.8°F | Ht 68.0 in | Wt 216.0 lb

## 2022-05-21 DIAGNOSIS — I10 Essential (primary) hypertension: Secondary | ICD-10-CM

## 2022-05-21 DIAGNOSIS — M545 Low back pain, unspecified: Secondary | ICD-10-CM

## 2022-05-21 DIAGNOSIS — E041 Nontoxic single thyroid nodule: Secondary | ICD-10-CM

## 2022-05-21 DIAGNOSIS — G8929 Other chronic pain: Secondary | ICD-10-CM

## 2022-05-21 NOTE — Patient Instructions (Signed)
I've reordered that biopsy of the thyroid and requested MD to do.

## 2022-05-21 NOTE — Progress Notes (Unsigned)
Subjective: CC:*** PCP: Raliegh Ip, DO AVW:[id number] Eric Cook is a 77 y.o. male presenting to clinic today for:  1. ***   ROS: Per HPI  No Known Allergies Past Medical History:  Diagnosis Date   Anxiety    HLD (hyperlipidemia)    Hyperlipidemia    Hypertension    Peptic ulcer    Seizures ([facility])    medication related    Current Outpatient Medications:    amLODipine (NORVASC) 5 MG tablet, Take 1 tablet (5 mg total) by mouth daily. STOP lisinopril/ HCTZ, Disp: 90 tablet, Rfl: 3   cimetidine (TAGAMET) 400 MG tablet, TAKE 1 TABLET TWICE DAILY AS NEEDED, Disp: 180 tablet, Rfl: 3   dicyclomine (BENTYL) 10 MG capsule, TAKE 20 MG (2 CAPSULES) 15 MINUTES BEFORE MEALS AS NEEDED, Disp: 90 capsule, Rfl: 3   fluticasone (FLONASE) 50 MCG/ACT nasal spray, USE 2 SPRAYS IN EACH NOSTRIL EVERY DAY, Disp: 48 g, Rfl: 1   fluticasone furoate-vilanterol (BREO ELLIPTA) 200-25 MCG/ACT AEPB, Inhale 1 puff into the lungs daily., Disp: 60 each, Rfl: 5   levETIRAcetam (KEPPRA XR) 500 MG 24 hr tablet, Take 1 tablet (500 mg total) by mouth at bedtime., Disp: 90 tablet, Rfl: 3   meloxicam (MOBIC) 15 MG tablet, Take 15 mg by mouth daily., Disp: , Rfl:    pantoprazole (PROTONIX) 40 MG tablet, Take 1 tablet (40 mg total) by mouth daily., Disp: 90 tablet, Rfl: 3   predniSONE (STERAPRED UNI-PAK 21 TAB) 10 MG (21) TBPK tablet, As directed x 6 days, Disp: 21 tablet, Rfl: 0   simvastatin (ZOCOR) 20 MG tablet, Take 1 tablet (20 mg total) by mouth daily at 6 PM., Disp: 90 tablet, Rfl: 3   tamsulosin (FLOMAX) 0.4 MG CAPS capsule, Take 1 capsule (0.4 mg total) by mouth daily., Disp: 90 capsule, Rfl: 3 Social History   Socioeconomic History   Marital status: Single    Spouse name: Not on file   Number of children: 1   Years of education: Not on file   Highest education level: Not on file  Occupational History   Occupation: Lobbyist    Comment: part time  Tobacco Use   Smoking status: Former     Packs/day: 1.00    Years: 54.00    Additional pack years: 0.00    Total pack years: 54.00    Types: Cigarettes    Quit date: 04/2019    Years since quitting: 3.0   Smokeless tobacco: Never  Vaping Use   Vaping Use: Never used  Substance and Sexual Activity   Alcohol use: Never   Drug use: Never   Sexual activity: Never  Other Topics Concern   Not on file  Social History Narrative   Lives alone   Right Handed   Drinks 2-3 cups of caffeine daily   Son in [place]   Social Determinants of Health   Financial Resource Strain: Low Risk  (06/20/2021)   Overall Financial Resource Strain (CARDIA)    Difficulty of Paying Living Expenses: Not hard at all  Food Insecurity: No Food Insecurity (06/20/2021)   Hunger Vital Sign    Worried About Running Out of Food in the Last Year: Never true    Ran Out of Food in the Last Year: Never true  Transportation Needs: No Transportation Needs (06/20/2021)   PRAPARE - Administrator, Civil Service (Medical): No    Lack of Transportation (Non-Medical): No  Physical Activity: Sufficiently Active (06/20/2021)  Exercise Vital Sign    Days of Exercise per Week: 7 days    Minutes of Exercise per Session: 30 min  Stress: No Stress Concern Present (06/20/2021)   [employer] of Occupational Health - Occupational Stress Questionnaire    Feeling of Stress : Only a little  Social Connections: Socially Isolated (06/20/2021)   Social Connection and Isolation Panel [NHANES]    Frequency of Communication with Friends and Family: More than three times a week    Frequency of Social Gatherings with Friends and Family: Once a week    Attends Religious Services: Never    Database administrator or Organizations: No    Attends Banker Meetings: Never    Marital Status: Divorced  Catering manager Violence: Not At Risk (06/20/2021)   Humiliation, Afraid, Rape, and Kick questionnaire    Fear of Current or Ex-Partner: No    Emotionally  Abused: No    Physically Abused: No    Sexually Abused: No   Family History  Problem Relation Age of Onset   Colon cancer Brother    Other Son        vertigo   Kidney Stones Son    Esophageal cancer Neg Hx    Pancreatic cancer Neg Hx    Stomach cancer Neg Hx     Objective: Office vital signs reviewed. Ht 5\' 8"  (1.727 m)   BMI 31.93 kg/m   Physical Examination:  General: Awake, alert, *** nourished, No acute distress HEENT: Normal    Neck: No masses palpated. No lymphadenopathy    Ears: Tympanic membranes intact, normal light reflex, no erythema, no bulging    Eyes: PERRLA, extraocular membranes intact, sclera ***    Nose: nasal turbinates moist, *** nasal discharge    Throat: moist mucus membranes, no erythema, *** tonsillar exudate.  Airway is patent Cardio: regular rate and rhythm, S1S2 heard, no murmurs appreciated Pulm: clear to auscultation bilaterally, no wheezes, rhonchi or rales; normal work of breathing on room air GI: soft, non-tender, non-distended, bowel sounds present x4, no hepatomegaly, no splenomegaly, no masses GU: external vaginal tissue ***, cervix ***, *** punctate lesions on cervix appreciated, *** discharge from cervical os, *** bleeding, *** cervical motion tenderness, *** abdominal/ adnexal masses Extremities: warm, well perfused, No edema, cyanosis or clubbing; +*** pulses bilaterally MSK: *** gait and *** station Skin: dry; intact; no rashes or lesions Neuro: *** Strength and light touch sensation grossly intact, *** DTRs ***/4  Assessment/ Plan: 77 y.o. male   ***  No orders of the defined types were placed in this encounter.  No orders of the defined types were placed in this encounter.    Raliegh Ip, DO [facility] [phone]

## 2022-05-23 DIAGNOSIS — M533 Sacrococcygeal disorders, not elsewhere classified: Secondary | ICD-10-CM | POA: Diagnosis not present

## 2022-05-23 DIAGNOSIS — M47816 Spondylosis without myelopathy or radiculopathy, lumbar region: Secondary | ICD-10-CM | POA: Diagnosis not present

## 2022-06-06 ENCOUNTER — Other Ambulatory Visit: Payer: Self-pay | Admitting: Family Medicine

## 2022-06-11 NOTE — Progress Notes (Signed)
Oley Balm, MD  Leodis Rains D PROCEDURE / BIOPSY REVIEW Date: 06/11/22  Requested Biopsy site: [facility] thyroid TR4  Reason for request: nondiag bx Imaging review: Best seen on [place]  Decision: Approved Imaging modality to perform: Ultrasound Schedule with: No sedation / Local anesthetic Schedule for: Any VIR  Additional comments:   Please contact me with questions, concerns, or if issue pertaining to this request arise.  Dayne Oley Balm, MD [facility]

## 2022-06-20 ENCOUNTER — Ambulatory Visit (HOSPITAL_COMMUNITY)
Admission: RE | Admit: 2022-06-20 | Discharge: 2022-06-20 | Disposition: A | Discharge status: Home / Self Care | Payer: Medicare HMO | Source: Ambulatory Visit | Attending: Family Medicine | Admitting: Family Medicine

## 2022-06-20 ENCOUNTER — Encounter (HOSPITAL_COMMUNITY): Payer: Self-pay

## 2022-06-20 DIAGNOSIS — E041 Nontoxic single thyroid nodule: Secondary | ICD-10-CM | POA: Insufficient documentation

## 2022-06-20 MED ORDER — LIDOCAINE HCL (PF) 2 % IJ SOLN
10.0000 mL | Freq: Once | INTRAMUSCULAR | Status: AC
  Administered 2022-06-20: 10 mL

## 2022-06-20 MED ORDER — LIDOCAINE HCL (PF) 2 % IJ SOLN
INTRAMUSCULAR | Status: AC
  Filled 2022-06-20: qty 10

## 2022-06-20 NOTE — Progress Notes (Signed)
PT tolerated thyroid biopsy procedure well today. Labs and afirma obtained and sent for pathology. PT ambulatory at discharge with no acute distress noted and verbalized understanding of discharge instructions. Samples taken to lab for processing at 1140 by Kristen from Ultrasound.

## 2022-06-21 LAB — CYTOLOGY - NON PAP

## 2022-06-22 ENCOUNTER — Ambulatory Visit (INDEPENDENT_AMBULATORY_CARE_PROVIDER_SITE_OTHER): Payer: Medicare HMO

## 2022-06-22 VITALS — Wt 216.0 lb

## 2022-06-22 DIAGNOSIS — Z0000 Encounter for general adult medical examination without abnormal findings: Secondary | ICD-10-CM

## 2022-06-22 NOTE — Progress Notes (Signed)
Subjective:   Eric Cook is a 77 y.o. male who presents for Medicare Annual/Subsequent preventive examination.  Review of Systems    I connected with  Genia Hotter on 06/22/22 by a audio enabled telemedicine application and verified that I am speaking with the correct person using two identifiers.  Patient Location: Home  Provider Location: Home Office  I discussed the limitations of evaluation and management by telemedicine. The patient expressed understanding and agreed to proceed.  Cardiac Risk Factors include: advanced age (>59men, >75 women);hypertension     Objective:    Today's Vitals   06/22/22 0945  Weight: 216 lb (98 kg)  PainSc: 3    Body mass index is 32.84 kg/m.     06/22/2022    9:52 AM 12/25/2021    1:21 PM 06/20/2021   10:49 AM 04/13/2021    7:50 AM 06/16/2020   12:02 PM 08/25/2019    4:30 PM  Advanced Directives  Does Patient Have a Medical Advance Directive? No Yes Yes No No No  Type of [employer] boy of [place];Living will     Copy of Healthcare Power of Attorney in Chart?   No - copy requested     Would patient like information on creating a medical advance directive? No - Patient declined    No - Patient declined No - Patient declined    Current Medications (verified) Outpatient Encounter Medications as of 06/22/2022  Medication Sig   amLODipine (NORVASC) 5 MG tablet Take 1 tablet (5 mg total) by mouth daily. STOP lisinopril/ HCTZ   cimetidine (TAGAMET) 400 MG tablet TAKE 1 TABLET TWICE DAILY AS NEEDED   dicyclomine (BENTYL) 10 MG capsule TAKE 20 MG (2 CAPSULES) 15 MINUTES BEFORE MEALS AS NEEDED   fluticasone (FLONASE) 50 MCG/ACT nasal spray USE 2 SPRAYS IN EACH NOSTRIL EVERY DAY   fluticasone furoate-vilanterol (BREO ELLIPTA) 200-25 MCG/ACT AEPB Inhale 1 puff into the lungs daily.   levETIRAcetam (KEPPRA XR) 500 MG 24 hr tablet Take 1 tablet (500 mg total) by mouth at bedtime.   meloxicam (MOBIC) 15 MG tablet Take 15 mg  by mouth daily.   pantoprazole (PROTONIX) 40 MG tablet Take 1 tablet (40 mg total) by mouth daily.   predniSONE (STERAPRED UNI-PAK 21 TAB) 10 MG (21) TBPK tablet As directed x 6 days   simvastatin (ZOCOR) 20 MG tablet Take 1 tablet (20 mg total) by mouth daily at 6 PM.   tamsulosin (FLOMAX) 0.4 MG CAPS capsule TAKE 1 CAPSULE (0.4 MG TOTAL) BY MOUTH DAILY.   No facility-administered encounter medications on file as of 06/22/2022.    Allergies (verified) Patient has no known allergies.   History: Past Medical History:  Diagnosis Date   Anxiety    HLD (hyperlipidemia)    Hyperlipidemia    Hypertension    Peptic ulcer    Seizures (HCC)    medication related   Past Surgical History:  Procedure Laterality Date   LUMBAR DISC SURGERY     L3-L4   UPPER GASTROINTESTINAL ENDOSCOPY     Family History  Problem Relation Age of Onset   Colon cancer Brother    Other Son        vertigo   Kidney Stones Son    Esophageal cancer Neg Hx    Pancreatic cancer Neg Hx    Stomach cancer Neg Hx    Social History   Socioeconomic History   Marital status: Single    Spouse name: Not  on file   Number of children: 1   Years of education: Not on file   Highest education level: Not on file  Occupational History   Occupation: Lobbyist    Comment: part time  Tobacco Use   Smoking status: Former    Packs/day: 1.00    Years: 54.00    Additional pack years: 0.00    Total pack years: 54.00    Types: Cigarettes    Quit date: 04/2019    Years since quitting: 3.1   Smokeless tobacco: Never  Vaping Use   Vaping Use: Never used  Substance and Sexual Activity   Alcohol use: Never   Drug use: Never   Sexual activity: Never  Other Topics Concern   Not on file  Social History Narrative   Lives alone   Right Handed   Drinks 2-3 cups of caffeine daily   Son in [place]   Social Determinants of Health   Financial Resource Strain: Low Risk  (06/22/2022)   Overall Financial  Resource Strain (CARDIA)    Difficulty of Paying Living Expenses: Not hard at all  Food Insecurity: No Food Insecurity (06/22/2022)   Hunger Vital Sign    Worried About Running Out of Food in the Last Year: Never true    Ran Out of Food in the Last Year: Never true  Transportation Needs: No Transportation Needs (06/22/2022)   PRAPARE - Administrator, Civil Service (Medical): No    Lack of Transportation (Non-Medical): No  Physical Activity: Sufficiently Active (06/22/2022)   Exercise Vital Sign    Days of Exercise per Week: 7 days    Minutes of Exercise per Session: 30 min  Stress: No Stress Concern Present (06/22/2022)   [employer] of Occupational Health - Occupational Stress Questionnaire    Feeling of Stress : Not at all  Social Connections: Socially Integrated (06/22/2022)   Social Connection and Isolation Panel [NHANES]    Frequency of Communication with Friends and Family: More than three times a week    Frequency of Social Gatherings with Friends and Family: More than three times a week    Attends Religious Services: More than 4 times per year    Active Member of [employer] or Organizations: Yes    Attends Engineer, structural: More than 4 times per year    Marital Status: Married    Tobacco Counseling Counseling given: Yes   Clinical Intake:  Pre-visit preparation completed: Yes  Pain : 0-10 Pain Score: 3  Pain Type: Chronic pain Pain Location: Back Pain Orientation: Lower Pain Descriptors / Indicators: Aching Pain Frequency: Intermittent     BMI - recorded: 32.84 Nutritional Status: BMI > 30  Obese Nutritional Risks: None Diabetes: No  How often do you need to have someone help you when you read instructions, pamphlets, or other written materials from your doctor or pharmacy?: 1 - Never  Diabetic?No   Interpreter Needed?: No  Information entered by :: Fredirick Maudlin   Activities of Daily Living    06/22/2022    9:54 AM  In  your present state of health, do you have any difficulty performing the following activities:  Hearing? 0  Vision? 0  Difficulty concentrating or making decisions? 0  Walking or climbing stairs? 0  Dressing or bathing? 0  Doing errands, shopping? 0  Preparing Food and eating ? N  Using the Toilet? N  In the past six months, have you accidently leaked urine? N  Do  you have problems with loss of bowel control? N  Managing your Medications? N  Managing your Finances? N  Housekeeping or managing your Housekeeping? N    Patient Care Team: Raliegh Ip, DO as PCP - General (Family Medicine) York Spaniel, MD (Inactive) as Consulting Physician (Neurology) Crist Fat, MD as Attending Physician (Urology) Hilarie Fredrickson, MD as Consulting Physician (Gastroenterology) Ihor Austin, NP as Nurse Practitioner (Neurology) Hunsucker, Lesia Sago, MD as Consulting Physician (Pulmonary Disease)  Indicate any recent Medical Services you may have received from other than [facility] providers in the past year (date may be approximate).     Assessment:   This is a routine wellness examination for Ronel.  Hearing/Vision screen Hearing Screening - Comments:: Denies hearing difficulties   Vision Screening - Comments:: Wears rx glasses - up to date with routine eye exams with [facility]  Dietary issues and exercise activities discussed: Current Exercise Habits: Home exercise routine, Type of exercise: walking (yard work), Time (Minutes): 60, Frequency (Times/Week): 5, Weekly Exercise (Minutes/Week): 300, Exercise limited by: orthopedic condition(s)   Goals Addressed   None   Depression Screen    06/22/2022    9:49 AM 05/21/2022    4:05 PM 01/17/2022    2:19 PM 10/18/2021   10:26 AM 07/17/2021    1:13 PM 06/20/2021   10:45 AM 04/20/2021    3:09 PM  PHQ 2/9 Scores  PHQ - 2 Score 0 0 0 0  0 0  PHQ- 9 Score 0 4       Exception Documentation     Patient refusal       Fall Risk    06/22/2022    9:54 AM 05/21/2022    4:05 PM 01/17/2022    2:19 PM 10/18/2021   10:25 AM 07/17/2021    1:13 PM  Fall Risk   Falls in the past year? 0 0 0 0 0  Number falls in past yr: 0 0     Injury with Fall? 0 0     Risk for fall due to : No Fall Risks No Fall Risks     Follow up Falls prevention discussed;Falls evaluation completed Education provided       FALL RISK PREVENTION PERTAINING TO THE HOME:  Any stairs in or around the home? Yes  If so, are there any without handrails? No  Home free of loose throw rugs in walkways, pet beds, electrical cords, etc? No  Adequate lighting in your home to reduce risk of falls? Yes   ASSISTIVE DEVICES UTILIZED TO PREVENT FALLS:  Life alert? No  Use of a cane, walker or w/c? No  Grab bars in the bathroom? Yes  Shower chair or bench in shower? No  Elevated toilet seat or a handicapped toilet? No   TIMED UP AND GO:  Was the test performed?  NO televisit .    Cognitive Function:        06/22/2022    9:49 AM 06/20/2021   10:50 AM 06/16/2020   12:03 PM  6CIT Screen  What Year? 0 points 0 points 0 points  What month? 0 points 0 points 0 points  What time? 0 points 0 points 0 points  Count back from 20 0 points 0 points 0 points  Months in reverse 0 points 2 points 2 points  Repeat phrase 0 points 4 points 2 points  Total Score 0 points 6 points 4 points    Immunizations Immunization History  Administered Date(s) Administered   COVID-19, mRNA, vaccine(Comirnaty)12 years and older 01/17/2022   Fluad Quad(high Dose 65+) 10/14/2019, 11/03/2020, 10/18/2021   Moderna Sars-Covid-2 Vaccination 03/18/2019, 04/15/2019, 12/01/2019   PNEUMOCOCCAL CONJUGATE-20 11/03/2020   Pneumococcal Conjugate-13 10/14/2019    TDAP status: Patient wishes to receive this vaccine today after disclosing that insurance does not cover the vaccine as a preventative vaccine.   Flu Vaccine status: Up to date  Pneumococcal vaccine status: Up to  date  Covid-19 vaccine status: Completed vaccines  Qualifies for Shingles Vaccine?  Patient Declined    Zostavax completed No   Shingrix Completed?: No.    Education has been provided regarding the importance of this vaccine. Patient has been advised to call insurance company to determine out of pocket expense if they have not yet received this vaccine. Advised may also receive vaccine at local pharmacy or Health Dept. Verbalized acceptance and understanding.  Screening Tests Health Maintenance  Topic Date Due   COVID-19 Vaccine (5 - 2023-24 season) 03/14/2022   Zoster Vaccines- Shingrix (1 of 2) 08/20/2022 (Originally 03/11/1964)   INFLUENZA VACCINE  08/23/2022   Lung Cancer Screening  03/16/2023   Medicare Annual Wellness (AWV)  06/22/2023   Pneumonia Vaccine 97+ Years old  Completed   Hepatitis C Screening  Completed   HPV VACCINES  Aged Out   DTaP/Tdap/Td  Discontinued    Health Maintenance  Health Maintenance Due  Topic Date Due   COVID-19 Vaccine (5 - 2023-24 season) 03/14/2022    Colorectal cancer screening: Type of screening: FOBT/FIT. Completed 08/03/2021. Repeat every 1 years  Lung Cancer Screening: (Low Dose CT Chest recommended if Age 51-80 years, 30 pack-year currently smoking OR have quit w/in 15years.) does qualify.   Lung Cancer Screening Referral: yes  Additional Screening:  Hepatitis C Screening: does qualify; Completed 05/20/20  Vision Screening: Recommended annual ophthalmology exams for early detection of glaucoma and other disorders of the eye. Is the patient up to date with their annual eye exam?  Yes  Who is the provider or what is the name of the office in which the patient attends annual eye exams? [facility] If pt is not established with a provider, would they like to be referred to a provider to establish care? No .   Dental Screening: Recommended annual dental exams for proper oral hygiene  Community Resource Referral / Chronic  Care Management: CRR required this visit?  No   CCM required this visit?  No      Plan:     I have personally reviewed and noted the following in the patient's chart:   Medical and social history Use of alcohol, tobacco or illicit drugs  Current medications and supplements including opioid prescriptions. Patient is not currently taking opioid prescriptions. Functional ability and status Nutritional status Physical activity Advanced directives List of other physicians Hospitalizations, surgeries, and ER visits in previous 12 months Vitals Screenings to include cognitive, depression, and falls Referrals and appointments  In addition, I have reviewed and discussed with patient certain preventive protocols, quality metrics, and best practice recommendations. A written personalized care plan for preventive services as well as general preventive health recommendations were provided to patient.     Annabell Sabal, CMA   06/22/2022   Nurse Notes: Patient like the results of labs that he had done 06/20/22, pt said it okay it okay to let results on his voicemail.

## 2022-06-22 NOTE — Patient Instructions (Signed)
Eric Cook , Thank you for taking time to come for your Medicare Wellness Visit. I appreciate your ongoing commitment to your health goals. Please review the following plan we discussed and let me know if I can assist you in the future.   These are the goals we discussed:  Goals      Exercise 3x per week (30 min per time)     He wants to stay active and healthy; Get appetite back (has lost smell and taste since hospital visit in 08/2019 - possible seizure) 06/20/2021 - this is some better - he still has upset stomach and fecal urgency        This is a list of the screening recommended for you and due dates:  Health Maintenance  Topic Date Due   COVID-19 Vaccine (5 - 2023-24 season) 03/14/2022   Zoster (Shingles) Vaccine (1 of 2) 08/20/2022*   Flu Shot  08/23/2022   Screening for Lung Cancer  03/16/2023   Medicare Annual Wellness Visit  06/22/2023   Pneumonia Vaccine  Completed   Hepatitis C Screening  Completed   HPV Vaccine  Aged Out   DTaP/Tdap/Td vaccine  Discontinued  *Topic was postponed. The date shown is not the original due date.    Advanced directives: Advance directive discussed with you today. Even though you declined this today, please call our office should you change your mind, and we can give you the proper paperwork for you to fill out.   Conditions/risks identified: Aim for 30 minutes of exercise or brisk walking, 6-8 glasses of water, and 5 servings of fruits and vegetables each day.   Next appointment: Follow up in one year for your annual wellness visit. 06/22/23  Preventive Care 65 Years and Older, Male  Preventive care refers to lifestyle choices and visits with your health care provider that can promote health and wellness. What does preventive care include? A yearly physical exam. This is also called an annual well check. Dental exams once or twice a year. Routine eye exams. Ask your health care provider how often you should have your eyes  checked. Personal lifestyle choices, including: Daily care of your teeth and gums. Regular physical activity. Eating a healthy diet. Avoiding tobacco and drug use. Limiting alcohol use. Practicing safe sex. Taking low doses of aspirin every day. Taking vitamin and mineral supplements as recommended by your health care provider. What happens during an annual well check? The services and screenings done by your health care provider during your annual well check will depend on your age, overall health, lifestyle risk factors, and family history of disease. Counseling  Your health care provider may ask you questions about your: Alcohol use. Tobacco use. Drug use. Emotional well-being. Home and relationship well-being. Sexual activity. Eating habits. History of falls. Memory and ability to understand (cognition). Work and work Astronomer. Screening  You may have the following tests or measurements: Height, weight, and BMI. Blood pressure. Lipid and cholesterol levels. These may be checked every 5 years, or more frequently if you are over 76 years old. Skin check. Lung cancer screening. You may have this screening every year starting at age 76 if you have a 30-pack-year history of smoking and currently smoke or have quit within the past 15 years. Fecal occult blood test (FOBT) of the stool. You may have this test every year starting at age 76. Flexible sigmoidoscopy or colonoscopy. You may have a sigmoidoscopy every 5 years or a colonoscopy every 10 years starting at  age 76. Prostate cancer screening. Recommendations will vary depending on your family history and other risks. Hepatitis C blood test. Hepatitis B blood test. Sexually transmitted disease (STD) testing. Diabetes screening. This is done by checking your blood sugar (glucose) after you have not eaten for a while (fasting). You may have this done every 1-3 years. Abdominal aortic aneurysm (AAA) screening. You may need this  if you are a current or former smoker. Osteoporosis. You may be screened starting at age 76 if you are at high risk. Talk with your health care provider about your test results, treatment options, and if necessary, the need for more tests. Vaccines  Your health care provider may recommend certain vaccines, such as: Influenza vaccine. This is recommended every year. Tetanus, diphtheria, and acellular pertussis (Tdap, Td) vaccine. You may need a Td booster every 10 years. Zoster vaccine. You may need this after age 76. Pneumococcal 13-valent conjugate (PCV13) vaccine. One dose is recommended after age 76. Pneumococcal polysaccharide (PPSV23) vaccine. One dose is recommended after age 76. Talk to your health care provider about which screenings and vaccines you need and how often you need them. This information is not intended to replace advice given to you by your health care provider. Make sure you discuss any questions you have with your health care provider. Document Released: 02/04/2015 Document Revised: 09/28/2015 Document Reviewed: 11/09/2014 Elsevier Interactive Patient Education  2017 [employer].  Fall Prevention in the Home Falls can cause injuries. They can happen to people of all ages. There are many things you can do to make your home safe and to help prevent falls. What can I do on the outside of my home? Regularly fix the edges of walkways and driveways and fix any cracks. Remove anything that might make you trip as you walk through a door, such as a raised step or threshold. Trim any bushes or trees on the path to your home. Use bright outdoor lighting. Clear any walking paths of anything that might make someone trip, such as rocks or tools. Regularly check to see if handrails are loose or broken. Make sure that both sides of any steps have handrails. Any raised decks and porches should have guardrails on the edges. Have any leaves, snow, or ice cleared regularly. Use sand  or salt on walking paths during winter. Clean up any spills in your garage right away. This includes oil or grease spills. What can I do in the bathroom? Use night lights. Install grab bars by the toilet and in the tub and shower. Do not use towel bars as grab bars. Use non-skid mats or decals in the tub or shower. If you need to sit down in the shower, use a plastic, non-slip stool. Keep the floor dry. Clean up any water that spills on the floor as soon as it happens. Remove soap buildup in the tub or shower regularly. Attach bath mats securely with double-sided non-slip rug tape. Do not have throw rugs and other things on the floor that can make you trip. What can I do in the bedroom? Use night lights. Make sure that you have a light by your bed that is easy to reach. Do not use any sheets or blankets that are too big for your bed. They should not hang down onto the floor. Have a firm chair that has side arms. You can use this for support while you get dressed. Do not have throw rugs and other things on the floor that can make  you trip. What can I do in the kitchen? Clean up any spills right away. Avoid walking on wet floors. Keep items that you use a lot in easy-to-reach places. If you need to reach something above you, use a strong step stool that has a grab bar. Keep electrical cords out of the way. Do not use floor polish or wax that makes floors slippery. If you must use wax, use non-skid floor wax. Do not have throw rugs and other things on the floor that can make you trip. What can I do with my stairs? Do not leave any items on the stairs. Make sure that there are handrails on both sides of the stairs and use them. Fix handrails that are broken or loose. Make sure that handrails are as long as the stairways. Check any carpeting to make sure that it is firmly attached to the stairs. Fix any carpet that is loose or worn. Avoid having throw rugs at the top or bottom of the stairs.  If you do have throw rugs, attach them to the floor with carpet tape. Make sure that you have a light switch at the top of the stairs and the bottom of the stairs. If you do not have them, ask someone to add them for you. What else can I do to help prevent falls? Wear shoes that: Do not have high heels. Have rubber bottoms. Are comfortable and fit you well. Are closed at the toe. Do not wear sandals. If you use a stepladder: Make sure that it is fully opened. Do not climb a closed stepladder. Make sure that both sides of the stepladder are locked into place. Ask someone to hold it for you, if possible. Clearly mark and make sure that you can see: Any grab bars or handrails. First and last steps. Where the edge of each step is. Use tools that help you move around (mobility aids) if they are needed. These include: Canes. Walkers. Scooters. Crutches. Turn on the lights when you go into a dark area. Replace any light bulbs as soon as they burn out. Set up your furniture so you have a clear path. Avoid moving your furniture around. If any of your floors are uneven, fix them. If there are any pets around you, be aware of where they are. Review your medicines with your doctor. Some medicines can make you feel dizzy. This can increase your chance of falling. Ask your doctor what other things that you can do to help prevent falls. This information is not intended to replace advice given to you by your health care provider. Make sure you discuss any questions you have with your health care provider. Document Released: 11/04/2008 Document Revised: 06/16/2015 Document Reviewed: 02/12/2014 Elsevier Interactive Patient Education  2017 [employer].

## 2022-06-24 ENCOUNTER — Encounter: Payer: Self-pay | Admitting: Family Medicine

## 2022-07-04 ENCOUNTER — Other Ambulatory Visit: Payer: Self-pay | Admitting: Internal Medicine

## 2022-07-17 DIAGNOSIS — L720 Epidermal cyst: Secondary | ICD-10-CM | POA: Diagnosis not present

## 2022-07-17 DIAGNOSIS — L905 Scar conditions and fibrosis of skin: Secondary | ICD-10-CM | POA: Diagnosis not present

## 2022-08-03 ENCOUNTER — Encounter (HOSPITAL_COMMUNITY): Payer: Self-pay

## 2022-08-03 ENCOUNTER — Emergency Department (HOSPITAL_COMMUNITY): Payer: Medicare HMO

## 2022-08-03 ENCOUNTER — Observation Stay (HOSPITAL_COMMUNITY)
Admission: EM | Admit: 2022-08-03 | Discharge: 2022-08-04 | Disposition: A | Discharge status: Home / Self Care | Payer: Medicare HMO | Attending: Internal Medicine | Admitting: Internal Medicine

## 2022-08-03 ENCOUNTER — Other Ambulatory Visit: Payer: Self-pay

## 2022-08-03 ENCOUNTER — Other Ambulatory Visit (HOSPITAL_COMMUNITY): Payer: Medicare HMO

## 2022-08-03 DIAGNOSIS — Z87891 Personal history of nicotine dependence: Secondary | ICD-10-CM | POA: Insufficient documentation

## 2022-08-03 DIAGNOSIS — R110 Nausea: Secondary | ICD-10-CM | POA: Diagnosis not present

## 2022-08-03 DIAGNOSIS — R0609 Other forms of dyspnea: Principal | ICD-10-CM

## 2022-08-03 DIAGNOSIS — I110 Hypertensive heart disease with heart failure: Secondary | ICD-10-CM | POA: Insufficient documentation

## 2022-08-03 DIAGNOSIS — J449 Chronic obstructive pulmonary disease, unspecified: Secondary | ICD-10-CM | POA: Diagnosis present

## 2022-08-03 DIAGNOSIS — Z79899 Other long term (current) drug therapy: Secondary | ICD-10-CM | POA: Insufficient documentation

## 2022-08-03 DIAGNOSIS — R918 Other nonspecific abnormal finding of lung field: Secondary | ICD-10-CM | POA: Diagnosis not present

## 2022-08-03 DIAGNOSIS — E785 Hyperlipidemia, unspecified: Secondary | ICD-10-CM

## 2022-08-03 DIAGNOSIS — I10 Essential (primary) hypertension: Secondary | ICD-10-CM | POA: Diagnosis present

## 2022-08-03 DIAGNOSIS — R0602 Shortness of breath: Secondary | ICD-10-CM | POA: Diagnosis not present

## 2022-08-03 DIAGNOSIS — J9811 Atelectasis: Secondary | ICD-10-CM | POA: Diagnosis not present

## 2022-08-03 DIAGNOSIS — E876 Hypokalemia: Secondary | ICD-10-CM | POA: Diagnosis not present

## 2022-08-03 DIAGNOSIS — Z1152 Encounter for screening for COVID-19: Secondary | ICD-10-CM | POA: Insufficient documentation

## 2022-08-03 DIAGNOSIS — I5032 Chronic diastolic (congestive) heart failure: Secondary | ICD-10-CM | POA: Insufficient documentation

## 2022-08-03 DIAGNOSIS — G40219 Localization-related (focal) (partial) symptomatic epilepsy and epileptic syndromes with complex partial seizures, intractable, without status epilepticus: Secondary | ICD-10-CM | POA: Diagnosis present

## 2022-08-03 DIAGNOSIS — I5033 Acute on chronic diastolic (congestive) heart failure: Secondary | ICD-10-CM | POA: Diagnosis not present

## 2022-08-03 DIAGNOSIS — R531 Weakness: Secondary | ICD-10-CM | POA: Diagnosis not present

## 2022-08-03 DIAGNOSIS — J432 Centrilobular emphysema: Secondary | ICD-10-CM | POA: Diagnosis not present

## 2022-08-03 DIAGNOSIS — N400 Enlarged prostate without lower urinary tract symptoms: Secondary | ICD-10-CM | POA: Diagnosis present

## 2022-08-03 DIAGNOSIS — I5031 Acute diastolic (congestive) heart failure: Secondary | ICD-10-CM | POA: Diagnosis not present

## 2022-08-03 DIAGNOSIS — I509 Heart failure, unspecified: Secondary | ICD-10-CM

## 2022-08-03 LAB — TROPONIN I (HIGH SENSITIVITY)
Troponin I (High Sensitivity): 6 ng/L (ref <18)
Troponin I (High Sensitivity): 6 ng/L (ref <18)

## 2022-08-03 LAB — I-STAT VENOUS BLOOD GAS, ED
Acid-Base Excess: 4 mmol/L — ABNORMAL HIGH (ref 0.0–2.0)
Bicarbonate: 26.3 mmol/L (ref 20.0–28.0)
Calcium, Ion: 1.01 mmol/L — ABNORMAL LOW (ref 1.15–1.40)
HCT: 40 % (ref 39.0–52.0)
Hemoglobin: 13.6 g/dL (ref 13.0–17.0)
O2 Saturation: 97 %
Potassium: 3.1 mmol/L — ABNORMAL LOW (ref 3.5–5.1)
Sodium: 139 mmol/L (ref 135–145)
TCO2: 27 mmol/L (ref 22–32)
pCO2, Ven: 31.8 mmHg — ABNORMAL LOW (ref 44–60)
pH, Ven: 7.526 — ABNORMAL HIGH (ref 7.25–7.43)
pO2, Ven: 75 mmHg — ABNORMAL HIGH (ref 32–45)

## 2022-08-03 LAB — CBC WITH DIFFERENTIAL/PLATELET
Abs Immature Granulocytes: 0.02 10*3/uL (ref 0.00–0.07)
Basophils Absolute: 0.1 10*3/uL (ref 0.0–0.1)
Basophils Relative: 1 %
Eosinophils Absolute: 0.2 10*3/uL (ref 0.0–0.5)
Eosinophils Relative: 2 %
HCT: 39.3 % (ref 39.0–52.0)
Hemoglobin: 13.5 g/dL (ref 13.0–17.0)
Immature Granulocytes: 0 %
Lymphocytes Relative: 14 %
Lymphs Abs: 1.1 10*3/uL (ref 0.7–4.0)
MCH: 31.4 pg (ref 26.0–34.0)
MCHC: 34.4 g/dL (ref 30.0–36.0)
MCV: 91.4 fL (ref 80.0–100.0)
Monocytes Absolute: 1 10*3/uL (ref 0.1–1.0)
Monocytes Relative: 13 %
Neutro Abs: 5.4 10*3/uL (ref 1.7–7.7)
Neutrophils Relative %: 70 %
Platelets: 294 10*3/uL (ref 150–400)
RBC: 4.3 MIL/uL (ref 4.22–5.81)
RDW: 12.1 % (ref 11.5–15.5)
WBC: 7.6 10*3/uL (ref 4.0–10.5)
nRBC: 0 % (ref 0.0–0.2)

## 2022-08-03 LAB — COMPREHENSIVE METABOLIC PANEL
ALT: 17 U/L (ref 0–44)
AST: 26 U/L (ref 15–41)
Albumin: 3.6 g/dL (ref 3.5–5.0)
Alkaline Phosphatase: 85 U/L (ref 38–126)
Anion gap: 12 (ref 5–15)
BUN: 12 mg/dL (ref 8–23)
CO2: 24 mmol/L (ref 22–32)
Calcium: 8.8 mg/dL — ABNORMAL LOW (ref 8.9–10.3)
Chloride: 102 mmol/L (ref 98–111)
Creatinine, Ser: 1.08 mg/dL (ref 0.61–1.24)
GFR, Estimated: >60 mL/min (ref >60)
Glucose, Bld: 113 mg/dL — ABNORMAL HIGH (ref 70–99)
Potassium: 2.9 mmol/L — ABNORMAL LOW (ref 3.5–5.1)
Sodium: 138 mmol/L (ref 135–145)
Total Bilirubin: 0.8 mg/dL (ref 0.3–1.2)
Total Protein: 6.9 g/dL (ref 6.5–8.1)

## 2022-08-03 LAB — URINALYSIS, ROUTINE W REFLEX MICROSCOPIC
Bilirubin Urine: NEGATIVE
Glucose, UA: NEGATIVE mg/dL
Hgb urine dipstick: NEGATIVE
Ketones, ur: NEGATIVE mg/dL
Leukocytes,Ua: NEGATIVE
Nitrite: NEGATIVE
Protein, ur: NEGATIVE mg/dL
Specific Gravity, Urine: 1.012 (ref 1.005–1.030)
pH: 9 — ABNORMAL HIGH (ref 5.0–8.0)

## 2022-08-03 LAB — BRAIN NATRIURETIC PEPTIDE: B Natriuretic Peptide: 98.5 pg/mL (ref 0.0–100.0)

## 2022-08-03 LAB — MAGNESIUM: Magnesium: 1.9 mg/dL (ref 1.7–2.4)

## 2022-08-03 LAB — SARS CORONAVIRUS 2 BY RT PCR: SARS Coronavirus 2 by RT PCR: NEGATIVE

## 2022-08-03 LAB — LIPASE, BLOOD: Lipase: 27 U/L (ref 11–51)

## 2022-08-03 MED ORDER — PANTOPRAZOLE SODIUM 40 MG PO TBEC
40.0000 mg | DELAYED_RELEASE_TABLET | Freq: Every day | ORAL | Status: DC
  Administered 2022-08-03 – 2022-08-04 (×2): 40 mg via ORAL
  Filled 2022-08-03 (×2): qty 1

## 2022-08-03 MED ORDER — IPRATROPIUM-ALBUTEROL 0.5-2.5 (3) MG/3ML IN SOLN
3.0000 mL | Freq: Once | RESPIRATORY_TRACT | Status: AC
  Administered 2022-08-03: 3 mL via RESPIRATORY_TRACT
  Filled 2022-08-03: qty 3

## 2022-08-03 MED ORDER — LISINOPRIL 10 MG PO TABS
10.0000 mg | ORAL_TABLET | Freq: Every day | ORAL | Status: DC
  Administered 2022-08-03 – 2022-08-04 (×2): 10 mg via ORAL
  Filled 2022-08-03 (×2): qty 1

## 2022-08-03 MED ORDER — TAMSULOSIN HCL 0.4 MG PO CAPS
0.4000 mg | ORAL_CAPSULE | Freq: Every day | ORAL | Status: DC
  Administered 2022-08-03 – 2022-08-04 (×2): 0.4 mg via ORAL
  Filled 2022-08-03 (×2): qty 1

## 2022-08-03 MED ORDER — POTASSIUM CHLORIDE 10 MEQ/100ML IV SOLN
10.0000 meq | INTRAVENOUS | Status: AC
  Administered 2022-08-03 (×3): 10 meq via INTRAVENOUS
  Filled 2022-08-03 (×3): qty 100

## 2022-08-03 MED ORDER — FAMOTIDINE 20 MG PO TABS
10.0000 mg | ORAL_TABLET | Freq: Every day | ORAL | Status: DC
  Administered 2022-08-03 – 2022-08-04 (×2): 10 mg via ORAL
  Filled 2022-08-03 (×2): qty 1

## 2022-08-03 MED ORDER — FUROSEMIDE 10 MG/ML IJ SOLN
40.0000 mg | Freq: Once | INTRAMUSCULAR | Status: AC
  Administered 2022-08-03: 40 mg via INTRAVENOUS
  Filled 2022-08-03: qty 4

## 2022-08-03 MED ORDER — POTASSIUM CHLORIDE CRYS ER 20 MEQ PO TBCR
40.0000 meq | EXTENDED_RELEASE_TABLET | Freq: Once | ORAL | Status: AC
  Administered 2022-08-03: 40 meq via ORAL
  Filled 2022-08-03: qty 2

## 2022-08-03 MED ORDER — AMLODIPINE BESYLATE 5 MG PO TABS
5.0000 mg | ORAL_TABLET | Freq: Every day | ORAL | Status: DC
  Administered 2022-08-04: 5 mg via ORAL
  Filled 2022-08-03: qty 1

## 2022-08-03 MED ORDER — ALBUTEROL SULFATE (2.5 MG/3ML) 0.083% IN NEBU: 2.5000 mg | INHALATION_SOLUTION | RESPIRATORY_TRACT | Status: DC | PRN

## 2022-08-03 MED ORDER — ENOXAPARIN SODIUM 40 MG/0.4ML IJ SOSY
40.0000 mg | PREFILLED_SYRINGE | INTRAMUSCULAR | Status: DC
  Administered 2022-08-03: 40 mg via SUBCUTANEOUS
  Filled 2022-08-03: qty 0.4

## 2022-08-03 MED ORDER — FLUTICASONE FUROATE-VILANTEROL 100-25 MCG/ACT IN AEPB
1.0000 | INHALATION_SPRAY | Freq: Every day | RESPIRATORY_TRACT | Status: DC
  Administered 2022-08-03 – 2022-08-04 (×2): 1 via RESPIRATORY_TRACT
  Filled 2022-08-03: qty 28

## 2022-08-03 MED ORDER — ACETAMINOPHEN 500 MG PO TABS
1000.0000 mg | ORAL_TABLET | Freq: Once | ORAL | Status: AC
  Administered 2022-08-03: 1000 mg via ORAL
  Filled 2022-08-03: qty 2

## 2022-08-03 MED ORDER — IOHEXOL 350 MG/ML SOLN
75.0000 mL | Freq: Once | INTRAVENOUS | Status: AC | PRN
  Administered 2022-08-03: 75 mL via INTRAVENOUS

## 2022-08-03 MED ORDER — LEVETIRACETAM ER 500 MG PO TB24
500.0000 mg | ORAL_TABLET | Freq: Every day | ORAL | Status: DC
  Administered 2022-08-03: 500 mg via ORAL
  Filled 2022-08-03 (×2): qty 1

## 2022-08-03 MED ORDER — IPRATROPIUM-ALBUTEROL 0.5-2.5 (3) MG/3ML IN SOLN
3.0000 mL | Freq: Four times a day (QID) | RESPIRATORY_TRACT | Status: DC
  Administered 2022-08-03 (×2): 3 mL via RESPIRATORY_TRACT
  Filled 2022-08-03 (×2): qty 3

## 2022-08-03 MED ORDER — DICYCLOMINE HCL 10 MG PO CAPS
10.0000 mg | ORAL_CAPSULE | Freq: Three times a day (TID) | ORAL | Status: DC
  Administered 2022-08-03 – 2022-08-04 (×3): 10 mg via ORAL
  Filled 2022-08-03 (×3): qty 1

## 2022-08-03 MED ORDER — IPRATROPIUM-ALBUTEROL 0.5-2.5 (3) MG/3ML IN SOLN
3.0000 mL | Freq: Two times a day (BID) | RESPIRATORY_TRACT | Status: DC
  Filled 2022-08-03: qty 3

## 2022-08-03 NOTE — ED Provider Notes (Signed)
Emergency Department Provider Note   I have reviewed the triage vital signs and the nursing notes.   HISTORY  Chief Complaint Weakness and Nausea   HPI Eric Cook is a 77 y.o. male past history of hypertension, hyperlipidemia, seizures, and remote smoking history presents to the emergency department with weakness/fatigue along with exertional shortness of breath over the past 2 to 3 days.  Patient states that he has had progressively worsening symptoms over that time with worsening shortness of breath this morning.  Family, at bedside, states that he was so winded that he could hardly walk across the room this morning which is very unusual for him.  He has no documented history of COPD but does have a remote smoking history.  He is not experiencing any chest discomfort with this.  He has some mild suprapubic abdominal pain which he states is typical for him.  His appetite has been poor.  He has not had any fevers.  No known seizure activity.  No falls or head injury.  He states when he is up and walking he feels very short of breath and will feel lightheaded like he is going to pass out.    Past Medical History:  Diagnosis Date   Anxiety    HLD (hyperlipidemia)    Hyperlipidemia    Hypertension    Peptic ulcer    Seizures (HCC)    medication related    Review of Systems  Constitutional: No fever/chills. Positive weakness and poor appetite.  Cardiovascular: Denies chest pain. Respiratory: Positive exertional SOB. Gastrointestinal: No abdominal pain.  No nausea, no vomiting.  No diarrhea.  No constipation. Genitourinary: Negative for dysuria. Musculoskeletal: Negative for back pain. Skin: Negative for rash. Neurological: Negative for headaches, focal weakness or numbness.  ____________________________________________   PHYSICAL EXAM:  VITAL SIGNS: ED Triage Vitals [08/03/22 0828]  Encounter Vitals Group     BP (!) 148/83     Pulse Rate 81     Resp (!) 24      Temp 97.9 F (36.6 C)     Temp Source Oral     SpO2 93 %   Constitutional: Alert and oriented. Well appearing and in no acute distress. Eyes: Conjunctivae are normal.  Head: Atraumatic. Nose: No congestion/rhinnorhea. Mouth/Throat: Mucous membranes are moist.   Neck: No stridor.   Cardiovascular: Normal rate, regular rhythm. Good peripheral circulation. Grossly normal heart sounds.   Respiratory: Normal respiratory effort.  No retractions. Lungs with faint end-expiratory wheeze.  Gastrointestinal: Soft and nontender. No distention.  Musculoskeletal: No lower extremity tenderness nor edema. No gross deformities of extremities. Neurologic:  Normal speech and language.  Skin:  Skin is warm, dry and intact. No rash noted.   ____________________________________________   LABS (all labs ordered are listed, but only abnormal results are displayed)  Labs Reviewed  SARS CORONAVIRUS 2 BY RT PCR  COMPREHENSIVE METABOLIC PANEL  BRAIN NATRIURETIC PEPTIDE  LIPASE, BLOOD  CBC WITH DIFFERENTIAL/PLATELET  URINALYSIS, ROUTINE W REFLEX MICROSCOPIC  I-STAT VENOUS BLOOD GAS, ED  TROPONIN I (HIGH SENSITIVITY)   ____________________________________________  EKG   EKG Interpretation Date/Time:  Friday August 03 2022 08:27:16 EDT Ventricular Rate:  81 PR Interval:  151 QRS Duration:  108 QT Interval:  414 QTC Calculation: 481 R Axis:   -25  Text Interpretation: Sinus rhythm Atrial premature complex Borderline left axis deviation RSR' in V1 or V2, right VCD or RVH Borderline prolonged QT interval Confirmed by Alona Bene [phone]) on 08/03/2022 8:30:01 AM  ____________________________________________  RADIOLOGY  No results found.  ____________________________________________   PROCEDURES  Procedure(s) performed:   Procedures   ____________________________________________   INITIAL IMPRESSION / ASSESSMENT AND PLAN / ED COURSE  Pertinent labs & imaging results that  were available during my care of the patient were reviewed by me and considered in my medical decision making (see chart for details).   This patient is Presenting for Evaluation of weakness, which does require a range of treatment options, and is a complaint that involves a high risk of morbidity and mortality.  The Differential Diagnoses includes but is not exclusive to acute coronary syndrome, aortic dissection, pulmonary embolism, cardiac tamponade, community-acquired pneumonia, pericarditis, musculoskeletal chest wall pain, etc.   Critical Interventions-    Medications  ipratropium-albuterol (DUONEB) 0.5-2.5 (3) MG/3ML nebulizer solution 3 mL (has no administration in time range)    Reassessment after intervention:     I did obtain Additional Historical Information from family at bedside.   I decided to review pertinent External Data, and in summary no ECHO on file or heart cath for review.   Clinical Laboratory Tests Ordered, included ***  Radiologic Tests Ordered, included CXR. I independently interpreted the images and agree with radiology interpretation.   Cardiac Monitor Tracing which shows NSR.    Social Determinants of Health Risk patient is not an active smoker.   Consult complete with  Medical Decision Making: Summary:  Patient presents to the emergency department for evaluation of exertional dyspnea with near syncope feeling.  No fever.  Borderline low O2 at rest but most the patient's symptoms are with exertion.  Plan for nebulizer treatment along with chest x-ray, labs and reassess.   Reevaluation with update and discussion with   ***Considered admission***  Patient's presentation is most consistent with acute presentation with potential threat to life or bodily function.   Disposition:   ____________________________________________  FINAL CLINICAL IMPRESSION(S) / ED DIAGNOSES  Final diagnoses:  None     NEW OUTPATIENT MEDICATIONS STARTED DURING  THIS VISIT:  New Prescriptions   No medications on file    Note:  This document was prepared using Dragon voice recognition software and may include unintentional dictation errors.  Alona Bene, MD, [facility] Emergency Medicine

## 2022-08-03 NOTE — Plan of Care (Signed)
  Problem: Education: Goal: Ability to demonstrate management of disease process will improve Outcome: Progressing   Problem: Cardiac: Goal: Ability to achieve and maintain adequate cardiopulmonary perfusion will improve Outcome: Progressing   Problem: Safety: Goal: Ability to remain free from injury will improve Outcome: Progressing

## 2022-08-03 NOTE — ED Notes (Signed)
ED TO INPATIENT HANDOFF REPORT  ED Nurse Name and Phone #:[phone], Tori   S Name/Age/Gender Eric Cook 77 y.o. male Room/Bed: TRAAC/TRAAC  Code Status   Code Status: Full Code  Home/SNF/Other Home Patient oriented to: self, place, time, and situation Is this baseline? Yes   Triage Complete: Triage complete  Chief Complaint CHF (congestive heart failure) (HCC) [I50.9]  Triage Note Pt reports generalized weakness with nausea and increased belching for the past 3 days. Denies chest pain or abd pain   Allergies No Known Allergies  Level of Care/Admitting Diagnosis ED Disposition     ED Disposition  Admit   Condition  --   Comment  Hospital Area: [facility] [[id number]]  Level of Care: Telemetry Medical [104]  May place patient in observation at [facility] or [facility] if equivalent level of care is available:: No  Covid Evaluation: Asymptomatic - no recent exposure (last 10 days) testing not required  Diagnosis: CHF (congestive heart failure) [facility]) [[id number]]  Admitting Physician: Emeline General [[id number]]  Attending Physician: Emeline General [[id number]]          B Medical/Surgery History Past Medical History:  Diagnosis Date   Anxiety    HLD (hyperlipidemia)    Hyperlipidemia    Hypertension    Peptic ulcer    Seizures (HCC)    medication related   Past Surgical History:  Procedure Laterality Date   LUMBAR DISC SURGERY     L3-L4   UPPER GASTROINTESTINAL ENDOSCOPY       A IV Location/Drains/Wounds Patient Lines/Drains/Airways Status     Active Line/Drains/Airways     Name Placement date Placement time Site Days   Peripheral IV 08/03/22 20 G Right Antecubital 08/03/22  1037  Antecubital  less than 1            Intake/Output Last 24 hours  Intake/Output Summary (Last 24 hours) at 08/03/2022 1525 Last data filed at 08/03/2022 1025 Gross per 24 hour  Intake --  Output 350 ml  Net -350 ml    Labs/Imaging Results for orders  placed or performed during the hospital encounter of 08/03/22 (from the past 48 hour(s))  SARS Coronavirus 2 by RT PCR (hospital order, performed in [facility] hospital lab) *cepheid single result test* Anterior Nasal Swab     Status: None   Collection Time: 08/03/22  8:42 AM   Specimen: Anterior Nasal Swab  Result Value Ref Range   SARS Coronavirus 2 by RT PCR NEGATIVE NEGATIVE    Comment: Performed at [facility], [address] [place], [place] [address]  Comprehensive metabolic panel     Status: Abnormal   Collection Time: 08/03/22  8:58 AM  Result Value Ref Range   Sodium 138 135 - 145 mmol/L   Potassium 2.9 (L) 3.5 - 5.1 mmol/L   Chloride 102 98 - 111 mmol/L   CO2 24 22 - 32 mmol/L   Glucose, Bld 113 (H) 70 - 99 mg/dL    Comment: Glucose reference range applies only to samples taken after fasting for at least 8 hours.   BUN 12 8 - 23 mg/dL   Creatinine, Ser 1.30 0.61 - 1.24 mg/dL   Calcium 8.8 (L) 8.9 - 10.3 mg/dL   Total Protein 6.9 6.5 - 8.1 g/dL   Albumin 3.6 3.5 - 5.0 g/dL   AST 26 15 - 41 U/L   ALT 17 0 - 44 U/L   Alkaline Phosphatase 85 38 - 126  U/L   Total Bilirubin 0.8 0.3 - 1.2 mg/dL   GFR, Estimated >16 >10 mL/min    Comment: (NOTE) Calculated using the CKD-EPI Creatinine Equation (2021)    Anion gap 12 5 - 15    Comment: Performed at [facility], [address], [place], [place] [address]  Brain natriuretic peptide     Status: None   Collection Time: 08/03/22  8:58 AM  Result Value Ref Range   B Natriuretic Peptide 98.5 0.0 - 100.0 pg/mL    Comment: Performed at [facility], [address], [place], [place] [address]  Troponin I (High Sensitivity)     Status: None   Collection Time: 08/03/22  8:58 AM  Result Value Ref Range   Troponin I (High Sensitivity) 6 <18 ng/L    Comment: (NOTE) Elevated high sensitivity troponin I (hsTnI) values and significant  changes across serial measurements may suggest ACS but many other  chronic and  acute conditions are known to elevate hsTnI results.  Refer to the "Links" section for chest pain algorithms and additional  guidance. Performed at [facility], [address] [place], [place] [address]   Lipase, blood     Status: None   Collection Time: 08/03/22  8:58 AM  Result Value Ref Range   Lipase 27 11 - 51 U/L    Comment: Performed at [facility], [address] [place], [place] [address]  CBC with Differential     Status: None   Collection Time: 08/03/22  8:58 AM  Result Value Ref Range   WBC 7.6 4.0 - 10.5 K/uL   RBC 4.30 4.22 - 5.81 MIL/uL   Hemoglobin 13.5 13.0 - 17.0 g/dL   HCT 21.3 08.6 - 57.8 %   MCV 91.4 80.0 - 100.0 fL   MCH 31.4 26.0 - 34.0 pg   MCHC 34.4 30.0 - 36.0 g/dL   RDW 46.9 62.9 - 52.8 %   Platelets 294 150 - 400 K/uL   nRBC 0.0 0.0 - 0.2 %   Neutrophils Relative % 70 %   Neutro Abs 5.4 1.7 - 7.7 K/uL   Lymphocytes Relative 14 %   Lymphs Abs 1.1 0.7 - 4.0 K/uL   Monocytes Relative 13 %   Monocytes Absolute 1.0 0.1 - 1.0 K/uL   Eosinophils Relative 2 %   Eosinophils Absolute 0.2 0.0 - 0.5 K/uL   Basophils Relative 1 %   Basophils Absolute 0.1 0.0 - 0.1 K/uL   Immature Granulocytes 0 %   Abs Immature Granulocytes 0.02 0.00 - 0.07 K/uL    Comment: Performed at [facility], [address] [place], [place] [address]  Urinalysis, Routine w reflex microscopic -Urine, Clean Catch     Status: Abnormal   Collection Time: 08/03/22  8:58 AM  Result Value Ref Range   Color, Urine YELLOW YELLOW   APPearance CLOUDY (A) CLEAR   Specific Gravity, Urine 1.012 1.005 - 1.030   pH 9.0 (H) 5.0 - 8.0   Glucose, UA NEGATIVE NEGATIVE mg/dL   Hgb urine dipstick NEGATIVE NEGATIVE   Bilirubin Urine NEGATIVE NEGATIVE   Ketones, ur NEGATIVE NEGATIVE mg/dL   Protein, ur NEGATIVE NEGATIVE mg/dL   Nitrite NEGATIVE NEGATIVE   Leukocytes,Ua NEGATIVE NEGATIVE    Comment: Performed at [facility], [address], [place], [place] [address]   I-Stat venous blood gas, [facility], [facility], [facility])     Status: Abnormal   Collection Time:  08/03/22  9:09 AM  Result Value Ref Range   pH, Ven 7.526 (H) 7.25 - 7.43   pCO2, Ven 31.8 (L) 44 - 60 mmHg   pO2, Ven 75 (H) 32 - 45 mmHg   Bicarbonate 26.3 20.0 - 28.0 mmol/L   TCO2 27 22 - 32 mmol/L   O2 Saturation 97 %   Acid-Base Excess 4.0 (H) 0.0 - 2.0 mmol/L   Sodium 139 135 - 145 mmol/L   Potassium 3.1 (L) 3.5 - 5.1 mmol/L   Calcium, Ion 1.01 (L) 1.15 - 1.40 mmol/L   HCT 40.0 39.0 - 52.0 %   Hemoglobin 13.6 13.0 - 17.0 g/dL   Sample type VENOUS   Troponin I (High Sensitivity)     Status: None   Collection Time: 08/03/22 10:23 AM  Result Value Ref Range   Troponin I (High Sensitivity) 6 <18 ng/L    Comment: (NOTE) Elevated high sensitivity troponin I (hsTnI) values and significant  changes across serial measurements may suggest ACS but many other  chronic and acute conditions are known to elevate hsTnI results.  Refer to the "Links" section for chest pain algorithms and additional  guidance. Performed at [facility], [address] [place], [place] [address]    CT Angio Chest PE W and/or Wo Contrast  Result Date: 08/03/2022 CLINICAL DATA:  77 year old male with weakness, nausea, shortness of breath. EXAM: CT ANGIOGRAPHY CHEST WITH CONTRAST TECHNIQUE: Multidetector CT imaging of the chest was performed using the standard protocol during bolus administration of intravenous contrast. Multiplanar CT image reconstructions and MIPs were obtained to evaluate the vascular anatomy. RADIATION DOSE REDUCTION: This exam was performed according to the departmental dose-optimization program which includes automated exposure control, adjustment of the mA and/or kV according to patient size and/or use of iterative reconstruction technique. CONTRAST:  75mL OMNIPAQUE IOHEXOL 350 MG/ML SOLN COMPARISON:  Portable chest 0920 hours today. Noncontrast low-dose screening chest CT 03/15/2022. FINDINGS:  Cardiovascular: Good contrast bolus timing in the pulmonary arterial tree. No pulmonary artery filling defect identified. Extensive Calcified aortic atherosclerosis. Extensive calcified coronary artery atherosclerosis or stent. Cardiac size within normal limits. No pericardial effusion. Mediastinum/Nodes: Negative. No mediastinal mass or lymphadenopathy. Lungs/Pleura: Emphysema. Moderate centrilobular emphysema with upper lobe predominance. Symmetric dependent atelectasis. Major airways are patent. Some retained secretions however layering in the left mainstem and proximal lower lobe bronchus. No pleural effusion. No consolidation. Upper Abdomen: Negative visible liver, gallbladder, spleen, pancreas, adrenal glands and bowel in the upper abdomen. Musculoskeletal: Osteopenia.  No acute or suspicious osseous lesion. Review of the MIP images confirms the above findings. IMPRESSION: 1. Negative for acute pulmonary embolus. 2. Emphysema (ICD10-J43.9).  Atelectasis. 3.  Aortic Atherosclerosis (ICD10-I70.0). Electronically Signed   By: Odessa Fleming M.D.   On: 08/03/2022 11:30   DG Chest Portable 1 View  Result Date: 08/03/2022 CLINICAL DATA:  Shortness of breath, nausea EXAM: PORTABLE CHEST 1 VIEW COMPARISON:  Chest radiograph 07/21/2021 FINDINGS: The cardiomediastinal silhouette is stable, with unchanged calcified plaque in the aortic arch. Chronically coarsened interstitial markings are unchanged. There is linear opacity in the lingula. There is no other focal airspace opacity. There is no pulmonary edema. There is no pleural effusion or pneumothorax There is no acute osseous abnormality. IMPRESSION: Chronically coarsened interstitial markings with platelike opacity in the lingula likely reflecting atelectasis. Otherwise, no radiographic evidence of acute cardiopulmonary process. Electronically Signed   By: Lesia Hausen M.D.   On: 08/03/2022 09:45    Pending Labs [employer] (  From admission, onward)     Start      Ordered   08/04/22 0500  Basic metabolic panel  Tomorrow morning,   R        08/03/22 1524   08/04/22 0500  CBC  Tomorrow morning,   R        08/03/22 1524   08/03/22 1523  Magnesium  Add-on,   AD        08/03/22 1524            Vitals/Pain Today's Vitals   08/03/22 1250 08/03/22 1330 08/03/22 1345 08/03/22 1435  BP:  (!) 157/71    Pulse:  79 76   Resp:  (!) 21 19   Temp: 98.6 F (37 C)     TempSrc: Temporal     SpO2:  91% 91%   PainSc:    3     Isolation Precautions Airborne and Contact precautions  Medications Medications  potassium chloride 10 mEq in 100 mL IVPB (10 mEq Intravenous New Bag/Given 08/03/22 1453)  furosemide (LASIX) injection 40 mg (has no administration in time range)  amLODipine (NORVASC) tablet 5 mg (has no administration in time range)  famotidine (PEPCID) tablet 10 mg (has no administration in time range)  dicyclomine (BENTYL) capsule 10 mg (has no administration in time range)  pantoprazole (PROTONIX) EC tablet 40 mg (has no administration in time range)  tamsulosin (FLOMAX) capsule 0.4 mg (has no administration in time range)  levETIRAcetam (KEPPRA XR) 24 hr tablet 500 mg (has no administration in time range)  enoxaparin (LOVENOX) injection 40 mg (has no administration in time range)  fluticasone furoate-vilanterol (BREO ELLIPTA) 100-25 MCG/ACT 1 puff (has no administration in time range)  ipratropium-albuterol (DUONEB) 0.5-2.5 (3) MG/3ML nebulizer solution 3 mL (has no administration in time range)  albuterol (PROVENTIL) (2.5 MG/3ML) 0.083% nebulizer solution 2.5 mg (has no administration in time range)  lisinopril (ZESTRIL) tablet 10 mg (has no administration in time range)  ipratropium-albuterol (DUONEB) 0.5-2.5 (3) MG/3ML nebulizer solution 3 mL (3 mLs Nebulization Given 08/03/22 0906)  iohexol (OMNIPAQUE) 350 MG/ML injection 75 mL (75 mLs Intravenous Contrast Given 08/03/22 1118)  potassium chloride SA (KLOR-CON M) CR tablet 40 mEq (40 mEq Oral  Given 08/03/22 1348)  acetaminophen (TYLENOL) tablet 1,000 mg (1,000 mg Oral Given 08/03/22 1348)    Mobility walks     Focused Assessments Cardiac Assessment Handoff:    No results found for: "CKTOTAL", "CKMB", "CKMBINDEX", "TROPONINI" No results found for: "DDIMER" Does the Patient currently have chest pain? No    R Recommendations: See Admitting Provider Note  Report given to:   Additional Notes: axox4, on 2nd bag of K, son at bedside

## 2022-08-03 NOTE — H&P (Signed)
History and Physical    Eric Cook QMV:[id number] DOB: [birth year] DOA: 08/03/2022  PCP: Raliegh Ip, DO (Confirm with patient/family/NH records and if not entered, this has to be entered at [facility] point of entry) Patient coming from: Home  I have personally briefly reviewed patient's old medical records in [facility]  Chief Complaint: SOB  HPI: Eric Cook is a 77 y.o. male with medical history significant of COPD, stage I, HTN, HLD, presented with new onset of exertional dyspnea.  Patient has COPD Gold stage I takes as needed albuterol and most occasions COPD has been fairly controlled.  This week, patient started develop exertional dyspnea, and his exercise tolerance significant decreased as last week he has been very active and able to mowing the lawn, this week however patient can only walk 1 to 2 minutes before starting to feel shortness of breath.  No cough no chest pain no leg swelling.  Became fatigued and generalized weakness with occasional nausea no vomiting.  Patient woke up this morning with feeling of shortness of breath.  Patient reported has been taking BP medications and blood pressure has been controlled with SBP 110s, no significant recent blood pressure medication adjustment. ED Course: Afebrile, blood pressure slightly elevated 150/80, O2 saturation 93% on room air.  CTA negative for PE, but extensive and similar changes as well as significant coronary artery calcifications.  Potassium 2.9 bicarb 24, creatinine 1.0 WBC 7.6  Patient was given DuoNebs and potassium supplement  Review of Systems: As per HPI otherwise 14 point review of systems negative.    Past Medical History:  Diagnosis Date   Anxiety    HLD (hyperlipidemia)    Hyperlipidemia    Hypertension    Peptic ulcer    Seizures (HCC)    medication related    Past Surgical History:  Procedure Laterality Date   LUMBAR DISC SURGERY     L3-L4   UPPER GASTROINTESTINAL ENDOSCOPY        reports that he quit smoking about 3 years ago. His smoking use included cigarettes. He started smoking about 57 years ago. He has a 54 pack-year smoking history. He has never used smokeless tobacco. He reports that he does not drink alcohol and does not use drugs.  No Known Allergies  Family History  Problem Relation Age of Onset   Colon cancer Brother    Other Son        vertigo   Kidney Stones Son    Esophageal cancer Neg Hx    Pancreatic cancer Neg Hx    Stomach cancer Neg Hx      Prior to Admission medications   Medication Sig Start Date End Date Taking? Authorizing Provider  amLODipine (NORVASC) 5 MG tablet Take 1 tablet (5 mg total) by mouth daily. STOP lisinopril/ HCTZ 10/18/21   Delynn Flavin M, DO  cimetidine (TAGAMET) 400 MG tablet TAKE 1 TABLET TWICE DAILY AS NEEDED 04/24/22   Delynn Flavin M, DO  dicyclomine (BENTYL) 10 MG capsule TAKE 20 MG (2 CAPSULES) 15 MINUTES BEFORE MEALS AS NEEDED 03/02/22   Hilarie Fredrickson, MD  fluticasone [facility]) 50 MCG/ACT nasal spray USE 2 SPRAYS IN EACH NOSTRIL EVERY DAY 03/16/22   Delynn Flavin M, DO  fluticasone furoate-vilanterol (BREO ELLIPTA) 200-25 MCG/ACT AEPB Inhale 1 puff into the lungs daily. 10/19/21   Hunsucker, Lesia Sago, MD  levETIRAcetam (KEPPRA XR) 500 MG 24 hr tablet Take 1 tablet (500 mg total) by mouth at bedtime. 02/22/22  Ihor Austin, NP  meloxicam (MOBIC) 15 MG tablet Take 15 mg by mouth daily.    [provider]  pantoprazole (PROTONIX) 40 MG tablet Take 1 tablet (40 mg total) by mouth daily. Office visit for further refills 07/04/22   Hilarie Fredrickson, MD  predniSONE (STERAPRED UNI-PAK 21 TAB) 10 MG (21) TBPK tablet As directed x 6 days 01/17/22   Raliegh Ip, DO  simvastatin (ZOCOR) 20 MG tablet Take 1 tablet (20 mg total) by mouth daily at 6 PM. 10/18/21   Raliegh Ip, DO  tamsulosin (FLOMAX) 0.4 MG CAPS capsule TAKE 1 CAPSULE (0.4 MG TOTAL) BY MOUTH DAILY. 06/06/22   Raliegh Ip, DO     Physical Exam: Vitals:   08/03/22 1250 08/03/22 1250 08/03/22 1330 08/03/22 1345  BP: (!) 148/82  (!) 157/71   Pulse: 75  79 76  Resp: 17  (!) 21 19  Temp:  98.6 F (37 C)    TempSrc:  Temporal    SpO2: 92%  91% 91%    Constitutional: NAD, calm, comfortable Vitals:   08/03/22 1250 08/03/22 1250 08/03/22 1330 08/03/22 1345  BP: (!) 148/82  (!) 157/71   Pulse: 75  79 76  Resp: 17  (!) 21 19  Temp:  98.6 F (37 C)    TempSrc:  Temporal    SpO2: 92%  91% 91%   Eyes: PERRL, lids and conjunctivae normal ENMT: Mucous membranes are moist. Posterior pharynx clear of any exudate or lesions.Normal dentition.  Neck: normal, supple, no masses, no thyromegaly Respiratory: clear to auscultation bilaterally, no wheezing, fine crackles on bilateral lower lungs, increasing respiratory effort. No accessory muscle use.  Cardiovascular: Regular rate and rhythm, no murmurs / rubs / gallops. No extremity edema. 2+ pedal pulses. No carotid bruits.  Abdomen: no tenderness, no masses palpated. No hepatosplenomegaly. Bowel sounds positive.  Musculoskeletal: no clubbing / cyanosis. No joint deformity upper and lower extremities. Good ROM, no contractures. Normal muscle tone.  Skin: no rashes, lesions, ulcers. No induration Neurologic: CN 2-12 grossly intact. Sensation intact, DTR normal. Strength 5/5 in all 4.  Psychiatric: Normal judgment and insight. Alert and oriented x 3. Normal mood.     Labs on Admission: I have personally reviewed following labs and imaging studies  CBC: Recent Labs  Lab 08/03/22 0858 08/03/22 0909  WBC 7.6  --   NEUTROABS 5.4  --   HGB 13.5 13.6  HCT 39.3 40.0  MCV 91.4  --   PLT 294  --    Basic Metabolic Panel: Recent Labs  Lab 08/03/22 0858 08/03/22 0909  NA 138 139  K 2.9* 3.1*  CL 102  --   CO2 24  --   GLUCOSE 113*  --   BUN 12  --   CREATININE 1.08  --   CALCIUM 8.8*  --    GFR: CrCl cannot be calculated (Unknown ideal weight.). Liver  Function Tests: Recent Labs  Lab 08/03/22 0858  AST 26  ALT 17  ALKPHOS 85  BILITOT 0.8  PROT 6.9  ALBUMIN 3.6   Recent Labs  Lab 08/03/22 0858  LIPASE 27   No results for input(s): "AMMONIA" in the last 168 hours. Coagulation Profile: No results for input(s): "INR", "PROTIME" in the last 168 hours. Cardiac Enzymes: No results for input(s): "CKTOTAL", "CKMB", "CKMBINDEX", "TROPONINI" in the last 168 hours. BNP (last 3 results) No results for input(s): "PROBNP" in the last 8760 hours. HbA1C: No results for input(s): "HGBA1C"  in the last 72 hours. CBG: No results for input(s): "GLUCAP" in the last 168 hours. Lipid Profile: No results for input(s): "CHOL", "HDL", "LDLCALC", "TRIG", "CHOLHDL", "LDLDIRECT" in the last 72 hours. Thyroid Function Tests: No results for input(s): "TSH", "T4TOTAL", "FREET4", "T3FREE", "THYROIDAB" in the last 72 hours. Anemia Panel: No results for input(s): "VITAMINB12", "FOLATE", "FERRITIN", "TIBC", "IRON", "RETICCTPCT" in the last 72 hours. Urine analysis:    Component Value Date/Time   COLORURINE YELLOW 08/03/2022 0858   APPEARANCEUR CLOUDY (A) 08/03/2022 0858   APPEARANCEUR Clear 11/09/2019 1439   LABSPEC 1.012 08/03/2022 0858   PHURINE 9.0 (H) 08/03/2022 0858   GLUCOSEU NEGATIVE 08/03/2022 0858   HGBUR NEGATIVE 08/03/2022 0858   BILIRUBINUR NEGATIVE 08/03/2022 0858   BILIRUBINUR Negative 11/09/2019 1439   KETONESUR NEGATIVE 08/03/2022 0858   PROTEINUR NEGATIVE 08/03/2022 0858   NITRITE NEGATIVE 08/03/2022 0858   LEUKOCYTESUR NEGATIVE 08/03/2022 0858    Radiological Exams on Admission: CT Angio Chest PE W and/or Wo Contrast  Result Date: 08/03/2022 CLINICAL DATA:  77 year old male with weakness, nausea, shortness of breath. EXAM: CT ANGIOGRAPHY CHEST WITH CONTRAST TECHNIQUE: Multidetector CT imaging of the chest was performed using the standard protocol during bolus administration of intravenous contrast. Multiplanar CT image  reconstructions and MIPs were obtained to evaluate the vascular anatomy. RADIATION DOSE REDUCTION: This exam was performed according to the departmental dose-optimization program which includes automated exposure control, adjustment of the mA and/or kV according to patient size and/or use of iterative reconstruction technique. CONTRAST:  75mL OMNIPAQUE IOHEXOL 350 MG/ML SOLN COMPARISON:  Portable chest 0920 hours today. Noncontrast low-dose screening chest CT 03/15/2022. FINDINGS: Cardiovascular: Good contrast bolus timing in the pulmonary arterial tree. No pulmonary artery filling defect identified. Extensive Calcified aortic atherosclerosis. Extensive calcified coronary artery atherosclerosis or stent. Cardiac size within normal limits. No pericardial effusion. Mediastinum/Nodes: Negative. No mediastinal mass or lymphadenopathy. Lungs/Pleura: Emphysema. Moderate centrilobular emphysema with upper lobe predominance. Symmetric dependent atelectasis. Major airways are patent. Some retained secretions however layering in the left mainstem and proximal lower lobe bronchus. No pleural effusion. No consolidation. Upper Abdomen: Negative visible liver, gallbladder, spleen, pancreas, adrenal glands and bowel in the upper abdomen. Musculoskeletal: Osteopenia.  No acute or suspicious osseous lesion. Review of the MIP images confirms the above findings. IMPRESSION: 1. Negative for acute pulmonary embolus. 2. Emphysema (ICD10-J43.9).  Atelectasis. 3.  Aortic Atherosclerosis (ICD10-I70.0). Electronically Signed   By: Odessa Fleming M.D.   On: 08/03/2022 11:30   DG Chest Portable 1 View  Result Date: 08/03/2022 CLINICAL DATA:  Shortness of breath, nausea EXAM: PORTABLE CHEST 1 VIEW COMPARISON:  Chest radiograph 07/21/2021 FINDINGS: The cardiomediastinal silhouette is stable, with unchanged calcified plaque in the aortic arch. Chronically coarsened interstitial markings are unchanged. There is linear opacity in the lingula. There is  no other focal airspace opacity. There is no pulmonary edema. There is no pleural effusion or pneumothorax There is no acute osseous abnormality. IMPRESSION: Chronically coarsened interstitial markings with platelike opacity in the lingula likely reflecting atelectasis. Otherwise, no radiographic evidence of acute cardiopulmonary process. Electronically Signed   By: Lesia Hausen M.D.   On: 08/03/2022 09:45    EKG: Independently reviewed.  Sinus rhythm, no acute ST changes.  Assessment/Plan Principal Problem:   CHF (congestive heart failure) (HCC) Active Problems:   Acute on chronic diastolic CHF (congestive heart failure) (HCC)   COPD (chronic obstructive pulmonary disease) (HCC)  (please populate well all problems here in Problem List. (For example, if patient is on  BP meds at home and you resume or decide to hold them, it is a problem that needs to be her. Same for CAD, COPD, HLD and so on)  New onset of CHF, diastolic versus systolic -Evidenced by significant exertional dyspnea, signs of fluid overload with bilateral crackles on physical exam.  CTA appears to have trace bilateral pleural effusion. -1 dose of IV Lasix -Echocardiogram -Start lisinopril -CTA incidental finding of extensive calcified coronary artery atherosclerosis, equivalent to CAD, recommend outpatient follow-up with cardiology for further ischemic study -Other DDx, COPD exacerbation less likely given ABG finding of acute respiratory alkalosis  COPD Gold stage I -Start ICS and LABA -DuoNebs every 6 hours and as needed albuterol -Incentive spirometry  Hypokalemia -IV and p.o. replacement, recheck level tomorrow  HTN -Uncontrolled, continue amlodipine -Add lisinopril  DVT prophylaxis: Lovenox Code Status: Full code Family Communication: Son at bedside Disposition Plan: Expect less than 2 midnight hospital stay Consults called: None Admission status: Telemetry observation   Emeline General MD [facility] Pager [phone]  08/03/2022, 3:50 PM

## 2022-08-03 NOTE — Plan of Care (Signed)
  Problem: Education: Goal: Ability to demonstrate management of disease process will improve Outcome: Progressing   Problem: Activity: Goal: Capacity to carry out activities will improve Outcome: Progressing   Problem: Cardiac: Goal: Ability to achieve and maintain adequate cardiopulmonary perfusion will improve Outcome: Progressing   Problem: Activity: Goal: Ability to tolerate increased activity will improve Outcome: Progressing   Problem: Safety: Goal: Ability to remain free from injury will improve Outcome: Progressing

## 2022-08-03 NOTE — ED Triage Notes (Signed)
Pt reports generalized weakness with nausea and increased belching for the past 3 days. Denies chest pain or abd pain

## 2022-08-04 ENCOUNTER — Observation Stay (HOSPITAL_BASED_OUTPATIENT_CLINIC_OR_DEPARTMENT_OTHER): Payer: Medicare HMO

## 2022-08-04 DIAGNOSIS — I5033 Acute on chronic diastolic (congestive) heart failure: Secondary | ICD-10-CM

## 2022-08-04 DIAGNOSIS — G40219 Localization-related (focal) (partial) symptomatic epilepsy and epileptic syndromes with complex partial seizures, intractable, without status epilepticus: Secondary | ICD-10-CM | POA: Diagnosis not present

## 2022-08-04 DIAGNOSIS — J449 Chronic obstructive pulmonary disease, unspecified: Secondary | ICD-10-CM

## 2022-08-04 DIAGNOSIS — N401 Enlarged prostate with lower urinary tract symptoms: Secondary | ICD-10-CM | POA: Diagnosis not present

## 2022-08-04 DIAGNOSIS — I5031 Acute diastolic (congestive) heart failure: Secondary | ICD-10-CM

## 2022-08-04 DIAGNOSIS — N138 Other obstructive and reflux uropathy: Secondary | ICD-10-CM

## 2022-08-04 DIAGNOSIS — E876 Hypokalemia: Secondary | ICD-10-CM | POA: Diagnosis not present

## 2022-08-04 DIAGNOSIS — I10 Essential (primary) hypertension: Secondary | ICD-10-CM | POA: Diagnosis not present

## 2022-08-04 DIAGNOSIS — E785 Hyperlipidemia, unspecified: Secondary | ICD-10-CM | POA: Diagnosis not present

## 2022-08-04 LAB — ECHOCARDIOGRAM COMPLETE
AR max vel: 2.5 cm2
AV Peak grad: 5.6 mmHg
Ao pk vel: 1.18 m/s
Area-P 1/2: 3.91 cm2
Calc EF: 54 %
Height: 70 in
S' Lateral: 3.4 cm
Single Plane A2C EF: 53.4 %
Single Plane A4C EF: 54 %
Weight: 3315.2 oz

## 2022-08-04 LAB — CBC
HCT: 37.7 % — ABNORMAL LOW (ref 39.0–52.0)
Hemoglobin: 13.2 g/dL (ref 13.0–17.0)
MCH: 31.6 pg (ref 26.0–34.0)
MCHC: 35 g/dL (ref 30.0–36.0)
MCV: 90.2 fL (ref 80.0–100.0)
Platelets: 299 10*3/uL (ref 150–400)
RBC: 4.18 MIL/uL — ABNORMAL LOW (ref 4.22–5.81)
RDW: 12.3 % (ref 11.5–15.5)
WBC: 8.5 10*3/uL (ref 4.0–10.5)
nRBC: 0 % (ref 0.0–0.2)

## 2022-08-04 LAB — BASIC METABOLIC PANEL
Anion gap: 12 (ref 5–15)
BUN: 12 mg/dL (ref 8–23)
CO2: 24 mmol/L (ref 22–32)
Calcium: 8.9 mg/dL (ref 8.9–10.3)
Chloride: 100 mmol/L (ref 98–111)
Creatinine, Ser: 1.12 mg/dL (ref 0.61–1.24)
GFR, Estimated: >60 mL/min (ref >60)
Glucose, Bld: 117 mg/dL — ABNORMAL HIGH (ref 70–99)
Potassium: 3.2 mmol/L — ABNORMAL LOW (ref 3.5–5.1)
Sodium: 136 mmol/L (ref 135–145)

## 2022-08-04 MED ORDER — POTASSIUM CHLORIDE 10 MEQ/100ML IV SOLN
10.0000 meq | INTRAVENOUS | Status: AC
  Administered 2022-08-04: 10 meq via INTRAVENOUS
  Filled 2022-08-04: qty 100

## 2022-08-04 MED ORDER — POTASSIUM CHLORIDE CRYS ER 20 MEQ PO TBCR
40.0000 meq | EXTENDED_RELEASE_TABLET | Freq: Once | ORAL | Status: AC
  Administered 2022-08-04: 40 meq via ORAL
  Filled 2022-08-04: qty 2

## 2022-08-04 MED ORDER — ACETAMINOPHEN 325 MG PO TABS
650.0000 mg | ORAL_TABLET | Freq: Four times a day (QID) | ORAL | Status: DC | PRN
  Administered 2022-08-04: 650 mg via ORAL

## 2022-08-04 MED ORDER — FUROSEMIDE 20 MG PO TABS: 20.0000 mg | ORAL_TABLET | Freq: Every day | ORAL | Status: DC | PRN

## 2022-08-04 MED ORDER — LOSARTAN POTASSIUM 25 MG PO TABS: 25.0000 mg | ORAL_TABLET | Freq: Every day | ORAL | 0 refills | Status: DC

## 2022-08-04 MED ORDER — FUROSEMIDE 20 MG PO TABS: 20.0000 mg | ORAL_TABLET | Freq: Every day | ORAL | 0 refills | Status: DC | PRN

## 2022-08-04 MED ORDER — SPIRONOLACTONE 25 MG PO TABS: 12.5000 mg | ORAL_TABLET | Freq: Every day | ORAL | 0 refills | Status: DC

## 2022-08-04 MED ORDER — SPIRONOLACTONE 12.5 MG HALF TABLET: 12.5000 mg | ORAL_TABLET | Freq: Every day | ORAL | Status: DC

## 2022-08-04 MED ORDER — POTASSIUM CHLORIDE CRYS ER 20 MEQ PO TBCR
40.0000 meq | EXTENDED_RELEASE_TABLET | Freq: Once | ORAL | Status: AC
  Administered 2022-08-04: 40 meq via ORAL

## 2022-08-04 MED ORDER — ONDANSETRON HCL 4 MG/2ML IJ SOLN
4.0000 mg | Freq: Four times a day (QID) | INTRAMUSCULAR | Status: DC | PRN
  Filled 2022-08-04: qty 2

## 2022-08-04 MED ORDER — LOSARTAN POTASSIUM 25 MG PO TABS: 25.0000 mg | ORAL_TABLET | Freq: Every day | ORAL | Status: DC

## 2022-08-04 NOTE — Care Management Obs Status (Signed)
MEDICARE OBSERVATION STATUS NOTIFICATION   Patient Details  Name: Eric Cook MRN: [id number] Date of Birth: 07-25-1945   Medicare Observation Status Notification Given:  Yes    Ronny Bacon, RN 08/04/2022, 3:12 PM

## 2022-08-04 NOTE — Discharge Instructions (Signed)
   Newly diagnosed diastolic heart failure, placed on losartan and spironolactone.  Discontinue amlodipine.  Furosemide as needed for signs of volume overload, weight gain 2 to 3 lbs in 24 hrs or 5 lbs in 7 days.  Follow up renal function and electrolytes in 7 days. Follow up with Dr. Nadine Counts in 7 to 10 days.

## 2022-08-04 NOTE — Assessment & Plan Note (Addendum)
Echocardiogram with preserved LV systolic function with EF 55 to 60%, mild LVH, RV systolic function preserved,   Patient was placed on IV furosemide for diuresis, negative fluid balance was achieved, -1,766 ml with significant improvement in his symptoms.   Systolic blood pressure 120 to 130 mmHg.   Plan to change amlodipine to losartan. Add spironolactone and continue furosemide as needed.  Consider SGLT 2 inh as outpatient.  Follow up as outpatient.

## 2022-08-04 NOTE — Assessment & Plan Note (Signed)
Continue blood pressure control with losartan and spironolactone.  Discontinue amlodipine.

## 2022-08-04 NOTE — Progress Notes (Signed)
[facility] night cross cover note:   Prn acetaminophen added per patient request for back discomfort.     Newton Pigg, DO Hospitalist

## 2022-08-04 NOTE — Assessment & Plan Note (Signed)
No urinary retention, continue with tamsulosin.    

## 2022-08-04 NOTE — Hospital Course (Addendum)
Eric Cook was admitted to the hospital with the working diagnosis of heart failure exacerbation.   77 yo male with the past medical history of COPD, hypertension, and dyslipidemia who presented with dyspnea. Reported one week of exertional dyspnea, with decreased physical functional capacity. On the day of admission his dyspnea became acutely worse prompting him to come to the ED. On his initial physical examination his blood pressure was 148/82, HR 75, RR 17 and 02 saturation 92%, lungs with rales bilaterally and increased work of breathing, heart with S1 and S2 present and rhythmic, with no gallops, abdomen with no distention and no lower extremity edema.   VBG 7,52/ 31.8/ 75/ 26/ 97  Na 138, K 2,9 Cl 102 bicarbonate 24, glucose 113, bun 12 cr 1,08 Wbc 7,6 hgb 13,5 plt 294  Sars covid 19 negative Urine analysis SG 1,012, negative leukocytes and negative hgb.   Chest radiograph with mild cardiomegaly, mild cephalization of the vasculature, no infiltrates or effusions.   CT with no pulmonary embolism, bilateral centrilobular emphysema, more at the upper zones, with faint bilateral ground glass opacities. Bibasilar atelectasis.   EKG 81 bpm, left axis deviation, normal intervals, sinus rhythm with no significant ST segment or T wave changes.

## 2022-08-04 NOTE — Discharge Summary (Addendum)
Physician Discharge Summary   Patient: Eric Cook MRN: [id number] DOB: 1945-04-20  Admit date:     08/03/2022  Discharge date: 08/04/22  Discharge Physician: Eric Cook   PCP: Eric Ip, DO   Recommendations at discharge:    Newly diagnosed diastolic heart failure, placed on losartan and spironolactone.  Discontinue amlodipine.  Furosemide as needed for signs of volume overload, weight gain 2 to 3 lbs in 24 hrs or 5 lbs in 7 days.  Follow up renal function and electrolytes in 7 days. Follow up with Dr. Nadine Counts in 7 to 10 days.   Discharge Diagnoses: Principal Problem:   Acute on chronic diastolic CHF (congestive heart failure) (HCC) Active Problems:   COPD (chronic obstructive pulmonary disease) (HCC)   HTN (hypertension)   BPH (benign prostatic hyperplasia)   Partial symptomatic epilepsy with complex partial seizures, intractable, without status epilepticus (HCC)  Resolved Problems:   * No resolved hospital problems. [facility] Course: Eric Cook was admitted to the hospital with the working diagnosis of heart failure exacerbation.   77 yo male with the past medical history of COPD, hypertension, and dyslipidemia who presented with dyspnea. Reported one week of exertional dyspnea, with decreased physical functional capacity. On the day of admission his dyspnea became acutely worse prompting him to come to the ED. On his initial physical examination his blood pressure was 148/82, HR 75, RR 17 and 02 saturation 92%, lungs with rales bilaterally and increased work of breathing, heart with S1 and S2 present and rhythmic, with no gallops, abdomen with no distention and no lower extremity edema.   VBG 7,52/ 31.8/ 75/ 26/ 97  Na 138, K 2,9 Cl 102 bicarbonate 24, glucose 113, bun 12 cr 1,08 Wbc 7,6 hgb 13,5 plt 294  Sars covid 19 negative Urine analysis SG 1,012, negative leukocytes and negative hgb.   Chest radiograph with mild cardiomegaly, mild  cephalization of the vasculature, no infiltrates or effusions.   CT with no pulmonary embolism, bilateral centrilobular emphysema, more at the upper zones, with faint bilateral ground glass opacities. Bibasilar atelectasis.   EKG 81 bpm, left axis deviation, normal intervals, sinus rhythm with no significant ST segment or T wave changes.    Assessment and Plan: * Acute on chronic diastolic CHF (congestive heart failure) (HCC) Echocardiogram with preserved LV systolic function with EF 55 to 60%, mild LVH, RV systolic function preserved,   Patient was placed on IV furosemide for diuresis, negative fluid balance was achieved, -1,766 ml with significant improvement in his symptoms.   Systolic blood pressure 120 to 130 mmHg.   Plan to change amlodipine to losartan. Add spironolactone and continue furosemide as needed.  Consider SGLT 2 inh as outpatient.  Follow up as outpatient.   COPD (chronic obstructive pulmonary disease) (HCC) Patient with significant centrilobular emphysema.  Will continue bronchodilator therapy.    Hypokalemia Renal function stable, at the time of her discharge her serum cr was 1,1 with K at 3,2 and serum bicarbonate at 24. Na 136 and Mg 1,9  Patient received a total of 90 meq Kcl today, and will follow up renal function and electrolytes as outpatient.   HTN (hypertension) Continue blood pressure control with losartan and spironolactone.  Discontinue amlodipine.   Dyslipidemia Continue with simvastatin.   Partial symptomatic epilepsy with complex partial seizures, intractable, without status epilepticus (HCC) Continue with keppra.   BPH (benign prostatic hyperplasia) No urinary retention, continue with tamsulosin.    Consultants: none  Procedures  performed: none   Disposition: Home Diet recommendation:  Cardiac diet DISCHARGE MEDICATION: Allergies as of 08/04/2022   No Known Allergies      Medication List     STOP taking these medications     amLODipine 5 MG tablet Commonly known as: NORVASC       TAKE these medications    acetaminophen 500 MG tablet Commonly known as: TYLENOL Take 500 mg by mouth as needed for moderate pain or mild pain (As Needed).   cimetidine 400 MG tablet Commonly known as: TAGAMET TAKE 1 TABLET TWICE DAILY AS NEEDED   dicyclomine 10 MG capsule Commonly known as: BENTYL TAKE 20 MG (2 CAPSULES) 15 MINUTES BEFORE MEALS AS NEEDED   fluticasone 50 MCG/ACT nasal spray Commonly known as: FLONASE USE 2 SPRAYS IN EACH NOSTRIL EVERY DAY   fluticasone furoate-vilanterol 200-25 MCG/ACT Aepb Commonly known as: Breo Ellipta Inhale 1 puff into the lungs daily.   furosemide 20 MG tablet Commonly known as: LASIX Take 1 tablet (20 mg total) by mouth daily as needed for edema or fluid (take in case of weight gain 2 to 3 lbs in 24 hrs or 5 lbs in 7 days.).   levETIRAcetam 500 MG 24 hr tablet Commonly known as: KEPPRA XR Take 1 tablet (500 mg total) by mouth at bedtime.   losartan 25 MG tablet Commonly known as: COZAAR Take 1 tablet (25 mg total) by mouth daily. Start taking on: August 05, 2022   pantoprazole 40 MG tablet Commonly known as: PROTONIX Take 1 tablet (40 mg total) by mouth daily. Office visit for further refills   simvastatin 20 MG tablet Commonly known as: ZOCOR Take 1 tablet (20 mg total) by mouth daily at 6 PM.   spironolactone 25 MG tablet Commonly known as: ALDACTONE Take 0.5 tablets (12.5 mg total) by mouth daily. Start taking on: August 05, 2022   tamsulosin 0.4 MG Caps capsule Commonly known as: FLOMAX TAKE 1 CAPSULE (0.4 MG TOTAL) BY MOUTH DAILY.        Discharge Exam: Filed Weights   08/03/22 1700 08/04/22 0410  Weight: 95.9 kg 94 kg   BP 130/73 (BP Location: Left Arm)   Pulse 74   Temp 98 F (36.7 C) (Oral)   Resp 20   Ht 5\' 10"  (1.778 m)   Wt 94 kg   SpO2 92%   BMI 29.73 kg/m   Patient back to his baseline, no dyspnea or chest pain, no lower extremity  edema, PND or orthopnea. This morning had nausea and dyspepsia but now have improved.   Neurology awake and alert ENT with mild pallor Cardiovascular with S1 and S2 present and rhythmic with no gallops, rubs or murmurs Respiratory with no wheezing or rales, no rhonchi, mild prolonged expiratory phase Abdomen with no distention   Condition at discharge: stable  The results of significant diagnostics from this hospitalization (including imaging, microbiology, ancillary and laboratory) are listed below for reference.   Imaging Studies: ECHOCARDIOGRAM COMPLETE  Result Date: 08/04/2022    ECHOCARDIOGRAM REPORT   Patient Name:   BELDON SHAAK Date of Exam: 08/04/2022 Medical Rec #:  [id number]       Height:       70.0 in Accession #:    [id number]      Weight:       207.2 lb Date of Birth:  1945-10-07       BSA:          2.119 m Patient Age:  77 years        BP:           121/71 mmHg Patient Gender: M               HR:           77 bpm. Exam Location:  Inpatient Procedure: 2D Echo, Cardiac Doppler and Color Doppler Indications:    CHF- Acute Diastolic  History:        Patient has no prior history of Echocardiogram examinations.                 COPD; Risk Factors:Former Smoker, Dyslipidemia and Hypertension.  Sonographer:    Raeford Razor Referring Phys: [id number] Emeline General  Sonographer Comments: Image acquisition challenging due to patient body habitus. IMPRESSIONS  1. Left ventricular ejection fraction, by estimation, is 55 to 60%. The left ventricle has normal function. The left ventricle has no regional wall motion abnormalities. There is mild concentric left ventricular hypertrophy. Left ventricular diastolic parameters are consistent with Grade I diastolic dysfunction (impaired relaxation).  2. Right ventricular systolic function is normal. The right ventricular size is normal.  3. The mitral valve is normal in structure. Trivial mitral valve regurgitation. No evidence of mitral stenosis.  4. The aortic  valve is tricuspid. There is mild calcification of the aortic valve. Aortic valve regurgitation is not visualized. No aortic stenosis is present.  5. The inferior vena cava is normal in size with greater than 50% respiratory variability, suggesting right atrial pressure of 3 mmHg. FINDINGS  Left Ventricle: Left ventricular ejection fraction, by estimation, is 55 to 60%. The left ventricle has normal function. The left ventricle has no regional wall motion abnormalities. The left ventricular internal cavity size was normal in size. There is  mild concentric left ventricular hypertrophy. Left ventricular diastolic parameters are consistent with Grade I diastolic dysfunction (impaired relaxation). Right Ventricle: The right ventricular size is normal. No increase in right ventricular wall thickness. Right ventricular systolic function is normal. Left Atrium: Left atrial size was normal in size. Right Atrium: Right atrial size was normal in size. Pericardium: There is no evidence of pericardial effusion. Mitral Valve: The mitral valve is normal in structure. Mild mitral annular calcification. Trivial mitral valve regurgitation. No evidence of mitral valve stenosis. Tricuspid Valve: The tricuspid valve is normal in structure. Tricuspid valve regurgitation is trivial. No evidence of tricuspid stenosis. Aortic Valve: The aortic valve is tricuspid. There is mild calcification of the aortic valve. Aortic valve regurgitation is not visualized. No aortic stenosis is present. Aortic valve peak gradient measures 5.6 mmHg. Pulmonic Valve: The pulmonic valve was normal in structure. Pulmonic valve regurgitation is trivial. No evidence of pulmonic stenosis. Aorta: The aortic root is normal in size and structure. Venous: The inferior vena cava is normal in size with greater than 50% respiratory variability, suggesting right atrial pressure of 3 mmHg. IAS/Shunts: No atrial level shunt detected by color flow Doppler. Additional  Comments: A device lead is visualized.  LEFT VENTRICLE PLAX 2D LVIDd:         4.70 cm      Diastology LVIDs:         3.40 cm      LV e' medial:    7.18 cm/s LV PW:         1.10 cm      LV E/e' medial:  11.4 LV IVS:        1.10 cm  LV e' lateral:   8.38 cm/s LVOT diam:     1.90 cm      LV E/e' lateral: 9.7 LV SV:         54 LV SV Index:   25 LVOT Area:     2.84 cm  LV Volumes (MOD) LV vol d, MOD A2C: 96.9 ml LV vol d, MOD A4C: 122.0 ml LV vol s, MOD A2C: 45.2 ml LV vol s, MOD A4C: 56.1 ml LV SV MOD A2C:     51.7 ml LV SV MOD A4C:     122.0 ml LV SV MOD BP:      58.8 ml RIGHT VENTRICLE             IVC RV Basal diam:  2.80 cm     IVC diam: 1.80 cm RV S prime:     12.50 cm/s TAPSE (M-mode): 1.9 cm LEFT ATRIUM             Index        RIGHT ATRIUM          Index LA diam:        4.20 cm 1.98 cm/m   RA Area:     8.43 cm LA Vol (A2C):   61.4 ml 28.98 ml/m  RA Volume:   15.20 ml 7.17 ml/m LA Vol (A4C):   35.6 ml 16.80 ml/m LA Biplane Vol: 50.6 ml 23.88 ml/m  AORTIC VALVE AV Area (Vmax): 2.50 cm AV Vmax:        118.00 cm/s AV Peak Grad:   5.6 mmHg LVOT Vmax:      104.00 cm/s LVOT Vmean:     68.700 cm/s LVOT VTI:       0.190 m  AORTA Ao Root diam: 2.60 cm Ao Asc diam:  2.90 cm MITRAL VALVE MV Area (PHT): 3.91 cm     SHUNTS MV Decel Time: 194 msec     Systemic VTI:  0.19 m MV E velocity: 81.50 cm/s   Systemic Diam: 1.90 cm MV A velocity: 120.00 cm/s MV E/A ratio:  0.68 Arvilla Meres MD Electronically signed by Arvilla Meres MD Signature Date/Time: 08/04/2022/11:01:38 AM    Final    CT Angio Chest PE W and/or Wo Contrast  Result Date: 08/03/2022 CLINICAL DATA:  77 year old male with weakness, nausea, shortness of breath. EXAM: CT ANGIOGRAPHY CHEST WITH CONTRAST TECHNIQUE: Multidetector CT imaging of the chest was performed using the standard protocol during bolus administration of intravenous contrast. Multiplanar CT image reconstructions and MIPs were obtained to evaluate the vascular anatomy. RADIATION  DOSE REDUCTION: This exam was performed according to the departmental dose-optimization program which includes automated exposure control, adjustment of the mA and/or kV according to patient size and/or use of iterative reconstruction technique. CONTRAST:  75mL OMNIPAQUE IOHEXOL 350 MG/ML SOLN COMPARISON:  Portable chest 0920 hours today. Noncontrast low-dose screening chest CT 03/15/2022. FINDINGS: Cardiovascular: Good contrast bolus timing in the pulmonary arterial tree. No pulmonary artery filling defect identified. Extensive Calcified aortic atherosclerosis. Extensive calcified coronary artery atherosclerosis or stent. Cardiac size within normal limits. No pericardial effusion. Mediastinum/Nodes: Negative. No mediastinal mass or lymphadenopathy. Lungs/Pleura: Emphysema. Moderate centrilobular emphysema with upper lobe predominance. Symmetric dependent atelectasis. Major airways are patent. Some retained secretions however layering in the left mainstem and proximal lower lobe bronchus. No pleural effusion. No consolidation. Upper Abdomen: Negative visible liver, gallbladder, spleen, pancreas, adrenal glands and bowel in the upper abdomen. Musculoskeletal: Osteopenia.  No acute or suspicious osseous lesion. Review of  the MIP images confirms the above findings. IMPRESSION: 1. Negative for acute pulmonary embolus. 2. Emphysema (ICD10-J43.9).  Atelectasis. 3.  Aortic Atherosclerosis (ICD10-I70.0). Electronically Signed   By: Odessa Fleming M.D.   On: 08/03/2022 11:30   DG Chest Portable 1 View  Result Date: 08/03/2022 CLINICAL DATA:  Shortness of breath, nausea EXAM: PORTABLE CHEST 1 VIEW COMPARISON:  Chest radiograph 07/21/2021 FINDINGS: The cardiomediastinal silhouette is stable, with unchanged calcified plaque in the aortic arch. Chronically coarsened interstitial markings are unchanged. There is linear opacity in the lingula. There is no other focal airspace opacity. There is no pulmonary edema. There is no pleural  effusion or pneumothorax There is no acute osseous abnormality. IMPRESSION: Chronically coarsened interstitial markings with platelike opacity in the lingula likely reflecting atelectasis. Otherwise, no radiographic evidence of acute cardiopulmonary process. Electronically Signed   By: Lesia Hausen M.D.   On: 08/03/2022 09:45    Microbiology: Results for orders placed or performed during the hospital encounter of 08/03/22  SARS Coronavirus 2 by RT PCR (hospital order, performed in [facility] hospital lab) *cepheid single result test* Anterior Nasal Swab     Status: None   Collection Time: 08/03/22  8:42 AM   Specimen: Anterior Nasal Swab  Result Value Ref Range Status   SARS Coronavirus 2 by RT PCR NEGATIVE NEGATIVE Final    Comment: Performed at [facility], [address] [place], [place] [address]    Labs: CBC: Recent Labs  Lab 08/03/22 0858 08/03/22 0909 08/04/22 0032  WBC 7.6  --  8.5  NEUTROABS 5.4  --   --   HGB 13.5 13.6 13.2  HCT 39.3 40.0 37.7*  MCV 91.4  --  90.2  PLT 294  --  299   Basic Metabolic Panel: Recent Labs  Lab 08/03/22 0858 08/03/22 0909 08/03/22 1023 08/04/22 0032  NA 138 139  --  136  K 2.9* 3.1*  --  3.2*  CL 102  --   --  100  CO2 24  --   --  24  GLUCOSE 113*  --   --  117*  BUN 12  --   --  12  CREATININE 1.08  --   --  1.12  CALCIUM 8.8*  --   --  8.9  MG  --   --  1.9  --    Liver Function Tests: Recent Labs  Lab 08/03/22 0858  AST 26  ALT 17  ALKPHOS 85  BILITOT 0.8  PROT 6.9  ALBUMIN 3.6   CBG: No results for input(s): "GLUCAP" in the last 168 hours.  Discharge time spent: greater than 30 minutes.  Signed: Coralie Keens, MD [facility] 08/04/2022

## 2022-08-04 NOTE — Plan of Care (Signed)
Patient ID: HOUA READE, male   DOB: Apr 08, 1945, 76 y.o.   MRN: [id number]  Problem: Education: Goal: Ability to demonstrate management of disease process will improve Outcome: Adequate for Discharge Goal: Ability to verbalize understanding of medication therapies will improve Outcome: Adequate for Discharge Goal: Individualized Educational Video(s) Outcome: Adequate for Discharge   Problem: Activity: Goal: Capacity to carry out activities will improve Outcome: Adequate for Discharge   Problem: Cardiac: Goal: Ability to achieve and maintain adequate cardiopulmonary perfusion will improve Outcome: Adequate for Discharge   Problem: Education: Goal: Knowledge of disease or condition will improve Outcome: Adequate for Discharge Goal: Knowledge of the prescribed therapeutic regimen will improve Outcome: Adequate for Discharge Goal: Individualized Educational Video(s) Outcome: Adequate for Discharge   Problem: Activity: Goal: Ability to tolerate increased activity will improve Outcome: Adequate for Discharge Goal: Will verbalize the importance of balancing activity with adequate rest periods Outcome: Adequate for Discharge   Problem: Respiratory: Goal: Ability to maintain a clear airway will improve Outcome: Adequate for Discharge Goal: Levels of oxygenation will improve Outcome: Adequate for Discharge Goal: Ability to maintain adequate ventilation will improve Outcome: Adequate for Discharge   Problem: Education: Goal: Knowledge of General Education information will improve Description: Including pain rating scale, medication(s)/side effects and non-pharmacologic comfort measures Outcome: Adequate for Discharge   Problem: Health Behavior/Discharge Planning: Goal: Ability to manage health-related needs will improve Outcome: Adequate for Discharge   Problem: Clinical Measurements: Goal: Ability to maintain clinical measurements within normal limits will improve Outcome:  Adequate for Discharge Goal: Will remain free from infection Outcome: Adequate for Discharge Goal: Diagnostic test results will improve Outcome: Adequate for Discharge Goal: Respiratory complications will improve Outcome: Adequate for Discharge Goal: Cardiovascular complication will be avoided Outcome: Adequate for Discharge   Problem: Activity: Goal: Risk for activity intolerance will decrease Outcome: Adequate for Discharge   Problem: Nutrition: Goal: Adequate nutrition will be maintained Outcome: Adequate for Discharge   Problem: Coping: Goal: Level of anxiety will decrease Outcome: Adequate for Discharge   Problem: Elimination: Goal: Will not experience complications related to bowel motility Outcome: Adequate for Discharge Goal: Will not experience complications related to urinary retention Outcome: Adequate for Discharge   Problem: Pain Managment: Goal: General experience of comfort will improve Outcome: Adequate for Discharge   Problem: Safety: Goal: Ability to remain free from injury will improve Outcome: Adequate for Discharge   Problem: Skin Integrity: Goal: Risk for impaired skin integrity will decrease Outcome: Adequate for Discharge   Problem: Education: Goal: Ability to demonstrate management of disease process will improve Outcome: Adequate for Discharge Goal: Ability to verbalize understanding of medication therapies will improve Outcome: Adequate for Discharge Goal: Individualized Educational Video(s) Outcome: Adequate for Discharge   Problem: Activity: Goal: Capacity to carry out activities will improve Outcome: Adequate for Discharge   Problem: Cardiac: Goal: Ability to achieve and maintain adequate cardiopulmonary perfusion will improve Outcome: Adequate for Discharge   Problem: Education: Goal: Ability to demonstrate management of disease process will improve Outcome: Adequate for Discharge Goal: Ability to verbalize understanding of  medication therapies will improve Outcome: Adequate for Discharge Goal: Individualized Educational Video(s) Outcome: Adequate for Discharge   Problem: Activity: Goal: Capacity to carry out activities will improve Outcome: Adequate for Discharge   Problem: Cardiac: Goal: Ability to achieve and maintain adequate cardiopulmonary perfusion will improve Outcome: Adequate for Discharge  Lidia Collum, RN

## 2022-08-04 NOTE — Progress Notes (Signed)
Patient ID: Eric Cook, male   DOB: 1945-03-21, 77 y.o.   MRN: [id number]  Patient unable to tolerate IV Potasium. MD notified. Icepack IV site.

## 2022-08-04 NOTE — Assessment & Plan Note (Signed)
Continue with simvastatin 

## 2022-08-04 NOTE — TOC Transition Note (Signed)
Transition of Care [facility]) - CM/SW Discharge Note   Patient Details  Name: Eric Cook MRN: [id number] Date of Birth: 1945/08/19  Transition of Care [facility]) CM/SW Contact:  Ronny Bacon, RN Phone Number: 08/04/2022, 3:30 PM   Clinical Narrative:  Patient is being discharged home today. Family at bedside will transport patient home.     Final next level of care: Home/Self Care Barriers to Discharge: No Barriers Identified   Patient Goals and CMS Choice      Discharge Placement                         Discharge Plan and Services Additional resources added to the After Visit Summary for                                       Social Determinants of Health (SDOH) Interventions SDOH Screenings   Food Insecurity: No Food Insecurity (08/03/2022)  Housing: Low Risk  (08/03/2022)  Transportation Needs: No Transportation Needs (08/03/2022)  Utilities: Not At Risk (08/03/2022)  Alcohol Screen: Low Risk  (06/22/2022)  Depression (PHQ2-9): Low Risk  (06/22/2022)  Financial Resource Strain: Low Risk  (06/22/2022)  Physical Activity: Sufficiently Active (06/22/2022)  Social Connections: Socially Integrated (06/22/2022)  Stress: No Stress Concern Present (06/22/2022)  Tobacco Use: Medium Risk (08/03/2022)     Readmission Risk Interventions     No data to display

## 2022-08-04 NOTE — Progress Notes (Signed)
Echocardiogram 2D Echocardiogram has been performed.  Eric Cook 08/04/2022, 9:07 AM

## 2022-08-04 NOTE — Assessment & Plan Note (Signed)
Patient with significant centrilobular emphysema.  Will continue bronchodilator therapy.

## 2022-08-04 NOTE — Assessment & Plan Note (Signed)
Continue with keppra.  

## 2022-08-04 NOTE — Assessment & Plan Note (Signed)
Renal function stable, at the time of her discharge her serum cr was 1,1 with K at 3,2 and serum bicarbonate at 24. Na 136 and Mg 1,9  Patient received a total of 90 meq Kcl today, and will follow up renal function and electrolytes as outpatient.

## 2022-08-04 NOTE — Plan of Care (Signed)
  Problem: Respiratory: Goal: Ability to maintain a clear airway will improve Outcome: Progressing   Problem: Respiratory: Goal: Levels of oxygenation will improve Outcome: Progressing   Problem: Nutrition: Goal: Adequate nutrition will be maintained Outcome: Progressing   Problem: Coping: Goal: Level of anxiety will decrease Outcome: Progressing   Problem: Pain Managment: Goal: General experience of comfort will improve Outcome: Progressing   Problem: Skin Integrity: Goal: Risk for impaired skin integrity will decrease Outcome: Progressing

## 2022-08-06 ENCOUNTER — Other Ambulatory Visit: Payer: Self-pay | Admitting: Internal Medicine

## 2022-08-06 DIAGNOSIS — R1310 Dysphagia, unspecified: Secondary | ICD-10-CM

## 2022-08-06 DIAGNOSIS — K222 Esophageal obstruction: Secondary | ICD-10-CM

## 2022-08-06 DIAGNOSIS — K219 Gastro-esophageal reflux disease without esophagitis: Secondary | ICD-10-CM

## 2022-08-06 DIAGNOSIS — K209 Esophagitis, unspecified without bleeding: Secondary | ICD-10-CM

## 2022-08-07 ENCOUNTER — Encounter: Payer: Self-pay | Admitting: Family Medicine

## 2022-08-07 ENCOUNTER — Ambulatory Visit (INDEPENDENT_AMBULATORY_CARE_PROVIDER_SITE_OTHER): Payer: Medicare HMO | Admitting: Family Medicine

## 2022-08-07 VITALS — BP 115/63 | HR 96 | Temp 98.5°F | Ht 70.0 in | Wt 209.0 lb

## 2022-08-07 DIAGNOSIS — J449 Chronic obstructive pulmonary disease, unspecified: Secondary | ICD-10-CM | POA: Diagnosis not present

## 2022-08-07 DIAGNOSIS — E878 Other disorders of electrolyte and fluid balance, not elsewhere classified: Secondary | ICD-10-CM | POA: Diagnosis not present

## 2022-08-07 DIAGNOSIS — I5031 Acute diastolic (congestive) heart failure: Secondary | ICD-10-CM

## 2022-08-07 DIAGNOSIS — Z91199 Patient's noncompliance with other medical treatment and regimen due to unspecified reason: Secondary | ICD-10-CM | POA: Diagnosis not present

## 2022-08-07 DIAGNOSIS — R6889 Other general symptoms and signs: Secondary | ICD-10-CM | POA: Diagnosis not present

## 2022-08-07 MED ORDER — IPRATROPIUM BROMIDE 0.02 % IN SOLN
0.5000 mg | Freq: Once | RESPIRATORY_TRACT | Status: AC
Start: 2022-08-07 — End: 2022-08-07
  Administered 2022-08-07: 0.5 mg via RESPIRATORY_TRACT

## 2022-08-07 MED ORDER — PREDNISONE 20 MG PO TABS
40.0000 mg | ORAL_TABLET | Freq: Every day | ORAL | 0 refills | Status: AC
Start: 2022-08-07 — End: 2022-08-12

## 2022-08-07 MED ORDER — DOXYCYCLINE HYCLATE 100 MG PO TABS
100.0000 mg | ORAL_TABLET | Freq: Two times a day (BID) | ORAL | 0 refills | Status: AC
Start: 2022-08-07 — End: 2022-08-14

## 2022-08-07 MED ORDER — METHYLPREDNISOLONE ACETATE 40 MG/ML IJ SUSP
40.0000 mg | Freq: Once | INTRAMUSCULAR | Status: AC
Start: 2022-08-07 — End: 2022-08-07
  Administered 2022-08-07: 40 mg via INTRAMUSCULAR

## 2022-08-07 NOTE — Progress Notes (Signed)
Acute Office Visit  Subjective:  Patient ID: Eric Cook, male    DOB: May 14, 1945, 77 y.o.   MRN: [id number]  Chief Complaint  Patient presents with   Hospitalization Follow-up    Congestive Hear Failure Shortness of Breath   HPI  Patient is in today for hospital follow up.  HPI completed by patient history and chart review.  Patient presented to ED on 08/03/22 for weakness/fatigue, DOE, over the past 2-3 days. He was admitted 08/03/22-08/04/22. He was diagnosed with diastolic HF with preserved EF. He was placed on Losartan and spironolactone. He received IV diuresis and was discharged. Patient was instructed to follow up with PCP, pulmonology, and cardiology. Patient is not established with cardiology and needs a referral. Patient is established with pulmonology.   Patient states that he had "fluid built up" in his body and heart. States that he could not sleep in hospital bed due to back pain. He complains of bruising on his ribs from echocardiogram.  Reports that he is "hurting all over" and cannot sleep due to the pain. Reports that he is monitoring his weight daily and taking lasix as prescribed.  States that in 2 weeks he will need refill orders placed for medications through [facility].  Reports that this morning he was monitoring his O2 at home and it has been "in the green"  Reports that he is fatigued, tired of being stuck by needles.  Stated that he would not return to the hospital, stating "they like to killed me"  He was not sent home with O2 supplies   ROS As per HPI   Objective:  BP 115/63   Pulse 96   Temp 98.5 F (36.9 C)   Ht 5\' 10"  (1.778 m)   Wt 209 lb (94.8 kg)   SpO2 (!) 87%   BMI 29.99 kg/m   Physical Exam Constitutional:      General: He is awake. He is not in acute distress.    Appearance: Normal appearance. He is well-developed, well-groomed and overweight. He is ill-appearing. He is not toxic-appearing or diaphoretic.  Cardiovascular:     Rate  and Rhythm: Normal rate and regular rhythm.     Pulses: Normal pulses.          Radial pulses are 2+ on the right side and 2+ on the left side.       Posterior tibial pulses are 2+ on the right side and 2+ on the left side.     Heart sounds: Normal heart sounds. No murmur heard.    No gallop.  Pulmonary:     Effort: Pulmonary effort is normal. No respiratory distress.     Breath sounds: Decreased air movement present. No stridor or transmitted upper airway sounds. Examination of the right-upper field reveals decreased breath sounds, rhonchi and rales. Examination of the left-upper field reveals decreased breath sounds, rhonchi and rales. Examination of the right-middle field reveals decreased breath sounds, rhonchi and rales. Examination of the left-middle field reveals decreased breath sounds, rhonchi and rales. Examination of the right-lower field reveals decreased breath sounds, rhonchi and rales. Examination of the left-lower field reveals decreased breath sounds, rhonchi and rales. Decreased breath sounds, rhonchi and rales present. No wheezing.  Musculoskeletal:     Cervical back: Full passive range of motion without pain and neck supple.     Right lower leg: No edema.     Left lower leg: No edema.  Skin:    General: Skin is warm.  Capillary Refill: Capillary refill takes less than 2 seconds.  Neurological:     General: No focal deficit present.     Mental Status: He is alert, oriented to person, place, and time and easily aroused. Mental status is at baseline.     GCS: GCS eye subscore is 4. GCS verbal subscore is 5. GCS motor subscore is 6.     Motor: No weakness.  Psychiatric:        Attention and Perception: Attention and perception normal.        Mood and Affect: Mood and affect normal.        Speech: Speech normal.        Behavior: Behavior is uncooperative.        Thought Content: Thought content normal. Thought content does not include homicidal or suicidal ideation.  Thought content does not include homicidal or suicidal plan.        Cognition and Memory: Cognition and memory normal.        Judgment: Judgment normal.       08/07/2022    1:31 PM 06/22/2022    9:49 AM 05/21/2022    4:05 PM  Depression screen PHQ 2/9  Decreased Interest 0 0 0  Down, Depressed, Hopeless 0 0 0  PHQ - 2 Score 0 0 0  Altered sleeping 0 0 2  Tired, decreased energy 0 0 0  Change in appetite 0 0 1  Feeling bad or failure about yourself  0 0 1  Trouble concentrating 0 0 0  Moving slowly or fidgety/restless 0 0 0  Suicidal thoughts 0 0 0  PHQ-9 Score 0 0 4  Difficult doing work/chores Not difficult at all Not difficult at all Somewhat difficult      08/07/2022    1:32 PM 05/21/2022    4:05 PM 10/18/2021   10:26 AM 04/20/2021    3:09 PM  GAD 7 : Generalized Anxiety Score  Nervous, Anxious, on Edge 0 0 0 0  Control/stop worrying 0 0 0 0  Worry too much - different things 0 0 0 0  Trouble relaxing 0 0 0 0  Restless 0 0 0 0  Easily annoyed or irritable 0 0 0 0  Afraid - awful might happen 0 0 0 0  Total GAD 7 Score 0 0 0 0  Anxiety Difficulty Not difficult at all Not difficult at all Not difficult at all Not difficult at all    Assessment & Plan:  1. Acute heart failure with preserved ejection fraction (HCC) Labs as below. Switched to future as patient did not want to complete them today. Will communicate results to patient once available.  Referral placed as below to Cardiology.  - Ambulatory referral to Cardiology; Future - Brain natriuretic peptide; Future - Brain natriuretic peptide  2. Electrolyte abnormality Labs as below. Will communicate results to patient once available.  Lab orders made future as patient did not want to complete today.  - Anemia Profile B; Future - Magnesium; Future - CMP14+EGFR; Future - CMP14+EGFR - Magnesium - Anemia Profile B  3. Chronic obstructive pulmonary disease, unspecified COPD type (HCC) Given history of COPD, will  start prednisone and antibiotic as below to cover for acute exacerbation. Provided patient with steroid injection today. Instructed patient to start prednison burst tomorrow. Patient refused repeat CXR.  Patient was provided nebulizer treatment during visit. O2 saturation improved minimally to 92%. However, he continued to have episodes of desaturation to 88%. Encouraged and recommended patient present  to ED for evaluation of abnormal vital signs. Patient declined. Signed AMA. Discussed red flag symptoms of when to present to ED.  - predniSONE (DELTASONE) 20 MG tablet; Take 2 tablets (40 mg total) by mouth daily with breakfast for 5 days.  Dispense: 10 tablet; Refill: 0 - doxycycline (VIBRA-TABS) 100 MG tablet; Take 1 tablet (100 mg total) by mouth 2 (two) times daily for 7 days.  Dispense: 14 tablet; Refill: 0 - ipratropium (ATROVENT) nebulizer solution 0.5 mg - methylPREDNISolone acetate (DEPO-MEDROL) injection 40 mg  4. Medically noncompliant Patient refused repeat CXR. Patient adamant that he would not follow up with ED. Patient signed AMA.   The above assessment and management plan was discussed with the patient. The patient verbalized understanding of and has agreed to the management plan using shared-decision making. Patient is aware to call the clinic if they develop any new symptoms or if symptoms fail to improve or worsen. Patient is aware when to return to the clinic for a follow-up visit. Patient educated on when it is appropriate to go to the emergency department.   Return in about 1 week (around 08/14/2022) for follow up COPD exacerbation . Scheduled with Dr. Elmarie Mainland, DNP-FNP [facility] [address] [place], [place] [address] [phone]

## 2022-08-08 LAB — ANEMIA PROFILE B
Basophils Absolute: 0.1 10*3/uL (ref 0.0–0.2)
Basos: 1 %
EOS (ABSOLUTE): 0.2 10*3/uL (ref 0.0–0.4)
Eos: 3 %
Ferritin: 649 ng/mL — ABNORMAL HIGH (ref 30–400)
Folate: 8.3 ng/mL (ref >3.0)
Hematocrit: 41.6 % (ref 37.5–51.0)
Hemoglobin: 14.2 g/dL (ref 13.0–17.7)
Immature Grans (Abs): 0 10*3/uL (ref 0.0–0.1)
Immature Granulocytes: 1 %
Iron Saturation: 14 % — ABNORMAL LOW (ref 15–55)
Iron: 43 ug/dL (ref 38–169)
Lymphocytes Absolute: 1.2 10*3/uL (ref 0.7–3.1)
Lymphs: 18 %
MCH: 32.3 pg (ref 26.6–33.0)
MCHC: 34.1 g/dL (ref 31.5–35.7)
MCV: 95 fL (ref 79–97)
Monocytes Absolute: 0.9 10*3/uL (ref 0.1–0.9)
Monocytes: 13 %
Neutrophils Absolute: 4.2 10*3/uL (ref 1.4–7.0)
Neutrophils: 64 %
Platelets: 338 10*3/uL (ref 150–450)
RBC: 4.39 x10E6/uL (ref 4.14–5.80)
RDW: 12.3 % (ref 11.6–15.4)
Retic Ct Pct: 2.2 % (ref 0.6–2.6)
Total Iron Binding Capacity: 305 ug/dL (ref 250–450)
UIBC: 262 ug/dL (ref 111–343)
Vitamin B-12: 332 pg/mL (ref 232–1245)
WBC: 6.6 10*3/uL (ref 3.4–10.8)

## 2022-08-08 LAB — MAGNESIUM: Magnesium: 2.1 mg/dL (ref 1.6–2.3)

## 2022-08-08 LAB — CMP14+EGFR
ALT: 29 IU/L (ref 0–44)
AST: 52 IU/L — ABNORMAL HIGH (ref 0–40)
Albumin: 4.7 g/dL (ref 3.8–4.8)
Alkaline Phosphatase: 112 IU/L (ref 44–121)
BUN/Creatinine Ratio: 14 (ref 10–24)
BUN: 15 mg/dL (ref 8–27)
Bilirubin Total: 0.8 mg/dL (ref 0.0–1.2)
CO2: 21 mmol/L (ref 20–29)
Calcium: 9.9 mg/dL (ref 8.6–10.2)
Chloride: 97 mmol/L (ref 96–106)
Creatinine, Ser: 1.1 mg/dL (ref 0.76–1.27)
Globulin, Total: 2.9 g/dL (ref 1.5–4.5)
Glucose: 106 mg/dL — ABNORMAL HIGH (ref 70–99)
Potassium: 4.4 mmol/L (ref 3.5–5.2)
Sodium: 137 mmol/L (ref 134–144)
Total Protein: 7.6 g/dL (ref 6.0–8.5)
eGFR: 70 mL/min/{1.73_m2} (ref >59)

## 2022-08-08 LAB — BRAIN NATRIURETIC PEPTIDE: BNP: 6.7 pg/mL (ref 0.0–100.0)

## 2022-08-09 ENCOUNTER — Emergency Department (HOSPITAL_COMMUNITY): Payer: Medicare HMO

## 2022-08-09 ENCOUNTER — Other Ambulatory Visit: Payer: Self-pay

## 2022-08-09 ENCOUNTER — Emergency Department (HOSPITAL_COMMUNITY)
Admission: EM | Admit: 2022-08-09 | Discharge: 2022-08-09 | Disposition: A | Discharge status: Home / Self Care | Payer: Medicare HMO | Attending: Emergency Medicine | Admitting: Emergency Medicine

## 2022-08-09 ENCOUNTER — Telehealth: Payer: Self-pay

## 2022-08-09 ENCOUNTER — Other Ambulatory Visit: Payer: Self-pay | Admitting: *Deleted

## 2022-08-09 ENCOUNTER — Telehealth: Payer: Self-pay | Admitting: Family Medicine

## 2022-08-09 ENCOUNTER — Encounter (HOSPITAL_COMMUNITY): Payer: Self-pay

## 2022-08-09 DIAGNOSIS — R531 Weakness: Secondary | ICD-10-CM | POA: Diagnosis not present

## 2022-08-09 DIAGNOSIS — Z79899 Other long term (current) drug therapy: Secondary | ICD-10-CM | POA: Insufficient documentation

## 2022-08-09 DIAGNOSIS — R899 Unspecified abnormal finding in specimens from other organs, systems and tissues: Secondary | ICD-10-CM

## 2022-08-09 DIAGNOSIS — I110 Hypertensive heart disease with heart failure: Secondary | ICD-10-CM | POA: Diagnosis not present

## 2022-08-09 DIAGNOSIS — Z20822 Contact with and (suspected) exposure to covid-19: Secondary | ICD-10-CM | POA: Diagnosis not present

## 2022-08-09 DIAGNOSIS — Z7951 Long term (current) use of inhaled steroids: Secondary | ICD-10-CM | POA: Diagnosis not present

## 2022-08-09 DIAGNOSIS — I700 Atherosclerosis of aorta: Secondary | ICD-10-CM | POA: Diagnosis not present

## 2022-08-09 DIAGNOSIS — I509 Heart failure, unspecified: Secondary | ICD-10-CM | POA: Insufficient documentation

## 2022-08-09 DIAGNOSIS — R0602 Shortness of breath: Secondary | ICD-10-CM | POA: Diagnosis not present

## 2022-08-09 DIAGNOSIS — F419 Anxiety disorder, unspecified: Secondary | ICD-10-CM | POA: Diagnosis not present

## 2022-08-09 DIAGNOSIS — J449 Chronic obstructive pulmonary disease, unspecified: Secondary | ICD-10-CM | POA: Diagnosis not present

## 2022-08-09 DIAGNOSIS — R0600 Dyspnea, unspecified: Secondary | ICD-10-CM | POA: Diagnosis not present

## 2022-08-09 DIAGNOSIS — R0902 Hypoxemia: Secondary | ICD-10-CM | POA: Diagnosis not present

## 2022-08-09 DIAGNOSIS — I771 Stricture of artery: Secondary | ICD-10-CM | POA: Diagnosis not present

## 2022-08-09 LAB — CBC
HCT: 43.6 % (ref 39.0–52.0)
Hemoglobin: 14.5 g/dL (ref 13.0–17.0)
MCH: 31.4 pg (ref 26.0–34.0)
MCHC: 33.3 g/dL (ref 30.0–36.0)
MCV: 94.4 fL (ref 80.0–100.0)
Platelets: 399 10*3/uL (ref 150–400)
RBC: 4.62 MIL/uL (ref 4.22–5.81)
RDW: 12.2 % (ref 11.5–15.5)
WBC: 9.5 10*3/uL (ref 4.0–10.5)
nRBC: 0 % (ref 0.0–0.2)

## 2022-08-09 LAB — BASIC METABOLIC PANEL
Anion gap: 11 (ref 5–15)
BUN: 27 mg/dL — ABNORMAL HIGH (ref 8–23)
CO2: 22 mmol/L (ref 22–32)
Calcium: 10 mg/dL (ref 8.9–10.3)
Chloride: 99 mmol/L (ref 98–111)
Creatinine, Ser: 1.16 mg/dL (ref 0.61–1.24)
GFR, Estimated: >60 mL/min (ref >60)
Glucose, Bld: 142 mg/dL — ABNORMAL HIGH (ref 70–99)
Potassium: 4.6 mmol/L (ref 3.5–5.1)
Sodium: 132 mmol/L — ABNORMAL LOW (ref 135–145)

## 2022-08-09 LAB — TROPONIN I (HIGH SENSITIVITY)
Troponin I (High Sensitivity): 4 ng/L (ref <18)
Troponin I (High Sensitivity): 4 ng/L (ref <18)

## 2022-08-09 LAB — SARS CORONAVIRUS 2 BY RT PCR: SARS Coronavirus 2 by RT PCR: NEGATIVE

## 2022-08-09 MED ORDER — LOSARTAN POTASSIUM 25 MG PO TABS: 25.0000 mg | ORAL_TABLET | Freq: Every day | ORAL | 0 refills | Status: DC

## 2022-08-09 NOTE — Progress Notes (Signed)
AST and ferritin elevated. Most likely due to recent illness. Recommend repeating in 1-2 weeks. All other labs normal.

## 2022-08-09 NOTE — Telephone Encounter (Signed)
Pt had questions about which blood pressure medication he she be taking. Stated his son through one away. Advised pt to call Dr that prescribed the medication for clarification. Told pt both medications are listed on our end but would need clarification with other dr and if he neds to take both he will need a refill from other dr.

## 2022-08-09 NOTE — Discharge Instructions (Addendum)
Today your labs are reassuring, your blood work shows no evidence of any kind of kidney issues, heart issues.  Please follow-up with your primary care doctor for further evaluation.  Additionally your oxygen saturations are so good, that he should not need oxygen at this point.  If you have further questions please talk to your primary care doctor

## 2022-08-09 NOTE — ED Triage Notes (Signed)
Pt c/o int SOB and weakness, confusion started this morning.

## 2022-08-09 NOTE — Addendum Note (Signed)
Addended by: Neale Burly on: 08/09/2022 12:02 PM   Modules accepted: Orders

## 2022-08-09 NOTE — Telephone Encounter (Signed)
Patient was seen in the hospital and was supposed to have losartan (COZAAR) 25 MG tablet called in at discharge but the pharmacy never received this prescription. He was seen on 08/07/22 for a hospital follow up where this was discussed. Patient would like to have this medication called in to  [id number] - [place], [place] - [address] (Ph: [phone])   Please call patient to let him know.

## 2022-08-09 NOTE — Addendum Note (Signed)
Addended by: Dorene Sorrow on: 08/09/2022 03:25 PM   Modules accepted: Orders

## 2022-08-09 NOTE — Telephone Encounter (Signed)
One month supply sent to pts pharmacy per [facility]. Pts son made aware. Pt has a follow up scheduled 7/26

## 2022-08-09 NOTE — ED Notes (Signed)
Pt prefers to sit on side of the bed. Pt provided water

## 2022-08-09 NOTE — ED Provider Notes (Signed)
[place] EMERGENCY DEPARTMENT AT [facility] Provider Note   CSN: [id number] Arrival date & time: 08/09/22  1307     History  Chief Complaint  Patient presents with   Shortness of Breath    Eric Cook is a 77 y.o. male, history of CHF, COPD, hypertension, who presents to the ED secondary to an episode earlier this morning where he got very weak and felt like he was going to pass out after becoming very anxious and agitated about his blood pressure medication.  He states this morning very agitated, that he did not know if he was taking the right blood pressure medication, reached out to several people including a primary care doctor, in urgent care, and even called the hospital, and cannot get any answers.  Became very agitated and felt weak, so he went to urgent care, and they told him to go to the ER because he had some elevated T waves.  He states he is not having chest pain, shortness of breath, had any nausea or vomiting.  Just felt weak and after he got agitated this morning.  He has no swelling of his bilateral lower extremities.  And has been doing well at home.  Family at bedside states that they would like him to have oxygen at home for comfort. Home Medications Prior to Admission medications   Medication Sig Start Date End Date Taking? Authorizing Provider  acetaminophen (TYLENOL) 500 MG tablet Take 500 mg by mouth as needed for moderate pain or mild pain (As Needed).    [provider]  cimetidine (TAGAMET) 400 MG tablet TAKE 1 TABLET TWICE DAILY AS NEEDED 04/24/22   Delynn Flavin M, DO  dicyclomine (BENTYL) 10 MG capsule TAKE 2 CAPSULES (20 MG) 15 MINUTES BEFORE MEALS AS NEEDED 08/06/22   Hilarie Fredrickson, MD  doxycycline (VIBRA-TABS) 100 MG tablet Take 1 tablet (100 mg total) by mouth 2 (two) times daily for 7 days. 08/07/22 08/14/22  Milian, Aleen Campi, FNP  fluticasone (FLONASE) 50 MCG/ACT nasal spray USE 2 SPRAYS IN EACH NOSTRIL EVERY DAY 03/16/22    Delynn Flavin M, DO  fluticasone furoate-vilanterol (BREO ELLIPTA) 200-25 MCG/ACT AEPB Inhale 1 puff into the lungs daily. 10/19/21   Hunsucker, Lesia Sago, MD  furosemide (LASIX) 20 MG tablet Take 1 tablet (20 mg total) by mouth daily as needed for edema or fluid (take in case of weight gain 2 to 3 lbs in 24 hrs or 5 lbs in 7 days.). 08/04/22   Arrien, York Ram, MD  levETIRAcetam (KEPPRA XR) 500 MG 24 hr tablet Take 1 tablet (500 mg total) by mouth at bedtime. 02/22/22   Ihor Austin, NP  losartan (COZAAR) 25 MG tablet Take 1 tablet (25 mg total) by mouth daily. 08/09/22 09/08/22  MilianAleen Campi, FNP  pantoprazole (PROTONIX) 40 MG tablet Take 1 tablet (40 mg total) by mouth daily. Office visit for further refills 07/04/22   Hilarie Fredrickson, MD  predniSONE (DELTASONE) 20 MG tablet Take 2 tablets (40 mg total) by mouth daily with breakfast for 5 days. 08/07/22 08/12/22  Arrie Senate, FNP  simvastatin (ZOCOR) 20 MG tablet Take 1 tablet (20 mg total) by mouth daily at 6 PM. 10/18/21   Delynn Flavin M, DO  spironolactone (ALDACTONE) 25 MG tablet Take 0.5 tablets (12.5 mg total) by mouth daily. 08/05/22 09/04/22  Arrien, York Ram, MD  tamsulosin (FLOMAX) 0.4 MG CAPS capsule TAKE 1 CAPSULE (0.4 MG TOTAL) BY MOUTH DAILY. 06/06/22  Delynn Flavin M, DO      Allergies    Patient has no known allergies.    Review of Systems   Review of Systems  Constitutional:  Positive for fatigue.  Respiratory:  Negative for shortness of breath.   Cardiovascular:  Negative for chest pain.    Physical Exam Updated Vital Signs BP (!) 159/86 (BP Location: Right Arm)   Pulse 100   Temp 98.3 F (36.8 C) (Oral)   Resp (!) 30   Ht 5\' 10"  (1.778 m)   Wt 94.8 kg   SpO2 96%   BMI 29.99 kg/m  Physical Exam Vitals and nursing note reviewed.  Constitutional:      General: He is not in acute distress.    Appearance: He is well-developed.  HENT:     Head: Normocephalic and atraumatic.   Eyes:     Conjunctiva/sclera: Conjunctivae normal.  Cardiovascular:     Rate and Rhythm: Normal rate and regular rhythm.     Heart sounds: No murmur heard. Pulmonary:     Effort: Pulmonary effort is normal. No respiratory distress.     Breath sounds: Normal breath sounds.  Abdominal:     Palpations: Abdomen is soft.     Tenderness: There is no abdominal tenderness.  Musculoskeletal:        General: No swelling.     Cervical back: Neck supple.  Skin:    General: Skin is warm and dry.     Capillary Refill: Capillary refill takes less than 2 seconds.  Neurological:     Mental Status: He is alert.  Psychiatric:        Mood and Affect: Mood normal.     ED Results / Procedures / Treatments   Labs (all labs ordered are listed, but only abnormal results are displayed) Labs Reviewed  BASIC METABOLIC PANEL - Abnormal; Notable for the following components:      Result Value   Sodium 132 (*)    Glucose, Bld 142 (*)    BUN 27 (*)    All other components within normal limits  SARS CORONAVIRUS 2 BY RT PCR  CBC  TROPONIN I (HIGH SENSITIVITY)  TROPONIN I (HIGH SENSITIVITY)    EKG EKG Interpretation Date/Time:  Thursday August 09 2022 17:17:56 EDT Ventricular Rate:  82 PR Interval:  140 QRS Duration:  100 QT Interval:  393 QTC Calculation: 459 R Axis:   -19  Text Interpretation: Sinus rhythm Multiple premature complexes, vent & supraven Borderline left axis deviation Abnormal R-wave progression, early transition Confirmed by Fulton Reek [id number]) on 08/09/2022 5:42:56 PM  Radiology DG Chest 2 View  Result Date: 08/09/2022 CLINICAL DATA:  Shortness of breath. EXAM: CHEST - 2 VIEW COMPARISON:  08/03/2022. FINDINGS: No consolidation or pulmonary edema. Chronic interstitial prominence, unchanged. Stable cardiac and mediastinal contours with atherosclerotic calcifications and tortuosity of the thoracic aorta. Visualized bones and upper abdomen are unremarkable. IMPRESSION: No  evidence of acute cardiopulmonary disease. Chronic interstitial prominence. Aortic Atherosclerosis (ICD10-I70.0). Electronically Signed   By: Orvan Falconer M.D.   On: 08/09/2022 14:28    Procedures Procedures    Medications Ordered in ED Medications - No data to display  ED Course/ Medical Decision Making/ A&P                             Medical Decision Making Patient is a 77 year old male, here for an episode of weakness, when he is very agitated.  He is well-appearing, and he has no complaints right now.  No chest pain, shortness of breath, and just feels weak.  Will obtain urinalysis, troponins, CBC, CMP for further evaluation.  Amount and/or Complexity of Data Reviewed Labs: ordered.    Details: No acute abnormalities, troponins x 2 within normal limits Radiology: ordered.    Details: Chest x-ray clear, just atherosclerosis, no acute findings Discussion of management or test interpretation with external provider(s): Discussed with Dr. Earlene Plater, he evaluated patient, we obtained 2 troponins, and they were within normal limits.  Basic labs were unremarkable, patient is feeling well, we had him ambulate, and his oxygen was within normal limits.  He is oxygen when he was out walking was 94 to 96%, and so he does not meet criteria for home oxygen in the from the ER now.  I recommend that they follow-up with her primary care doctor for further evaluation.  If you have this has been a chronic ongoing problem, and nothing acute per the family.  Weakness is likely secondary to his anxiety attack, return precautions were emphasized.   Final Clinical Impression(s) / ED Diagnoses Final diagnoses:  Weakness    Rx / DC Orders ED Discharge Orders     None         Skyleigh Windle, Harley [place], PA 08/09/22 2149    Laurence Spates, MD 08/10/22 0045

## 2022-08-09 NOTE — Telephone Encounter (Signed)
Pt states that he feels like his BP is too high. States that "Ashly" increased his Spironolactone by half and he feels that it is not helping.  Feels like he may pass out. Pt states that his BP machine has broken and has no way to check his blood pressure.  Could not get more info for pt because he wanted to go Urgent Care and said thank you and hung up.  Advised pt that he did not need to drive and to have someone with him.

## 2022-08-10 ENCOUNTER — Encounter: Payer: Self-pay | Admitting: Family Medicine

## 2022-08-11 ENCOUNTER — Other Ambulatory Visit: Payer: Self-pay | Admitting: Family Medicine

## 2022-08-11 DIAGNOSIS — J301 Allergic rhinitis due to pollen: Secondary | ICD-10-CM

## 2022-08-12 ENCOUNTER — Encounter (INDEPENDENT_AMBULATORY_CARE_PROVIDER_SITE_OTHER): Payer: Medicare HMO | Admitting: Family Medicine

## 2022-08-12 DIAGNOSIS — F411 Generalized anxiety disorder: Secondary | ICD-10-CM

## 2022-08-13 MED ORDER — BUSPIRONE HCL 7.5 MG PO TABS: 7.5000 mg | ORAL_TABLET | Freq: Three times a day (TID) | ORAL | 0 refills | Status: DC

## 2022-08-13 NOTE — Telephone Encounter (Signed)
Please see the MyChart message reply(ies) for my assessment and plan.    This patient gave consent for this Medical Advice Message and is aware that it may result in a bill to [employer], as well as the possibility of receiving a bill for a co-payment or deductible. They are an established patient, but are not seeking medical advice exclusively about a problem treated during an in person or video visit in the last seven days. I did not recommend an in person or video visit within seven days of my reply.    I spent a total of 10 minutes cumulative time within 7 days through [employer].  Delynn Flavin, DO

## 2022-08-17 ENCOUNTER — Encounter: Payer: Self-pay | Admitting: Family Medicine

## 2022-08-17 ENCOUNTER — Other Ambulatory Visit: Payer: Self-pay | Admitting: Family Medicine

## 2022-08-17 ENCOUNTER — Ambulatory Visit (INDEPENDENT_AMBULATORY_CARE_PROVIDER_SITE_OTHER): Payer: Medicare HMO | Admitting: Family Medicine

## 2022-08-17 VITALS — BP 134/67 | HR 82 | Temp 98.5°F | Ht 70.0 in | Wt 208.0 lb

## 2022-08-17 DIAGNOSIS — E871 Hypo-osmolality and hyponatremia: Secondary | ICD-10-CM | POA: Diagnosis not present

## 2022-08-17 DIAGNOSIS — I5031 Acute diastolic (congestive) heart failure: Secondary | ICD-10-CM

## 2022-08-17 DIAGNOSIS — F411 Generalized anxiety disorder: Secondary | ICD-10-CM | POA: Diagnosis not present

## 2022-08-17 LAB — BASIC METABOLIC PANEL
BUN/Creatinine Ratio: 20 (ref 10–24)
BUN: 20 mg/dL (ref 8–27)
CO2: 22 mmol/L (ref 20–29)
Calcium: 9.8 mg/dL (ref 8.6–10.2)
Chloride: 100 mmol/L (ref 96–106)
Creatinine, Ser: 1 mg/dL (ref 0.76–1.27)
Glucose: 99 mg/dL (ref 70–99)
Potassium: 4.5 mmol/L (ref 3.5–5.2)
Sodium: 138 mmol/L (ref 134–144)
eGFR: 78 mL/min/{1.73_m2} (ref >59)

## 2022-08-17 MED ORDER — BUSPIRONE HCL 10 MG PO TABS: 10.0000 mg | ORAL_TABLET | Freq: Two times a day (BID) | ORAL | 0 refills | Status: DC

## 2022-08-17 MED ORDER — SPIRONOLACTONE 25 MG PO TABS
12.5000 mg | ORAL_TABLET | Freq: Every day | ORAL | Status: DC
Start: 2022-08-17 — End: 2022-09-11

## 2022-08-17 NOTE — Patient Instructions (Signed)
STOP Buspirone 7.5mg  START Buspirone 10mg  3 times per day. See me back in a few weeks for anxiety

## 2022-08-17 NOTE — Progress Notes (Signed)
Subjective: Eric Cook up DOE, [name], anxiety PCP: Eric Cook Eric Cook is a 77 y.o. male presenting to clinic today for:  1.  Follow-up [name], dyspnea on exertion, anxiety Patient is accompanied today's visit by his son.  He was seen in the ER for dyspnea and chest tightness.  This was thought to be secondary to anxiety attack as he had no other focal findings and was status posttreatment with COPD exacerbation medications as well as appeared to be euvolemic from a heart failure standpoint.  He has an appointment with cardiology in October.  He is compliant with all medications as prescribed at his hospital discharge for acute onset heart failure.  They will need a prescription for spironolactone.  The buspirone 7.5 mg 3 times daily that was sent via virtual visit seems to be helping but patient feels like the dose could be increased.  He asks for this today.  His son is in agreement.   ROS: Per HPI  No Known Allergies Past Medical History:  Diagnosis Date   Anxiety    HLD (hyperlipidemia)    Hyperlipidemia    Hypertension    Peptic ulcer    Seizures (HCC)    medication related    Current Outpatient Medications:    acetaminophen (TYLENOL) 500 MG tablet, Take 500 mg by mouth as needed for moderate pain or mild pain (As Needed)., Disp: , Rfl:    busPIRone (BUSPAR) 7.5 MG tablet, Take 1 tablet (7.5 mg total) by mouth 3 (three) times daily. For anxiety, Disp: 90 tablet, Rfl: 0   cimetidine (TAGAMET) 400 MG tablet, TAKE 1 TABLET TWICE DAILY AS NEEDED, Disp: 180 tablet, Rfl: 3   dicyclomine (BENTYL) 10 MG capsule, TAKE 2 CAPSULES (20 MG) 15 MINUTES BEFORE MEALS AS NEEDED, Disp: 90 capsule, Rfl: 3   fluticasone (FLONASE) 50 MCG/ACT nasal spray, USE 2 SPRAYS IN EACH NOSTRIL EVERY DAY, Disp: 48 each, Rfl: 3   fluticasone furoate-vilanterol (BREO ELLIPTA) 200-25 MCG/ACT AEPB, Inhale 1 puff into the lungs daily., Disp: 60 each, Rfl: 5   furosemide (LASIX) 20 MG  tablet, Take 1 tablet (20 mg total) by mouth daily as needed for edema or fluid (take in case of weight gain 2 to 3 lbs in 24 hrs or 5 lbs in 7 days.)., Disp: 30 tablet, Rfl: 0   levETIRAcetam (KEPPRA XR) 500 MG 24 hr tablet, Take 1 tablet (500 mg total) by mouth at bedtime., Disp: 90 tablet, Rfl: 3   losartan (COZAAR) 25 MG tablet, Take 1 tablet (25 mg total) by mouth daily., Disp: 30 tablet, Rfl: 0   pantoprazole (PROTONIX) 40 MG tablet, Take 1 tablet (40 mg total) by mouth daily. Office visit for further refills, Disp: 90 tablet, Rfl: 0   simvastatin (ZOCOR) 20 MG tablet, Take 1 tablet (20 mg total) by mouth daily at 6 PM., Disp: 90 tablet, Rfl: 3   spironolactone (ALDACTONE) 25 MG tablet, Take 0.5 tablets (12.5 mg total) by mouth daily., Disp: 15 tablet, Rfl: 0   tamsulosin (FLOMAX) 0.4 MG CAPS capsule, TAKE 1 CAPSULE (0.4 MG TOTAL) BY MOUTH DAILY., Disp: 90 capsule, Rfl: 0 Social History   Socioeconomic History   Marital status: Single    Spouse name: Not on file   Number of children: 1   Years of education: Not on file   Highest education level: Not on file  Occupational History   Occupation: Lobbyist    Comment: part time  Tobacco  Use   Smoking status: Former    Current packs/day: 0.00    Average packs/day: 1 pack/day for 54.0 years (54.0 ttl pk-yrs)    Types: Cigarettes    Start date: 04/1965    Quit date: 04/2019    Years since quitting: 3.3   Smokeless tobacco: Never  Vaping Use   Vaping status: Never Used  Substance and Sexual Activity   Alcohol use: Never   Drug use: Never   Sexual activity: Never  Other Topics Concern   Not on file  Social History Narrative   Lives alone   Right Handed   Drinks 2-3 cups of caffeine daily   Son in [place]   Social Determinants of Health   Financial Resource Strain: Low Risk  (06/22/2022)   Overall Financial Resource Strain (CARDIA)    Difficulty of Paying Living Expenses: Not hard at all  Food Insecurity: No  Food Insecurity (08/03/2022)   Hunger Vital Sign    Worried About Running Out of Food in the Last Year: Never true    Ran Out of Food in the Last Year: Never true  Transportation Needs: No Transportation Needs (08/03/2022)   PRAPARE - Administrator, Civil Service (Medical): No    Lack of Transportation (Non-Medical): No  Physical Activity: Sufficiently Active (06/22/2022)   Exercise Vital Sign    Days of Exercise per Week: 7 days    Minutes of Exercise per Session: 30 min  Stress: No Stress Concern Present (06/22/2022)   [employer] of Occupational Health - Occupational Stress Questionnaire    Feeling of Stress : Not at all  Social Connections: Socially Integrated (06/22/2022)   Social Connection and Isolation Panel [NHANES]    Frequency of Communication with Friends and Family: More than three times a week    Frequency of Social Gatherings with Friends and Family: More than three times a week    Attends Religious Services: More than 4 times per year    Active Member of [employer] or Organizations: Yes    Attends Engineer, structural: More than 4 times per year    Marital Status: Married  Catering manager Violence: Not At Risk (08/03/2022)   Humiliation, Afraid, Rape, and Kick questionnaire    Fear of Current or Ex-Partner: No    Emotionally Abused: No    Physically Abused: No    Sexually Abused: No   Family History  Problem Relation Age of Onset   Colon cancer Brother    Other Son        vertigo   Kidney Stones Son    Esophageal cancer Neg Hx    Pancreatic cancer Neg Hx    Stomach cancer Neg Hx     Objective: Office vital signs reviewed. BP 134/67   Pulse 82   Temp 98.5 F (36.9 C)   Ht 5\' 10"  (1.778 m)   Wt 208 lb (94.3 kg)   SpO2 93%   BMI 29.84 kg/m   Physical Examination:  General: Awake, alert, nontoxic male, No acute distress HEENT: no JVD Cardio: regular rate and rhythm, S1S2 heard, no murmurs appreciated Pulm: clear to auscultation  bilaterally, no wheezes, rhonchi or rales; normal work of breathing on room air GI: protuberant MSK: ambulating independently Psych: mood stable, speech normal    08/17/2022    9:10 AM 08/07/2022    1:31 PM 06/22/2022    9:49 AM  Depression screen PHQ 2/9  Decreased Interest 0 0 0  Down, Depressed, Hopeless  0 0 0  PHQ - 2 Score 0 0 0  Altered sleeping 1 0 0  Tired, decreased energy 2 0 0  Change in appetite 1 0 0  Feeling bad or failure about yourself  0 0 0  Trouble concentrating 0 0 0  Moving slowly or fidgety/restless 0 0 0  Suicidal thoughts 0 0 0  PHQ-9 Score 4 0 0  Difficult doing work/chores Not difficult at all Not difficult at all Not difficult at all      08/17/2022    9:10 AM 08/07/2022    1:32 PM 05/21/2022    4:05 PM 10/18/2021   10:26 AM  GAD 7 : Generalized Anxiety Score  Nervous, Anxious, on Edge 1 0 0 0  Control/stop worrying 1 0 0 0  Worry too much - different things 0 0 0 0  Trouble relaxing 0 0 0 0  Restless 1 0 0 0  Easily annoyed or irritable 0 0 0 0  Afraid - awful might happen 1 0 0 0  Total GAD 7 Score 4 0 0 0  Anxiety Difficulty Not difficult at all Not difficult at all Not difficult at all Not difficult at all      Assessment/ Plan: 77 y.o. male   Acute heart failure with preserved ejection fraction (HCC) - Plan: spironolactone (ALDACTONE) 25 MG tablet, Basic Metabolic Panel  Generalized anxiety disorder - Plan: DISCONTINUED: busPIRone (BUSPAR) 10 MG tablet  Hyponatremia - Plan: Basic Metabolic Panel  Check BMP given mild reduction in sodium level.  Buspar 10mg  TID.  Will reassess in 6-8 weeks and advance further if needed  Keep follow up with cardiology  No orders of the defined types were placed in this encounter.  No orders of the defined types were placed in this encounter.    Eric Cook [facility] [phone]

## 2022-08-17 NOTE — Telephone Encounter (Signed)
Name from pharmacy: BUSPIRONE HCL 10 MG TABLET   To pharmacy: DX Code Needed  PT PICKED THIS DRUG UP WITH THE WRONG DIRECTIONS, PT NEEDS NEW RX FOR 3 TIMES DAILY.

## 2022-08-21 ENCOUNTER — Ambulatory Visit: Payer: Medicare HMO | Admitting: Family Medicine

## 2022-08-22 ENCOUNTER — Telehealth: Payer: Self-pay | Admitting: Family Medicine

## 2022-08-22 NOTE — Telephone Encounter (Signed)
I spoke to pt's son and pt is c/o only sleeping 3 hours a night and when he wakes up he feels "closed in" and has to open doors. Pt says he has been having some soreness on the left side like he did when he was in the hospital-has not changed or gotten worse but causes him to have worsening anxiety. Pt wants to know if his Buspar can be increased further. Please advise.

## 2022-08-23 ENCOUNTER — Other Ambulatory Visit: Payer: Medicare HMO

## 2022-08-23 NOTE — Telephone Encounter (Signed)
lmtcb

## 2022-08-23 NOTE — Telephone Encounter (Signed)
Patient aware and verbalizes understanding. 

## 2022-08-23 NOTE — Telephone Encounter (Signed)
Not sure about the stiffness.  May need physical therapy.  However, he MAY increase that Buspar to 2 of the 7.5mg  tablets to equal 15mg  3 times per day.  Does he want a referral to [facility] for counseling?  If symptoms become severe, please make him aware of [facility] walk-in information.

## 2022-08-25 DIAGNOSIS — I110 Hypertensive heart disease with heart failure: Secondary | ICD-10-CM | POA: Diagnosis not present

## 2022-08-25 DIAGNOSIS — I509 Heart failure, unspecified: Secondary | ICD-10-CM | POA: Diagnosis not present

## 2022-08-25 DIAGNOSIS — I5032 Chronic diastolic (congestive) heart failure: Secondary | ICD-10-CM | POA: Diagnosis not present

## 2022-08-25 DIAGNOSIS — R058 Other specified cough: Secondary | ICD-10-CM | POA: Diagnosis not present

## 2022-08-25 DIAGNOSIS — R079 Chest pain, unspecified: Secondary | ICD-10-CM | POA: Diagnosis not present

## 2022-08-26 DIAGNOSIS — I491 Atrial premature depolarization: Secondary | ICD-10-CM | POA: Diagnosis not present

## 2022-08-27 ENCOUNTER — Ambulatory Visit (INDEPENDENT_AMBULATORY_CARE_PROVIDER_SITE_OTHER): Payer: Medicare HMO | Admitting: Professional Counselor

## 2022-08-27 ENCOUNTER — Telehealth: Payer: Self-pay | Admitting: Family Medicine

## 2022-08-27 DIAGNOSIS — F411 Generalized anxiety disorder: Secondary | ICD-10-CM

## 2022-08-27 DIAGNOSIS — R0602 Shortness of breath: Secondary | ICD-10-CM | POA: Diagnosis not present

## 2022-08-27 DIAGNOSIS — R002 Palpitations: Secondary | ICD-10-CM | POA: Diagnosis not present

## 2022-08-27 NOTE — Addendum Note (Signed)
Addended by: Raliegh Ip on: 08/27/2022 10:34 AM   Modules accepted: Orders

## 2022-08-27 NOTE — Patient Instructions (Signed)
If your symptoms worsen or you have thoughts of suicide/homicide, PLEASE SEEK IMMEDIATE MEDICAL ATTENTION.  You may always call:   National Suicide Hotline: 988 or 800-273-8255 [place] Crisis Line: 336-832-9700 Crisis Recovery in [place]: 800-939-5911      These are available 24 hours a day, 7 days a week.  

## 2022-08-27 NOTE — Telephone Encounter (Signed)
Patient aware and verbalizes understanding.  Would like referral with Arlys John and would like to see him asap.  Requesting to see him today if possible. Aware we will contact him about scheduling

## 2022-08-27 NOTE — Telephone Encounter (Signed)
I've advanced his buspar several times now.  Does he want to see Arlys John for counseling?

## 2022-08-27 NOTE — Telephone Encounter (Signed)
Reason for Referral:Not sleeping at night due to anxiety  Has the referral been discussed with the patient?: Per the patient, no   Designated contact for the referral if not the patient (name/phone number):   Has the patient seen a specialist for this issue before?: No   If so, who (practice/provider)?  Does the patient have a provider or location preference for the referral?: No Would the patient like to see previous specialist if applicable?   *Has appt on 09/14/22

## 2022-08-27 NOTE — BH Specialist Note (Signed)
Collaborative Care Initial Assessment  Session Start time: 2:30   Session End time: 3:30  Total time in minutes: 60 min   Type of Contact:  Face to face Patient consent obtained:  Yes Types of Service: Collaborative care  Summary  Patient is a 77 y.o. male being referred to collaborative care by his pcp for anxiety. Patient was engaged and cooperative in session.  Reason for referral in patient/family's own words:  "Panic and anxiety from a heart episode"  Patient's goal for today's visit: "I need something to help me sleep"  History of Present illness:   The patient is a 77 year old male with a history of anxiety. His chief complaint is having to visit the emergency room three times in the past three weeks due to heart failure and fluid buildup around his heart. These hospital visits have triggered PTSD-related symptoms, including trouble sleeping, fear of falling asleep, excessive worrying, intrusive thoughts about not waking up, panic, shortness of breath, and social isolation. He becomes upset when discussing these issues and is frustrated with his inability to find a solution, especially regarding sleep aids. He reports getting only three hours of sleep per night. The behavioral health counselor explained the collaborative care process and the need to consult with a psychiatrist and primary care doctor to determine the appropriate course of treatment. The patient was provided with cognitive behavioral techniques and mindfulness exercises to help manage his anxiety, as well as a sleep hygiene tip sheet to support natural sleep improvement. The patient expressed significant frustration but was informed that the collaborative care team would work on finding the safest and most effective treatment plan for his needs.  Clinical Assessment   PHQ-9 Assessments:    08/27/2022    2:51 PM 08/17/2022    9:10 AM 08/07/2022    1:31 PM 06/22/2022    9:49 AM 05/21/2022    4:05 PM  Depression  screen PHQ 2/9  Decreased Interest 2 0 0 0 0  Down, Depressed, Hopeless 2 0 0 0 0  PHQ - 2 Score 4 0 0 0 0  Altered sleeping 2 1 0 0 2  Tired, decreased energy 2 2 0 0 0  Change in appetite 2 1 0 0 1  Feeling bad or failure about yourself  2 0 0 0 1  Trouble concentrating 2 0 0 0 0  Moving slowly or fidgety/restless 2 0 0 0 0  Suicidal thoughts 2 0 0 0 0  PHQ-9 Score 18 4 0 0 4  Difficult doing work/chores  Not difficult at all Not difficult at all Not difficult at all Somewhat difficult    GAD-7 Assessments:    08/27/2022    2:52 PM 08/17/2022    9:10 AM 08/07/2022    1:32 PM 05/21/2022    4:05 PM  GAD 7 : Generalized Anxiety Score  Nervous, Anxious, on Edge 2 1 0 0  Control/stop worrying 2 1 0 0  Worry too much - different things 2 0 0 0  Trouble relaxing 2 0 0 0  Restless 2 1 0 0  Easily annoyed or irritable 3 0 0 0  Afraid - awful might happen 3 1 0 0  Total GAD 7 Score 16 4 0 0  Anxiety Difficulty Somewhat difficult Not difficult at all Not difficult at all Not difficult at all     Social History:  Household: Lives alone Marital status: Divorced Number of Children: 1 Employment: Retired Education:   Psychiatric Review of systems:  Insomnia: Afraid to sleep. Wakes up in a panic. Changes in appetite: Can't eat much it tears my stomach up. Nerves. Decreased need for sleep: No Family history of bipolar disorder: No Hallucinations: No   Paranoia: No    Psychotropic medications: Current medications: Busbar,  Patient taking medications as prescribed: Yes Side effects reported: No  Current medications (medication list) Current Outpatient Medications on File Prior to Visit  Medication Sig Dispense Refill   acetaminophen (TYLENOL) 500 MG tablet Take 500 mg by mouth as needed for moderate pain or mild pain (As Needed).     busPIRone (BUSPAR) 10 MG tablet Take 1 tablet (10 mg total) by mouth 3 (three) times daily. 270 tablet 0   cimetidine (TAGAMET) 400 MG tablet TAKE 1  TABLET TWICE DAILY AS NEEDED 180 tablet 3   dicyclomine (BENTYL) 10 MG capsule TAKE 2 CAPSULES (20 MG) 15 MINUTES BEFORE MEALS AS NEEDED 90 capsule 3   fluticasone (FLONASE) 50 MCG/ACT nasal spray USE 2 SPRAYS IN EACH NOSTRIL EVERY DAY 48 each 3   fluticasone furoate-vilanterol (BREO ELLIPTA) 200-25 MCG/ACT AEPB Inhale 1 puff into the lungs daily. 60 each 5   furosemide (LASIX) 20 MG tablet Take 1 tablet (20 mg total) by mouth daily as needed for edema or fluid (take in case of weight gain 2 to 3 lbs in 24 hrs or 5 lbs in 7 days.). 30 tablet 0   levETIRAcetam (KEPPRA XR) 500 MG 24 hr tablet Take 1 tablet (500 mg total) by mouth at bedtime. 90 tablet 3   losartan (COZAAR) 25 MG tablet Take 1 tablet (25 mg total) by mouth daily. 30 tablet 0   pantoprazole (PROTONIX) 40 MG tablet Take 1 tablet (40 mg total) by mouth daily. Office visit for further refills 90 tablet 0   simvastatin (ZOCOR) 20 MG tablet Take 1 tablet (20 mg total) by mouth daily at 6 PM. 90 tablet 3   spironolactone (ALDACTONE) 25 MG tablet Take 0.5 tablets (12.5 mg total) by mouth daily. 45 tablet 01   tamsulosin (FLOMAX) 0.4 MG CAPS capsule TAKE 1 CAPSULE (0.4 MG TOTAL) BY MOUTH DAILY. 90 capsule 0   No current facility-administered medications on file prior to visit.    Psychiatric History: Past psychiatry diagnosis: None Patient currently being seen by therapist/psychiatrist: No Prior Suicide Attempts: Denies Past psychiatry Hospitalization(s): Denies Past history of violence: Denies  Traumatic Experiences: History or current traumatic events (natural disaster, house fire, etc.)? no History or current physical trauma?  no History or current emotional trauma?  no History or current sexual trauma?  no History or current domestic or intimate partner violence?  no PTSD symptoms if any traumatic experiences no (Details: )  Alcohol and/or Substance Use History   Tobacco Alcohol Other substances  Current use Quit smoking 6  years ago Never Never  Past use     Past treatment      Withdrawal Potential: None  Self-harm Behaviors Risk Assessment Self-harm risk factors:  Low tisk Patient endorses recent thoughts of harming self:  Denies [place] Suicide Severity Rating Scale:   Guns in the home: No   Protective factors:   Danger to Others Risk Assessment Danger to others risk factors:  None Patient endorses recent thoughts of harming others: Denies  Consulting civil engineer discussed emergency crisis plan with client and provided local emergency services resources.  Mental status exam:   General Appearance Luretha Murphy:  Casual Eye Contact:  Fair Motor Behavior:  Normal Speech:  Pressured Level of  Consciousness:  Alert Mood:  Anxious Affect:  Appropriate Anxiety Level:  Moderate Thought Process:  Coherent Thought Content:  WNL Perception:  Normal Judgment:  Good Insight:  Present  Diagnosis:   Goals: Increase healthy adjustment to current life circumstances   Interventions: Mindfulness or Relaxation Training and CBT Cognitive Behavioral Therapy   Follow-up Plan:

## 2022-08-28 ENCOUNTER — Other Ambulatory Visit: Payer: Self-pay | Admitting: Family Medicine

## 2022-08-28 ENCOUNTER — Other Ambulatory Visit: Payer: Self-pay | Admitting: Cardiovascular Disease

## 2022-08-28 DIAGNOSIS — F411 Generalized anxiety disorder: Secondary | ICD-10-CM

## 2022-08-28 DIAGNOSIS — I5031 Acute diastolic (congestive) heart failure: Secondary | ICD-10-CM

## 2022-08-29 ENCOUNTER — Telehealth (INDEPENDENT_AMBULATORY_CARE_PROVIDER_SITE_OTHER): Payer: Medicare HMO | Admitting: Professional Counselor

## 2022-08-29 DIAGNOSIS — F411 Generalized anxiety disorder: Secondary | ICD-10-CM

## 2022-08-29 NOTE — Telephone Encounter (Signed)
No record of patient having been seen in our practice, seems to have med recently filled by PCP. Has new patient appt w/ Dr. Flora Lipps in [facility] 09/11/22. Called to ensure patient was able to get med filled, no answer. LM w/ no identifiers asking recipient to call us back.

## 2022-08-29 NOTE — BH Specialist Note (Signed)
Behavioral Health Treatment Plan Team Note  MRN: [id number] NAME: Eric Cook  DATE: 08/31/22  Start time: Start Time: 0320 End time: Stop Time: 0230 Total time: Total Time in Minutes (Visit): 10 Documentation time: 25 min Total Collaborative care time: 35 min Total number of Virtual BH Treatment Team Plan encounters: 1/4  Treatment Team Attendees: Dr. Vanetta Shawl and Esmond Harps  Collaborative Care Psychiatric Consultant Case Review    Assessment/Provisional Diagnosis Eric Cook is a 77 y.o. year old male with history of anxiety, newly diagnosed diastolic HF, Partial symptomatic epilepsy with complex partial seizures, COPD, hypertension, hyperlipidemia. The patient is referred for anxiety, insomnia.    Buspar was started and uptitrated by the PCP. According to the [facility] specialist, the patient reported a stable mood prior to the recent admission for cardiac heart failure.  He reported having an adverse reaction to mirtazapine.    # Unspecified anxiety disorder # Insomnia He is experiencing PTSD symptoms in the context of recent admission. Given that he was previously stable without pharmacological treatment for the past few years (having tapered off Xanax), he may only require a short-term intervention for anxiety and sleep, and would greatly benefit from CBT if he is interested. We will recommend trazodone as needed for insomnia. In the future, we may consider switching to sertraline if his mood symptoms worsen, while monitoring sodium levels if there is limited benefit from buspirone.  Goals, Interventions and Follow-up Plan Goals: Increase healthy adjustment to current life circumstances Interventions: Mindfulness or Relaxation Training CBT Cognitive Behavioral Therapy Medication Management Recommendations: Trazadone Follow-up Plan:  Weekly CBT and Mindfulness counseling  History of the present illness Presenting Problem/Current Symptoms:  The patient is a 77 year old male  with a history of anxiety. His chief complaint is having to visit the emergency room three times in the past three weeks due to heart failure and fluid buildup around his heart. These hospital visits have triggered PTSD-related symptoms, including trouble sleeping, fear of falling asleep, excessive worrying, intrusive thoughts about not waking up, panic, shortness of breath, and social isolation. He becomes upset when discussing these issues and is frustrated with his inability to find a solution, especially regarding sleep aids. He reports getting only three hours of sleep per night. The behavioral health counselor explained the collaborative care process and the need to consult with a psychiatrist and primary care doctor to determine the appropriate course of treatment. The patient was provided with cognitive behavioral techniques and mindfulness exercises to help manage his anxiety, as well as a sleep hygiene tip sheet to support natural sleep improvement. The patient expressed significant frustration but was informed that the collaborative care team would work on finding the safest and most effective treatment plan for his needs.    Screenings PHQ-9 Assessments:     08/27/2022    2:51 PM 08/17/2022    9:10 AM 08/07/2022    1:31 PM  Depression screen PHQ 2/9  Decreased Interest 2 0 0  Down, Depressed, Hopeless 2 0 0  PHQ - 2 Score 4 0 0  Altered sleeping 2 1 0  Tired, decreased energy 2 2 0  Change in appetite 2 1 0  Feeling bad or failure about yourself  2 0 0  Trouble concentrating 2 0 0  Moving slowly or fidgety/restless 2 0 0  Suicidal thoughts 2 0 0  PHQ-9 Score 18 4 0  Difficult doing work/chores  Not difficult at all Not difficult at all   GAD-7 Assessments:  08/27/2022    2:52 PM 08/17/2022    9:10 AM 08/07/2022    1:32 PM 05/21/2022    4:05 PM  GAD 7 : Generalized Anxiety Score  Nervous, Anxious, on Edge 2 1 0 0  Control/stop worrying 2 1 0 0  Worry too much - different things  2 0 0 0  Trouble relaxing 2 0 0 0  Restless 2 1 0 0  Easily annoyed or irritable 3 0 0 0  Afraid - awful might happen 3 1 0 0  Total GAD 7 Score 16 4 0 0  Anxiety Difficulty Somewhat difficult Not difficult at all Not difficult at all Not difficult at all    Past Medical History Past Medical History:  Diagnosis Date   Anxiety    HLD (hyperlipidemia)    Hyperlipidemia    Hypertension    Peptic ulcer    Seizures (HCC)    medication related    Vital signs: There were no vitals filed for this visit.  Allergies:  Allergies as of 08/29/2022   (No Known Allergies)    Medication History Current medications:  Outpatient Encounter Medications as of 08/29/2022  Medication Sig   acetaminophen (TYLENOL) 500 MG tablet Take 500 mg by mouth as needed for moderate pain or mild pain (As Needed).   busPIRone (BUSPAR) 10 MG tablet Take 1 tablet (10 mg total) by mouth 3 (three) times daily.   cimetidine (TAGAMET) 400 MG tablet TAKE 1 TABLET TWICE DAILY AS NEEDED   dicyclomine (BENTYL) 10 MG capsule TAKE 2 CAPSULES (20 MG) 15 MINUTES BEFORE MEALS AS NEEDED   fluticasone (FLONASE) 50 MCG/ACT nasal spray USE 2 SPRAYS IN EACH NOSTRIL EVERY DAY   fluticasone furoate-vilanterol (BREO ELLIPTA) 200-25 MCG/ACT AEPB Inhale 1 puff into the lungs daily.   furosemide (LASIX) 20 MG tablet Take 1 tablet (20 mg total) by mouth daily as needed for edema or fluid (take in case of weight gain 2 to 3 lbs in 24 hrs or 5 lbs in 7 days.).   levETIRAcetam (KEPPRA XR) 500 MG 24 hr tablet Take 1 tablet (500 mg total) by mouth at bedtime.   losartan (COZAAR) 25 MG tablet Take 1 tablet (25 mg total) by mouth daily.   pantoprazole (PROTONIX) 40 MG tablet Take 1 tablet (40 mg total) by mouth daily. Office visit for further refills   simvastatin (ZOCOR) 20 MG tablet Take 1 tablet (20 mg total) by mouth daily at 6 PM.   spironolactone (ALDACTONE) 25 MG tablet Take 0.5 tablets (12.5 mg total) by mouth daily.   tamsulosin  (FLOMAX) 0.4 MG CAPS capsule TAKE 1 CAPSULE (0.4 MG TOTAL) BY MOUTH DAILY.   No facility-administered encounter medications on file as of 08/29/2022.     Scribe for Treatment Team: Reuel Boom

## 2022-08-31 ENCOUNTER — Encounter: Payer: Self-pay | Admitting: Family Medicine

## 2022-08-31 ENCOUNTER — Ambulatory Visit (INDEPENDENT_AMBULATORY_CARE_PROVIDER_SITE_OTHER): Payer: Medicare HMO | Admitting: Family Medicine

## 2022-08-31 VITALS — BP 110/74 | HR 100 | Temp 98.6°F | Ht 70.0 in | Wt 206.0 lb

## 2022-08-31 DIAGNOSIS — R4589 Other symptoms and signs involving emotional state: Secondary | ICD-10-CM | POA: Diagnosis not present

## 2022-08-31 DIAGNOSIS — F5104 Psychophysiologic insomnia: Secondary | ICD-10-CM | POA: Diagnosis not present

## 2022-08-31 DIAGNOSIS — F411 Generalized anxiety disorder: Secondary | ICD-10-CM

## 2022-08-31 MED ORDER — SERTRALINE HCL 50 MG PO TABS: 50.0000 mg | ORAL_TABLET | Freq: Every day | ORAL | 0 refills | Status: DC

## 2022-08-31 MED ORDER — TRAZODONE HCL 50 MG PO TABS
25.0000 mg | ORAL_TABLET | Freq: Every evening | ORAL | 0 refills | Status: DC | PRN
Start: 2022-08-31 — End: 2022-11-21

## 2022-08-31 NOTE — Progress Notes (Signed)
Subjective: CC: Insomnia PCP: Raliegh Ip, DO Eric Cook is a 77 y.o. male presenting to clinic today for:  1.  Insomnia Initially felt to be related to generalized anxiety disorder precipitated by recent diagnosis of CHF.  He was placed on BuSpar at last visit this was advanced and even advanced 1 more time via telephone.  He has seen counseling and that appointment was reviewed by the counselor supervising psychiatrist to recommend CBT and consideration for initiation of mirtazapine, SSRI versus Seroquel or trazodone.  To summarize mirtazapine caused vivid dreams and he could not tolerate in the past.  Previously on Lexapro but this was held after being found to have slight reduction in sodium level.  After record review, he was on trazodone briefly at some point but uncertain as to why that was discontinued.   He presents today stating that he continues to have difficulty with sleep.  He reports feeling anxious during the daytime.  He reports that he has been having some headaches and GI upset with the BuSpar.  Apparently this has been ongoing but he did not tell anybody about it until he got on the higher doses.  He would like to change his medication up to something else.  He reports constantly worrying, especially about his heart.  He did secure a sooner appointment with cardiology at the end of the month and this is reassuring.   ROS: Per HPI  No Known Allergies Past Medical History:  Diagnosis Date   Anxiety    HLD (hyperlipidemia)    Hyperlipidemia    Hypertension    Peptic ulcer    Seizures (HCC)    medication related    Current Outpatient Medications:    acetaminophen (TYLENOL) 500 MG tablet, Take 500 mg by mouth as needed for moderate pain or mild pain (As Needed)., Disp: , Rfl:    busPIRone (BUSPAR) 10 MG tablet, Take 1 tablet (10 mg total) by mouth 3 (three) times daily., Disp: 270 tablet, Rfl: 0   cimetidine (TAGAMET) 400 MG tablet, TAKE 1 TABLET TWICE  DAILY AS NEEDED, Disp: 180 tablet, Rfl: 3   dicyclomine (BENTYL) 10 MG capsule, TAKE 2 CAPSULES (20 MG) 15 MINUTES BEFORE MEALS AS NEEDED, Disp: 90 capsule, Rfl: 3   fluticasone (FLONASE) 50 MCG/ACT nasal spray, USE 2 SPRAYS IN EACH NOSTRIL EVERY DAY, Disp: 48 each, Rfl: 3   fluticasone furoate-vilanterol (BREO ELLIPTA) 200-25 MCG/ACT AEPB, Inhale 1 puff into the lungs daily., Disp: 60 each, Rfl: 5   furosemide (LASIX) 20 MG tablet, Take 1 tablet (20 mg total) by mouth daily as needed for edema or fluid (take in case of weight gain 2 to 3 lbs in 24 hrs or 5 lbs in 7 days.)., Disp: 30 tablet, Rfl: 0   levETIRAcetam (KEPPRA XR) 500 MG 24 hr tablet, Take 1 tablet (500 mg total) by mouth at bedtime., Disp: 90 tablet, Rfl: 3   losartan (COZAAR) 25 MG tablet, Take 1 tablet (25 mg total) by mouth daily., Disp: 30 tablet, Rfl: 0   pantoprazole (PROTONIX) 40 MG tablet, Take 1 tablet (40 mg total) by mouth daily. Office visit for further refills, Disp: 90 tablet, Rfl: 0   simvastatin (ZOCOR) 20 MG tablet, Take 1 tablet (20 mg total) by mouth daily at 6 PM., Disp: 90 tablet, Rfl: 3   spironolactone (ALDACTONE) 25 MG tablet, Take 0.5 tablets (12.5 mg total) by mouth daily., Disp: 45 tablet, Rfl: 01   tamsulosin (FLOMAX) 0.4 MG CAPS  capsule, TAKE 1 CAPSULE (0.4 MG TOTAL) BY MOUTH DAILY., Disp: 90 capsule, Rfl: 0 Social History   Socioeconomic History   Marital status: Single    Spouse name: Not on file   Number of children: 1   Years of education: Not on file   Highest education level: Not on file  Occupational History   Occupation: Lobbyist    Comment: part time  Tobacco Use   Smoking status: Former    Current packs/day: 0.00    Average packs/day: 1 pack/day for 54.0 years (54.0 ttl pk-yrs)    Types: Cigarettes    Start date: 04/1965    Quit date: 04/2019    Years since quitting: 3.3   Smokeless tobacco: Never  Vaping Use   Vaping status: Never Used  Substance and Sexual Activity    Alcohol use: Never   Drug use: Never   Sexual activity: Never  Other Topics Concern   Not on file  Social History Narrative   Lives alone   Right Handed   Drinks 2-3 cups of caffeine daily   Son in [place]   Social Determinants of Health   Financial Resource Strain: Low Risk  (06/22/2022)   Overall Financial Resource Strain (CARDIA)    Difficulty of Paying Living Expenses: Not hard at all  Food Insecurity: No Food Insecurity (08/03/2022)   Hunger Vital Sign    Worried About Running Out of Food in the Last Year: Never true    Ran Out of Food in the Last Year: Never true  Transportation Needs: No Transportation Needs (08/03/2022)   PRAPARE - Administrator, Civil Service (Medical): No    Lack of Transportation (Non-Medical): No  Physical Activity: Sufficiently Active (06/22/2022)   Exercise Vital Sign    Days of Exercise per Week: 7 days    Minutes of Exercise per Session: 30 min  Stress: No Stress Concern Present (06/22/2022)   [employer] of Occupational Health - Occupational Stress Questionnaire    Feeling of Stress : Not at all  Social Connections: Socially Integrated (06/22/2022)   Social Connection and Isolation Panel [NHANES]    Frequency of Communication with Friends and Family: More than three times a week    Frequency of Social Gatherings with Friends and Family: More than three times a week    Attends Religious Services: More than 4 times per year    Active Member of [employer] or Organizations: Yes    Attends Engineer, structural: More than 4 times per year    Marital Status: Married  Catering manager Violence: Not At Risk (08/03/2022)   Humiliation, Afraid, Rape, and Kick questionnaire    Fear of Current or Ex-Partner: No    Emotionally Abused: No    Physically Abused: No    Sexually Abused: No   Family History  Problem Relation Age of Onset   Colon cancer Brother    Other Son        vertigo   Kidney Stones Son    Esophageal cancer  Neg Hx    Pancreatic cancer Neg Hx    Stomach cancer Neg Hx     Objective: Office vital signs reviewed. BP 110/74   Pulse 100   Temp 98.6 F (37 C)   Ht 5\' 10"  (1.778 m)   Wt 206 lb (93.4 kg)   SpO2 97%   BMI 29.56 kg/m   Physical Examination:  General: Awake, alert, well nourished, No acute distress HEENT: sclera white,  MMM Cardio: regular rate and rhythm, S1S2 heard, no murmurs appreciated Pulm: clear to auscultation bilaterally, no wheezes, rhonchi or rales; normal work of breathing on room air Psych: appears anxious when talking about being anxious     08/31/2022    2:17 PM 08/27/2022    2:51 PM 08/17/2022    9:10 AM  Depression screen PHQ 2/9  Decreased Interest 2 2 0  Down, Depressed, Hopeless 2 2 0  PHQ - 2 Score 4 4 0  Altered sleeping 2 2 1   Tired, decreased energy 2 2 2   Change in appetite 2 2 1   Feeling bad or failure about yourself  2 2 0  Trouble concentrating 2 2 0  Moving slowly or fidgety/restless 2 2 0  Suicidal thoughts 2 2 0  PHQ-9 Score 18 18 4   Difficult doing work/chores Very difficult  Not difficult at all      08/31/2022    2:25 PM 08/27/2022    2:52 PM 08/17/2022    9:10 AM 08/07/2022    1:32 PM  GAD 7 : Generalized Anxiety Score  Nervous, Anxious, on Edge 2 2 1  0  Control/stop worrying 2 2 1  0  Worry too much - different things 2 2 0 0  Trouble relaxing 2 2 0 0  Restless 2 2 1  0  Easily annoyed or irritable 3 3 0 0  Afraid - awful might happen 3 3 1  0  Total GAD 7 Score 16 16 4  0  Anxiety Difficulty Somewhat difficult Somewhat difficult Not difficult at all Not difficult at all   Assessment/ Plan: 77 y.o. male   Psychophysiological insomnia - Plan: traZODone (DESYREL) 50 MG tablet, Basic Metabolic Panel  GAD (generalized anxiety disorder) - Plan: sertraline (ZOLOFT) 50 MG tablet, Basic Metabolic Panel  Anxiety about health - Plan: sertraline (ZOLOFT) 50 MG tablet, Basic Metabolic Panel  We discussed risk versus benefits of  medication.  I would like him to have a close checkup for sodium and will order this to be collected at his visit on Monday.  I will CC Brein as FYI to send him to lab after their counseling visit.  Will try and get this checked again in a few weeks with their future counseling visit.  Standing order has been placed  Trazodone also given for as needed use for sleep.  Use only as needed.  Has an appointment with cardiology coming up in the month and I suspect they will want to get EKG given risk of QTc prolongation with both of these medicines.   Raliegh Ip, DO [facility] [phone]

## 2022-08-31 NOTE — Patient Instructions (Signed)
Sertraline (zoloft) 50mg  sent for anxiety.  This can take a couple of weeks to build in the system. Be patient.  Trazodone 50mg  sent for AS NEEDED use at bedtime.  You may cut it in half it if is too strong.  Keep appointment with Arlys John for counseling.

## 2022-09-03 ENCOUNTER — Ambulatory Visit: Payer: Medicare HMO | Admitting: Professional Counselor

## 2022-09-03 DIAGNOSIS — F411 Generalized anxiety disorder: Secondary | ICD-10-CM | POA: Diagnosis not present

## 2022-09-03 DIAGNOSIS — F430 Acute stress reaction: Secondary | ICD-10-CM

## 2022-09-03 DIAGNOSIS — R4589 Other symptoms and signs involving emotional state: Secondary | ICD-10-CM

## 2022-09-03 DIAGNOSIS — F5104 Psychophysiologic insomnia: Secondary | ICD-10-CM | POA: Diagnosis not present

## 2022-09-03 NOTE — BH Specialist Note (Signed)
[facility] Follow-up  MRN: [id number] NAME: Eric Cook Date: 09/03/22  Start time: Start Time: 0230 End time: Stop Time: 0300 Total time: Total Time in Minutes (Visit): 30 Call number: Visit Number: 3- Third Visit  Reason for call today:  The patient, a 77 year old male, presented for a collaborative care follow-up reporting a significantly improved overall mood. He noted a decrease in symptoms of panic and worry, improved sleep, and a generally positive outlook. The patient mentioned that he received the medication he had requested and feels that it has already helped him. He emphasized that his good mood has persisted throughout the week. The patient also took time to process the recent heart episode, which had initially led to a diagnosis of acute stress disorder. This trauma had caused symptoms such as avoidance, intrusive thoughts, feelings of reliving the event, and hypervigilance, all of which have significantly decreased over the past week. Additionally, the patient has become more active, engaging in activities such as mowing the lawn, tinkering in the garage, and playing with his car. His scores on the PHQ-9 and GAD-7 have significantly decreased, and he reports no side effects from the medication. The patient expressed encouragement by his progress. [facility] provided education on Acute Stress Disorder and discussed mindfulness practice from last week. A follow-up is scheduled in one week.   PHQ-9 Scores:     09/03/2022    2:40 PM 08/31/2022    2:17 PM 08/27/2022    2:51 PM 08/17/2022    9:10 AM 08/07/2022    1:31 PM  Depression screen PHQ 2/9  Decreased Interest 2 2 2  0 0  Down, Depressed, Hopeless 2 2 2  0 0  PHQ - 2 Score 4 4 4  0 0  Altered sleeping 0 2 2 1  0  Tired, decreased energy 0 2 2 2  0  Change in appetite 0 2 2 1  0  Feeling bad or failure about yourself  0 2 2 0 0  Trouble concentrating 0 2 2 0 0  Moving slowly or fidgety/restless 0 2 2 0 0  Suicidal thoughts 0 2 2 0 0   PHQ-9 Score 4 18 18 4  0  Difficult doing work/chores Somewhat difficult Very difficult  Not difficult at all Not difficult at all   GAD-7 Scores:     09/03/2022    2:41 PM 08/31/2022    2:25 PM 08/27/2022    2:52 PM 08/17/2022    9:10 AM  GAD 7 : Generalized Anxiety Score  Nervous, Anxious, on Edge 1 2 2 1   Control/stop worrying 1 2 2 1   Worry too much - different things 1 2 2  0  Trouble relaxing 0 2 2 0  Restless 0 2 2 1   Easily annoyed or irritable 1 3 3  0  Afraid - awful might happen 1 3 3 1   Total GAD 7 Score 5 16 16 4   Anxiety Difficulty Somewhat difficult Somewhat difficult Somewhat difficult Not difficult at all    Stress Current stressors:  Medical issues Sleep:  Improved  Appetite:  Normal Coping ability:  Fair Patient taking medications as prescribed:  Yes  Current medications:  Outpatient Encounter Medications as of 09/03/2022  Medication Sig   acetaminophen (TYLENOL) 500 MG tablet Take 500 mg by mouth as needed for moderate pain or mild pain (As Needed).   cimetidine (TAGAMET) 400 MG tablet TAKE 1 TABLET TWICE DAILY AS NEEDED   dicyclomine (BENTYL) 10 MG capsule TAKE 2 CAPSULES (20 MG) 15 MINUTES  BEFORE MEALS AS NEEDED   fluticasone (FLONASE) 50 MCG/ACT nasal spray USE 2 SPRAYS IN EACH NOSTRIL EVERY DAY   fluticasone furoate-vilanterol (BREO ELLIPTA) 200-25 MCG/ACT AEPB Inhale 1 puff into the lungs daily.   furosemide (LASIX) 20 MG tablet Take 1 tablet (20 mg total) by mouth daily as needed for edema or fluid (take in case of weight gain 2 to 3 lbs in 24 hrs or 5 lbs in 7 days.).   levETIRAcetam (KEPPRA XR) 500 MG 24 hr tablet Take 1 tablet (500 mg total) by mouth at bedtime.   losartan (COZAAR) 25 MG tablet Take 1 tablet (25 mg total) by mouth daily.   pantoprazole (PROTONIX) 40 MG tablet Take 1 tablet (40 mg total) by mouth daily. Office visit for further refills   sertraline (ZOLOFT) 50 MG tablet Take 1 tablet (50 mg total) by mouth daily.   simvastatin (ZOCOR) 20  MG tablet Take 1 tablet (20 mg total) by mouth daily at 6 PM.   spironolactone (ALDACTONE) 25 MG tablet Take 0.5 tablets (12.5 mg total) by mouth daily.   tamsulosin (FLOMAX) 0.4 MG CAPS capsule TAKE 1 CAPSULE (0.4 MG TOTAL) BY MOUTH DAILY.   traZODone (DESYREL) 50 MG tablet Take 0.5-1 tablets (25-50 mg total) by mouth at bedtime as needed for sleep.   No facility-administered encounter medications on file as of 09/03/2022.    Self-harm Behaviors Risk Assessment Self-harm risk factors:  Low Patient endorses recent thoughts of harming self:  Denies   Danger to Others Risk Assessment Danger to others risk factors:  Low Patient endorses recent thoughts of harming others:  Denies    Goals, Interventions and Follow-up Plan Goals:  Follow medication regimen, increase hobbies, decrease isolating, educate on acute stress disorder. Interventions: Mindfulness or Relaxation Training and CBT Cognitive Behavioral Therapy Follow-up Plan: Weekly follow ups    Reuel Boom

## 2022-09-10 ENCOUNTER — Encounter: Payer: Medicare HMO | Admitting: Professional Counselor

## 2022-09-10 NOTE — Progress Notes (Unsigned)
Cardiology Office Note:   Date:  09/11/2022  NAME:  Eric Cook    MRN: [id number] DOB:  01-28-1945   PCP:  Raliegh Ip, DO  Cardiologist:  Reatha Harps, MD  Electrophysiologist:  None   Referring MD: Arrie Senate*   Chief Complaint  Patient presents with   Shortness of Breath    History of Present Illness:   Eric Cook is a 77 y.o. male with a hx of COPD, HTN, HLD who is being seen today for the evaluation of SOB at the request of Arrie Senate*. Admitted to the hospital with SOB. BNP 98. Troponin 4->4.  He reports being evaluated at [facility] and [facility] in July and August.  He was evaluated for shortness of breath.  Imaging reviewed at [facility] and [facility].  At [facility] he had a CT scan.  Had no evidence of pulmonary edema.  He did have mild atelectasis and emphysema but no evidence of fluid in his lungs.  There was mention of being diuresed with quick improvement.  His BNP values were normal.  We discussed that congestive heart failure is unlikely.  I reviewed his echocardiogram.  This shows normal LV function and no evidence of significant diastolic dysfunction.  The pressure in his heart was normal.  Today on exam he has no evidence of volume overload.  He has been placed on Aldactone and does have some gynecomastia.  He needs to stop this.  We discussed that he does not have congestive heart failure based on my evaluation.  Seen at [facility].  BNP less than 50.  Again inconsistent with congestive heart failure.  It seems she has been quite worried about this.  I do understand that.  He does report exertional shortness of breath.  He has COPD.  He does have coronary calcium on his chest CT.  We did discuss that obstructive CAD is more likely.  His blood pressure is well-controlled.  He presents with his son.  He is a former smoker of 40+ years.  Reports no alcohol use.  He is not diabetic.  Kidney function is stable.  Most  recent LDL 87.  TSH within limits.  CV exam normal.  EKG shows sinus rhythm.  He was told he had stiffening of the heart.  EKG mention LVH at [facility].  EKG here does not show that.  Weights are stable.  Blood pressure at goal.  Stress test indicated.  He will see his pulmonologist as well.  Problem List COPD Seizures  HTN HLD -T chol 140, HDL 28, LDL 87, TG 137 Coronary calcifications    Past Medical History: Past Medical History:  Diagnosis Date   Anxiety    HLD (hyperlipidemia)    Hyperlipidemia    Hypertension    Peptic ulcer    Seizures (HCC)    medication related    Past Surgical History: Past Surgical History:  Procedure Laterality Date   LUMBAR DISC SURGERY     L3-L4   UPPER GASTROINTESTINAL ENDOSCOPY      Current Medications: Current Meds  Medication Sig   acetaminophen (TYLENOL) 500 MG tablet Take 500 mg by mouth as needed for moderate pain or mild pain (As Needed).   cimetidine (TAGAMET) 400 MG tablet TAKE 1 TABLET TWICE DAILY AS NEEDED   dicyclomine (BENTYL) 10 MG capsule TAKE 2 CAPSULES (20 MG) 15 MINUTES BEFORE MEALS AS NEEDED   fluticasone furoate-vilanterol (BREO ELLIPTA) 200-25 MCG/ACT  AEPB Inhale 1 puff into the lungs daily.   furosemide (LASIX) 20 MG tablet Take 1 tablet (20 mg total) by mouth daily as needed for edema or fluid (take in case of weight gain 2 to 3 lbs in 24 hrs or 5 lbs in 7 days.).   levETIRAcetam (KEPPRA XR) 500 MG 24 hr tablet Take 1 tablet (500 mg total) by mouth at bedtime.   pantoprazole (PROTONIX) 40 MG tablet Take 1 tablet (40 mg total) by mouth daily. Office visit for further refills   sertraline (ZOLOFT) 50 MG tablet Take 1 tablet (50 mg total) by mouth daily.   simvastatin (ZOCOR) 20 MG tablet Take 1 tablet (20 mg total) by mouth daily at 6 PM.   tamsulosin (FLOMAX) 0.4 MG CAPS capsule TAKE 1 CAPSULE (0.4 MG TOTAL) BY MOUTH DAILY.   traZODone (DESYREL) 50 MG tablet Take 0.5-1 tablets (25-50 mg total) by mouth at bedtime as needed  for sleep.   [DISCONTINUED] spironolactone (ALDACTONE) 25 MG tablet Take 0.5 tablets (12.5 mg total) by mouth daily.     Allergies:    Patient has no known allergies.   Social History: Social History   Socioeconomic History   Marital status: Single    Spouse name: Not on file   Number of children: 1   Years of education: Not on file   Highest education level: Not on file  Occupational History   Occupation: Lobbyist    Comment: part time  Tobacco Use   Smoking status: Former    Current packs/day: 0.00    Average packs/day: 1 pack/day for 54.0 years (54.0 ttl pk-yrs)    Types: Cigarettes    Start date: 04/1965    Quit date: 04/2019    Years since quitting: 3.3   Smokeless tobacco: Never  Vaping Use   Vaping status: Never Used  Substance and Sexual Activity   Alcohol use: Never   Drug use: Never   Sexual activity: Never  Other Topics Concern   Not on file  Social History Narrative   Lives alone   Right Handed   Drinks 2-3 cups of caffeine daily   Son in [place]   Social Determinants of Health   Financial Resource Strain: Low Risk  (06/22/2022)   Overall Financial Resource Strain (CARDIA)    Difficulty of Paying Living Expenses: Not hard at all  Food Insecurity: No Food Insecurity (08/03/2022)   Hunger Vital Sign    Worried About Running Out of Food in the Last Year: Never true    Ran Out of Food in the Last Year: Never true  Transportation Needs: No Transportation Needs (08/03/2022)   PRAPARE - Administrator, Civil Service (Medical): No    Lack of Transportation (Non-Medical): No  Physical Activity: Sufficiently Active (06/22/2022)   Exercise Vital Sign    Days of Exercise per Week: 7 days    Minutes of Exercise per Session: 30 min  Stress: No Stress Concern Present (06/22/2022)   Eric Cook of Occupational Health - Occupational Stress Questionnaire    Feeling of Stress : Not at all  Social Connections: Socially Integrated  (06/22/2022)   Social Connection and Isolation Panel [NHANES]    Frequency of Communication with Friends and Family: More than three times a week    Frequency of Social Gatherings with Friends and Family: More than three times a week    Attends Religious Services: More than 4 times per year    Active Member of  Clubs or Organizations: Yes    Attends Engineer, structural: More than 4 times per year    Marital Status: Married     Family History: The patient's family history includes Colon cancer in his brother; Kidney Stones in his son; Other in his son. There is no history of Esophageal cancer, Pancreatic cancer, or Stomach cancer.  ROS:   All other ROS reviewed and negative. Pertinent positives noted in the HPI.     EKGs/Labs/Other Studies Reviewed:   The following studies were personally reviewed by me today:  EKG:  EKG is not ordered today.   EKG dated 08/10/2022 showed sinus rhythm heart rate 96 with no acute ischemic changes.      TTE 08/04/2022  1. Left ventricular ejection fraction, by estimation, is 55 to 60%. The  left ventricle has normal function. The left ventricle has no regional  wall motion abnormalities. There is mild concentric left ventricular  hypertrophy. Left ventricular diastolic  parameters are consistent with Grade I diastolic dysfunction (impaired  relaxation).   2. Right ventricular systolic function is normal. The right ventricular  size is normal.   3. The mitral valve is normal in structure. Trivial mitral valve  regurgitation. No evidence of mitral stenosis.   4. The aortic valve is tricuspid. There is mild calcification of the  aortic valve. Aortic valve regurgitation is not visualized. No aortic  stenosis is present.   5. The inferior vena cava is normal in size with greater than 50%  respiratory variability, suggesting right atrial pressure of 3 mmHg.   Recent Labs: 03/16/2022: TSH 1.930 08/07/2022: ALT 29; BNP 6.7; Magnesium  2.1 08/09/2022: Hemoglobin 14.5; Platelets 399 09/03/2022: BUN 19; Creatinine, Ser 1.33; Potassium 4.4; Sodium 138   Recent Lipid Panel    Component Value Date/Time   CHOL 140 10/18/2021 1045   TRIG 137 10/18/2021 1045   HDL 28 (L) 10/18/2021 1045   CHOLHDL 5.0 10/18/2021 1045   LDLCALC 87 10/18/2021 1045    Physical Exam:   VS:  BP 110/66 (BP Location: Left Arm, Patient Position: Sitting, Cuff Size: Normal)   Pulse 78   Ht 5\' 9"  (1.753 m)   Wt 211 lb 6.4 oz (95.9 kg)   SpO2 91%   BMI 31.22 kg/m    Wt Readings from Last 3 Encounters:  09/11/22 211 lb 6.4 oz (95.9 kg)  08/31/22 206 lb (93.4 kg)  08/17/22 208 lb (94.3 kg)    General: Well nourished, well developed, in no acute distress Head: Atraumatic, normal size  Eyes: PEERLA, EOMI  Neck: Supple, no JVD Endocrine: No thryomegaly Cardiac: Normal S1, S2; RRR; no murmurs, rubs, or gallops Lungs: Clear to auscultation bilaterally, no wheezing, rhonchi or rales  Abd: Soft, nontender, no hepatomegaly  Ext: No edema, pulses 2+ Musculoskeletal: No deformities, BUE and BLE strength normal and equal Skin: Warm and dry, no rashes   Neuro: Alert and oriented to person, place, time, and situation, CNII-XII grossly intact, no focal deficits  Psych: Normal mood and affect   ASSESSMENT:   Eric Cook is a 77 y.o. male who presents for the following: 1. SOB (shortness of breath) on exertion   2. Coronary artery calcification seen on computed tomography   3. Mixed hyperlipidemia   4. Primary hypertension     PLAN:   1. SOB (shortness of breath) on exertion -Reports shortness of breath for the past few months.  Admitted to the hospital.  Chest CT showed no evidence of pulmonary  edema.  There was atelectasis seen.  BNP 97 and 7 on repeat.  Evaluated at [facility].  BNP less than 50.  He has no evidence of volume overload.  I was not in the hospital but he reports that his legs were swollen.  He improved quickly.  Really unclear if  he has congestive heart failure at this time.  He has no evidence of volume overload today.  He has had gynecomastia from Aldactone.  Would recommend he stop this.  He can take Lasix as needed and watch his salt consumption.  This could just simply be venous insufficiency.  He does get quite short winded with activity.  Very strong smoking history.  Symptoms are either related to COPD or possibly CAD.  Given his high coronary calcium seen on chest CT I want him to get a stress test.  Everything looks stable today.  Would not change any other medications.  May need to be on a different statin.  He will see me back in 6 months to reassess.  Reconcile  2. Coronary artery calcification seen on computed tomography 3. Mixed hyperlipidemia -Coronary calcium seen on chest CT.  Now shortness of breath.  Symptoms may represent angina.  Recommend a Lexiscan nuclear medicine stress test.  He will continue his statin for now.  4. Primary hypertension -Well-controlled on current medications.  Reevaluation in 6 months.  Informed Consent   Shared Decision Making/Informed Consent The risks [chest pain, shortness of breath, cardiac arrhythmias, dizziness, blood pressure fluctuations, myocardial infarction, stroke/transient ischemic attack, nausea, vomiting, allergic reaction, radiation exposure, metallic taste sensation and life-threatening complications (estimated to be 1 in 10,000)], benefits (risk stratification, diagnosing coronary artery disease, treatment guidance) and alternatives of a nuclear stress test were discussed in detail with Eric Cook and he agrees to proceed.     Disposition: Return in about 6 months (around 03/14/2023).  Medication Adjustments/Labs and Tests Ordered: Current medicines are reviewed at length with the patient today.  Concerns regarding medicines are outlined above.  Orders Placed This Encounter  Procedures   Cardiac Stress Test: Informed Consent Details: Physician/Practitioner  Attestation; Transcribe to consent form and obtain patient signature   MYOCARDIAL PERFUSION IMAGING   No orders of the defined types were placed in this encounter.  Patient Instructions  Medication Instructions:  STOP SPIRONOLACTONE *If you need a refill on your cardiac medications before your next appointment, please call your pharmacy*  Lab Work: NONE If you have labs (blood work) drawn today and your tests are completely normal, you will receive your results only by: MyChart Message (if you have MyChart) OR A paper copy in the mail If you have any lab test that is abnormal or we need to change your treatment, we will call you to review the results.   Testing/Procedures: Your physician has requested that you have a lexiscan myoview. For further information please visit [email or web address] Please follow instruction sheet, as given.  Follow-Up: At [facility], you and your health needs are our priority.  As part of our continuing mission to provide you with exceptional heart care, we have created designated Provider Care Teams.  These Care Teams include your primary Cardiologist (physician) and Advanced Practice Providers (APPs -  Physician Assistants and Nurse Practitioners) who all work together to provide you with the care you need, when you need it.  Your next appointment:   6 month(s)  Provider:   Reatha Harps, MD     Other Instructions  Signed, Lenna Gilford. Flora Lipps, MD, [facility]  [facility]  [address], [address] [place], [place] [address] [phone]  09/11/2022 3:22 PM

## 2022-09-11 ENCOUNTER — Encounter: Payer: Self-pay | Admitting: Cardiovascular Disease

## 2022-09-11 ENCOUNTER — Encounter: Payer: Self-pay | Admitting: Family Medicine

## 2022-09-11 ENCOUNTER — Ambulatory Visit: Payer: Medicare HMO | Attending: Cardiovascular Disease | Admitting: Cardiovascular Disease

## 2022-09-11 VITALS — BP 110/66 | HR 78 | Ht 69.0 in | Wt 211.4 lb

## 2022-09-11 DIAGNOSIS — I2510 Atherosclerotic heart disease of native coronary artery without angina pectoris: Secondary | ICD-10-CM

## 2022-09-11 DIAGNOSIS — R0602 Shortness of breath: Secondary | ICD-10-CM

## 2022-09-11 DIAGNOSIS — E782 Mixed hyperlipidemia: Secondary | ICD-10-CM | POA: Diagnosis not present

## 2022-09-11 DIAGNOSIS — I10 Essential (primary) hypertension: Secondary | ICD-10-CM

## 2022-09-11 NOTE — Patient Instructions (Addendum)
Medication Instructions:  STOP SPIRONOLACTONE *If you need a refill on your cardiac medications before your next appointment, please call your pharmacy*  Lab Work: NONE If you have labs (blood work) drawn today and your tests are completely normal, you will receive your results only by: MyChart Message (if you have MyChart) OR A paper copy in the mail If you have any lab test that is abnormal or we need to change your treatment, we will call you to review the results.   Testing/Procedures: Your physician has requested that you have a lexiscan myoview. For further information please visit [email or web address] Please follow instruction sheet, as given.  Follow-Up: At [facility], you and your health needs are our priority.  As part of our continuing mission to provide you with exceptional heart care, we have created designated Provider Care Teams.  These Care Teams include your primary Cardiologist (physician) and Advanced Practice Providers (APPs -  Physician Assistants and Nurse Practitioners) who all work together to provide you with the care you need, when you need it.  Your next appointment:   6 month(s)  Provider:   Reatha Harps, MD     Other Instructions

## 2022-09-13 DIAGNOSIS — I471 Supraventricular tachycardia, unspecified: Secondary | ICD-10-CM | POA: Diagnosis not present

## 2022-09-13 DIAGNOSIS — I472 Ventricular tachycardia, unspecified: Secondary | ICD-10-CM | POA: Diagnosis not present

## 2022-09-13 DIAGNOSIS — I491 Atrial premature depolarization: Secondary | ICD-10-CM | POA: Diagnosis not present

## 2022-09-13 DIAGNOSIS — I493 Ventricular premature depolarization: Secondary | ICD-10-CM | POA: Diagnosis not present

## 2022-09-13 DIAGNOSIS — R0602 Shortness of breath: Secondary | ICD-10-CM | POA: Diagnosis not present

## 2022-09-14 ENCOUNTER — Encounter: Payer: Self-pay | Admitting: Family Medicine

## 2022-09-14 ENCOUNTER — Ambulatory Visit (INDEPENDENT_AMBULATORY_CARE_PROVIDER_SITE_OTHER): Payer: Medicare HMO | Admitting: Family Medicine

## 2022-09-14 VITALS — BP 136/68 | HR 82 | Temp 98.4°F | Ht 69.0 in | Wt 206.0 lb

## 2022-09-14 DIAGNOSIS — R4589 Other symptoms and signs involving emotional state: Secondary | ICD-10-CM | POA: Diagnosis not present

## 2022-09-14 NOTE — Progress Notes (Unsigned)
Subjective: CC:*** PCP: Raliegh Ip, DO JYN:WGNFAO R Renzi is a 77 y.o. male presenting to clinic today for:  1. ***   ROS: Per HPI  No Known Allergies Past Medical History:  Diagnosis Date   Anxiety    HLD (hyperlipidemia)    Hyperlipidemia    Hypertension    Peptic ulcer    Seizures (HCC)    medication related    Current Outpatient Medications:    acetaminophen (TYLENOL) 500 MG tablet, Take 500 mg by mouth as needed for moderate pain or mild pain (As Needed)., Disp: , Rfl:    cimetidine (TAGAMET) 400 MG tablet, TAKE 1 TABLET TWICE DAILY AS NEEDED, Disp: 180 tablet, Rfl: 3   dicyclomine (BENTYL) 10 MG capsule, TAKE 2 CAPSULES (20 MG) 15 MINUTES BEFORE MEALS AS NEEDED, Disp: 90 capsule, Rfl: 3   fluticasone (FLONASE) 50 MCG/ACT nasal spray, USE 2 SPRAYS IN EACH NOSTRIL EVERY DAY, Disp: 48 each, Rfl: 3   fluticasone furoate-vilanterol (BREO ELLIPTA) 200-25 MCG/ACT AEPB, Inhale 1 puff into the lungs daily., Disp: 60 each, Rfl: 5   furosemide (LASIX) 20 MG tablet, Take 1 tablet (20 mg total) by mouth daily as needed for edema or fluid (take in case of weight gain 2 to 3 lbs in 24 hrs or 5 lbs in 7 days.)., Disp: 30 tablet, Rfl: 0   levETIRAcetam (KEPPRA XR) 500 MG 24 hr tablet, Take 1 tablet (500 mg total) by mouth at bedtime., Disp: 90 tablet, Rfl: 3   pantoprazole (PROTONIX) 40 MG tablet, Take 1 tablet (40 mg total) by mouth daily. Office visit for further refills, Disp: 90 tablet, Rfl: 0   sertraline (ZOLOFT) 50 MG tablet, Take 1 tablet (50 mg total) by mouth daily., Disp: 90 tablet, Rfl: 0   simvastatin (ZOCOR) 20 MG tablet, Take 1 tablet (20 mg total) by mouth daily at 6 PM., Disp: 90 tablet, Rfl: 3   tamsulosin (FLOMAX) 0.4 MG CAPS capsule, TAKE 1 CAPSULE (0.4 MG TOTAL) BY MOUTH DAILY., Disp: 90 capsule, Rfl: 0   traZODone (DESYREL) 50 MG tablet, Take 0.5-1 tablets (25-50 mg total) by mouth at bedtime as needed for sleep., Disp: 90 tablet, Rfl: 0   losartan (COZAAR)  25 MG tablet, Take 1 tablet (25 mg total) by mouth daily., Disp: 30 tablet, Rfl: 0 Social History   Socioeconomic History   Marital status: Single    Spouse name: Not on file   Number of children: 1   Years of education: Not on file   Highest education level: Not on file  Occupational History   Occupation: Lobbyist    Comment: part time  Tobacco Use   Smoking status: Former    Current packs/day: 0.00    Average packs/day: 1 pack/day for 54.0 years (54.0 ttl pk-yrs)    Types: Cigarettes    Start date: 04/1965    Quit date: 04/2019    Years since quitting: 3.3   Smokeless tobacco: Never  Vaping Use   Vaping status: Never Used  Substance and Sexual Activity   Alcohol use: Never   Drug use: Never   Sexual activity: Never  Other Topics Concern   Not on file  Social History Narrative   Lives alone   Right Handed   Drinks 2-3 cups of caffeine daily   Son in [place]   Social Determinants of Health   Financial Resource Strain: Low Risk  (06/22/2022)   Overall Financial Resource Strain (CARDIA)    Difficulty  of Paying Living Expenses: Not hard at all  Food Insecurity: No Food Insecurity (08/03/2022)   Hunger Vital Sign    Worried About Running Out of Food in the Last Year: Never true    Ran Out of Food in the Last Year: Never true  Transportation Needs: No Transportation Needs (08/03/2022)   PRAPARE - Administrator, Civil Service (Medical): No    Lack of Transportation (Non-Medical): No  Physical Activity: Sufficiently Active (06/22/2022)   Exercise Vital Sign    Days of Exercise per Week: 7 days    Minutes of Exercise per Session: 30 min  Stress: No Stress Concern Present (06/22/2022)   [employer] of Occupational Health - Occupational Stress Questionnaire    Feeling of Stress : Not at all  Social Connections: Socially Integrated (06/22/2022)   Social Connection and Isolation Panel [NHANES]    Frequency of Communication with Friends and  Family: More than three times a week    Frequency of Social Gatherings with Friends and Family: More than three times a week    Attends Religious Services: More than 4 times per year    Active Member of [employer] or Organizations: Yes    Attends Engineer, structural: More than 4 times per year    Marital Status: Married  Catering manager Violence: Not At Risk (08/03/2022)   Humiliation, Afraid, Rape, and Kick questionnaire    Fear of Current or Ex-Partner: No    Emotionally Abused: No    Physically Abused: No    Sexually Abused: No   Family History  Problem Relation Age of Onset   Colon cancer Brother    Other Son        vertigo   Kidney Stones Son    Esophageal cancer Neg Hx    Pancreatic cancer Neg Hx    Stomach cancer Neg Hx     Objective: Office vital signs reviewed. BP 136/68   Pulse 82   Temp 98.4 F (36.9 C)   Ht 5\' 9"  (1.753 m)   Wt 206 lb (93.4 kg)   SpO2 94%   BMI 30.42 kg/m   Physical Examination:  General: Awake, alert, *** nourished, No acute distress HEENT: Normal    Neck: No masses palpated. No lymphadenopathy    Ears: Tympanic membranes intact, normal light reflex, no erythema, no bulging    Eyes: PERRLA, extraocular membranes intact, sclera ***    Nose: nasal turbinates moist, *** nasal discharge    Throat: moist mucus membranes, no erythema, *** tonsillar exudate.  Airway is patent Cardio: regular rate and rhythm, S1S2 heard, no murmurs appreciated Pulm: clear to auscultation bilaterally, no wheezes, rhonchi or rales; normal work of breathing on room air GI: soft, non-tender, non-distended, bowel sounds present x4, no hepatomegaly, no splenomegaly, no masses GU: external vaginal tissue ***, cervix ***, *** punctate lesions on cervix appreciated, *** discharge from cervical os, *** bleeding, *** cervical motion tenderness, *** abdominal/ adnexal masses Extremities: warm, well perfused, No edema, cyanosis or clubbing; +*** pulses bilaterally MSK:  *** gait and *** station Skin: dry; intact; no rashes or lesions Neuro: *** Strength and light touch sensation grossly intact, *** DTRs ***/4  Assessment/ Plan: 77 y.o. male   No diagnosis found.  ***   Avy Barlett Hulen Skains, DO [facility] [phone]

## 2022-09-17 ENCOUNTER — Ambulatory Visit (INDEPENDENT_AMBULATORY_CARE_PROVIDER_SITE_OTHER): Payer: Medicare HMO | Admitting: Professional Counselor

## 2022-09-17 DIAGNOSIS — F411 Generalized anxiety disorder: Secondary | ICD-10-CM

## 2022-09-17 NOTE — BH Specialist Note (Signed)
[facility] Telephone Follow-up  MRN: [id number] NAME: Eric Cook Date: 09/17/22  Start time: Start Time: 1000 End time: Stop Time: 1030 Total time: Total Time in Minutes (Visit): 30 Call number: Visit Number: 4- Fourth Visit  Reason for call today:  The patient is a 77 year old male returning for a collaborative care follow-up conducted virtually. The behavioral counselor discussed the limitations of telehealth with the patient, who agreed to proceed with the session. During the session, the patient presented in a positive mood, displaying a bright affect, good eye contact, and active engagement.  The patient reports a continued improvement in his mood and a significant decrease in symptoms. He noted only one recent episode of feeling panicked, which occurred after a follow-up with his doctor. The patient explained that he felt anxious, fearing he might receive bad news, but was relieved to learn that his heart is healthy. His doctors plan to continue standard procedures to rule out any issues, but overall, he has received positive reports.  The patient also reports significant improvement in his sleep, now getting about six hours of rest, which he attributes to trazodone. Additionally, he mentioned that the Zoloft he started two weeks ago seems to be taking effect, contributing to his improved mood. He is feeling optimistic about resuming activities he enjoyed before his medical episode, such as mowing the lawn, gardening, and doing plumbing work.  His son remains a great support system, regularly checking in on him and helping around the house. Overall, the patient is doing well, showing progress, and maintaining stability. The behavioral counselor and collaborative care team plan to follow up in four weeks, with the possibility of signing off if his progress continues at this rate.   PHQ-9 Scores:     09/14/2022    3:07 PM 09/03/2022    2:40 PM 08/31/2022    2:17 PM 08/27/2022     2:51 PM 08/17/2022    9:10 AM  Depression screen PHQ 2/9  Decreased Interest 0 2 2 2  0  Down, Depressed, Hopeless 0 2 2 2  0  PHQ - 2 Score 0 4 4 4  0  Altered sleeping 0 0 2 2 1   Tired, decreased energy 0 0 2 2 2   Change in appetite 0 0 2 2 1   Feeling bad or failure about yourself  0 0 2 2 0  Trouble concentrating 0 0 2 2 0  Moving slowly or fidgety/restless 0 0 2 2 0  Suicidal thoughts 0 0 2 2 0  PHQ-9 Score 0 4 18 18 4   Difficult doing work/chores Not difficult at all Somewhat difficult Very difficult  Not difficult at all   GAD-7 Scores:     09/14/2022    3:04 PM 09/03/2022    2:41 PM 08/31/2022    2:25 PM 08/27/2022    2:52 PM  GAD 7 : Generalized Anxiety Score  Nervous, Anxious, on Edge 1 1 2 2   Control/stop worrying 1 1 2 2   Worry too much - different things 1 1 2 2   Trouble relaxing 1 0 2 2  Restless 1 0 2 2  Easily annoyed or irritable 1 1 3 3   Afraid - awful might happen 1 1 3 3   Total GAD 7 Score 7 5 16 16   Anxiety Difficulty Somewhat difficult Somewhat difficult Somewhat difficult Somewhat difficult    Stress Current stressors:  Health problems Sleep:  Improved  Appetite:  Improved Coping ability:  Fair Patient taking medications as prescribed:  Yes  Current medications:  Outpatient Encounter Medications as of 09/17/2022  Medication Sig   acetaminophen (TYLENOL) 500 MG tablet Take 500 mg by mouth as needed for moderate pain or mild pain (As Needed).   cimetidine (TAGAMET) 400 MG tablet TAKE 1 TABLET TWICE DAILY AS NEEDED   dicyclomine (BENTYL) 10 MG capsule TAKE 2 CAPSULES (20 MG) 15 MINUTES BEFORE MEALS AS NEEDED   fluticasone (FLONASE) 50 MCG/ACT nasal spray USE 2 SPRAYS IN EACH NOSTRIL EVERY DAY   fluticasone furoate-vilanterol (BREO ELLIPTA) 200-25 MCG/ACT AEPB Inhale 1 puff into the lungs daily.   furosemide (LASIX) 20 MG tablet Take 1 tablet (20 mg total) by mouth daily as needed for edema or fluid (take in case of weight gain 2 to 3 lbs in 24 hrs or 5 lbs in  7 days.).   levETIRAcetam (KEPPRA XR) 500 MG 24 hr tablet Take 1 tablet (500 mg total) by mouth at bedtime.   losartan (COZAAR) 25 MG tablet Take 1 tablet (25 mg total) by mouth daily.   pantoprazole (PROTONIX) 40 MG tablet Take 1 tablet (40 mg total) by mouth daily. Office visit for further refills   sertraline (ZOLOFT) 50 MG tablet Take 1 tablet (50 mg total) by mouth daily.   simvastatin (ZOCOR) 20 MG tablet Take 1 tablet (20 mg total) by mouth daily at 6 PM.   tamsulosin (FLOMAX) 0.4 MG CAPS capsule TAKE 1 CAPSULE (0.4 MG TOTAL) BY MOUTH DAILY.   traZODone (DESYREL) 50 MG tablet Take 0.5-1 tablets (25-50 mg total) by mouth at bedtime as needed for sleep.   No facility-administered encounter medications on file as of 09/17/2022.     Self-harm Behaviors Risk Assessment Self-harm risk factors:  Low Patient endorses recent thoughts of harming self:  Denies   Danger to Others Risk Assessment Danger to others risk factors:  None Patient endorses recent thoughts of harming others:  Denies   Goals, Interventions and Follow-up Plan Goals: Maintain current mood. Feeling positive Interventions: Mindfulness or Relaxation Training and CBT Cognitive Behavioral Therapy Follow-up Plan:  Follow up in 4 weeks   Reuel Boom

## 2022-09-21 ENCOUNTER — Telehealth (HOSPITAL_COMMUNITY): Payer: Self-pay | Admitting: *Deleted

## 2022-09-21 ENCOUNTER — Telehealth: Payer: Self-pay | Admitting: Cardiovascular Disease

## 2022-09-21 NOTE — Telephone Encounter (Signed)
Patient returned staff call. 

## 2022-09-21 NOTE — Telephone Encounter (Signed)
Left message on voicemail per DPR in reference to upcoming appointment scheduled on 09/26/2022 at 10:30 with detailed instructions given per Myocardial Perfusion Study Information Sheet for the test. LM to arrive 15 minutes early, and that it is imperative to arrive on time for appointment to keep from having the test rescheduled. If you need to cancel or reschedule your appointment, please call the office within 24 hours of your appointment. Failure to do so may result in a cancellation of your appointment, and a $50 no show fee. Phone number given for call back for any questions.

## 2022-09-25 NOTE — Telephone Encounter (Signed)
Patient and/or legal guardian verbally consented to [facility] services about presenting concerns and psychiatric consultation as appropriate.  The services will be billed as appropriate for the patient.

## 2022-09-26 ENCOUNTER — Ambulatory Visit (HOSPITAL_COMMUNITY): Payer: Medicare HMO | Attending: Cardiovascular Disease

## 2022-09-26 DIAGNOSIS — R0602 Shortness of breath: Secondary | ICD-10-CM | POA: Diagnosis not present

## 2022-09-26 LAB — MYOCARDIAL PERFUSION IMAGING
LV dias vol: 79 mL (ref 62–150)
LV sys vol: 27 mL
Nuc Stress EF: 66 %
Peak HR: 84 {beats}/min
Rest HR: 64 {beats}/min
Rest Nuclear Isotope Dose: 10.7 mCi
SDS: 2
SRS: 0
SSS: 2
ST Depression (mm): 0 mm
Stress Nuclear Isotope Dose: 32.1 mCi
TID: 0.99

## 2022-09-26 MED ORDER — TECHNETIUM TC 99M TETROFOSMIN IV KIT
10.7000 | PACK | Freq: Once | INTRAVENOUS | Status: AC | PRN
  Administered 2022-09-26: 10.7 via INTRAVENOUS

## 2022-09-26 MED ORDER — REGADENOSON 0.4 MG/5ML IV SOLN
0.4000 mg | Freq: Once | INTRAVENOUS | Status: AC
  Administered 2022-09-26: 0.4 mg via INTRAVENOUS

## 2022-09-26 MED ORDER — TECHNETIUM TC 99M TETROFOSMIN IV KIT
32.1000 | PACK | Freq: Once | INTRAVENOUS | Status: AC | PRN
  Administered 2022-09-26: 32.1 via INTRAVENOUS

## 2022-09-30 ENCOUNTER — Other Ambulatory Visit: Payer: Self-pay | Admitting: Family Medicine

## 2022-10-01 ENCOUNTER — Other Ambulatory Visit: Payer: Self-pay | Admitting: Internal Medicine

## 2022-10-01 DIAGNOSIS — R1310 Dysphagia, unspecified: Secondary | ICD-10-CM

## 2022-10-01 DIAGNOSIS — K222 Esophageal obstruction: Secondary | ICD-10-CM

## 2022-10-01 DIAGNOSIS — K219 Gastro-esophageal reflux disease without esophagitis: Secondary | ICD-10-CM

## 2022-10-01 DIAGNOSIS — K209 Esophagitis, unspecified without bleeding: Secondary | ICD-10-CM

## 2022-10-01 NOTE — Telephone Encounter (Signed)
Pt needs this refill sent to [facility]. Not [facility].

## 2022-10-02 ENCOUNTER — Encounter: Payer: Self-pay | Admitting: Family Medicine

## 2022-10-02 ENCOUNTER — Ambulatory Visit (INDEPENDENT_AMBULATORY_CARE_PROVIDER_SITE_OTHER): Payer: Medicare HMO | Admitting: Family Medicine

## 2022-10-02 VITALS — BP 134/69 | HR 75 | Temp 98.5°F | Ht 69.0 in | Wt 211.0 lb

## 2022-10-02 DIAGNOSIS — H6992 Unspecified Eustachian tube disorder, left ear: Secondary | ICD-10-CM

## 2022-10-02 DIAGNOSIS — H60502 Unspecified acute noninfective otitis externa, left ear: Secondary | ICD-10-CM

## 2022-10-02 DIAGNOSIS — F411 Generalized anxiety disorder: Secondary | ICD-10-CM

## 2022-10-02 MED ORDER — CETIRIZINE HCL 10 MG PO TABS
5.0000 mg | ORAL_TABLET | Freq: Every day | ORAL | 5 refills | Status: DC
Start: 2022-10-02 — End: 2023-10-06

## 2022-10-02 MED ORDER — OFLOXACIN 0.3 % OT SOLN: 10.0000 [drp] | Freq: Every day | OTIC | 0 refills | Status: AC

## 2022-10-02 NOTE — Progress Notes (Addendum)
Subjective:  Patient ID: Eric Cook, male    DOB: 1945-02-20, 77 y.o.   MRN: [id number]  Patient Care Team: Raliegh Ip, DO as PCP - General (Family Medicine) O'Neal, Ronnald Ramp, MD as PCP - Cardiology (Cardiology) York Spaniel, MD (Inactive) as Consulting Physician (Neurology) Crist Fat, MD as Attending Physician (Urology) Hilarie Fredrickson, MD as Consulting Physician (Gastroenterology) Ihor Austin, NP as Nurse Practitioner (Neurology) Hunsucker, Lesia Sago, MD as Consulting Physician (Pulmonary Disease) Reuel Boom as Counselor (Professional Counselor)   Chief Complaint:  No chief complaint on file.  HPI: Eric Cook is a 77 y.o. male presenting on 10/02/2022 for No chief complaint on file.  HPI States that he has had left ear pain and ringing for 4 days. Started on Friday. States that it feels stopped up. Today has congestion and soreness in his left nostril. Pain to touch at his ear. Reports that he is overall better, but still going. Denies subjective fever. Did not check his temperature at home. Endorses increased cough. Poor historian, patient not able to clarify symptoms. Presented with note of his symptoms. Endorses popping and feelings of "water" in his ears.   Relevant past medical, surgical, family, and social history reviewed and updated as indicated.  Allergies and medications reviewed and updated. Data reviewed: Chart in Epic.   Past Medical History:  Diagnosis Date   Anxiety    HLD (hyperlipidemia)    Hyperlipidemia    Hypertension    Peptic ulcer    Seizures (HCC)    medication related    Past Surgical History:  Procedure Laterality Date   LUMBAR DISC SURGERY     L3-L4   UPPER GASTROINTESTINAL ENDOSCOPY      Social History   Socioeconomic History   Marital status: Single    Spouse name: Not on file   Number of children: 1   Years of education: Not on file   Highest education level: Not on file  Occupational  History   Occupation: Lobbyist    Comment: part time  Tobacco Use   Smoking status: Former    Current packs/day: 0.00    Average packs/day: 1 pack/day for 54.0 years (54.0 ttl pk-yrs)    Types: Cigarettes    Start date: 04/1965    Quit date: 04/2019    Years since quitting: 3.4   Smokeless tobacco: Never  Vaping Use   Vaping status: Never Used  Substance and Sexual Activity   Alcohol use: Never   Drug use: Never   Sexual activity: Never  Other Topics Concern   Not on file  Social History Narrative   Lives alone   Right Handed   Drinks 2-3 cups of caffeine daily   Son in [place]   Social Determinants of Health   Financial Resource Strain: Low Risk  (06/22/2022)   Overall Financial Resource Strain (CARDIA)    Difficulty of Paying Living Expenses: Not hard at all  Food Insecurity: No Food Insecurity (08/03/2022)   Hunger Vital Sign    Worried About Running Out of Food in the Last Year: Never true    Ran Out of Food in the Last Year: Never true  Transportation Needs: No Transportation Needs (08/03/2022)   PRAPARE - Administrator, Civil Service (Medical): No    Lack of Transportation (Non-Medical): No  Physical Activity: Sufficiently Active (06/22/2022)   Exercise Vital Sign    Days of Exercise per Week: 7  days    Minutes of Exercise per Session: 30 min  Stress: No Stress Concern Present (06/22/2022)   [employer] of Occupational Health - Occupational Stress Questionnaire    Feeling of Stress : Not at all  Social Connections: Socially Integrated (06/22/2022)   Social Connection and Isolation Panel [NHANES]    Frequency of Communication with Friends and Family: More than three times a week    Frequency of Social Gatherings with Friends and Family: More than three times a week    Attends Religious Services: More than 4 times per year    Active Member of [employer] or Organizations: Yes    Attends Engineer, structural: More than 4 times  per year    Marital Status: Married  Catering manager Violence: Not At Risk (08/03/2022)   Humiliation, Afraid, Rape, and Kick questionnaire    Fear of Current or Ex-Partner: No    Emotionally Abused: No    Physically Abused: No    Sexually Abused: No    Outpatient Encounter Medications as of 10/02/2022  Medication Sig   acetaminophen (TYLENOL) 500 MG tablet Take 500 mg by mouth as needed for moderate pain or mild pain (As Needed).   cimetidine (TAGAMET) 400 MG tablet TAKE 1 TABLET TWICE DAILY AS NEEDED   dicyclomine (BENTYL) 10 MG capsule TAKE 2 CAPSULES (20 MG) 15 MINUTES BEFORE MEALS AS NEEDED; office visit for further refills   fluticasone (FLONASE) 50 MCG/ACT nasal spray USE 2 SPRAYS IN EACH NOSTRIL EVERY DAY   fluticasone furoate-vilanterol (BREO ELLIPTA) 200-25 MCG/ACT AEPB Inhale 1 puff into the lungs daily.   furosemide (LASIX) 20 MG tablet Take 1 tablet (20 mg total) by mouth daily as needed for edema or fluid (take in case of weight gain 2 to 3 lbs in 24 hrs or 5 lbs in 7 days.).   levETIRAcetam (KEPPRA XR) 500 MG 24 hr tablet Take 1 tablet (500 mg total) by mouth at bedtime.   losartan (COZAAR) 25 MG tablet TAKE 1 TABLET (25 MG TOTAL) BY MOUTH DAILY.   pantoprazole (PROTONIX) 40 MG tablet Take 1 tablet (40 mg total) by mouth daily. Office visit for further refills   sertraline (ZOLOFT) 50 MG tablet Take 1 tablet (50 mg total) by mouth daily.   simvastatin (ZOCOR) 20 MG tablet Take 1 tablet (20 mg total) by mouth daily at 6 PM.   tamsulosin (FLOMAX) 0.4 MG CAPS capsule TAKE 1 CAPSULE (0.4 MG TOTAL) BY MOUTH DAILY.   traZODone (DESYREL) 50 MG tablet Take 0.5-1 tablets (25-50 mg total) by mouth at bedtime as needed for sleep.   No facility-administered encounter medications on file as of 10/02/2022.    No Known Allergies  Review of Systems As per HPI  Objective:  BP 134/69   Pulse 75   Temp 98.5 F (36.9 C)   Ht 5\' 9"  (1.753 m)   Wt 211 lb (95.7 kg)   SpO2 96%   BMI  31.16 kg/m    Wt Readings from Last 3 Encounters:  10/02/22 211 lb (95.7 kg)  09/14/22 206 lb (93.4 kg)  09/11/22 211 lb 6.4 oz (95.9 kg)    Physical Exam Constitutional:      General: He is awake. He is not in acute distress.    Appearance: Normal appearance. He is well-developed and well-groomed. He is not ill-appearing, toxic-appearing or diaphoretic.  HENT:     Right Ear: No decreased hearing noted. No laceration, drainage, swelling or tenderness. No middle ear effusion.  There is no impacted cerumen. No foreign body. No mastoid tenderness. No PE tube. No hemotympanum. Tympanic membrane is not injected, scarred, perforated, erythematous or retracted.     Left Ear: A middle ear effusion is present.     Ears:     Comments: External ear tender to palpation. Erythematous canal     Nose: Congestion and rhinorrhea present. Rhinorrhea is clear.     Right Sinus: No maxillary sinus tenderness or frontal sinus tenderness.     Left Sinus: No maxillary sinus tenderness or frontal sinus tenderness.     Mouth/Throat:     Lips: Pink. No lesions.  Cardiovascular:     Rate and Rhythm: Normal rate and regular rhythm.     Pulses: Normal pulses.          Radial pulses are 2+ on the right side and 2+ on the left side.       Posterior tibial pulses are 2+ on the right side and 2+ on the left side.     Heart sounds: Normal heart sounds. No murmur heard.    No gallop.  Pulmonary:     Effort: Pulmonary effort is normal. No respiratory distress.     Breath sounds: Normal breath sounds. No stridor. No wheezing, rhonchi or rales.  Musculoskeletal:     Cervical back: Full passive range of motion without pain and neck supple.     Right lower leg: No edema.     Left lower leg: No edema.  Lymphadenopathy:     Head:     Right side of head: No submental, submandibular, tonsillar, preauricular or posterior auricular adenopathy.     Left side of head: No submental, submandibular, tonsillar, preauricular or  posterior auricular adenopathy.  Skin:    General: Skin is warm.     Capillary Refill: Capillary refill takes less than 2 seconds.  Neurological:     General: No focal deficit present.     Mental Status: He is alert, oriented to person, place, and time and easily aroused. Mental status is at baseline.     GCS: GCS eye subscore is 4. GCS verbal subscore is 5. GCS motor subscore is 6.     Motor: No weakness.  Psychiatric:        Attention and Perception: Attention and perception normal.        Mood and Affect: Mood and affect normal.        Speech: Speech normal.        Behavior: Behavior normal. Behavior is cooperative.        Thought Content: Thought content normal. Thought content does not include homicidal or suicidal ideation. Thought content does not include homicidal or suicidal plan.        Cognition and Memory: He exhibits impaired recent memory.        Judgment: Judgment normal.      Results for orders placed or performed in visit on 09/26/22  MYOCARDIAL PERFUSION IMAGING  Result Value Ref Range   Rest Nuclear Isotope Dose 10.7 mCi   Stress Nuclear Isotope Dose 32.1 mCi   Rest HR 64.0 bpm   Rest BP 155/82 mmHg   Peak HR 84 bpm   Peak BP 160/73 mmHg   SSS 2.0    SRS 0.0    SDS 2.0    TID 0.99    LV sys vol 27.0 mL   LV dias vol 79.0 62 - 150 mL   Nuc Stress EF 66 %   ST  Depression (mm) 0 mm       10/02/2022    1:29 PM 09/14/2022    3:07 PM 09/03/2022    2:40 PM 08/31/2022    2:17 PM 08/27/2022    2:51 PM  Depression screen PHQ 2/9  Decreased Interest 0 0 2 2 2   Down, Depressed, Hopeless 0 0 2 2 2   PHQ - 2 Score 0 0 4 4 4   Altered sleeping 2 0 0 2 2  Tired, decreased energy 0 0 0 2 2  Change in appetite 0 0 0 2 2  Feeling bad or failure about yourself  0 0 0 2 2  Trouble concentrating 0 0 0 2 2  Moving slowly or fidgety/restless 0 0 0 2 2  Suicidal thoughts 0 0 0 2 2  PHQ-9 Score 2 0 4 18 18   Difficult doing work/chores Not difficult at all Not difficult at  all Somewhat difficult Very difficult        10/02/2022    1:38 PM 09/14/2022    3:04 PM 09/03/2022    2:41 PM 08/31/2022    2:25 PM  GAD 7 : Generalized Anxiety Score  Nervous, Anxious, on Edge 1 1 1 2   Control/stop worrying 1 1 1 2   Worry too much - different things 1 1 1 2   Trouble relaxing 1 1 0 2  Restless 1 1 0 2  Easily annoyed or irritable 1 1 1 3   Afraid - awful might happen 1 1 1 3   Total GAD 7 Score 7 7 5 16   Anxiety Difficulty Somewhat difficult Somewhat difficult Somewhat difficult Somewhat difficult   Pertinent labs & imaging results that were available during my care of the patient were reviewed by me and considered in my medical decision making.  Assessment & Plan:  Diagnoses and all orders for this visit:  Acute otitis externa of left ear, unspecified type Medication as below.  -     ofloxacin (FLOXIN) 0.3 % OTIC solution; Place 10 drops into the left ear daily for 7 days.  Eustachian tube dysfunction, left -     cetirizine (ZYRTEC) 10 MG tablet; Take 0.5 tablets (5 mg total) by mouth daily.  GAD (generalized anxiety disorder) Stable. Patient to follow up with PCP.    Continue all other maintenance medications.  Follow up plan: Return if symptoms worsen or fail to improve.  Continue healthy lifestyle choices, including diet (rich in fruits, vegetables, and lean proteins, and low in salt and simple carbohydrates) and exercise (at least 30 minutes of moderate physical activity daily).  Written and verbal instructions provided   The above assessment and management plan was discussed with the patient. The patient verbalized understanding of and has agreed to the management plan. Patient is aware to call the clinic if they develop any new symptoms or if symptoms persist or worsen. Patient is aware when to return to the clinic for a follow-up visit. Patient educated on when it is appropriate to go to the emergency department.   Neale Burly, DNP-FNP [facility] [address] [place], [place] [address] [phone]

## 2022-10-03 DIAGNOSIS — M47816 Spondylosis without myelopathy or radiculopathy, lumbar region: Secondary | ICD-10-CM | POA: Diagnosis not present

## 2022-10-03 DIAGNOSIS — M533 Sacrococcygeal disorders, not elsewhere classified: Secondary | ICD-10-CM | POA: Diagnosis not present

## 2022-10-10 ENCOUNTER — Ambulatory Visit (INDEPENDENT_AMBULATORY_CARE_PROVIDER_SITE_OTHER): Payer: Medicare HMO | Admitting: Family Medicine

## 2022-10-10 ENCOUNTER — Encounter: Payer: Self-pay | Admitting: Family Medicine

## 2022-10-10 VITALS — BP 150/73 | HR 76 | Temp 98.5°F | Ht 69.0 in | Wt 213.0 lb

## 2022-10-10 DIAGNOSIS — H60502 Unspecified acute noninfective otitis externa, left ear: Secondary | ICD-10-CM | POA: Diagnosis not present

## 2022-10-10 DIAGNOSIS — M533 Sacrococcygeal disorders, not elsewhere classified: Secondary | ICD-10-CM | POA: Diagnosis not present

## 2022-10-10 DIAGNOSIS — R944 Abnormal results of kidney function studies: Secondary | ICD-10-CM | POA: Diagnosis not present

## 2022-10-10 DIAGNOSIS — I10 Essential (primary) hypertension: Secondary | ICD-10-CM | POA: Diagnosis not present

## 2022-10-10 DIAGNOSIS — H9312 Tinnitus, left ear: Secondary | ICD-10-CM | POA: Diagnosis not present

## 2022-10-10 LAB — CMP14+EGFR
ALT: 31 IU/L (ref 0–44)
AST: 37 IU/L (ref 0–40)
Albumin: 4.5 g/dL (ref 3.8–4.8)
Alkaline Phosphatase: 83 IU/L (ref 44–121)
BUN/Creatinine Ratio: 13 (ref 10–24)
BUN: 15 mg/dL (ref 8–27)
Bilirubin Total: 0.6 mg/dL (ref 0.0–1.2)
CO2: 24 mmol/L (ref 20–29)
Calcium: 9.8 mg/dL (ref 8.6–10.2)
Chloride: 103 mmol/L (ref 96–106)
Creatinine, Ser: 1.16 mg/dL (ref 0.76–1.27)
Globulin, Total: 2.2 g/dL (ref 1.5–4.5)
Glucose: 99 mg/dL (ref 70–99)
Potassium: 4 mmol/L (ref 3.5–5.2)
Sodium: 142 mmol/L (ref 134–144)
Total Protein: 6.7 g/dL (ref 6.0–8.5)
eGFR: 65 mL/min/{1.73_m2} (ref >59)

## 2022-10-10 MED ORDER — HYDROCHLOROTHIAZIDE 25 MG PO TABS
25.0000 mg | ORAL_TABLET | Freq: Every day | ORAL | 0 refills | Status: AC
Start: 2022-10-10 — End: ?

## 2022-10-10 MED ORDER — AMOXICILLIN 875 MG PO TABS
875.0000 mg | ORAL_TABLET | Freq: Two times a day (BID) | ORAL | 0 refills | Status: AC
Start: 2022-10-10 — End: 2022-10-17

## 2022-10-10 NOTE — Patient Instructions (Addendum)
Stop Lasix   Monitoring your BP at home   Your BP was elevated today in office  Please keep a log of your BP at home.  The best time to take BP is 1st thing in the morning after waking.  Sit for 5 minutes with feet flat on the floor, arm at heart level.  Options for BP cuffs are at [employer], [employer], [facility], [facility] & [facility].  We will review measurements at follow up and determine plan for BP management.  If you have access, you can send a message in MyChart with your measurements prior to your follow up appointment.  The brand I recommend to get is Omron (Bronze).

## 2022-10-10 NOTE — Progress Notes (Signed)
Subjective:  Patient ID: Eric Cook, male    DOB: 06-21-1945, 77 y.o.   MRN: [id number]  Patient Care Team: Raliegh Ip, DO as PCP - General (Family Medicine) O'Neal, Ronnald Ramp, MD as PCP - Cardiology (Cardiology) York Spaniel, MD (Inactive) as Consulting Physician (Neurology) Crist Fat, MD as Attending Physician (Urology) Hilarie Fredrickson, MD as Consulting Physician (Gastroenterology) Ihor Austin, NP as Nurse Practitioner (Neurology) Hunsucker, Lesia Sago, MD as Consulting Physician (Pulmonary Disease) Reuel Boom as Counselor (Professional Counselor)   Chief Complaint:  Tinnitus (Left ear)  HPI: Eric Cook is a 77 y.o. male presenting on 10/10/2022 for Tinnitus (Left ear)  HPI Endorses constant ringing or humming in his ear. He has tried ofloxacing  Denies URI symptoms. Denies fever.  States that he takes lasix every once in a while. Has not taken it in the last 3 days. Denies fever. Has pressure and pain in ear constantly.  HTN  He states that he took his medication this morning   Relevant past medical, surgical, family, and social history reviewed and updated as indicated.  Allergies and medications reviewed and updated. Data reviewed: Chart in Epic.   Past Medical History:  Diagnosis Date   Anxiety    HLD (hyperlipidemia)    Hyperlipidemia    Hypertension    Peptic ulcer    Seizures (HCC)    medication related    Past Surgical History:  Procedure Laterality Date   LUMBAR DISC SURGERY     L3-L4   UPPER GASTROINTESTINAL ENDOSCOPY      Social History   Socioeconomic History   Marital status: Single    Spouse name: Not on file   Number of children: 1   Years of education: Not on file   Highest education level: Not on file  Occupational History   Occupation: Lobbyist    Comment: part time  Tobacco Use   Smoking status: Former    Current packs/day: 0.00    Average packs/day: 1 pack/day for 54.0  years (54.0 ttl pk-yrs)    Types: Cigarettes    Start date: 04/1965    Quit date: 04/2019    Years since quitting: 3.4   Smokeless tobacco: Never  Vaping Use   Vaping status: Never Used  Substance and Sexual Activity   Alcohol use: Never   Drug use: Never   Sexual activity: Never  Other Topics Concern   Not on file  Social History Narrative   Lives alone   Right Handed   Drinks 2-3 cups of caffeine daily   Son in [place]   Social Determinants of Health   Financial Resource Strain: Low Risk  (06/22/2022)   Overall Financial Resource Strain (CARDIA)    Difficulty of Paying Living Expenses: Not hard at all  Food Insecurity: No Food Insecurity (08/03/2022)   Hunger Vital Sign    Worried About Running Out of Food in the Last Year: Never true    Ran Out of Food in the Last Year: Never true  Transportation Needs: No Transportation Needs (08/03/2022)   PRAPARE - Administrator, Civil Service (Medical): No    Lack of Transportation (Non-Medical): No  Physical Activity: Sufficiently Active (06/22/2022)   Exercise Vital Sign    Days of Exercise per Week: 7 days    Minutes of Exercise per Session: 30 min  Stress: No Stress Concern Present (06/22/2022)   [employer] of Occupational Health - Occupational  Stress Questionnaire    Feeling of Stress : Not at all  Social Connections: Socially Integrated (06/22/2022)   Social Connection and Isolation Panel [NHANES]    Frequency of Communication with Friends and Family: More than three times a week    Frequency of Social Gatherings with Friends and Family: More than three times a week    Attends Religious Services: More than 4 times per year    Active Member of [employer] or Organizations: Yes    Attends Engineer, structural: More than 4 times per year    Marital Status: Married  Catering manager Violence: Not At Risk (08/03/2022)   Humiliation, Afraid, Rape, and Kick questionnaire    Fear of Current or Ex-Partner: No     Emotionally Abused: No    Physically Abused: No    Sexually Abused: No    Outpatient Encounter Medications as of 10/10/2022  Medication Sig   acetaminophen (TYLENOL) 500 MG tablet Take 500 mg by mouth as needed for moderate pain or mild pain (As Needed).   cetirizine (ZYRTEC) 10 MG tablet Take 0.5 tablets (5 mg total) by mouth daily.   cimetidine (TAGAMET) 400 MG tablet TAKE 1 TABLET TWICE DAILY AS NEEDED   dicyclomine (BENTYL) 10 MG capsule TAKE 2 CAPSULES (20 MG) 15 MINUTES BEFORE MEALS AS NEEDED; office visit for further refills   fluticasone (FLONASE) 50 MCG/ACT nasal spray USE 2 SPRAYS IN EACH NOSTRIL EVERY DAY   fluticasone furoate-vilanterol (BREO ELLIPTA) 200-25 MCG/ACT AEPB Inhale 1 puff into the lungs daily.   furosemide (LASIX) 20 MG tablet Take 1 tablet (20 mg total) by mouth daily as needed for edema or fluid (take in case of weight gain 2 to 3 lbs in 24 hrs or 5 lbs in 7 days.).   levETIRAcetam (KEPPRA XR) 500 MG 24 hr tablet Take 1 tablet (500 mg total) by mouth at bedtime.   losartan (COZAAR) 25 MG tablet TAKE 1 TABLET (25 MG TOTAL) BY MOUTH DAILY.   pantoprazole (PROTONIX) 40 MG tablet Take 1 tablet (40 mg total) by mouth daily. Office visit for further refills   sertraline (ZOLOFT) 50 MG tablet Take 1 tablet (50 mg total) by mouth daily.   simvastatin (ZOCOR) 20 MG tablet Take 1 tablet (20 mg total) by mouth daily at 6 PM.   tamsulosin (FLOMAX) 0.4 MG CAPS capsule TAKE 1 CAPSULE (0.4 MG TOTAL) BY MOUTH DAILY.   traZODone (DESYREL) 50 MG tablet Take 0.5-1 tablets (25-50 mg total) by mouth at bedtime as needed for sleep.   No facility-administered encounter medications on file as of 10/10/2022.    No Known Allergies  Review of Systems As per HPI  Objective:  BP (!) 149/77   Pulse 76   Temp 98.5 F (36.9 C)   Ht 5\' 9"  (1.753 m)   Wt 213 lb (96.6 kg)   SpO2 97%   BMI 31.45 kg/m    Wt Readings from Last 3 Encounters:  10/10/22 213 lb (96.6 kg)  10/02/22 211 lb  (95.7 kg)  09/14/22 206 lb (93.4 kg)   Physical Exam Constitutional:      General: He is awake. He is not in acute distress.    Appearance: Normal appearance. He is well-developed and well-groomed. He is obese. He is not ill-appearing, toxic-appearing or diaphoretic.  HENT:     Right Ear: No decreased hearing noted. No laceration, drainage, swelling or tenderness. No middle ear effusion. There is no impacted cerumen. No foreign body. No mastoid  tenderness. No PE tube. No hemotympanum. Tympanic membrane is not injected, scarred, perforated, erythematous, retracted or bulging.     Left Ear: No decreased hearing noted. Tenderness present. A middle ear effusion is present. Tympanic membrane is injected.     Ears:     Comments: Very tender to palpation in the canal    Nose:     Right Sinus: No maxillary sinus tenderness or frontal sinus tenderness.     Left Sinus: No maxillary sinus tenderness or frontal sinus tenderness.     Mouth/Throat:     Lips: Pink.     Mouth: Mucous membranes are moist.     Tongue: No lesions.     Tonsils: No tonsillar exudate or tonsillar abscesses.  Neck:     Thyroid: No thyroid mass.  Cardiovascular:     Rate and Rhythm: Normal rate and regular rhythm.     Pulses: Normal pulses.          Radial pulses are 2+ on the right side and 2+ on the left side.       Posterior tibial pulses are 2+ on the right side and 2+ on the left side.     Heart sounds: Normal heart sounds. No murmur heard.    No gallop.  Pulmonary:     Effort: Pulmonary effort is normal. No respiratory distress.     Breath sounds: Normal breath sounds. No stridor. No wheezing, rhonchi or rales.  Musculoskeletal:     Cervical back: Full passive range of motion without pain and neck supple.     Right lower leg: No edema.     Left lower leg: No edema.  Lymphadenopathy:     Head:     Right side of head: No submental, submandibular, tonsillar, preauricular or posterior auricular adenopathy.     Left  side of head: No submental, submandibular, tonsillar, preauricular or posterior auricular adenopathy.  Skin:    General: Skin is warm.     Capillary Refill: Capillary refill takes less than 2 seconds.  Neurological:     General: No focal deficit present.     Mental Status: He is alert, oriented to person, place, and time and easily aroused. Mental status is at baseline.     GCS: GCS eye subscore is 4. GCS verbal subscore is 5. GCS motor subscore is 6.     Motor: No weakness.  Psychiatric:        Attention and Perception: Attention and perception normal.        Mood and Affect: Mood and affect normal.        Speech: Speech normal.        Behavior: Behavior normal. Behavior is cooperative.        Thought Content: Thought content normal. Thought content does not include homicidal or suicidal ideation. Thought content does not include homicidal or suicidal plan.        Cognition and Memory: Cognition and memory normal.        Judgment: Judgment normal.     Results for orders placed or performed in visit on 09/26/22  MYOCARDIAL PERFUSION IMAGING  Result Value Ref Range   Rest Nuclear Isotope Dose 10.7 mCi   Stress Nuclear Isotope Dose 32.1 mCi   Rest HR 64.0 bpm   Rest BP 155/82 mmHg   Peak HR 84 bpm   Peak BP 160/73 mmHg   SSS 2.0    SRS 0.0    SDS 2.0    TID 0.99  LV sys vol 27.0 mL   LV dias vol 79.0 62 - 150 mL   Nuc Stress EF 66 %   ST Depression (mm) 0 mm       10/10/2022    8:44 AM 10/02/2022    1:29 PM 09/14/2022    3:07 PM 09/03/2022    2:40 PM 08/31/2022    2:17 PM  Depression screen PHQ 2/9  Decreased Interest 0 0 0 2 2  Down, Depressed, Hopeless 0 0 0 2 2  PHQ - 2 Score 0 0 0 4 4  Altered sleeping 0 2 0 0 2  Tired, decreased energy 0 0 0 0 2  Change in appetite 0 0 0 0 2  Feeling bad or failure about yourself  0 0 0 0 2  Trouble concentrating 0 0 0 0 2  Moving slowly or fidgety/restless 0 0 0 0 2  Suicidal thoughts 0 0 0 0 2  PHQ-9 Score 0 2 0 4 18   Difficult doing work/chores Not difficult at all Not difficult at all Not difficult at all Somewhat difficult Very difficult       10/10/2022    8:44 AM 10/02/2022    1:38 PM 09/14/2022    3:04 PM 09/03/2022    2:41 PM  GAD 7 : Generalized Anxiety Score  Nervous, Anxious, on Edge 1 1 1 1   Control/stop worrying 0 1 1 1   Worry too much - different things 0 1 1 1   Trouble relaxing 0 1 1 0  Restless 0 1 1 0  Easily annoyed or irritable 0 1 1 1   Afraid - awful might happen 0 1 1 1   Total GAD 7 Score 1 7 7 5   Anxiety Difficulty Not difficult at all Somewhat difficult Somewhat difficult Somewhat difficult   Pertinent labs & imaging results that were available during my care of the patient were reviewed by me and considered in my medical decision making.  Assessment & Plan:  Eric "Ray" was seen today for tinnitus.  Diagnoses and all orders for this visit:  Acute otitis externa of left ear, unspecified type Due to continued tinnitus and pain, will start medication as below and refer to specialty for evaluation.  -     amoxicillin (AMOXIL) 875 MG tablet; Take 1 tablet (875 mg total) by mouth 2 (two) times daily for 7 days.  Tinnitus of left ear Due to continued tinnitus and pain, will start medication as below and refer to specialty for evaluation. Instructed patient to discontinue lasix and will start hydrochlorothiazide to minimize ototoxic effects.  -     Ambulatory referral to ENT -     amoxicillin (AMOXIL) 875 MG tablet; Take 1 tablet (875 mg total) by mouth 2 (two) times daily for 7 days. -     hydrochlorothiazide (HYDRODIURIL) 25 MG tablet; Take 1 tablet (25 mg total) by mouth daily.  Decreased GFR Patient has history of hyponatremia and recently had decreased GFR. Will repeat labs as below.  -     CMP14+EGFR  Primary hypertension Not at goal in office. Patient is very concerned for elevation. Instructed patient to monitor his BP at home. He verbalized access to a monitor.  Instructed patient to bring measurements and monitor to follow up next week with PCP to review and determine plan.   Continue all other maintenance medications.  Follow up plan: Return for With PCP next week..   Continue healthy lifestyle choices, including diet (rich in fruits, vegetables, and  lean proteins, and low in salt and simple carbohydrates) and exercise (at least 30 minutes of moderate physical activity daily).  Written and verbal instructions provided   The above assessment and management plan was discussed with the patient. The patient verbalized understanding of and has agreed to the management plan. Patient is aware to call the clinic if they develop any new symptoms or if symptoms persist or worsen. Patient is aware when to return to the clinic for a follow-up visit. Patient educated on when it is appropriate to go to the emergency department.   Neale Burly, DNP-FNP [facility] [address] [place], [place] [address] [phone]

## 2022-10-11 NOTE — Progress Notes (Signed)
Renal function has improved from last visit. Recommend patient continue to avoid NSAIDs and maintain adequate hydration 80-100 oz of water daily.

## 2022-10-16 NOTE — Addendum Note (Signed)
Addended by: Neale Burly on: 10/16/2022 08:03 AM   Modules accepted: Orders

## 2022-10-19 ENCOUNTER — Encounter: Payer: Self-pay | Admitting: Family Medicine

## 2022-10-19 ENCOUNTER — Ambulatory Visit (INDEPENDENT_AMBULATORY_CARE_PROVIDER_SITE_OTHER): Payer: Medicare HMO | Admitting: Family Medicine

## 2022-10-19 VITALS — BP 139/66 | HR 89 | Temp 98.8°F | Ht 69.0 in | Wt 214.0 lb

## 2022-10-19 DIAGNOSIS — R600 Localized edema: Secondary | ICD-10-CM | POA: Diagnosis not present

## 2022-10-19 DIAGNOSIS — H60502 Unspecified acute noninfective otitis externa, left ear: Secondary | ICD-10-CM

## 2022-10-19 MED ORDER — FUROSEMIDE 20 MG PO TABS
20.0000 mg | ORAL_TABLET | Freq: Every day | ORAL | 0 refills | Status: DC | PRN
Start: 2022-10-19 — End: 2023-01-09

## 2022-10-19 MED ORDER — AMOXICILLIN-POT CLAVULANATE 875-125 MG PO TABS
1.0000 | ORAL_TABLET | Freq: Two times a day (BID) | ORAL | 0 refills | Status: DC
Start: 2022-10-19 — End: 2022-12-03

## 2022-10-19 NOTE — Patient Instructions (Signed)
Sweet oil for ear pain. Antibiotic sent. Lasix renewed. STOP hydrochlorothiazide

## 2022-10-19 NOTE — Progress Notes (Signed)
Subjective: CC: Follow-up left ear pain PCP: Raliegh Ip, DO Eric Cook is a 77 y.o. male presenting to clinic today for:  1.  Left ear pain Patient has been seen x 2 for otitis externa.  He apparently has been referred to ENT for further evaluation as well.  He notes that his Lasix was switched to hydrochlorothiazide because it was felt that perhaps Lasix may be contributory.  He has not responded to the topical treatments for the left ear pain.  He reports no drainage or fevers.  His biggest concern with the hydrochlorothiazide is that he is "having to pee every 5 minutes".  He wants to go back on Lasix which he used only as needed.   ROS: Per HPI  No Known Allergies Past Medical History:  Diagnosis Date   Anxiety    HLD (hyperlipidemia)    Hyperlipidemia    Hypertension    Peptic ulcer    Seizures (HCC)    medication related    Current Outpatient Medications:    acetaminophen (TYLENOL) 500 MG tablet, Take 500 mg by mouth as needed for moderate pain or mild pain (As Needed)., Disp: , Rfl:    amoxicillin-clavulanate (AUGMENTIN) 875-125 MG tablet, Take 1 tablet by mouth 2 (two) times daily., Disp: 20 tablet, Rfl: 0   cetirizine (ZYRTEC) 10 MG tablet, Take 0.5 tablets (5 mg total) by mouth daily., Disp: 30 tablet, Rfl: 5   cimetidine (TAGAMET) 400 MG tablet, TAKE 1 TABLET TWICE DAILY AS NEEDED, Disp: 180 tablet, Rfl: 3   dicyclomine (BENTYL) 10 MG capsule, TAKE 2 CAPSULES (20 MG) 15 MINUTES BEFORE MEALS AS NEEDED; office visit for further refills, Disp: 90 capsule, Rfl: 1   fluticasone (FLONASE) 50 MCG/ACT nasal spray, USE 2 SPRAYS IN EACH NOSTRIL EVERY DAY, Disp: 48 each, Rfl: 3   fluticasone furoate-vilanterol (BREO ELLIPTA) 200-25 MCG/ACT AEPB, Inhale 1 puff into the lungs daily., Disp: 60 each, Rfl: 5   furosemide (LASIX) 20 MG tablet, Take 1 tablet (20 mg total) by mouth daily as needed for fluid., Disp: 90 tablet, Rfl: 0   levETIRAcetam (KEPPRA XR) 500 MG 24  hr tablet, Take 1 tablet (500 mg total) by mouth at bedtime., Disp: 90 tablet, Rfl: 3   losartan (COZAAR) 25 MG tablet, TAKE 1 TABLET (25 MG TOTAL) BY MOUTH DAILY., Disp: 30 tablet, Rfl: 5   pantoprazole (PROTONIX) 40 MG tablet, Take 1 tablet (40 mg total) by mouth daily. Office visit for further refills, Disp: 90 tablet, Rfl: 0   sertraline (ZOLOFT) 50 MG tablet, Take 1 tablet (50 mg total) by mouth daily., Disp: 90 tablet, Rfl: 0   simvastatin (ZOCOR) 20 MG tablet, Take 1 tablet (20 mg total) by mouth daily at 6 PM., Disp: 90 tablet, Rfl: 3   tamsulosin (FLOMAX) 0.4 MG CAPS capsule, TAKE 1 CAPSULE (0.4 MG TOTAL) BY MOUTH DAILY., Disp: 90 capsule, Rfl: 0   traZODone (DESYREL) 50 MG tablet, Take 0.5-1 tablets (25-50 mg total) by mouth at bedtime as needed for sleep., Disp: 90 tablet, Rfl: 0 Social History   Socioeconomic History   Marital status: Single    Spouse name: Not on file   Number of children: 1   Years of education: Not on file   Highest education level: Not on file  Occupational History   Occupation: Lobbyist    Comment: part time  Tobacco Use   Smoking status: Former    Current packs/day: 0.00    Average  packs/day: 1 pack/day for 54.0 years (54.0 ttl pk-yrs)    Types: Cigarettes    Start date: 04/1965    Quit date: 04/2019    Years since quitting: 3.4   Smokeless tobacco: Never  Vaping Use   Vaping status: Never Used  Substance and Sexual Activity   Alcohol use: Never   Drug use: Never   Sexual activity: Never  Other Topics Concern   Not on file  Social History Narrative   Lives alone   Right Handed   Drinks 2-3 cups of caffeine daily   Son in [place]   Social Determinants of Health   Financial Resource Strain: Low Risk  (06/22/2022)   Overall Financial Resource Strain (CARDIA)    Difficulty of Paying Living Expenses: Not hard at all  Food Insecurity: No Food Insecurity (08/03/2022)   Hunger Vital Sign    Worried About Running Out of Food  in the Last Year: Never true    Ran Out of Food in the Last Year: Never true  Transportation Needs: No Transportation Needs (08/03/2022)   PRAPARE - Administrator, Civil Service (Medical): No    Lack of Transportation (Non-Medical): No  Physical Activity: Sufficiently Active (06/22/2022)   Exercise Vital Sign    Days of Exercise per Week: 7 days    Minutes of Exercise per Session: 30 min  Stress: No Stress Concern Present (06/22/2022)   [employer] of Occupational Health - Occupational Stress Questionnaire    Feeling of Stress : Not at all  Social Connections: Socially Integrated (06/22/2022)   Social Connection and Isolation Panel [NHANES]    Frequency of Communication with Friends and Family: More than three times a week    Frequency of Social Gatherings with Friends and Family: More than three times a week    Attends Religious Services: More than 4 times per year    Active Member of [employer] or Organizations: Yes    Attends Engineer, structural: More than 4 times per year    Marital Status: Married  Catering manager Violence: Not At Risk (08/03/2022)   Humiliation, Afraid, Rape, and Kick questionnaire    Fear of Current or Ex-Partner: No    Emotionally Abused: No    Physically Abused: No    Sexually Abused: No   Family History  Problem Relation Age of Onset   Colon cancer Brother    Other Son        vertigo   Kidney Stones Son    Esophageal cancer Neg Hx    Pancreatic cancer Neg Hx    Stomach cancer Neg Hx     Objective: Office vital signs reviewed. BP 139/66   Pulse 89   Temp 98.8 F (37.1 C)   Ht 5\' 9"  (1.753 m)   Wt 214 lb (97.1 kg)   SpO2 95%   BMI 31.60 kg/m   Physical Examination:  General: Awake, alert, well nourished, No acute distress HEENT: Sclera white.  Moist mucous membranes.  No lymphadenopathy appreciated in the cervical region.  He has no appreciable purulence behind the tympanic membrane and actually no significant soft  tissue swelling in the external auditory canal.  Minimal erythema located here but is exquisitely tender with examination. Cardio: regular rate and rhythm, S1S2 heard, no murmurs appreciated Pulm: clear to auscultation bilaterally, no wheezes, rhonchi or rales; normal work of breathing on room air  Assessment/ Plan: 77 y.o. male   Acute otitis externa of left ear, unspecified type -  Plan: amoxicillin-clavulanate (AUGMENTIN) 875-125 MG tablet  Pedal edema - Plan: furosemide (LASIX) 20 MG tablet  I am going to empirically treated with Augmentin but again I really do not appreciate any significant infectious etiology going on.  I agree with ENT referral   Because having significant urination with HCTZ he requested Lasix and so I have switched him back.  We discussed diet modification, continued salt restriction in efforts to bring down blood pressure and swelling naturally   Raliegh Ip, DO [facility] [phone]

## 2022-10-23 ENCOUNTER — Telehealth: Payer: Self-pay | Admitting: Pulmonary Disease

## 2022-10-23 NOTE — Telephone Encounter (Signed)
Needs a refill for fluticasone furoate-vilanterol (BREO ELLIPTA) 200-25 MCG/ACT AEPB   [facility]

## 2022-10-29 ENCOUNTER — Other Ambulatory Visit: Payer: Self-pay | Admitting: Pulmonary Disease

## 2022-10-29 MED ORDER — FLUTICASONE FUROATE-VILANTEROL 200-25 MCG/ACT IN AEPB: 1.0000 | INHALATION_SPRAY | Freq: Every day | RESPIRATORY_TRACT | 2 refills | Status: DC

## 2022-10-30 ENCOUNTER — Ambulatory Visit: Payer: Medicare HMO | Admitting: Cardiovascular Disease

## 2022-11-02 ENCOUNTER — Encounter: Payer: Self-pay | Admitting: Pulmonary Disease

## 2022-11-14 DIAGNOSIS — M533 Sacrococcygeal disorders, not elsewhere classified: Secondary | ICD-10-CM | POA: Diagnosis not present

## 2022-11-14 DIAGNOSIS — M47896 Other spondylosis, lumbar region: Secondary | ICD-10-CM | POA: Diagnosis not present

## 2022-11-14 DIAGNOSIS — G5712 Meralgia paresthetica, left lower limb: Secondary | ICD-10-CM | POA: Diagnosis not present

## 2022-11-20 ENCOUNTER — Other Ambulatory Visit: Payer: Self-pay | Admitting: Family Medicine

## 2022-11-20 DIAGNOSIS — R4589 Other symptoms and signs involving emotional state: Secondary | ICD-10-CM

## 2022-11-20 DIAGNOSIS — F411 Generalized anxiety disorder: Secondary | ICD-10-CM

## 2022-11-21 ENCOUNTER — Other Ambulatory Visit: Payer: Self-pay | Admitting: Family Medicine

## 2022-11-21 ENCOUNTER — Ambulatory Visit: Payer: Medicare HMO | Attending: Family Medicine | Admitting: Audiologist

## 2022-11-21 DIAGNOSIS — H903 Sensorineural hearing loss, bilateral: Secondary | ICD-10-CM | POA: Insufficient documentation

## 2022-11-21 DIAGNOSIS — H9319 Tinnitus, unspecified ear: Secondary | ICD-10-CM | POA: Insufficient documentation

## 2022-11-21 DIAGNOSIS — F5104 Psychophysiologic insomnia: Secondary | ICD-10-CM

## 2022-11-21 NOTE — Procedures (Signed)
Outpatient Audiology and [facility] [address] [place], [place]  [address] [phone]  AUDIOLOGICAL  EVALUATION  NAME: [name]     DOB:   1945-09-12      MRN: [id number]                                                                                     DATE: 11/21/2022     REFERENT: Raliegh Ip, DO STATUS: Outpatient DIAGNOSIS: Sensorineural Hearing Loss Bilateral, Tinnitus    History: [name] was seen for an audiological evaluation due to concerns for ringing in his left ear after having an ear infection. He describes it as a high pitched sound. He has difficulty describing the tinnitus. He only hears it when in quiet. He does not remember if he had tinnitus before the ear infection. He denies any difficulty hearing now that the infection has resolved. He used to receive hearing tests annually for occupational noise exposure. He has not recently had a hearing test.   Evaluation:  Otoscopy showed a clear view of the tympanic membranes, bilaterally Tympanometry results were consistent with normal middle ear function, bilaterally   Audiometric testing was completed using Conventional Audiometry techniques with insert earphones and TDH headphones. Test results are consistent with normal hearing sloping after 500Hz  to a profound sensorineural  hearing loss. Speech Recognition Thresholds were obtained at 35dB HL in the right ear and at 30dB HL in the left ear. Word Recognition Testing was completed at 40 dBSL and [name] scored 100% in the right ear and 100% in the left ear.    Results:  The test results were reviewed with Eric Cook. He has a sloping sensorineural hearing loss in both ears, likely from age and noise exposure. The high pitch of his tinnitus is likely due to the severe sensorineural hearing loss in the high pitches of his hearing. Recommended hearing aids but he was not interested. Instead reviewed the tinnitus packet on masking. He does not feel the  tinnitus is bad enough to warrant doing anything.   Recommendations: 1.   Monitor hearing loss annually. Recommend hearing aids when ready for daily use.    33 minutes spent testing and counseling on results.   If you have any questions please feel free to contact me at [phone].  Ammie Ferrier Au.D.  Audiologist   11/21/2022  2:40 PM  Cc: Raliegh Ip, DO

## 2022-11-25 ENCOUNTER — Other Ambulatory Visit: Payer: Self-pay | Admitting: Internal Medicine

## 2022-11-28 DIAGNOSIS — G5712 Meralgia paresthetica, left lower limb: Secondary | ICD-10-CM | POA: Diagnosis not present

## 2022-12-03 ENCOUNTER — Encounter: Payer: Self-pay | Admitting: Pulmonary Disease

## 2022-12-03 ENCOUNTER — Ambulatory Visit: Payer: Medicare HMO | Admitting: Pulmonary Disease

## 2022-12-03 VITALS — BP 138/76 | HR 76 | Temp 97.9°F | Ht 69.0 in | Wt 215.0 lb

## 2022-12-03 DIAGNOSIS — J449 Chronic obstructive pulmonary disease, unspecified: Secondary | ICD-10-CM | POA: Diagnosis not present

## 2022-12-03 MED ORDER — FLUTICASONE FUROATE-VILANTEROL 200-25 MCG/ACT IN AEPB: 1.0000 | INHALATION_SPRAY | Freq: Every day | RESPIRATORY_TRACT | 11 refills | Status: DC

## 2022-12-03 MED ORDER — TRELEGY ELLIPTA 200-62.5-25 MCG/ACT IN AEPB: 1.0000 | INHALATION_SPRAY | Freq: Every day | RESPIRATORY_TRACT | Status: DC

## 2022-12-03 NOTE — Progress Notes (Signed)
@Patient  ID: Eric Cook, male    DOB: 1945/05/04, 77 y.o.   MRN: [id number]  Chief Complaint  Patient presents with   77 y.o. Follow-up    Needs refills on BREO.  Doing well with breathing.    Referring provider: Raliegh Ip, DO  HPI:   77 y.o. man whom are seen in follow up for evaluation of chronic cough.  Most recent note from PCP reviewed.  Cough is ok.  Breo seems to control it somewhat well.  Does have breakthrough cough.  Shortness of breath also a bit better overall.  Still with residual dyspnea on exertion.  Discussed role and rationale for escalating therapies as it relates to symptom control.  HPI at initial visit: Patient has daily cough.  At least present for 2 years.  On further questioning likely present longer.  Intermittent cough for 20+ years.  Worse in the mornings.  And in the evenings.  Productive of sputum about 40% of the time.  Sometimes has cough and cannot bring up sputum.  He is able to mobilize sputum cough seems to improve.  Recently put on PPI about 2 weeks ago.  This is helped cough about 30%.  Has decreased in frequency and severity of cough a bit.  He continues to have ongoing sputum production.  He does endorse nasal congestion, postnasal drip symptoms.  Uses Flonase daily.  He is a former smoker, 40-pack-year, updated today as was underrepresented in the social history tab.  No seasonal environmental factors he can identify that makes the cough better or worse.  No position makes things better or worse.  No other alleviating or exacerbating factors.  Has not used inhalers.  Reviewed most recent chest x-ray 08/2019 that on my review and interpretation reveals mild hyperinflation, bronchitic changes in the bases.  PMH: Stomach pain, hypertension, GERD, history of tobacco abuse in remission Surgical history: Reviewed, denies any Family history: No significant rest or illness in first relatives Social history: Former smoker, at least 40-pack-year, quit  2020  Questionaires / Pulmonary Flowsheets:   ACT:      No data to display          MMRC:     No data to display          Epworth:      No data to display          Tests:   FENO:  No results found for: "NITRICOXIDE"  PFT:    Latest Ref Rng & Units 12/19/2021    9:44 AM  PFT Results  FVC-Pre L 3.14   FVC-Predicted Pre % 78   FVC-Post L 3.25   FVC-Predicted Post % 81   Pre FEV1/FVC % % 72   Post FEV1/FCV % % 72   FEV1-Pre L 2.26   FEV1-Predicted Pre % 78   FEV1-Post L 2.33   DLCO uncorrected ml/min/mmHg 16.73   DLCO UNC% % 69   DLCO corrected ml/min/mmHg 16.73   DLCO COR %Predicted % 69   DLVA Predicted % 74   Personally reviewed and interpreted as normal spirometry, no bronchodilator response.  Lung volumes are normal limits DLCO moderately reduced.  WALK:      No data to display          Imaging: Personally reviewed and as per EMR discussion this note No results found.  Lab Results: Personally reviewed CBC    Component Value Date/Time   WBC 9.5 08/09/2022 1400   RBC 4.62 08/09/2022 1400  HGB 14.5 08/09/2022 1400   HGB 14.2 08/07/2022 1442   HCT 43.6 08/09/2022 1400   HCT 41.6 08/07/2022 1442   PLT 399 08/09/2022 1400   PLT 338 08/07/2022 1442   MCV 94.4 08/09/2022 1400   MCV 95 08/07/2022 1442   MCH 31.4 08/09/2022 1400   MCHC 33.3 08/09/2022 1400   RDW 12.2 08/09/2022 1400   RDW 12.3 08/07/2022 1442   LYMPHSABS 1.2 08/07/2022 1442   MONOABS 1.0 08/03/2022 0858   EOSABS 0.2 08/07/2022 1442   BASOSABS 0.1 08/07/2022 1442    BMET    Component Value Date/Time   NA 142 10/10/2022 1019   K 4.0 10/10/2022 1019   CL 103 10/10/2022 1019   CO2 24 10/10/2022 1019   GLUCOSE 99 10/10/2022 1019   GLUCOSE 142 (H) 08/09/2022 1400   BUN 15 10/10/2022 1019   CREATININE 1.16 10/10/2022 1019   CALCIUM 9.8 10/10/2022 1019   GFRNONAA >60 08/09/2022 1400   GFRAA 77 09/08/2019 1620    BNP    Component Value Date/Time   BNP 6.7  08/07/2022 1442   BNP 98.5 08/03/2022 0858    ProBNP No results found for: "PROBNP"  Specialty Problems       Pulmonary Problems   COPD (chronic obstructive pulmonary disease) (HCC)    No Known Allergies  Immunization History  Administered Date(s) Administered   Fluad Quad(high Dose 76+) 10/14/2019, 11/03/2020, 10/18/2021   Moderna Sars-Covid-2 Vaccination 03/18/2019, 04/15/2019, 12/01/2019   PNEUMOCOCCAL CONJUGATE-20 11/03/2020   Pfizer(Comirnaty)Fall Seasonal Vaccine 12 years and older 01/17/2022   Pneumococcal Conjugate-13 10/14/2019    Past Medical History:  Diagnosis Date   Anxiety    HLD (hyperlipidemia)    Hyperlipidemia    Hypertension    Peptic ulcer    Seizures (HCC)    medication related    Tobacco History: Social History   Tobacco Use  Smoking Status Former   Current packs/day: 0.00   Average packs/day: 1 pack/day for 54.0 years (54.0 ttl pk-yrs)   Types: Cigarettes   Start date: 04/1965   Quit date: 04/2019   Years since quitting: 3.6  Smokeless Tobacco Never   Counseling given: Not Answered   Continue to not smoke  Outpatient Encounter Medications as of 12/03/2022  Medication Sig   acetaminophen (TYLENOL) 500 MG tablet Take 500 mg by mouth as needed for moderate pain or mild pain (As Needed).   busPIRone (BUSPAR) 10 MG tablet Take 10 mg by mouth 3 (three) times daily.   cetirizine (ZYRTEC) 10 MG tablet Take 0.5 tablets (5 mg total) by mouth daily.   cimetidine (TAGAMET) 400 MG tablet TAKE 1 TABLET TWICE DAILY AS NEEDED   dicyclomine (BENTYL) 10 MG capsule TAKE 2 CAPSULES (20 MG) 15 MINUTES BEFORE MEALS AS NEEDED; office visit for further refills   fluticasone (FLONASE) 50 MCG/ACT nasal spray USE 2 SPRAYS IN EACH NOSTRIL EVERY DAY   furosemide (LASIX) 20 MG tablet Take 1 tablet (20 mg total) by mouth daily as needed for fluid.   levETIRAcetam (KEPPRA XR) 500 MG 24 hr tablet Take 1 tablet (500 mg total) by mouth at bedtime.   pantoprazole  (PROTONIX) 40 MG tablet Take 1 tablet (40 mg total) by mouth daily. Office visit for further refills   sertraline (ZOLOFT) 50 MG tablet TAKE 1 TABLET BY MOUTH EVERY DAY   simvastatin (ZOCOR) 20 MG tablet Take 1 tablet (20 mg total) by mouth daily at 6 PM.   tamsulosin (FLOMAX) 0.4 MG CAPS capsule  TAKE 1 CAPSULE (0.4 MG TOTAL) BY MOUTH DAILY.   traZODone (DESYREL) 50 MG tablet TAKE 0.5-1 TABLETS BY MOUTH AT BEDTIME AS NEEDED FOR SLEEP.   [DISCONTINUED] amoxicillin-clavulanate (AUGMENTIN) 875-125 MG tablet Take 1 tablet by mouth 2 (two) times daily.   [DISCONTINUED] fluticasone furoate-vilanterol (BREO ELLIPTA) 200-25 MCG/ACT AEPB Inhale 1 puff into the lungs daily.   fluticasone furoate-vilanterol (BREO ELLIPTA) 200-25 MCG/ACT AEPB Inhale 1 puff into the lungs daily.   losartan (COZAAR) 25 MG tablet TAKE 1 TABLET (25 MG TOTAL) BY MOUTH DAILY.   No facility-administered encounter medications on file as of 12/03/2022.     Review of Systems  Review of Systems  N/a Physical Exam  BP 138/76 (BP Location: Left Arm, Patient Position: Sitting, Cuff Size: Large)   Pulse 76   Temp 97.9 F (36.6 C) (Oral)   Ht 5\' 9"  (1.753 m)   Wt 215 lb (97.5 kg)   SpO2 95%   BMI 31.75 kg/m   Wt Readings from Last 5 Encounters:  12/03/22 215 lb (97.5 kg)  10/19/22 214 lb (97.1 kg)  10/10/22 213 lb (96.6 kg)  10/02/22 211 lb (95.7 kg)  09/14/22 206 lb (93.4 kg)    BMI Readings from Last 5 Encounters:  12/03/22 31.75 kg/m  10/19/22 31.60 kg/m  10/10/22 31.45 kg/m  10/02/22 31.16 kg/m  09/14/22 30.42 kg/m     Physical Exam General: Well-appearing, no acute distress Eyes: EOMI, no icterus Neck: Supple: No JVP Pulmonary: Clear, normal work of breathing Abdomen: Nondistended, bowel sounds present Cardiovascular: Warm, no edema MSK: No synovitis, no joint effusion Neuro: Normal gait, no weakness Psych: Normal mood, full affect   Assessment & Plan:   Chronic cough: Likely  multifactorial.  Mild improvement with PPI therapy.  Likely component of GERD.  Ongoing sputum production begs question of chronic bronchitis given daily productive cough for over 2 years as it  relates to history of significant cigarette smoking.  Better with ICS/LABA. But persistent. Escalate Breo 1 puff daily to Trelegy.  New prescription  today.  May need paper for manufacturing assistance in the future if too costly.  Dyspnea on exertion: Mild.  PFTs largely normal.  Asthma possible. Trelegy as above.  Return in about 1 year (around 12/03/2023) for f/u Dr. Judeth Horn.   Karren Burly, MD 12/03/2022

## 2022-12-03 NOTE — Patient Instructions (Signed)
Nice to see you again  Try Trelegy 1 puff once a day, rinse your mouth out with water after every use.  This replaces Breo.  I went ahead and refilled the Breo.  We can apply for Trelegy manufacturing assistance in the new year.  Especially if it seems to help more than the [facility].  Return to clinic in 1 year or sooner as needed with Dr. Judeth Horn

## 2022-12-03 NOTE — Addendum Note (Signed)
Addended byClyda Greener M on: 12/03/2022 04:22 PM   Modules accepted: Orders

## 2022-12-04 ENCOUNTER — Other Ambulatory Visit: Payer: Self-pay | Admitting: Family Medicine

## 2022-12-09 ENCOUNTER — Other Ambulatory Visit: Payer: Self-pay | Admitting: Family Medicine

## 2022-12-09 DIAGNOSIS — E782 Mixed hyperlipidemia: Secondary | ICD-10-CM

## 2022-12-12 ENCOUNTER — Other Ambulatory Visit: Payer: Self-pay | Admitting: Internal Medicine

## 2022-12-12 MED ORDER — PANTOPRAZOLE SODIUM 40 MG PO TBEC: 40.0000 mg | DELAYED_RELEASE_TABLET | Freq: Every day | ORAL | 0 refills | Status: DC

## 2022-12-26 DIAGNOSIS — M533 Sacrococcygeal disorders, not elsewhere classified: Secondary | ICD-10-CM | POA: Diagnosis not present

## 2022-12-26 DIAGNOSIS — M47816 Spondylosis without myelopathy or radiculopathy, lumbar region: Secondary | ICD-10-CM | POA: Diagnosis not present

## 2022-12-26 DIAGNOSIS — G5712 Meralgia paresthetica, left lower limb: Secondary | ICD-10-CM | POA: Diagnosis not present

## 2022-12-30 ENCOUNTER — Other Ambulatory Visit: Payer: Self-pay | Admitting: Pulmonary Disease

## 2023-01-02 DIAGNOSIS — M545 Low back pain, unspecified: Secondary | ICD-10-CM | POA: Diagnosis not present

## 2023-01-02 DIAGNOSIS — M62830 Muscle spasm of back: Secondary | ICD-10-CM | POA: Diagnosis not present

## 2023-01-03 NOTE — Telephone Encounter (Signed)
Per pharmacy, Virgel Bouquet is not covered by patient's insurance. They have suggested the following alternatives instead:   budesonide-formoterol (SYMBICORT) 80-4.5 MCG/ACT inhaler  fluticasone-salmeterol (ADVAIR HFA) 45-21 MCG/ACT inhaler fluticasone-salmeterol (WIXELA INHUB) 100-50 MCG/ACT AEPB  Dr. Judeth Horn, can you please advise? Thanks!

## 2023-01-04 NOTE — Telephone Encounter (Signed)
He is on a trial of Trelegy currently.

## 2023-01-09 ENCOUNTER — Other Ambulatory Visit: Payer: Self-pay | Admitting: Family Medicine

## 2023-01-09 DIAGNOSIS — R600 Localized edema: Secondary | ICD-10-CM

## 2023-01-21 ENCOUNTER — Other Ambulatory Visit: Payer: Self-pay | Admitting: Acute Care

## 2023-01-21 DIAGNOSIS — Z122 Encounter for screening for malignant neoplasm of respiratory organs: Secondary | ICD-10-CM

## 2023-01-21 DIAGNOSIS — Z87891 Personal history of nicotine dependence: Secondary | ICD-10-CM

## 2023-02-09 ENCOUNTER — Other Ambulatory Visit: Payer: Self-pay | Admitting: Family Medicine

## 2023-02-09 ENCOUNTER — Other Ambulatory Visit: Payer: Self-pay | Admitting: Adult Health

## 2023-02-09 DIAGNOSIS — K219 Gastro-esophageal reflux disease without esophagitis: Secondary | ICD-10-CM

## 2023-02-11 NOTE — Telephone Encounter (Signed)
Last seen on 02/22/22 Follow up scheduled on 02/20/23

## 2023-02-13 ENCOUNTER — Ambulatory Visit: Payer: Medicare HMO | Admitting: Internal Medicine

## 2023-02-13 ENCOUNTER — Encounter: Payer: Self-pay | Admitting: Internal Medicine

## 2023-02-13 VITALS — BP 120/68 | HR 76 | Ht 69.0 in | Wt 224.0 lb

## 2023-02-13 DIAGNOSIS — R1310 Dysphagia, unspecified: Secondary | ICD-10-CM

## 2023-02-13 DIAGNOSIS — K222 Esophageal obstruction: Secondary | ICD-10-CM

## 2023-02-13 DIAGNOSIS — R194 Change in bowel habit: Secondary | ICD-10-CM

## 2023-02-13 DIAGNOSIS — K219 Gastro-esophageal reflux disease without esophagitis: Secondary | ICD-10-CM | POA: Diagnosis not present

## 2023-02-13 DIAGNOSIS — K209 Esophagitis, unspecified without bleeding: Secondary | ICD-10-CM | POA: Diagnosis not present

## 2023-02-13 MED ORDER — PANTOPRAZOLE SODIUM 40 MG PO TBEC: 40.0000 mg | DELAYED_RELEASE_TABLET | Freq: Every day | ORAL | 3 refills | Status: DC

## 2023-02-13 MED ORDER — DICYCLOMINE HCL 10 MG PO CAPS: ORAL_CAPSULE | ORAL | 1 refills | Status: DC

## 2023-02-13 NOTE — Progress Notes (Signed)
HISTORY OF PRESENT ILLNESS:  Eric Cook is a 78 y.o. male with history of GERD complicated by esophagitis and peptic stricture for which she underwent upper endoscopy and esophageal dilation (55 Eric Cook) July 14, 2021.  He was seen in follow-up August 31, 2021 and doing well.  See that dictation.  At that time he was to continue reflux precautions, pantoprazole 40 mg daily, and dicyclomine as needed.  Routine follow-up in 1 year recommended.  Patient tells me that as long as he is taking his pantoprazole, he does well.  He did run out of his medication and had significant reflux symptoms.  He is now back on medication.  No issues with dysphagia.  For occasional abdominal cramping he does take dicyclomine.  This helps.  No new problems or complaints  REVIEW OF SYSTEMS:  All non-GI ROS negative. Past Medical History:  Diagnosis Date   Anxiety    HLD (hyperlipidemia)    Hyperlipidemia    Hypertension    Peptic ulcer    Seizures (HCC)    medication related    Past Surgical History:  Procedure Laterality Date   LUMBAR DISC SURGERY     L3-L4   UPPER GASTROINTESTINAL ENDOSCOPY      Social History [name]  reports that he quit smoking about 3 years ago. His smoking use included cigarettes. He started smoking about 57 years ago. He has a 54 pack-year smoking history. He has never used smokeless tobacco. He reports that he does not drink alcohol and does not use drugs.  family history includes Colon cancer in his brother; Kidney Stones in his son; Other in his son.  No Known Allergies     PHYSICAL EXAMINATION: Vital signs: BP 120/68   Pulse 76   Ht 5\' 9"  (1.753 m)   Wt 224 lb (101.6 kg)   BMI 33.08 kg/m   Constitutional: generally well-appearing, no acute distress Psychiatric: alert and oriented x3, cooperative Eyes: extraocular movements intact, anicteric, conjunctiva pink Mouth: oral pharynx moist, no lesions Neck: supple no lymphadenopathy Cardiovascular:  heart regular rate and rhythm, no murmur Lungs: clear to auscultation bilaterally Abdomen: soft, nontender, nondistended, no obvious ascites, no peritoneal signs, normal bowel sounds, no organomegaly Rectal: Omitted Extremities: no clubbing, cyanosis, or lower extremity edema bilaterally Skin: no lesions on visible extremities Neuro: No focal deficits.  Cranial nerves intact  ASSESSMENT:  1.  GERD with associated esophagitis and symptomatic peptic stricture.  Status post EGD with dilation June 2003.  Currently asymptomatic post dilation on PPI 2.  Occasional postprandial urgency with loose stools for which dicyclomine helps. 3.  Negative Cologuard testing July 2023    PLAN:  1.  Reflux precautions 2.  Refill pantoprazole 40 mg daily.  Medication risk reviewed 3.  Refill dicyclomine.  Medication risk reviewed 4.  Routine office follow-up 1 year.  Contact the office in the interim for any questions or problems

## 2023-02-13 NOTE — Patient Instructions (Signed)
We have sent the following medications to your pharmacy for you to pick up at your convenience: Pantopazole, Dicylomine  Please follow up in a year.  _______________________________________________________  If your blood pressure at your visit was 140/90 or greater, please contact your primary care physician to follow up on this.  _______________________________________________________  If you are age 77 or older, your body mass index should be between 23-30. Your Body mass index is 33.08 kg/m. If this is out of the aforementioned range listed, please consider follow up with your Primary Care Provider.  If you are age 56 or younger, your body mass index should be between 19-25. Your Body mass index is 33.08 kg/m. If this is out of the aformentioned range listed, please consider follow up with your Primary Care Provider.   ________________________________________________________  The [facility] providers would like to encourage you to use [facility] to communicate with providers for non-urgent requests or questions.  Due to long hold times on the telephone, sending your provider a message by [facility] may be a faster and more efficient way to get a response.  Please allow 48 business hours for a response.  Please remember that this is for non-urgent requests.  _______________________________________________________

## 2023-02-19 NOTE — Progress Notes (Unsigned)
[facility] [address] [place]. [place] [address] [phone]       OFFICE FOLLOW UP NOTE  Eric Cook Date of Birth:  [birth year] Medical Record Number:  [id number]   Referring MD: Eric Canard, DO  Reason for Referral: Seizure   Virtual Visit via Video Note  I connected with Eric Cook on 02/19/23 at  9:00 AM EST by a video enabled telemedicine application and verified that I am speaking with the correct person using two identifiers.  Location: Patient: *** Provider: in office, [facility]   I discussed the limitations of evaluation and management by telemedicine and the availability of in person appointments. The patient expressed understanding and agreed to proceed.    HPI:   Update 02/20/2023 JM: Patient returns for 1 year seizure follow-up via MyChart video visit.  Has been stable without any recurrent seizure activity.  Compliant on Keppra XR 500 mg daily, denies side effects.  Continues to maintain ADLs and IADLs independently as well as driving.  Routinely follows with PCP, lab work within the past year satisfactory.    History provided for reference purposes only Update 02/22/2022 JM: Patient returns for 1 year seizure follow-up unaccompanied.  Denies any seizure activity.  Compliant on Keppra XR 500mg  daily. Tolerating well.  Maintains ADLs and IADLs independently as well as driving.  Lab work routinely monitored by PCP. No concerns at this time.   Update 02/22/2021 JM: returns for 6 month seizure follow up unaccompanied.  Overall doing well.  Has not had any seizure activity.  Compliant on Keppra XR 500 mg daily without side effects.  Remains independent with ADLs and IADLs as well as driving.  CBC and CMP completed 08/2020 unremarkable.  No new concerns at this time.  Update 08/22/2020 JM: Eric Cook returns for 89-month seizure follow-up accompanied by his son.  He has been doing well since prior visit without any  seizure activity and Keppra 250 mg twice daily tolerating without side effects.  He does question possibility of stopping Keppra.  Repeat EEG 02/2020 normal without evidence of seizure activity.  He has since returned back to driving without any difficulty.  Maintains ADLs and IADLs independently.  He routinely follows with his PCP Dr. Nadine Cook every 3 to 4 months - has appt tomorrow and believes routine lab work will be completed at that time.  No further concerns at this time.  Update 02/22/2020 JM: Eric Cook returns for seizure follow-up accompanied by his son.  He has remained on Keppra 250 mg twice daily tolerating well without side effects and has not had any additional seizure type activity.  Recommended repeat EEG not yet completed -patient and son thought today's visit was to have this completed.  He questions return back to driving as it has been 6 months since any seizure activity.  No concerns at this time.  Initial consult visit 11/10/2019 Eric Cook: Eric Cook is a 78 year old Caucasian male with past medical history of hypertension, hyperlipidemia and anxiety who is seen today for initial office consultation visit for new onset seizures.  He is accompanied by his son Eric Cook today.  History is obtained from them, review of electronic medical records and I personally reviewed available imaging films in PACS.  Patient had an episode on 08/24/2019 of sudden onset of speech difficulties.  He blames this on having stopped Xanax suddenly which she had been taking long-term 2 days prior.  He had also been unable to sleep the night  before.  His son noticed that his speech did not make sense.  The patient called his son over the phone and kept on repeating saying I want to kill [name].  And the son came to his house and noticed him to be confused he was also haddock involuntary rubbing movement of his hands automatically.  He appeared disoriented and had word finding difficulties and trouble completing  sentences.  These symptoms lasted about 24 hours and gradually improve the next day.  He was admitted to [facility] where MRI scan of the brain showed no acute abnormality but showed mild generalized atrophy and old left basal ganglia lacune.  EEG on 08/24/2019 showed focal left frontotemporal slowing in the 3 to 6 Hz range.  CBC, BMP and urine drug screen were negative except for benzos.  Patient was seen by Dr. Gerilyn Cook neurologist who had strong suspicion for complex partial seizure and started up with patient on Keppra.  Patient has been taking Keppra 250 mg twice daily which seems to be tolerating well and has had no further episodes like this since he left the hospital.  He is been having some pain in his pelvis related to his prostate and has an upcoming appointment to see a urologist for that.  He denies any prior history of seizures, significant head injury with loss of consciousness, strokes or TIA.  There is no family history of epilepsy.  He denies drinking alcohol, using marijuana or drugs of abuse.  ROS:   14 system review of systems is positive for those listed in HPI and all other systems negative  PMH:  Past Medical History:  Diagnosis Date   Anxiety    HLD (hyperlipidemia)    Hyperlipidemia    Hypertension    Peptic ulcer    Seizures (HCC)    medication related    Social History:  Social History   Socioeconomic History   Marital status: Single    Spouse name: Not on file   Number of children: 1   Years of education: Not on file   Highest education level: Not on file  Occupational History   Occupation: Lobbyist    Comment: part time  Tobacco Use   Smoking status: Former    Current packs/day: 0.00    Average packs/day: 1 pack/day for 54.0 years (54.0 ttl pk-yrs)    Types: Cigarettes    Start date: 04/1965    Quit date: 04/2019    Years since quitting: 3.8   Smokeless tobacco: Never  Vaping Use   Vaping status: Never Used  Substance and  Sexual Activity   Alcohol use: Never   Drug use: Never   Sexual activity: Never  Other Topics Concern   Not on file  Social History Narrative   Lives alone   Right Handed   Drinks 2-3 cups of caffeine daily   Son in [place]   Social Drivers of Health   Financial Resource Strain: Low Risk  (06/22/2022)   Overall Financial Resource Strain (CARDIA)    Difficulty of Paying Living Expenses: Not hard at all  Food Insecurity: No Food Insecurity (08/03/2022)   Hunger Vital Sign    Worried About Running Out of Food in the Last Year: Never true    Ran Out of Food in the Last Year: Never true  Transportation Needs: No Transportation Needs (08/03/2022)   PRAPARE - Administrator, Civil Service (Medical): No    Lack of Transportation (Non-Medical): No  Physical Activity: Sufficiently Active (06/22/2022)   Exercise Vital Sign    Days of Exercise per Week: 7 days    Minutes of Exercise per Session: 30 min  Stress: No Stress Concern Present (06/22/2022)   [employer] of Occupational Health - Occupational Stress Questionnaire    Feeling of Stress : Not at all  Social Connections: Socially Integrated (06/22/2022)   Social Connection and Isolation Panel [NHANES]    Frequency of Communication with Friends and Family: More than three times a week    Frequency of Social Gatherings with Friends and Family: More than three times a week    Attends Religious Services: More than 4 times per year    Active Member of [employer] or Organizations: Yes    Attends Engineer, structural: More than 4 times per year    Marital Status: Married  Catering manager Violence: Not At Risk (08/03/2022)   Humiliation, Afraid, Rape, and Kick questionnaire    Fear of Current or Ex-Partner: No    Emotionally Abused: No    Physically Abused: No    Sexually Abused: No    Medications:   Current Outpatient Medications on File Prior to Visit  Medication Sig Dispense Refill   acetaminophen (TYLENOL)  500 MG tablet Take 500 mg by mouth as needed for moderate pain or mild pain (As Needed).     cetirizine (ZYRTEC) 10 MG tablet Take 0.5 tablets (5 mg total) by mouth daily. 30 tablet 5   cimetidine (TAGAMET) 400 MG tablet TAKE 1 TABLET TWICE DAILY AS NEEDED 180 tablet 0   dicyclomine (BENTYL) 10 MG capsule TAKE 2 CAPSULES (20 MG) 15 MINUTES BEFORE MEALS AS NEEDED; 90 capsule 1   fluticasone (FLONASE) 50 MCG/ACT nasal spray USE 2 SPRAYS IN EACH NOSTRIL EVERY DAY 48 each 3   fluticasone furoate-vilanterol (BREO ELLIPTA) 200-25 MCG/ACT AEPB Inhale 1 puff into the lungs daily. 60 each 11   furosemide (LASIX) 20 MG tablet TAKE 1 TABLET (20 MG TOTAL) BY MOUTH DAILY AS NEEDED FOR FLUID. 90 tablet 0   levETIRAcetam (KEPPRA XR) 500 MG 24 hr tablet TAKE 1 TABLET AT BEDTIME 90 tablet 0   losartan (COZAAR) 25 MG tablet TAKE 1 TABLET (25 MG TOTAL) BY MOUTH DAILY. 30 tablet 5   pantoprazole (PROTONIX) 40 MG tablet Take 1 tablet (40 mg total) by mouth daily. 90 tablet 3   sertraline (ZOLOFT) 50 MG tablet TAKE 1 TABLET BY MOUTH EVERY DAY 90 tablet 1   simvastatin (ZOCOR) 20 MG tablet TAKE 1 TABLET EVERY DAY AT 6 PM 90 tablet 1   tamsulosin (FLOMAX) 0.4 MG CAPS capsule TAKE 1 CAPSULE EVERY DAY 90 capsule 1   traZODone (DESYREL) 50 MG tablet TAKE 0.5-1 TABLETS BY MOUTH AT BEDTIME AS NEEDED FOR SLEEP. 90 tablet 3   No current facility-administered medications on file prior to visit.    Allergies:  No Known Allergies  Physical Exam There were no vitals filed for this visit.   There is no height or weight on file to calculate BMI.   General: well developed, well nourished, pleasant elderly Caucasian male, seated, in no evident distress Head: head normocephalic and atraumatic.   Neck: supple with no carotid or supraclavicular bruits Cardiovascular: regular rate and rhythm, no murmurs Musculoskeletal: no deformity Skin:  no rash/petichiae Vascular:  Normal pulses all extremities  Neurologic Exam Mental  Status: Awake and fully alert.  Fluent speech and language.  Oriented to place and time. Recent and remote memory intact.  Attention span, concentration and fund of knowledge appropriate. Mood and affect appropriate.    Cranial Nerves: Pupils equal, briskly reactive to light. Extraocular movements full without nystagmus. Visual fields full to confrontation. Hearing intact. Facial sensation intact. Face, tongue, palate moves normally and symmetrically.  Motor: Normal bulk and tone. Normal strength in all tested extremity muscles. Sensory.: intact to touch , pinprick , position and vibratory sensation.  Coordination: Rapid alternating movements normal in all extremities. Finger-to-nose and heel-to-shin performed accurately bilaterally. Gait and Station: Arises from chair without difficulty. Stance is normal. Gait demonstrates normal stride length and balance . Able to heel, toe and tandem walk with slight difficulty.  Reflexes: 1+ and symmetric. Toes downgoing.        ASSESSMENT/PLAN: 78 year old Caucasian male with recurrent transient episodes of speech disturbance, automatisms and confusion disorientation likely complex partial seizures on 08/23/2019.  Unremarkable brain imaging but abnormal EEG showing focal left frontotemporal slowing.    1. Partial symptomatic epilepsy with complex partial seizures, not intractable, without status epilepticus (HCC) -Continue Keppra XR 500mg  nightly - refill provided -lab work including CMP and CBC 09/2021 with PCP WNL -EEG 08/24/2019 cortical dysfunction in the left frontotemporal region possibly postictal state -EEG 03/07/2020 normal without evidence of seizures -MR brain 08/24/2019 small remote left basal ganglia infarct, moderate cerebral atrophy and mild chronic microvascular ischemic changes -advised to avoid seizure provoking triggers like abrupt medication discontinuation, medication noncompliance, alcohol, extremes of activity and exertion and abrupt changes  in diet    Follow-up in 1 year via MyChart VV per pt request or call earlier if needed   CC:  Delynn Flavin M, DO   I spent 16 minutes of face-to-face and non-face-to-face time with patient.  This included previsit chart review, lab review, study review, electronic health record documentation, patient education and discussion regarding above diagnoses and treatment plan and answered all the questions to patient's satisfaction  Ihor Austin, [facility]  [facility] [address] [place], [place] [address]  Phone [phone] Fax [phone] Note: This document was prepared with digital dictation and possible smart phrase technology. Any transcriptional errors that result from this process are unintentional.

## 2023-02-20 ENCOUNTER — Telehealth: Payer: Medicare HMO | Admitting: Adult Health

## 2023-02-20 ENCOUNTER — Encounter: Payer: Self-pay | Admitting: Adult Health

## 2023-02-20 DIAGNOSIS — M533 Sacrococcygeal disorders, not elsewhere classified: Secondary | ICD-10-CM | POA: Diagnosis not present

## 2023-02-20 DIAGNOSIS — G40219 Localization-related (focal) (partial) symptomatic epilepsy and epileptic syndromes with complex partial seizures, intractable, without status epilepticus: Secondary | ICD-10-CM | POA: Diagnosis not present

## 2023-02-20 DIAGNOSIS — M62830 Muscle spasm of back: Secondary | ICD-10-CM | POA: Diagnosis not present

## 2023-02-20 DIAGNOSIS — M545 Low back pain, unspecified: Secondary | ICD-10-CM | POA: Diagnosis not present

## 2023-02-20 MED ORDER — LEVETIRACETAM ER 500 MG PO TB24: 500.0000 mg | ORAL_TABLET | Freq: Every day | ORAL | 3 refills | Status: DC

## 2023-02-21 ENCOUNTER — Other Ambulatory Visit: Payer: Self-pay | Admitting: Internal Medicine

## 2023-02-21 DIAGNOSIS — R1310 Dysphagia, unspecified: Secondary | ICD-10-CM

## 2023-02-21 DIAGNOSIS — K219 Gastro-esophageal reflux disease without esophagitis: Secondary | ICD-10-CM

## 2023-02-21 DIAGNOSIS — K209 Esophagitis, unspecified without bleeding: Secondary | ICD-10-CM

## 2023-02-21 DIAGNOSIS — K222 Esophageal obstruction: Secondary | ICD-10-CM

## 2023-02-27 DIAGNOSIS — M533 Sacrococcygeal disorders, not elsewhere classified: Secondary | ICD-10-CM | POA: Diagnosis not present

## 2023-03-18 ENCOUNTER — Other Ambulatory Visit: Payer: Medicare HMO

## 2023-03-27 DIAGNOSIS — M545 Low back pain, unspecified: Secondary | ICD-10-CM | POA: Diagnosis not present

## 2023-03-27 DIAGNOSIS — M792 Neuralgia and neuritis, unspecified: Secondary | ICD-10-CM | POA: Diagnosis not present

## 2023-03-27 DIAGNOSIS — M533 Sacrococcygeal disorders, not elsewhere classified: Secondary | ICD-10-CM | POA: Diagnosis not present

## 2023-03-27 DIAGNOSIS — M62830 Muscle spasm of back: Secondary | ICD-10-CM | POA: Diagnosis not present

## 2023-04-01 ENCOUNTER — Other Ambulatory Visit: Payer: Self-pay | Admitting: Family Medicine

## 2023-04-22 ENCOUNTER — Encounter: Payer: Self-pay | Admitting: Family Medicine

## 2023-04-22 ENCOUNTER — Ambulatory Visit (INDEPENDENT_AMBULATORY_CARE_PROVIDER_SITE_OTHER): Payer: Medicare HMO | Admitting: Family Medicine

## 2023-04-22 VITALS — BP 146/76 | HR 69 | Temp 98.6°F | Ht 69.0 in | Wt 218.0 lb

## 2023-04-22 DIAGNOSIS — N138 Other obstructive and reflux uropathy: Secondary | ICD-10-CM | POA: Diagnosis not present

## 2023-04-22 DIAGNOSIS — J449 Chronic obstructive pulmonary disease, unspecified: Secondary | ICD-10-CM

## 2023-04-22 DIAGNOSIS — Z0000 Encounter for general adult medical examination without abnormal findings: Secondary | ICD-10-CM

## 2023-04-22 DIAGNOSIS — Z87891 Personal history of nicotine dependence: Secondary | ICD-10-CM | POA: Diagnosis not present

## 2023-04-22 DIAGNOSIS — N401 Enlarged prostate with lower urinary tract symptoms: Secondary | ICD-10-CM | POA: Diagnosis not present

## 2023-04-22 DIAGNOSIS — I5032 Chronic diastolic (congestive) heart failure: Secondary | ICD-10-CM | POA: Diagnosis not present

## 2023-04-22 DIAGNOSIS — I10 Essential (primary) hypertension: Secondary | ICD-10-CM

## 2023-04-22 DIAGNOSIS — Z0001 Encounter for general adult medical examination with abnormal findings: Secondary | ICD-10-CM

## 2023-04-22 DIAGNOSIS — G40219 Localization-related (focal) (partial) symptomatic epilepsy and epileptic syndromes with complex partial seizures, intractable, without status epilepticus: Secondary | ICD-10-CM | POA: Diagnosis not present

## 2023-04-22 NOTE — Progress Notes (Unsigned)
 Eric Cook is a 78 y.o. male presents to office today for annual physical exam examination.    Concerns today include: 1. ***  Occupation: ***, Marital status: ***, Substance use: *** Health Maintenance Due  Topic Date Due   Zoster Vaccines- Shingrix (1 of 2) Never done   COVID-19 Vaccine (5 - 2024-25 season) 09/23/2022   Refills needed today: ***  Immunization History  Administered Date(s) Administered   Fluad Quad(high Dose 65+) 10/14/2019, 11/03/2020, 10/18/2021   Moderna Sars-Covid-2 Vaccination 03/18/2019, 04/15/2019, 12/01/2019   PNEUMOCOCCAL CONJUGATE-20 11/03/2020   Pfizer(Comirnaty)Fall Seasonal Vaccine 12 years and older 01/17/2022   Pneumococcal Conjugate-13 10/14/2019   Past Medical History:  Diagnosis Date   Anxiety    HLD (hyperlipidemia)    Hyperlipidemia    Hypertension    Peptic ulcer    Seizures (HCC)    medication related   Social History   Socioeconomic History   Marital status: Single    Spouse name: Not on file   Number of children: 1   Years of education: Not on file   Highest education level: Not on file  Occupational History   Occupation: Lobbyist    Comment: part time  Tobacco Use   Smoking status: Former    Current packs/day: 0.00    Average packs/day: 1 pack/day for 54.0 years (54.0 ttl pk-yrs)    Types: Cigarettes    Start date: 04/1965    Quit date: 04/2019    Years since quitting: 4.0   Smokeless tobacco: Never  Vaping Use   Vaping status: Never Used  Substance and Sexual Activity   Alcohol use: Never   Drug use: Never   Sexual activity: Never  Other Topics Concern   Not on file  Social History Narrative   Lives alone   Right Handed   Drinks 2-3 cups of caffeine daily   Son in [place]   Social Drivers of Health   Financial Resource Strain: Low Risk  (06/22/2022)   Overall Financial Resource Strain (CARDIA)    Difficulty of Paying Living Expenses: Not hard at all  Food Insecurity: No Food  Insecurity (08/03/2022)   Hunger Vital Sign    Worried About Running Out of Food in the Last Year: Never true    Ran Out of Food in the Last Year: Never true  Transportation Needs: No Transportation Needs (08/03/2022)   PRAPARE - Administrator, Civil Service (Medical): No    Lack of Transportation (Non-Medical): No  Physical Activity: Sufficiently Active (06/22/2022)   Exercise Vital Sign    Days of Exercise per Week: 7 days    Minutes of Exercise per Session: 30 min  Stress: No Stress Concern Present (06/22/2022)   [employer] of Occupational Health - Occupational Stress Questionnaire    Feeling of Stress : Not at all  Social Connections: Socially Integrated (06/22/2022)   Social Connection and Isolation Panel [NHANES]    Frequency of Communication with Friends and Family: More than three times a week    Frequency of Social Gatherings with Friends and Family: More than three times a week    Attends Religious Services: More than 4 times per year    Active Member of [employer] or Organizations: Yes    Attends Banker Meetings: More than 4 times per year    Marital Status: Married  Catering manager Violence: Not At Risk (08/03/2022)   Humiliation, Afraid, Rape, and Kick questionnaire    Fear of Current  or Ex-Partner: No    Emotionally Abused: No    Physically Abused: No    Sexually Abused: No   Past Surgical History:  Procedure Laterality Date   LUMBAR DISC SURGERY     L3-L4   UPPER GASTROINTESTINAL ENDOSCOPY     Family History  Problem Relation Age of Onset   Colon cancer Brother    Other Son        vertigo   Kidney Stones Son    Esophageal cancer Neg Hx    Pancreatic cancer Neg Hx    Stomach cancer Neg Hx     Current Outpatient Medications:    acetaminophen (TYLENOL) 500 MG tablet, Take 500 mg by mouth as needed for moderate pain or mild pain (As Needed)., Disp: , Rfl:    cetirizine (ZYRTEC) 10 MG tablet, Take 0.5 tablets (5 mg total) by mouth  daily., Disp: 30 tablet, Rfl: 5   cimetidine (TAGAMET) 400 MG tablet, TAKE 1 TABLET TWICE DAILY AS NEEDED, Disp: 180 tablet, Rfl: 0   dicyclomine (BENTYL) 10 MG capsule, Take 2 capsules (20 mg total) by mouth 3 (three) times daily before meals. TAKE 2 CAPSULES (20 MG) 15 MINUTES BEFORE MEALS AS NEEDED;, Disp: 270 capsule, Rfl: 1   fluticasone (FLONASE) 50 MCG/ACT nasal spray, USE 2 SPRAYS IN EACH NOSTRIL EVERY DAY, Disp: 48 each, Rfl: 3   fluticasone furoate-vilanterol (BREO ELLIPTA) 200-25 MCG/ACT AEPB, Inhale 1 puff into the lungs daily., Disp: 60 each, Rfl: 11   furosemide (LASIX) 20 MG tablet, TAKE 1 TABLET (20 MG TOTAL) BY MOUTH DAILY AS NEEDED FOR FLUID., Disp: 90 tablet, Rfl: 0   levETIRAcetam (KEPPRA XR) 500 MG 24 hr tablet, Take 1 tablet (500 mg total) by mouth at bedtime., Disp: 90 tablet, Rfl: 3   losartan (COZAAR) 25 MG tablet, Take 1 tablet (25 mg total) by mouth daily., Disp: 90 tablet, Rfl: 0   pantoprazole (PROTONIX) 40 MG tablet, Take 1 tablet (40 mg total) by mouth daily., Disp: 90 tablet, Rfl: 3   sertraline (ZOLOFT) 50 MG tablet, TAKE 1 TABLET BY MOUTH EVERY DAY, Disp: 90 tablet, Rfl: 1   simvastatin (ZOCOR) 20 MG tablet, TAKE 1 TABLET EVERY DAY AT 6 PM, Disp: 90 tablet, Rfl: 1   tamsulosin (FLOMAX) 0.4 MG CAPS capsule, TAKE 1 CAPSULE EVERY DAY, Disp: 90 capsule, Rfl: 1   traZODone (DESYREL) 50 MG tablet, TAKE 0.5-1 TABLETS BY MOUTH AT BEDTIME AS NEEDED FOR SLEEP., Disp: 90 tablet, Rfl: 3  No Known Allergies   ROS: Review of Systems {ros; complete:30496}    Physical exam {Exam, Complete:[phone]}      10/19/2022    9:52 AM 10/10/2022    8:44 AM 10/02/2022    1:29 PM  Depression screen PHQ 2/9  Decreased Interest 0 0 0  Down, Depressed, Hopeless 0 0 0  PHQ - 2 Score 0 0 0  Altered sleeping 0 0 2  Tired, decreased energy 0 0 0  Change in appetite 0 0 0  Feeling bad or failure about yourself  0 0 0  Trouble concentrating 0 0 0  Moving slowly or fidgety/restless 0  0 0  Suicidal thoughts 0 0 0  PHQ-9 Score 0 0 2  Difficult doing work/chores Not difficult at all Not difficult at all Not difficult at all      10/19/2022    9:52 AM 10/10/2022    8:44 AM 10/02/2022    1:38 PM 09/14/2022    3:04 PM  GAD  7 : Generalized Anxiety Score  Nervous, Anxious, on Edge 1 1 1 1   Control/stop worrying 1 0 1 1  Worry too much - different things 1 0 1 1  Trouble relaxing 1 0 1 1  Restless 0 0 1 1  Easily annoyed or irritable 0 0 1 1  Afraid - awful might happen 0 0 1 1  Total GAD 7 Score 4 1 7 7   Anxiety Difficulty Somewhat difficult Not difficult at all Somewhat difficult Somewhat difficult     Assessment/ Plan: Genia Hotter here for annual physical exam.   No diagnosis found.  ***  Counseled on healthy lifestyle choices, including diet (rich in fruits, vegetables and lean meats and low in salt and simple carbohydrates) and exercise (at least 30 minutes of moderate physical activity daily).  Patient to follow up ***  Amyiah Gaba M. Nadine Counts, DO

## 2023-04-23 ENCOUNTER — Encounter: Payer: Self-pay | Admitting: Family Medicine

## 2023-04-23 ENCOUNTER — Other Ambulatory Visit: Payer: Self-pay | Admitting: Family Medicine

## 2023-04-23 DIAGNOSIS — K219 Gastro-esophageal reflux disease without esophagitis: Secondary | ICD-10-CM

## 2023-04-23 LAB — CBC WITH DIFFERENTIAL/PLATELET
Basophils Absolute: 0.1 10*3/uL (ref 0.0–0.2)
Basos: 1 %
EOS (ABSOLUTE): 0.1 10*3/uL (ref 0.0–0.4)
Eos: 1 %
Hematocrit: 40.8 % (ref 37.5–51.0)
Hemoglobin: 13.5 g/dL (ref 13.0–17.7)
Immature Grans (Abs): 0 10*3/uL (ref 0.0–0.1)
Immature Granulocytes: 0 %
Lymphocytes Absolute: 1.3 10*3/uL (ref 0.7–3.1)
Lymphs: 19 %
MCH: 33 pg (ref 26.6–33.0)
MCHC: 33.1 g/dL (ref 31.5–35.7)
MCV: 100 fL — ABNORMAL HIGH (ref 79–97)
Monocytes Absolute: 0.8 10*3/uL (ref 0.1–0.9)
Monocytes: 11 %
Neutrophils Absolute: 4.8 10*3/uL (ref 1.4–7.0)
Neutrophils: 68 %
Platelets: 271 10*3/uL (ref 150–450)
RBC: 4.09 x10E6/uL — ABNORMAL LOW (ref 4.14–5.80)
RDW: 11.8 % (ref 11.6–15.4)
WBC: 7.1 10*3/uL (ref 3.4–10.8)

## 2023-04-23 LAB — CMP14+EGFR
ALT: 12 IU/L (ref 0–44)
AST: 23 IU/L (ref 0–40)
Albumin: 4.4 g/dL (ref 3.8–4.8)
Alkaline Phosphatase: 94 IU/L (ref 44–121)
BUN/Creatinine Ratio: 13 (ref 10–24)
BUN: 14 mg/dL (ref 8–27)
Bilirubin Total: 0.6 mg/dL (ref 0.0–1.2)
CO2: 24 mmol/L (ref 20–29)
Calcium: 9.6 mg/dL (ref 8.6–10.2)
Chloride: 103 mmol/L (ref 96–106)
Creatinine, Ser: 1.07 mg/dL (ref 0.76–1.27)
Globulin, Total: 2.1 g/dL (ref 1.5–4.5)
Glucose: 92 mg/dL (ref 70–99)
Potassium: 4.1 mmol/L (ref 3.5–5.2)
Sodium: 140 mmol/L (ref 134–144)
Total Protein: 6.5 g/dL (ref 6.0–8.5)
eGFR: 71 mL/min/{1.73_m2} (ref >59)

## 2023-04-23 LAB — PSA: Prostate Specific Ag, Serum: 0.1 ng/mL (ref 0.0–4.0)

## 2023-04-23 LAB — LIPID PANEL
Chol/HDL Ratio: 5.2 ratio — ABNORMAL HIGH (ref 0.0–5.0)
Cholesterol, Total: 135 mg/dL (ref 100–199)
HDL: 26 mg/dL — ABNORMAL LOW (ref >39)
LDL Chol Calc (NIH): 82 mg/dL (ref 0–99)
Triglycerides: 150 mg/dL — ABNORMAL HIGH (ref 0–149)
VLDL Cholesterol Cal: 27 mg/dL (ref 5–40)

## 2023-04-24 DIAGNOSIS — M62830 Muscle spasm of back: Secondary | ICD-10-CM | POA: Diagnosis not present

## 2023-04-24 DIAGNOSIS — M792 Neuralgia and neuritis, unspecified: Secondary | ICD-10-CM | POA: Diagnosis not present

## 2023-04-24 DIAGNOSIS — M533 Sacrococcygeal disorders, not elsewhere classified: Secondary | ICD-10-CM | POA: Diagnosis not present

## 2023-04-24 DIAGNOSIS — M5459 Other low back pain: Secondary | ICD-10-CM | POA: Diagnosis not present

## 2023-04-24 DIAGNOSIS — M545 Low back pain, unspecified: Secondary | ICD-10-CM | POA: Diagnosis not present

## 2023-05-13 ENCOUNTER — Ambulatory Visit (HOSPITAL_BASED_OUTPATIENT_CLINIC_OR_DEPARTMENT_OTHER)

## 2023-05-13 ENCOUNTER — Ambulatory Visit (INDEPENDENT_AMBULATORY_CARE_PROVIDER_SITE_OTHER): Admitting: Family Medicine

## 2023-05-13 ENCOUNTER — Encounter: Payer: Self-pay | Admitting: Family Medicine

## 2023-05-13 ENCOUNTER — Ambulatory Visit: Payer: Self-pay

## 2023-05-13 VITALS — BP 161/78 | HR 75 | Temp 98.3°F | Ht 69.0 in | Wt 217.0 lb

## 2023-05-13 DIAGNOSIS — R600 Localized edema: Secondary | ICD-10-CM

## 2023-05-13 NOTE — Telephone Encounter (Signed)
 Chief Complaint: bilateral hand swelling Symptoms: bilateral hand swelling, warmth (worse on the right) Frequency: x 1 month Pertinent Negatives: Patient denies redness in hands, chest pain, SOB, hand injury, spreading red streaks Disposition: [] ED /[] Urgent Care (no appt availability in office) / [x] Appointment(In office/virtual)/ []  [employer] [facility]/ [] Home Care/ [] Refused Recommended Disposition /[] [facility]/ []  Follow-up with PCP Additional Notes: Patient having a difficult time answering specific questions regarding severity of swelling or what it looks like. Patient also states he does not have a history of CHF but it is listed in his last visit with his PCP. No available appts with PCP. Patient agreeable to see DOD today.  Copied from CRM [id number]. Topic: Clinical - Red Word Triage >> May 13, 2023  8:17 AM Leory Rands wrote: Red Word that prompted transfer to Nurse Triage: Patient is calling to report that his L & R hands are swollen his last OV. With pain unable to bend hands Reason for Disposition  MODERATE hand swelling (e.g., visible swelling of hand and fingers; pitting edema)  Answer Assessment - Initial Assessment Questions 1. ONSET: "When did the swelling start?" (e.g., minutes, hours, days)     X 1 month.  2. LOCATION: "What part of the hand is swollen?"  "Are both hands swollen or just one hand?"     Left and right. Right is worse. Fingers to middle of hand.  3. SEVERITY: "How bad is the swelling?" (e.g., localized; mild, moderate, severe)   - BALL OR LUMP: small ball or lump   - LOCALIZED: puffy or swollen area or patch of skin   - JOINT SWELLING: swelling of a joint   - MILD: puffiness or mild swelling of fingers or hand   - MODERATE: fingers and hand are swollen   - SEVERE: swelling of entire hand and up into forearm     Mild to moderate.  4. REDNESS: "Does the swelling look red or infected?"     Patient states sometimes there is warmth in the  hands.  5. PAIN: "Is the swelling painful to touch?" If Yes, ask: "How painful is it?"   (Scale 1-10; mild, moderate or severe)     6-7/10 when moving his fingers. He states that he has been taking Tylenol .  6. FEVER: "Do you have a fever?" If Yes, ask: "What is it, how was it measured, and when did it start?"      Denies.  7. CAUSE: "What do you think is causing the hand swelling?" (e.g., heat, insect bite, pregnancy, recent injury)     Unsure.  8. MEDICAL HISTORY: "Do you have a history of heart failure, kidney disease, liver failure, or cancer?"     CHF (from PCP's last OV note); patient denies having CHF.  9. RECURRENT SYMPTOM: "Have you had hand swelling before?" If Yes, ask: "When was the last time?" "What happened that time?"     Yes, he states he has to take a fluid pill once in a while and that usually takes care of it. He states he has been taking a fluid pill for a week, once daily and it wasn't helping.  10. OTHER SYMPTOMS: "Do you have any other symptoms?" (e.g., blurred vision, difficulty breathing, headache)       He states there might be slight swelling in his legs as well but he is not sure.  11. PREGNANCY: "Is there any chance you are pregnant?" "When was your last menstrual period?"  N/A.  Protocols used: Hand Swelling-A-AH

## 2023-05-13 NOTE — Progress Notes (Signed)
 Subjective:  Patient ID: Eric Cook, male    DOB: 1945/07/31  Age: 78 y.o. MRN: [id number]  CC: Edema (Bilateral hand swelling. Started a month ago and getting worse. Affecting grip. Fluid pill not helping, took for one week. Painful when trying to close hands. Started gabapentin around that time so he stopped taking it. )   HPI Eric Cook presents for swelling in the hands primarily on the right.  He cannot make a fist on the right it has made his grip weaker.  The left has swelling but not so much as the right.  He cannot completely make a fist and his grip although not strong is adequate.  He denies any previous injury.  He did not get better when he discontinued the gabapentin.     04/22/2023   10:20 AM 10/19/2022    9:52 AM 10/10/2022    8:44 AM  Depression screen PHQ 2/9  Decreased Interest 0 0 0  Down, Depressed, Hopeless 0 0 0  PHQ - 2 Score 0 0 0  Altered sleeping 0 0 0  Tired, decreased energy 0 0 0  Change in appetite 0 0 0  Feeling bad or failure about yourself  0 0 0  Trouble concentrating 0 0 0  Moving slowly or fidgety/restless 0 0 0  Suicidal thoughts 0 0 0  PHQ-9 Score 0 0 0  Difficult doing work/chores Not difficult at all Not difficult at all Not difficult at all    History Eric Cook has a past medical history of Anxiety, HLD (hyperlipidemia), Hyperlipidemia, Hypertension, Peptic ulcer, and Seizures (HCC).   He has a past surgical history that includes Lumbar disc surgery and Upper gastrointestinal endoscopy.   His family history includes Colon cancer in his brother; Kidney Stones in his son; Other in his son.He reports that he quit smoking about 4 years ago. His smoking use included cigarettes. He started smoking about 58 years ago. He has a 54 pack-year smoking history. He has never used smokeless tobacco. He reports that he does not drink alcohol and does not use drugs.    ROS Review of Systems  Constitutional:  Negative for fever.  Respiratory:   Negative for shortness of breath.   Cardiovascular:  Negative for chest pain.  Musculoskeletal:  Negative for arthralgias.  Skin:  Negative for rash.    Objective:  BP (!) 161/78   Pulse 75   Temp 98.3 F (36.8 C)   Ht 5\' 9"  (1.753 m)   Wt 217 lb (98.4 kg)   SpO2 92%   BMI 32.05 kg/m   BP Readings from Last 3 Encounters:  05/13/23 (!) 161/78  04/22/23 (!) 146/76  02/13/23 120/68    Wt Readings from Last 3 Encounters:  05/13/23 217 lb (98.4 kg)  04/22/23 218 lb (98.9 kg)  02/13/23 224 lb (101.6 kg)     Physical Exam Vitals reviewed.  Constitutional:      Appearance: He is well-developed.  HENT:     Head: Normocephalic and atraumatic.     Right Ear: External ear normal.     Left Ear: External ear normal.     Mouth/Throat:     Pharynx: No oropharyngeal exudate or posterior oropharyngeal erythema.  Eyes:     Pupils: Pupils are equal, round, and reactive to light.  Cardiovascular:     Rate and Rhythm: Normal rate and regular rhythm.     Heart sounds: No murmur heard. Pulmonary:     Effort: No respiratory  distress.     Breath sounds: Normal breath sounds.  Musculoskeletal:        General: Swelling (3+ right hand especially at the fingers.  He cannot completely close the hand to make a fist.  The grip is weak.  There is a bounding 3/4 pulse at the right radial area.  There is equal left radial pulse as well) and tenderness present.     Cervical back: Normal range of motion and neck supple.  Neurological:     Mental Status: He is alert and oriented to person, place, and time.      Assessment & Plan:  Fluid collection (edema) in the arms, legs, hands and feet -     US  Venous Img Upper Bilat (DVT); Future -     US  Venous Img Upper Bilat (DVT); Future -     DOPPLER VENOUS ARMS BILATERAL; Future  Consider Ortho referral if the DVT evaluation is negative   Follow-up: Return if symptoms worsen or fail to improve.  Roise Cleaver, M.D.

## 2023-05-18 DIAGNOSIS — M47896 Other spondylosis, lumbar region: Secondary | ICD-10-CM | POA: Diagnosis not present

## 2023-05-29 ENCOUNTER — Other Ambulatory Visit: Payer: Self-pay | Admitting: Family Medicine

## 2023-05-29 DIAGNOSIS — M5416 Radiculopathy, lumbar region: Secondary | ICD-10-CM | POA: Diagnosis not present

## 2023-05-29 DIAGNOSIS — M79641 Pain in right hand: Secondary | ICD-10-CM | POA: Diagnosis not present

## 2023-05-29 DIAGNOSIS — E782 Mixed hyperlipidemia: Secondary | ICD-10-CM

## 2023-06-04 ENCOUNTER — Other Ambulatory Visit: Payer: Self-pay | Admitting: Family Medicine

## 2023-06-04 DIAGNOSIS — R4589 Other symptoms and signs involving emotional state: Secondary | ICD-10-CM

## 2023-06-04 DIAGNOSIS — F411 Generalized anxiety disorder: Secondary | ICD-10-CM

## 2023-06-18 DIAGNOSIS — M5416 Radiculopathy, lumbar region: Secondary | ICD-10-CM | POA: Diagnosis not present

## 2023-06-21 DIAGNOSIS — M25641 Stiffness of right hand, not elsewhere classified: Secondary | ICD-10-CM | POA: Diagnosis not present

## 2023-06-21 DIAGNOSIS — M25642 Stiffness of left hand, not elsewhere classified: Secondary | ICD-10-CM | POA: Diagnosis not present

## 2023-06-21 DIAGNOSIS — M79642 Pain in left hand: Secondary | ICD-10-CM | POA: Diagnosis not present

## 2023-06-21 DIAGNOSIS — M79641 Pain in right hand: Secondary | ICD-10-CM | POA: Diagnosis not present

## 2023-06-24 ENCOUNTER — Ambulatory Visit (INDEPENDENT_AMBULATORY_CARE_PROVIDER_SITE_OTHER): Payer: Medicare HMO

## 2023-06-24 VITALS — BP 167/78 | HR 75 | Ht 69.0 in | Wt 217.0 lb

## 2023-06-24 DIAGNOSIS — Z0000 Encounter for general adult medical examination without abnormal findings: Secondary | ICD-10-CM | POA: Diagnosis not present

## 2023-06-24 NOTE — Patient Instructions (Signed)
 Eric Cook , Thank you for taking time out of your busy schedule to complete your Annual Wellness Visit with me. I enjoyed our conversation and look forward to speaking with you again next year. I, as well as your care team,  appreciate your ongoing commitment to your health goals. Please review the following plan we discussed and let me know if I can assist you in the future. Your Game plan/ To Do List    Follow up Visits: Next Medicare AWV with our clinical staff: 06/24/24 at 9:20a.m.   Next Office Visit with your provider: 07/01/23 at 10:45a.m.  Clinician Recommendations:  Aim for 30 minutes of exercise or brisk walking, 6-8 glasses of water, and 5 servings of fruits and vegetables each day.       This is a list of the screening recommended for you and due dates:  Health Maintenance  Topic Date Due   Zoster (Shingles) Vaccine (1 of 2) 07/22/2023*   DTaP/Tdap/Td vaccine (1 - Tdap) 04/21/2024*   COVID-19 Vaccine (5 - 2024-25 season) 07/09/2024*   Screening for Lung Cancer  08/03/2023   Flu Shot  08/23/2023   Medicare Annual Wellness Visit  06/23/2024   Pneumonia Vaccine  Completed   Hepatitis C Screening  Completed   HPV Vaccine  Aged Out   Meningitis B Vaccine  Aged Out  *Topic was postponed. The date shown is not the original due date.    Advanced directives: (Declined) Advance directive discussed with you today. Even though you declined this today, please call our office should you change your mind, and we can give you the proper paperwork for you to fill out. Advance Care Planning is important because it:  [x]  Makes sure you receive the medical care that is consistent with your values, goals, and preferences  [x]  It provides guidance to your family and loved ones and reduces their decisional burden about whether or not they are making the right decisions based on your wishes.  Follow the link provided in your after visit summary or read over the paperwork we have mailed to you to  help you started getting your Advance Directives in place. If you need assistance in completing these, please reach out to us  so that we can help you!  See attachments for Preventive Care and Fall Prevention Tips.

## 2023-06-24 NOTE — Progress Notes (Signed)
 Subjective:   Eric Cook is a 78 y.o. who presents for a Medicare Wellness preventive visit.  As a reminder, Annual Wellness Visits don't include a physical exam, and some assessments may be limited, especially if this visit is performed virtually. We may recommend an in-person follow-up visit with your provider if needed.  Visit Complete: Virtual I connected with  Eric Cook on 06/24/23 by a audio enabled telemedicine application and verified that I am speaking with the correct person using two identifiers.  Patient Location: Home  Provider Location: Home Office  I discussed the limitations of evaluation and management by telemedicine. The patient expressed understanding and agreed to proceed.  Vital Signs: Because this visit was a virtual/telehealth visit, some criteria may be missing or patient reported. Any vitals not documented were not able to be obtained and vitals that have been documented are patient reported.  VideoDeclined- This patient declined Librarian, academic. Therefore the visit was completed with audio only.  Persons Participating in Visit: Patient.  AWV Questionnaire: No: Patient Medicare AWV questionnaire was not completed prior to this visit.  Cardiac Risk Factors include: advanced age (>13men, >75 women);male gender;diabetes mellitus;dyslipidemia;hypertension;obesity (BMI >30kg/m2)     Objective:     Today's Vitals   06/24/23 0946  BP: (!) 167/78  Pulse: 75  Weight: 217 lb (98.4 kg)  Height: 5\' 9"  (1.753 m)   Body mass index is 32.05 kg/m.     06/24/2023    9:34 AM 08/03/2022    6:12 PM 08/03/2022    6:03 PM 06/22/2022    9:52 AM 12/25/2021    1:21 PM 06/20/2021   10:49 AM 04/13/2021    7:50 AM  Advanced Directives  Does Patient Have a Medical Advance Directive? Yes  No No Yes Yes No  Type of Estate agent of [place];Living will     Healthcare Power of [place];Living will   Copy of  Healthcare Power of Attorney in Chart? No - copy requested     No - copy requested   Would patient like information on creating a medical advance directive?  No - Patient declined  No - Patient declined       Current Medications (verified) Outpatient Encounter Medications as of 06/24/2023  Medication Sig   acetaminophen  (TYLENOL ) 500 MG tablet Take 500 mg by mouth as needed for moderate pain or mild pain (As Needed).   cetirizine  (ZYRTEC ) 10 MG tablet Take 0.5 tablets (5 mg total) by mouth daily.   cimetidine  (TAGAMET ) 400 MG tablet TAKE 1 TABLET TWICE DAILY AS NEEDED   dicyclomine  (BENTYL ) 10 MG capsule Take 2 capsules (20 mg total) by mouth 3 (three) times daily before meals. TAKE 2 CAPSULES (20 MG) 15 MINUTES BEFORE MEALS AS NEEDED;   fluticasone  (FLONASE ) 50 MCG/ACT nasal spray USE 2 SPRAYS IN EACH NOSTRIL EVERY DAY   fluticasone  furoate-vilanterol (BREO ELLIPTA ) 200-25 MCG/ACT AEPB Inhale 1 puff into the lungs daily.   furosemide  (LASIX ) 20 MG tablet TAKE 1 TABLET (20 MG TOTAL) BY MOUTH DAILY AS NEEDED FOR FLUID.   gabapentin (NEURONTIN) 300 MG capsule Take 300 mg by mouth 2 (two) times daily.   levETIRAcetam  (KEPPRA  XR) 500 MG 24 hr tablet Take 1 tablet (500 mg total) by mouth at bedtime.   losartan  (COZAAR ) 25 MG tablet Take 1 tablet (25 mg total) by mouth daily.   pantoprazole  (PROTONIX ) 40 MG tablet Take 1 tablet (40 mg total) by mouth daily.   sertraline  (  ZOLOFT ) 50 MG tablet TAKE 1 TABLET BY MOUTH EVERY DAY   simvastatin  (ZOCOR ) 20 MG tablet TAKE 1 TABLET EVERY DAY AT 6 PM   tamsulosin  (FLOMAX ) 0.4 MG CAPS capsule TAKE 1 CAPSULE EVERY DAY   traZODone  (DESYREL ) 50 MG tablet TAKE 0.5-1 TABLETS BY MOUTH AT BEDTIME AS NEEDED FOR SLEEP.   No facility-administered encounter medications on file as of 06/24/2023.    Allergies (verified) Patient has no known allergies.   History: Past Medical History:  Diagnosis Date   Anxiety    HLD (hyperlipidemia)    Hyperlipidemia    Hypertension     Peptic ulcer    Seizures (HCC)    medication related   Past Surgical History:  Procedure Laterality Date   LUMBAR DISC SURGERY     L3-L4   UPPER GASTROINTESTINAL ENDOSCOPY     Family History  Problem Relation Age of Onset   Colon cancer Brother    Other Son        vertigo   Kidney Stones Son    Esophageal cancer Neg Hx    Pancreatic cancer Neg Hx    Stomach cancer Neg Hx    Social History   Socioeconomic History   Marital status: Single    Spouse name: Not on file   Number of children: 1   Years of education: Not on file   Highest education level: Not on file  Occupational History   Occupation: Lobbyist    Comment: part time  Tobacco Use   Smoking status: Former    Current packs/day: 0.00    Average packs/day: 1 pack/day for 54.0 years (54.0 ttl pk-yrs)    Types: Cigarettes    Start date: 04/1965    Quit date: 04/2019    Years since quitting: 4.1   Smokeless tobacco: Never  Vaping Use   Vaping status: Never Used  Substance and Sexual Activity   Alcohol use: Never   Drug use: Never   Sexual activity: Never  Other Topics Concern   Not on file  Social History Narrative   Lives alone   Right Handed   Drinks 2-3 cups of caffeine daily   Son in [place]   Social Drivers of Health   Financial Resource Strain: Low Risk  (06/24/2023)   Overall Financial Resource Strain (CARDIA)    Difficulty of Paying Living Expenses: Not hard at all  Food Insecurity: No Food Insecurity (06/24/2023)   Hunger Vital Sign    Worried About Running Out of Food in the Last Year: Never true    Ran Out of Food in the Last Year: Never true  Transportation Needs: No Transportation Needs (06/24/2023)   PRAPARE - Administrator, Civil Service (Medical): No    Lack of Transportation (Non-Medical): No  Physical Activity: Unknown (06/24/2023)   Exercise Vital Sign    Days of Exercise per Week: Patient declined    Minutes of Exercise per Session: Not on file   Stress: No Stress Concern Present (06/24/2023)   [employer] of Occupational Health - Occupational Stress Questionnaire    Feeling of Stress : Not at all  Social Connections: Socially Isolated (06/24/2023)   Social Connection and Isolation Panel [NHANES]    Frequency of Communication with Friends and Family: More than three times a week    Frequency of Social Gatherings with Friends and Family: More than three times a week    Attends Religious Services: Never    Production manager of [employer]  or Organizations: No    Attends Banker Meetings: Never    Marital Status: Divorced    Tobacco Counseling Counseling given: Yes    Clinical Intake:  Pre-visit preparation completed: Yes  Pain : No/denies pain     BMI - recorded: 32.05 Nutritional Status: BMI > 30  Obese Nutritional Risks: None Diabetes: No  No results found for: "HGBA1C"   How often do you need to have someone help you when you read instructions, pamphlets, or other written materials from your doctor or pharmacy?: 1 - Never  Interpreter Needed?: No  Information entered by :: Alia t/cma   Activities of Daily Living     06/24/2023    9:28 AM 08/03/2022    6:03 PM  In your present state of health, do you have any difficulty performing the following activities:  Hearing? 0 0  Vision? 0 0  Difficulty concentrating or making decisions? 0 0  Walking or climbing stairs? 0 0  Dressing or bathing? 0 0  Doing errands, shopping? 0 0  Preparing Food and eating ? N   Using the Toilet? N   In the past six months, have you accidently leaked urine? Y   Do you have problems with loss of bowel control? Y   Managing your Medications? N   Managing your Finances? N   Housekeeping or managing your Housekeeping? N     Patient Care Team: Eliodoro Guerin, DO as PCP - General (Family Medicine) O'Neal, Cathay Clonts, MD as PCP - Cardiology (Cardiology) Brian Campanile, MD (Inactive) as Consulting Physician  (Neurology) Andrez Banker, MD as Attending Physician (Urology) Tobin Forts, MD as Consulting Physician (Gastroenterology) Johny Nap, NP as Nurse Practitioner (Neurology) Hunsucker, Archer Kobs, MD as Consulting Physician (Pulmonary Disease) Regina Capes as Counselor (Professional Counselor)  I have updated your Care Teams any recent Medical Services you may have received from other providers in the past year.     Assessment:    This is a routine wellness examination for Eric Cook.  Hearing/Vision screen Hearing Screening - Comments:: Pt denies hearing dif Vision Screening - Comments::  Pt goes [facility] suggest getting update for eye exam, have not had one in the last 63yrs   Goals Addressed             This Visit's Progress    Exercise 3x per week (30 min per time)   On track    He wants to stay active and healthy; Get appetite back (has lost smell and taste since hospital visit in 08/2019 - possible seizure) 06/20/2021 - this is some better - he still has upset stomach and fecal urgency     Patient Stated       Would like to travel more       Depression Screen     06/24/2023    9:48 AM 04/22/2023   10:20 AM 10/19/2022    9:52 AM 10/10/2022    8:44 AM 10/02/2022    1:29 PM 09/14/2022    3:07 PM 09/03/2022    2:40 PM  PHQ 2/9 Scores  PHQ - 2 Score 0 0 0 0 0 0 4  PHQ- 9 Score  0 0 0 2 0 4    Fall Risk     06/24/2023    9:36 AM 04/22/2023   10:20 AM 10/19/2022    9:52 AM 10/10/2022    8:45 AM 10/02/2022    1:38 PM  Fall  Risk   Falls in the past year? 0 0 0 0 0  Number falls in past yr: 0 0 0 0 0  Injury with Fall? 0 0 0 0 0  Risk for fall due to : No Fall Risks No Fall Risks No Fall Risks No Fall Risks No Fall Risks  Follow up Falls evaluation completed Falls evaluation completed Education provided Falls evaluation completed Falls evaluation completed    MEDICARE RISK AT HOME:  Medicare Risk at Home Any stairs in or around the home?: Yes If  so, are there any without handrails?: Yes Home free of loose throw rugs in walkways, pet beds, electrical cords, etc?: Yes Adequate lighting in your home to reduce risk of falls?: Yes Life alert?: No Use of a cane, walker or w/c?: No Grab bars in the bathroom?: No Shower chair or bench in shower?: No Elevated toilet seat or a handicapped toilet?: No  TIMED UP AND GO:  Was the test performed?  no  Cognitive Function: 6CIT completed        06/24/2023    9:39 AM 06/22/2022    9:49 AM 06/20/2021   10:50 AM 06/16/2020   12:03 PM  6CIT Screen  What Year? 0 points 0 points 0 points 0 points  What month? 0 points 0 points 0 points 0 points  What time? 0 points 0 points 0 points 0 points  Count back from 20 0 points 0 points 0 points 0 points  Months in reverse 0 points 0 points 2 points 2 points  Repeat phrase 0 points 0 points 4 points 2 points  Total Score 0 points 0 points 6 points 4 points    Immunizations Immunization History  Administered Date(s) Administered   Fluad Quad(high Dose 65+) 10/14/2019, 11/03/2020, 10/18/2021   Moderna Sars-Covid-2 Vaccination 03/18/2019, 04/15/2019, 12/01/2019   PNEUMOCOCCAL CONJUGATE-20 11/03/2020   Pfizer(Comirnaty)Fall Seasonal Vaccine 12 years and older 01/17/2022   Pneumococcal Conjugate-13 10/14/2019    Screening Tests Health Maintenance  Topic Date Due   Zoster Vaccines- Shingrix (1 of 2) 07/22/2023 (Originally 03/11/1996)   DTaP/Tdap/Td (1 - Tdap) 04/21/2024 (Originally 03/11/1965)   COVID-19 Vaccine (5 - 2024-25 season) 07/09/2024 (Originally 09/23/2022)   Lung Cancer Screening  08/03/2023   INFLUENZA VACCINE  08/23/2023   Medicare Annual Wellness (AWV)  06/23/2024   Pneumonia Vaccine 55+ Years old  Completed   Hepatitis C Screening  Completed   HPV VACCINES  Aged Out   Meningococcal B Vaccine  Aged Out    Health Maintenance  There are no preventive care reminders to display for this patient.  Health Maintenance Items  Addressed: See Nurse Notes at the end of this note  Additional Screening:  Vision Screening: Recommended annual ophthalmology exams for early detection of glaucoma and other disorders of the eye. Would you like a referral to an eye doctor? No    Dental Screening: Recommended annual dental exams for proper oral hygiene  Community Resource Referral / Chronic Care Management: CRR required this visit?  No   CCM required this visit?  No   Plan:    I have personally reviewed and noted the following in the patient's chart:   Medical and social history Use of alcohol, tobacco or illicit drugs  Current medications and supplements including opioid prescriptions. Patient is not currently taking opioid prescriptions. Functional ability and status Nutritional status Physical activity Advanced directives List of other physicians Hospitalizations, surgeries, and ER visits in previous 12 months Vitals Screenings to include cognitive,  depression, and falls Referrals and appointments  In addition, I have reviewed and discussed with patient certain preventive protocols, quality metrics, and best practice recommendations. A written personalized care plan for preventive services as well as general preventive health recommendations were provided to patient.   Michaelle Adolphus, CMA   06/24/2023   After Visit Summary: (MyChart) Due to this being a telephonic visit, the after visit summary with patients personalized plan was offered to patient via MyChart   Notes: Nothing significant to report at this time.

## 2023-07-01 ENCOUNTER — Ambulatory Visit (INDEPENDENT_AMBULATORY_CARE_PROVIDER_SITE_OTHER): Admitting: Family Medicine

## 2023-07-01 VITALS — BP 152/70 | HR 80 | Temp 98.4°F | Ht 69.0 in | Wt 215.0 lb

## 2023-07-01 DIAGNOSIS — I10 Essential (primary) hypertension: Secondary | ICD-10-CM | POA: Diagnosis not present

## 2023-07-01 DIAGNOSIS — R140 Abdominal distension (gaseous): Secondary | ICD-10-CM | POA: Diagnosis not present

## 2023-07-01 DIAGNOSIS — R197 Diarrhea, unspecified: Secondary | ICD-10-CM | POA: Diagnosis not present

## 2023-07-01 DIAGNOSIS — R110 Nausea: Secondary | ICD-10-CM | POA: Diagnosis not present

## 2023-07-01 MED ORDER — ONDANSETRON 4 MG PO TBDP
4.0000 mg | ORAL_TABLET | Freq: Once | ORAL | Status: AC
  Administered 2023-07-01: 4 mg via ORAL

## 2023-07-01 MED ORDER — ONDANSETRON 4 MG PO TBDP
4.0000 mg | ORAL_TABLET | Freq: Three times a day (TID) | ORAL | 0 refills | Status: DC | PRN
Start: 2023-07-01 — End: 2023-10-06

## 2023-07-01 MED ORDER — LOSARTAN POTASSIUM 50 MG PO TABS: 50.0000 mg | ORAL_TABLET | Freq: Every day | ORAL | 3 refills | Status: DC

## 2023-07-01 NOTE — Progress Notes (Signed)
 Subjective: CC: Follow-up hypertension PCP: Eliodoro Guerin, DO Eric Cook is a 78 y.o. male presenting to clinic today for:  1.  Hypertension Patient was seen on 05/13/2023 and noted have elevated blood pressures.  He has been checking them at home and they have been running anywhere between 140 and 166 systolic.  He denies any chest pain or shortness of breath but does report 3-week history of nausea when he wakes up in the morning, bloating of the abdomen and loose stools.  He denies any blood in stool.  He does report that sometimes he wakes up drenched.  He has been compliant with his pantoprazole  and Tagamet .  He reached out to his gastroenterologist but was told that he could not be seen for a couple of months   ROS: Per HPI  No Known Allergies Past Medical History:  Diagnosis Date   Anxiety    HLD (hyperlipidemia)    Hyperlipidemia    Hypertension    Peptic ulcer    Seizures (HCC)    medication related    Current Outpatient Medications:    acetaminophen  (TYLENOL ) 500 MG tablet, Take 500 mg by mouth as needed for moderate pain or mild pain (As Needed)., Disp: , Rfl:    cetirizine  (ZYRTEC ) 10 MG tablet, Take 0.5 tablets (5 mg total) by mouth daily., Disp: 30 tablet, Rfl: 5   cimetidine  (TAGAMET ) 400 MG tablet, TAKE 1 TABLET TWICE DAILY AS NEEDED, Disp: 180 tablet, Rfl: 0   dicyclomine  (BENTYL ) 10 MG capsule, Take 2 capsules (20 mg total) by mouth 3 (three) times daily before meals. TAKE 2 CAPSULES (20 MG) 15 MINUTES BEFORE MEALS AS NEEDED;, Disp: 270 capsule, Rfl: 1   fluticasone  (FLONASE ) 50 MCG/ACT nasal spray, USE 2 SPRAYS IN EACH NOSTRIL EVERY DAY, Disp: 48 each, Rfl: 3   fluticasone  furoate-vilanterol (BREO ELLIPTA ) 200-25 MCG/ACT AEPB, Inhale 1 puff into the lungs daily., Disp: 60 each, Rfl: 11   furosemide  (LASIX ) 20 MG tablet, TAKE 1 TABLET (20 MG TOTAL) BY MOUTH DAILY AS NEEDED FOR FLUID., Disp: 90 tablet, Rfl: 0   gabapentin (NEURONTIN) 300 MG capsule,  Take 300 mg by mouth 2 (two) times daily., Disp: , Rfl:    levETIRAcetam  (KEPPRA  XR) 500 MG 24 hr tablet, Take 1 tablet (500 mg total) by mouth at bedtime., Disp: 90 tablet, Rfl: 3   losartan  (COZAAR ) 25 MG tablet, Take 1 tablet (25 mg total) by mouth daily., Disp: 90 tablet, Rfl: 0   pantoprazole  (PROTONIX ) 40 MG tablet, Take 1 tablet (40 mg total) by mouth daily., Disp: 90 tablet, Rfl: 3   sertraline  (ZOLOFT ) 50 MG tablet, TAKE 1 TABLET BY MOUTH EVERY DAY, Disp: 90 tablet, Rfl: 2   simvastatin  (ZOCOR ) 20 MG tablet, TAKE 1 TABLET EVERY DAY AT 6 PM, Disp: 90 tablet, Rfl: 0   tamsulosin  (FLOMAX ) 0.4 MG CAPS capsule, TAKE 1 CAPSULE EVERY DAY, Disp: 90 capsule, Rfl: 1   traZODone  (DESYREL ) 50 MG tablet, TAKE 0.5-1 TABLETS BY MOUTH AT BEDTIME AS NEEDED FOR SLEEP., Disp: 90 tablet, Rfl: 3 Social History   Socioeconomic History   Marital status: Single    Spouse name: Not on file   Number of children: 1   Years of education: Not on file   Highest education level: Not on file  Occupational History   Occupation: Lobbyist    Comment: part time  Tobacco Use   Smoking status: Former    Current packs/day: 0.00  Average packs/day: 1 pack/day for 54.0 years (54.0 ttl pk-yrs)    Types: Cigarettes    Start date: 04/1965    Quit date: 04/2019    Years since quitting: 4.1   Smokeless tobacco: Never  Vaping Use   Vaping status: Never Used  Substance and Sexual Activity   Alcohol use: Never   Drug use: Never   Sexual activity: Never  Other Topics Concern   Not on file  Social History Narrative   Lives alone   Right Handed   Drinks 2-3 cups of caffeine daily   Son in [place]   Social Drivers of Health   Financial Resource Strain: Low Risk  (06/24/2023)   Overall Financial Resource Strain (CARDIA)    Difficulty of Paying Living Expenses: Not hard at all  Food Insecurity: No Food Insecurity (06/24/2023)   Hunger Vital Sign    Worried About Running Out of Food in the Last  Year: Never true    Ran Out of Food in the Last Year: Never true  Transportation Needs: No Transportation Needs (06/24/2023)   PRAPARE - Administrator, Civil Service (Medical): No    Lack of Transportation (Non-Medical): No  Physical Activity: Unknown (06/24/2023)   Exercise Vital Sign    Days of Exercise per Week: Patient declined    Minutes of Exercise per Session: Not on file  Stress: No Stress Concern Present (06/24/2023)   [employer] of Occupational Health - Occupational Stress Questionnaire    Feeling of Stress : Not at all  Social Connections: Socially Isolated (06/24/2023)   Social Connection and Isolation Panel [NHANES]    Frequency of Communication with Friends and Family: More than three times a week    Frequency of Social Gatherings with Friends and Family: More than three times a week    Attends Religious Services: Never    Database administrator or Organizations: No    Attends Banker Meetings: Never    Marital Status: Divorced  Catering manager Violence: Not At Risk (06/24/2023)   Humiliation, Afraid, Rape, and Kick questionnaire    Fear of Current or Ex-Partner: No    Emotionally Abused: No    Physically Abused: No    Sexually Abused: No   Family History  Problem Relation Age of Onset   Colon cancer Brother    Other Son        vertigo   Kidney Stones Son    Esophageal cancer Neg Hx    Pancreatic cancer Neg Hx    Stomach cancer Neg Hx     Objective: Office vital signs reviewed. BP (!) 152/70   Pulse 80   Temp 98.4 F (36.9 C)   Ht 5\' 9"  (1.753 m)   Wt 215 lb (97.5 kg)   SpO2 93%   BMI 31.75 kg/m   Physical Examination:  General: Awake, alert, obese, nontoxic male, No acute distress HEENT: Sclera white.  Moist mucous membranes Cardio: regular rate and rhythm, S1S2 heard, no murmurs appreciated Pulm: clear to auscultation bilaterally, no wheezes, rhonchi or rales; normal work of breathing on room air GI: soft, protuberant,  generalized tenderness to palpation but some increased tenderness palpation specifically in the epigastric area.  No palpable masses but again he seems somewhat distended.  No rebound or guarding.  Assessment/ Plan: 78 y.o. male   Essential hypertension - Plan: losartan  (COZAAR ) 50 MG tablet, CMP14+EGFR  Abdominal bloating - Plan: Lipase, CBC with Differential, H. pylori antigen, stool  Nausea -  Plan: Lipase, CBC with Differential, ondansetron  (ZOFRAN -ODT) 4 MG disintegrating tablet, H. pylori antigen, stool, ondansetron  (ZOFRAN -ODT) disintegrating tablet 4 mg  Diarrhea, unspecified type - Plan: Lipase, CBC with Differential, Cdiff NAA+O+P+Stool Culture, Fecal occult blood, imunochemical([facility]), H. pylori antigen, stool  Blood pressure not at goal.  Advance losartan  to 50 mg daily.  Plan for recheck of BP with nurse in 2 weeks.  Check renal function, liver enzymes given abdominal bloating.  I have also added lipase, CBC and H. pylori testing.  Would like him to have stool studies done including FOBT.  Differential diagnosis with regards to the abdominal issues include peptic ulcer versus gastritis versus pancreatitis.  Nausea medicine was given by prescription and as a singular dose here in office to help alleviate some of his symptoms.  No evidence of dehydration on exam   Eliodoro Guerin, DO [facility] [phone]

## 2023-07-01 NOTE — Patient Instructions (Addendum)
 Losartan  increased to 50mg  daily since blood pressure remains high. Zofran  sent for as needed use for nausea while we try and figure out what is going on with your belly Focus on bland foods for now Gastritis, Adult Gastritis is irritation and swelling (inflammation) of the stomach. There are two kinds of gastritis: Acute gastritis. This kind develops quickly. Chronic gastritis. This kind is much more common. It develops slowly and lasts for a long time. It is important to get help for this condition. If you do not get help, your stomach can bleed, and you can get sores (ulcers) in your stomach. What are the causes? This condition may be caused by: Germs that get to your stomach and cause an infection. Drinking too much alcohol. Medicines you are taking. Having too much acid in the stomach. Having a disease of the stomach. Other causes may include: An allergic reaction. Some cancer treatments (radiation). Smoking cigarettes or using products that contain nicotine or tobacco. In some cases, the cause of this condition is not known. What increases the risk? Having a disease of the intestines. Having Crohn's disease. Using aspirin or ibuprofen  and other NSAIDs to treat other conditions. Stress. What are the signs or symptoms? Pain in your stomach. A burning feeling in your stomach. Feeling like you may vomit (nauseous). Vomiting or vomiting blood. Feeling too full after you eat. Weight loss. Bad breath. Blood in your poop (stool). In some cases, there are no symptoms. How is this treated? This condition is treated with medicines. The medicines that are used depend on what caused the condition. You may be given: Antibiotic medicine, if your condition was caused by an infection from germs. H2 blockers and similar medicines, if your condition was caused by too much acid in the stomach. Treatment may also include stopping the use of certain medicines, such as aspirin or  ibuprofen . Follow these instructions at home: Medicines Take over-the-counter and prescription medicines only as told by your doctor. If you were prescribed an antibiotic medicine, take it as told by your doctor. Do not stop taking it even if you start to feel better. Alcohol use Do not drink alcohol if: Your doctor tells you not to drink. You are pregnant, may be pregnant, or are planning to become pregnant. If you drink alcohol: Limit your use to: 0-1 drink a day for women. 0-2 drinks a day for men. Know how much alcohol is in your drink. In the [place] one drink equals one 12 oz bottle of beer (355 mL), one 5 oz glass of wine (148 mL), or one 1 oz glass of hard liquor (44 mL). General instructions  Eat small meals often, instead of large meals. Avoid foods and drinks that make you feel worse. Drink enough fluid to keep your pee (urine) pale yellow. Talk with your doctor about ways to manage stress. You can exercise or do deep breathing, meditation, or yoga. Do not smoke or use any products that contain nicotine or tobacco. If you need help quitting, ask your doctor. Keep all follow-up visits. Contact a doctor if: Your symptoms get worse. Your stomach pain gets worse. Your symptoms go away and then come back. You have a fever. Get help right away if: You vomit blood or something that looks like coffee grounds. You have black or dark red poop. You throw up any time you try to drink fluids. These symptoms may be an emergency. Get help right away. Call your local emergency services (911 in the [place] Do  not wait to see if the symptoms will go away. Do not drive yourself to the hospital. Summary Gastritis is irritation and swelling (inflammation) of the stomach. You must get help for this condition. If you do not get help, your stomach can bleed, and you can get sores (ulcers) in your stomach. You can be treated with medicines for germs or medicines to block too much acid in your  stomach. This information is not intended to replace advice given to you by your health care provider. Make sure you discuss any questions you have with your health care provider. Document Revised: 05/14/2020 Document Reviewed: 05/14/2020 Elsevier Patient Education  2024 [employer].

## 2023-07-02 ENCOUNTER — Ambulatory Visit (HOSPITAL_COMMUNITY)
Admission: RE | Admit: 2023-07-02 | Discharge: 2023-07-02 | Disposition: A | Discharge status: Home / Self Care | Source: Ambulatory Visit | Attending: Family Medicine | Admitting: Family Medicine

## 2023-07-02 ENCOUNTER — Ambulatory Visit: Payer: Self-pay | Admitting: Family Medicine

## 2023-07-02 ENCOUNTER — Encounter (HOSPITAL_COMMUNITY): Payer: Self-pay

## 2023-07-02 DIAGNOSIS — R140 Abdominal distension (gaseous): Secondary | ICD-10-CM

## 2023-07-02 DIAGNOSIS — R197 Diarrhea, unspecified: Secondary | ICD-10-CM

## 2023-07-02 DIAGNOSIS — D72829 Elevated white blood cell count, unspecified: Secondary | ICD-10-CM

## 2023-07-02 DIAGNOSIS — R110 Nausea: Secondary | ICD-10-CM

## 2023-07-02 LAB — CBC WITH DIFFERENTIAL/PLATELET
Basophils Absolute: 0 10*3/uL (ref 0.0–0.2)
Basos: 0 %
EOS (ABSOLUTE): 0.2 10*3/uL (ref 0.0–0.4)
Eos: 2 %
Hematocrit: 41.4 % (ref 37.5–51.0)
Hemoglobin: 13.3 g/dL (ref 13.0–17.7)
Immature Grans (Abs): 0.1 10*3/uL (ref 0.0–0.1)
Immature Granulocytes: 1 %
Lymphocytes Absolute: 1.3 10*3/uL (ref 0.7–3.1)
Lymphs: 10 %
MCH: 31.2 pg (ref 26.6–33.0)
MCHC: 32.1 g/dL (ref 31.5–35.7)
MCV: 97 fL (ref 79–97)
Monocytes Absolute: 1 10*3/uL — ABNORMAL HIGH (ref 0.1–0.9)
Monocytes: 8 %
Neutrophils Absolute: 9.5 10*3/uL — ABNORMAL HIGH (ref 1.4–7.0)
Neutrophils: 79 %
Platelets: 229 10*3/uL (ref 150–450)
RBC: 4.26 x10E6/uL (ref 4.14–5.80)
RDW: 13.7 % (ref 11.6–15.4)
WBC: 12 10*3/uL — ABNORMAL HIGH (ref 3.4–10.8)

## 2023-07-02 LAB — CMP14+EGFR
ALT: 20 IU/L (ref 0–44)
AST: 24 IU/L (ref 0–40)
Albumin: 4.4 g/dL (ref 3.8–4.8)
Alkaline Phosphatase: 102 IU/L (ref 44–121)
BUN/Creatinine Ratio: 19 (ref 10–24)
BUN: 15 mg/dL (ref 8–27)
Bilirubin Total: 0.9 mg/dL (ref 0.0–1.2)
CO2: 18 mmol/L — ABNORMAL LOW (ref 20–29)
Calcium: 9.2 mg/dL (ref 8.6–10.2)
Chloride: 105 mmol/L (ref 96–106)
Creatinine, Ser: 0.81 mg/dL (ref 0.76–1.27)
Globulin, Total: 2 g/dL (ref 1.5–4.5)
Glucose: 97 mg/dL (ref 70–99)
Potassium: 3.8 mmol/L (ref 3.5–5.2)
Sodium: 141 mmol/L (ref 134–144)
Total Protein: 6.4 g/dL (ref 6.0–8.5)
eGFR: 91 mL/min/{1.73_m2} (ref >59)

## 2023-07-02 LAB — LIPASE: Lipase: 14 U/L (ref 13–78)

## 2023-07-02 NOTE — Progress Notes (Signed)
 Pt arrived to [facility] for his outpatient CT Abd/pelvis at 09:58. Pt refused to drink oral contrast and left AMA.

## 2023-07-05 ENCOUNTER — Telehealth: Payer: Self-pay | Admitting: Internal Medicine

## 2023-07-05 NOTE — Telephone Encounter (Signed)
 Pt scheduled to see Dr Elvin Hammer 6/19@11 :40am. Pt aware of appt.

## 2023-07-05 NOTE — Telephone Encounter (Signed)
 Inbound call from patient stating he has been having gas and abdominal pain. States he does not wish to wait to be seen. Patient's requesting a call back. Please advise, thank you

## 2023-07-05 NOTE — Telephone Encounter (Signed)
 Pt stated he has been having abd pain and diarrhea.

## 2023-07-11 ENCOUNTER — Ambulatory Visit: Admitting: Internal Medicine

## 2023-07-11 ENCOUNTER — Encounter: Payer: Self-pay | Admitting: Internal Medicine

## 2023-07-11 ENCOUNTER — Other Ambulatory Visit (INDEPENDENT_AMBULATORY_CARE_PROVIDER_SITE_OTHER)

## 2023-07-11 ENCOUNTER — Ambulatory Visit: Payer: Self-pay | Admitting: Internal Medicine

## 2023-07-11 VITALS — BP 142/80 | HR 80 | Ht 69.0 in | Wt 218.0 lb

## 2023-07-11 DIAGNOSIS — M7989 Other specified soft tissue disorders: Secondary | ICD-10-CM

## 2023-07-11 DIAGNOSIS — R109 Unspecified abdominal pain: Secondary | ICD-10-CM | POA: Diagnosis not present

## 2023-07-11 DIAGNOSIS — K219 Gastro-esophageal reflux disease without esophagitis: Secondary | ICD-10-CM | POA: Diagnosis not present

## 2023-07-11 DIAGNOSIS — R0602 Shortness of breath: Secondary | ICD-10-CM

## 2023-07-11 DIAGNOSIS — R197 Diarrhea, unspecified: Secondary | ICD-10-CM | POA: Diagnosis not present

## 2023-07-11 DIAGNOSIS — K222 Esophageal obstruction: Secondary | ICD-10-CM

## 2023-07-11 DIAGNOSIS — R634 Abnormal weight loss: Secondary | ICD-10-CM

## 2023-07-11 LAB — BASIC METABOLIC PANEL WITH GFR
BUN: 10 mg/dL (ref 6–23)
CO2: 28 meq/L (ref 19–32)
Calcium: 9.2 mg/dL (ref 8.4–10.5)
Chloride: 102 meq/L (ref 96–112)
Creatinine, Ser: 0.96 mg/dL (ref 0.40–1.50)
GFR: 76.34 mL/min (ref >60.00)
Glucose, Bld: 95 mg/dL (ref 70–99)
Potassium: 3.3 meq/L — ABNORMAL LOW (ref 3.5–5.1)
Sodium: 140 meq/L (ref 135–145)

## 2023-07-11 NOTE — Patient Instructions (Addendum)
 Your provider has requested that you go to the basement level for lab work before leaving today. Press B on the elevator. The lab is located at the first door on the left as you exit the elevator.   You have been scheduled for a CT scan of the chest, abdomen and pelvis at [facility] ([address], [place], [place] [address]) You are scheduled on 6/20/82025 at 3:30pm. You should arrive 15 minutes prior to your appointment time for registration. Please follow the written instructions below on the day of your exam:  WARNING: IF YOU ARE ALLERGIC TO IODINE/X-RAY DYE, PLEASE NOTIFY RADIOLOGY IMMEDIATELY AT [phone]! YOU WILL BE GIVEN A 13 HOUR PREMEDICATION PREP.   You may take any medications as prescribed with a small amount of water except for the following: Metformin, Glucophage, Glucovance, Avandamet, Riomet, Fortamet, Actoplus Met, Janumet, Glumetza or Metaglip. The above medications must be held the day of the exam AND 48 hours after the exam.   If you have any questions regarding your exam or if you need to reschedule, you may call the CT department at [phone] between the hours of 8:00 am and 5:00 pm, Monday-Friday.  ________________________________________________________________________

## 2023-07-12 ENCOUNTER — Ambulatory Visit (HOSPITAL_BASED_OUTPATIENT_CLINIC_OR_DEPARTMENT_OTHER)
Admission: RE | Admit: 2023-07-12 | Discharge: 2023-07-12 | Disposition: A | Discharge status: Home / Self Care | Source: Ambulatory Visit | Attending: Internal Medicine | Admitting: Internal Medicine

## 2023-07-12 DIAGNOSIS — M7989 Other specified soft tissue disorders: Secondary | ICD-10-CM | POA: Diagnosis not present

## 2023-07-12 DIAGNOSIS — J90 Pleural effusion, not elsewhere classified: Secondary | ICD-10-CM | POA: Diagnosis not present

## 2023-07-12 DIAGNOSIS — R634 Abnormal weight loss: Secondary | ICD-10-CM | POA: Diagnosis not present

## 2023-07-12 DIAGNOSIS — R188 Other ascites: Secondary | ICD-10-CM | POA: Diagnosis not present

## 2023-07-12 DIAGNOSIS — R0602 Shortness of breath: Secondary | ICD-10-CM | POA: Insufficient documentation

## 2023-07-12 DIAGNOSIS — N2889 Other specified disorders of kidney and ureter: Secondary | ICD-10-CM | POA: Diagnosis not present

## 2023-07-12 DIAGNOSIS — R109 Unspecified abdominal pain: Secondary | ICD-10-CM | POA: Insufficient documentation

## 2023-07-12 MED ORDER — IOHEXOL 300 MG/ML  SOLN
100.0000 mL | Freq: Once | INTRAMUSCULAR | Status: AC | PRN
Start: 2023-07-12 — End: 2023-07-12
  Administered 2023-07-12: 100 mL via INTRAVENOUS

## 2023-07-14 ENCOUNTER — Other Ambulatory Visit: Payer: Self-pay | Admitting: Family Medicine

## 2023-07-14 DIAGNOSIS — K219 Gastro-esophageal reflux disease without esophagitis: Secondary | ICD-10-CM

## 2023-07-16 ENCOUNTER — Encounter: Payer: Self-pay | Admitting: Internal Medicine

## 2023-07-16 ENCOUNTER — Ambulatory Visit

## 2023-07-16 NOTE — Progress Notes (Signed)
 HISTORY OF PRESENT ILLNESS:  Eric Cook is a 78 y.o. male with general medical problems as listed below as well as a history of GERD complicated by erosive esophagitis and peptic stricture for which he underwent upper endoscopy with esophageal dilation June 2023.  He was last seen in this office February 13, 2023.  At that time he was doing well on pantoprazole  with no active reflux symptoms or recurrent dysphagia.  He did describe occasional postprandial urgency with loose stools for which dicyclomine  helped.  He continues with this.  He did undergo Cologuard testing July 2023, which was negative.  The patient presents at this time with a number of complaints.  He reports worsening diarrhea over the past several weeks.  Decreased appetite.  Mild weight loss.  A sensation of swelling as well as shortness of breath.  He also notices issues with nausea and bloating.  This has been problematic over the past month.  He estimates 6 pound weight loss.  REVIEW OF SYSTEMS:  All non-GI ROS negative unless otherwise stated in the HPI except for anxiety, arthritis, back pain, night sweats  Past Medical History:  Diagnosis Date   Anxiety    HLD (hyperlipidemia)    Hyperlipidemia    Hypertension    Peptic ulcer    Seizures (HCC)    medication related    Past Surgical History:  Procedure Laterality Date   LUMBAR DISC SURGERY     L3-L4   UPPER GASTROINTESTINAL ENDOSCOPY      Social History Eric Cook  reports that he quit smoking about 4 years ago. His smoking use included cigarettes. He started smoking about 58 years ago. He has a 54 pack-year smoking history. He has never used smokeless tobacco. He reports that he does not drink alcohol and does not use drugs.  family history includes Colon cancer in his brother; Kidney Stones in his son; Other in his son.  No Known Allergies     PHYSICAL EXAMINATION: Vital signs: BP (!) 142/80   Pulse 80   Ht 5' 9 (1.753 m)   Wt 218 lb (98.9  kg)   BMI 32.19 kg/m   Constitutional: generally well-appearing, no acute distress Psychiatric: alert and oriented x3, cooperative Eyes: extraocular movements intact, anicteric, conjunctiva pink Mouth: oral pharynx moist, no lesions Neck: supple no lymphadenopathy Cardiovascular: heart regular rate and rhythm, no murmur Lungs: clear to auscultation bilaterally Abdomen: soft, nontender, nondistended, no obvious ascites, no peritoneal signs, normal bowel sounds, no organomegaly Rectal: Deferred until colonoscopy Extremities: no clubbing or cyanosis.  Trace lower extremity edema bilaterally Skin: no lesions on visible extremities Neuro: No focal deficits.  Cranial nerves intact  ASSESSMENT:  1.  Abdominal discomfort, shortness of breath, weight loss.  Rule out ominous diagnoses. 2.  Persistent diarrhea 3.  GERD complicated by peptic stricture on PPI 4.  General Medical problems   PLAN:  1.  Urgent CT scan of the chest abdomen and pelvis to evaluate shortness of breath, abdominal pain, the sensation of swelling, and weight loss. 2.  Stool studies pending 3.  Schedule colonoscopy with biopsies.The nature of the procedure, as well as the risks, benefits, and alternatives were carefully and thoroughly reviewed with the patient. Ample time for discussion and questions allowed. The patient understood, was satisfied, and agreed to proceed. 4.  Reflux precautions 5.  Continue PPI 6.  Further recommendations after the above A total time of 40 minutes was spent preparing to see the patient, reviewing a myriad  of data, obtaining comprehensive history, performing medically appropriate physical exam, counseling and educating the patient regarding the above listed issues, ordering advanced imaging, ordering colonoscopy, and documenting clinical information in the health record

## 2023-07-18 ENCOUNTER — Emergency Department (HOSPITAL_BASED_OUTPATIENT_CLINIC_OR_DEPARTMENT_OTHER)
Admission: EM | Admit: 2023-07-18 | Discharge: 2023-07-18 | Disposition: A | Discharge status: Home / Self Care | Attending: Emergency Medicine | Admitting: Emergency Medicine

## 2023-07-18 ENCOUNTER — Other Ambulatory Visit: Payer: Self-pay

## 2023-07-18 ENCOUNTER — Encounter (HOSPITAL_BASED_OUTPATIENT_CLINIC_OR_DEPARTMENT_OTHER): Payer: Self-pay | Admitting: Emergency Medicine

## 2023-07-18 ENCOUNTER — Emergency Department (HOSPITAL_BASED_OUTPATIENT_CLINIC_OR_DEPARTMENT_OTHER)

## 2023-07-18 DIAGNOSIS — R600 Localized edema: Secondary | ICD-10-CM | POA: Insufficient documentation

## 2023-07-18 DIAGNOSIS — Z87891 Personal history of nicotine dependence: Secondary | ICD-10-CM | POA: Insufficient documentation

## 2023-07-18 DIAGNOSIS — R609 Edema, unspecified: Secondary | ICD-10-CM

## 2023-07-18 DIAGNOSIS — Z79899 Other long term (current) drug therapy: Secondary | ICD-10-CM | POA: Insufficient documentation

## 2023-07-18 DIAGNOSIS — J449 Chronic obstructive pulmonary disease, unspecified: Secondary | ICD-10-CM | POA: Diagnosis not present

## 2023-07-18 DIAGNOSIS — I110 Hypertensive heart disease with heart failure: Secondary | ICD-10-CM | POA: Diagnosis not present

## 2023-07-18 DIAGNOSIS — Z7951 Long term (current) use of inhaled steroids: Secondary | ICD-10-CM | POA: Insufficient documentation

## 2023-07-18 DIAGNOSIS — M7989 Other specified soft tissue disorders: Secondary | ICD-10-CM | POA: Diagnosis not present

## 2023-07-18 DIAGNOSIS — I509 Heart failure, unspecified: Secondary | ICD-10-CM | POA: Diagnosis not present

## 2023-07-18 LAB — CBC WITH DIFFERENTIAL/PLATELET
Abs Immature Granulocytes: 0.01 10*3/uL (ref 0.00–0.07)
Basophils Absolute: 0 10*3/uL (ref 0.0–0.1)
Basophils Relative: 1 %
Eosinophils Absolute: 0.2 10*3/uL (ref 0.0–0.5)
Eosinophils Relative: 4 %
HCT: 39.5 % (ref 39.0–52.0)
Hemoglobin: 13.4 g/dL (ref 13.0–17.0)
Immature Granulocytes: 0 %
Lymphocytes Relative: 22 %
Lymphs Abs: 1.3 10*3/uL (ref 0.7–4.0)
MCH: 31.7 pg (ref 26.0–34.0)
MCHC: 33.9 g/dL (ref 30.0–36.0)
MCV: 93.4 fL (ref 80.0–100.0)
Monocytes Absolute: 0.7 10*3/uL (ref 0.1–1.0)
Monocytes Relative: 12 %
Neutro Abs: 3.6 10*3/uL (ref 1.7–7.7)
Neutrophils Relative %: 61 %
Platelets: 308 10*3/uL (ref 150–400)
RBC: 4.23 MIL/uL (ref 4.22–5.81)
RDW: 13.1 % (ref 11.5–15.5)
WBC: 5.9 10*3/uL (ref 4.0–10.5)
nRBC: 0 % (ref 0.0–0.2)

## 2023-07-18 LAB — BASIC METABOLIC PANEL WITH GFR
Anion gap: 12 (ref 5–15)
BUN: 14 mg/dL (ref 8–23)
CO2: 25 mmol/L (ref 22–32)
Calcium: 9.9 mg/dL (ref 8.9–10.3)
Chloride: 102 mmol/L (ref 98–111)
Creatinine, Ser: 0.97 mg/dL (ref 0.61–1.24)
GFR, Estimated: >60 mL/min (ref >60)
Glucose, Bld: 103 mg/dL — ABNORMAL HIGH (ref 70–99)
Potassium: 3.9 mmol/L (ref 3.5–5.1)
Sodium: 139 mmol/L (ref 135–145)

## 2023-07-18 LAB — PRO BRAIN NATRIURETIC PEPTIDE: Pro Brain Natriuretic Peptide: 746 pg/mL — ABNORMAL HIGH (ref <300.0)

## 2023-07-18 MED ORDER — LOSARTAN POTASSIUM 100 MG PO TABS: 100.0000 mg | ORAL_TABLET | Freq: Every day | ORAL | 0 refills | Status: DC

## 2023-07-18 MED ORDER — LOSARTAN POTASSIUM 25 MG PO TABS
50.0000 mg | ORAL_TABLET | Freq: Once | ORAL | Status: AC
  Administered 2023-07-18: 50 mg via ORAL
  Filled 2023-07-18: qty 2

## 2023-07-18 NOTE — ED Triage Notes (Signed)
 Pt caox4 c/o increased swelling in extremities x4 days. Pt reports he had swelling in his hands approx 57mo ago which improved, but noticed it began worsening 3-4 days ago along with in his feet. Pt also c/o nausea.

## 2023-07-18 NOTE — ED Notes (Signed)
 DC paperwork given and verbally understood.

## 2023-07-18 NOTE — Discharge Instructions (Addendum)
 While you are in the emergency room, you have blood work done that was normal.  We did an ultrasound of your heart that was normal.  I am increasing your losartan /Cozaar  to 100 mg/day.  I have sent a prescription to your pharmacy.  If you would like, you may use up your remaining losartan  pills by taking 2 of them each day.  Please call your primary care doctor next week for follow-up appointment.

## 2023-07-18 NOTE — ED Provider Notes (Signed)
 [place] [facility] DEPARTMENT AT [facility] Provider Note  CSN: [id number] Arrival date & time: 07/18/23 0915  Chief Complaint(s) Leg Swelling  HPI Eric Cook is a 78 y.o. male who is here today for swelling in his arms, primarily his left.  He reports that he has been having intermittent issues with this over the last couple of months.  Has gotten worse over the last 3 to 4 days.  Patient followed with his gastroenterologist 1 week ago who ordered a CT of the chest abdomen pelvis with contrast which was negative.   Past Medical History Past Medical History:  Diagnosis Date   Anxiety    HLD (hyperlipidemia)    Hyperlipidemia    Hypertension    Peptic ulcer    Seizures (HCC)    medication related   Patient Active Problem List   Diagnosis Date Noted   Hypokalemia 08/04/2022   Dyslipidemia 08/04/2022   Acute on chronic diastolic CHF (congestive heart failure) (HCC) 08/03/2022   COPD (chronic obstructive pulmonary disease) (HCC) 08/03/2022   CHF (congestive heart failure) (HCC) 08/03/2022   Partial symptomatic epilepsy with complex partial seizures, intractable, without status epilepticus (HCC) 12/01/2019   Urine frequency 11/09/2019   Diarrhea 11/09/2019   Closed compression fracture of L3 lumbar vertebra, initial encounter (HCC) 08/25/2019   Acute metabolic encephalopathy 08/24/2019   HTN (hypertension) 08/23/2019   BPH (benign prostatic hyperplasia) 08/23/2019   GAD (generalized anxiety disorder) 08/23/2019   Acute confusional state 08/23/2019   Home Medication(s) Prior to Admission medications   Medication Sig Start Date End Date Taking? Authorizing Provider  losartan  (COZAAR ) 100 MG tablet Take 1 tablet (100 mg total) by mouth daily for 14 days. 07/18/23 08/01/23 Yes Mannie Pac T, DO  acetaminophen  (TYLENOL ) 500 MG tablet Take 500 mg by mouth as needed for moderate pain or mild pain (As Needed).    [provider]  cetirizine  (ZYRTEC ) 10 MG  tablet Take 0.5 tablets (5 mg total) by mouth daily. 10/02/22   Cathlene Marry Lenis, FNP  cimetidine  (TAGAMET ) 400 MG tablet TAKE 1 TABLET TWICE DAILY AS NEEDED 07/15/23   Jolinda Potter M, DO  diclofenac (VOLTAREN) 75 MG EC tablet Take 75 mg by mouth 2 (two) times daily. 05/29/23   [provider]  dicyclomine  (BENTYL ) 10 MG capsule Take 2 capsules (20 mg total) by mouth 3 (three) times daily before meals. TAKE 2 CAPSULES (20 MG) 15 MINUTES BEFORE MEALS AS NEEDED; 02/21/23   Abran Norleen SAILOR, MD  fluticasone  (FLONASE ) 50 MCG/ACT nasal spray USE 2 SPRAYS IN EACH NOSTRIL EVERY DAY 08/13/22   Jolinda Potter M, DO  fluticasone  furoate-vilanterol (BREO ELLIPTA ) 200-25 MCG/ACT AEPB Inhale 1 puff into the lungs daily. 12/03/22   Hunsucker, Donnice JONELLE, MD  furosemide  (LASIX ) 20 MG tablet TAKE 1 TABLET (20 MG TOTAL) BY MOUTH DAILY AS NEEDED FOR FLUID. 01/09/23   Jolinda Potter M, DO  gabapentin (NEURONTIN) 300 MG capsule Take 300 mg by mouth 2 (two) times daily. 03/27/23   [provider]  levETIRAcetam  (KEPPRA  XR) 500 MG 24 hr tablet Take 1 tablet (500 mg total) by mouth at bedtime. 02/20/23   Whitfield Raisin, NP  losartan  (COZAAR ) 50 MG tablet Take 1 tablet (50 mg total) by mouth daily. 07/01/23   Jolinda Potter HERO, DO  ondansetron  (ZOFRAN -ODT) 4 MG disintegrating tablet Take 1 tablet (4 mg total) by mouth every 8 (eight) hours as needed for nausea or vomiting. 07/01/23   Jolinda Potter HERO, DO  pantoprazole  (PROTONIX ) 40 MG tablet Take 1 tablet (40 mg total) by mouth daily. 02/13/23   Abran Norleen SAILOR, MD  sertraline  (ZOLOFT ) 50 MG tablet TAKE 1 TABLET BY MOUTH EVERY DAY 06/04/23   Lavell Lye A, FNP  simvastatin  (ZOCOR ) 20 MG tablet TAKE 1 TABLET EVERY DAY AT 6 PM 05/29/23   Jolinda Potter M, DO  tamsulosin  (FLOMAX ) 0.4 MG CAPS capsule TAKE 1 CAPSULE EVERY DAY 12/04/22   Jolinda Potter M, DO  traZODone  (DESYREL ) 50 MG tablet TAKE 0.5-1 TABLETS BY MOUTH AT BEDTIME AS NEEDED FOR SLEEP.  11/21/22   Jolinda Potter HERO, DO                                                                                                                                    Past Surgical History Past Surgical History:  Procedure Laterality Date   LUMBAR DISC SURGERY     L3-L4   UPPER GASTROINTESTINAL ENDOSCOPY     Family History Family History  Problem Relation Age of Onset   Colon cancer Brother    Other Son        vertigo   Kidney Stones Son    Esophageal cancer Neg Hx    Pancreatic cancer Neg Hx    Stomach cancer Neg Hx     Social History Social History   Tobacco Use   Smoking status: Former    Current packs/day: 0.00    Average packs/day: 1 pack/day for 54.0 years (54.0 ttl pk-yrs)    Types: Cigarettes    Start date: 04/1965    Quit date: 04/2019    Years since quitting: 4.2   Smokeless tobacco: Never  Vaping Use   Vaping status: Never Used  Substance Use Topics   Alcohol use: Never   Drug use: Never   Allergies Patient has no known allergies.  Review of Systems Review of Systems  Physical Exam Vital Signs  I have reviewed the triage vital signs BP (!) 193/85   Pulse 71   Temp 98.3 F (36.8 C) (Oral)   Resp 18   Ht 5' 9 (1.753 m)   Wt 96.2 kg   SpO2 96%   BMI 31.31 kg/m   Physical Exam Vitals and nursing note reviewed.  Constitutional:      Appearance: He is not toxic-appearing.  HENT:     Head: Normocephalic.   Eyes:     Pupils: Pupils are equal, round, and reactive to light.    Cardiovascular:     Rate and Rhythm: Normal rate.     Comments: Bilateral brisk radial pulses Pulmonary:     Effort: Pulmonary effort is normal.  Abdominal:     General: Abdomen is flat.     Palpations: Abdomen is soft.   Musculoskeletal:     Cervical back: Normal range of motion. No rigidity.     Comments: Patient does have swelling of the left hand, some  swelling in the right hand.  No appreciable swelling in the lower extremities.  Lymphadenopathy:      Cervical: No cervical adenopathy.   Neurological:     General: No focal deficit present.     Mental Status: He is alert.     Comments: Normal grip strength bilaterally    ED Results and Treatments Labs (all labs ordered are listed, but only abnormal results are displayed) Labs Reviewed  BASIC METABOLIC PANEL WITH GFR - Abnormal; Notable for the following components:      Result Value   Glucose, Bld 103 (*)    All other components within normal limits  PRO BRAIN NATRIURETIC PEPTIDE - Abnormal; Notable for the following components:   Pro Brain Natriuretic Peptide 746.0 (*)    All other components within normal limits  CBC WITH DIFFERENTIAL/PLATELET                                                                                                                          Radiology US  Venous Img Upper Left (DVT Study) Result Date: 07/18/2023 CLINICAL DATA:  Left arm swelling for 4 days EXAM: Left UPPER EXTREMITY VENOUS DOPPLER ULTRASOUND TECHNIQUE: Gray-scale sonography with graded compression, as well as color Doppler and duplex ultrasound were performed to evaluate the upper extremity deep venous system from the level of the subclavian vein and including the jugular, axillary, basilic, radial, ulnar and upper cephalic vein. Spectral Doppler was utilized to evaluate flow at rest and with distal augmentation maneuvers. COMPARISON:  None Available. FINDINGS: Contralateral Subclavian Vein: Respiratory phasicity is normal and symmetric with the symptomatic side. No evidence of thrombus. Normal compressibility. Internal Jugular Vein: No evidence of thrombus. Normal compressibility, respiratory phasicity and response to augmentation. Subclavian Vein: No evidence of thrombus. Normal compressibility, respiratory phasicity and response to augmentation. Axillary Vein: No evidence of thrombus. Normal compressibility, respiratory phasicity and response to augmentation. Cephalic Vein: No evidence of thrombus.  Normal compressibility, respiratory phasicity and response to augmentation. Basilic Vein: No evidence of thrombus. Normal compressibility, respiratory phasicity and response to augmentation. Brachial Veins: No evidence of thrombus. Normal compressibility, respiratory phasicity and response to augmentation. Radial Veins: No evidence of thrombus. Normal compressibility, respiratory phasicity and response to augmentation. Ulnar Veins: No evidence of thrombus. Normal compressibility, respiratory phasicity and response to augmentation. Venous Reflux:  None visualized. Other Findings:  None visualized. IMPRESSION: No evidence of DVT within the left upper extremity. Electronically Signed   By: Ranell Bring M.D.   On: 07/18/2023 11:34    Pertinent labs & imaging results that were available during my care of the patient were reviewed by me and considered in my medical decision making (see MDM for details).  Medications Ordered in ED Medications  losartan  (COZAAR ) tablet 50 mg (has no administration in time range)  Procedures Ultrasound ED Echo  Date/Time: 07/18/2023 9:49 AM  Performed by: Mannie Fairy DASEN, DO Authorized by: Mannie Fairy DASEN, DO   Procedure details:    Indications comment:  Edema   Views: parasternal long axis view, parasternal short axis view and apical 4 chamber view     Images: archived     Limitations:  Positioning Findings:    Pericardium: no pericardial effusion     LV Function: normal (>50% EF)     RV Diameter: normal   Impression:    Impression: normal     (including critical care time)  Medical Decision Making / ED Course   This patient presents to the ED for concern of arm swelling, this involves an extensive number of treatment options, and is a complaint that carries with it a high risk of complications and morbidity.  The  differential diagnosis includes lymphedema, consider DVT, thoracic outlet, venous congestion.  MDM: Patient well-appearing on exam.  He has brisk pulses, no neurovascular deficits.  Does have some swelling in the left arm, worse than right.  Looking to the patient's chart, he has had issues with this over the last several months.  Had recent CT imaging of the chest abdomen pelvis which is negative, fortunately excludes mediastinal mass contributing symptoms.  Will obtain DVT ultrasound, but expect this to be negative.  I do believe the patient likely has some lymphedema, possibly secondary to inactivity or compressive process in the thoracic outlet.  Bedside ultrasound shows good EF.  This is consistent with prior echocardiogram.  Reassessment 1:45 AM-DVT ultrasound of the left upper extremity negative.  Patient's BNP is age-adjusted normal.  Blood work otherwise normal.  Discussed this with the patient, he is going to try to increase his activity, go through range of motion with his upper extremities, and follow-up with his PCP.  Patient noted to have an elevated blood pressure.  Will increase his Cozaar  to 100 daily.  He will follow-up with his PCP.   Additional history obtained: -Additional history obtained from  -External records from outside source obtained and reviewed including: Chart review including previous notes, labs, imaging, consultation notes   Lab Tests: -I ordered, reviewed, and interpreted labs.   The pertinent results include:   Labs Reviewed  BASIC METABOLIC PANEL WITH GFR - Abnormal; Notable for the following components:      Result Value   Glucose, Bld 103 (*)    All other components within normal limits  PRO BRAIN NATRIURETIC PEPTIDE - Abnormal; Notable for the following components:   Pro Brain Natriuretic Peptide 746.0 (*)    All other components within normal limits  CBC WITH DIFFERENTIAL/PLATELET        Imaging Studies ordered: I ordered imaging studies  including DVT ultrasound I independently visualized and interpreted imaging. I agree with the radiologist interpretation   Medicines ordered and prescription drug management: Meds ordered this encounter  Medications   losartan  (COZAAR ) tablet 50 mg   losartan  (COZAAR ) 100 MG tablet    Sig: Take 1 tablet (100 mg total) by mouth daily for 14 days.    Dispense:  14 tablet    Refill:  0    -I have reviewed the patients home medicines and have made adjustments as needed   Cardiac Monitoring: The patient was maintained on a cardiac monitor.  I personally viewed and interpreted the cardiac monitored which showed an underlying rhythm of: Normal sinus rhythm    Reevaluation: After the interventions noted above, I reevaluated  the patient and found that they have :improved  Co morbidities that complicate the patient evaluation  Past Medical History:  Diagnosis Date   Anxiety    HLD (hyperlipidemia)    Hyperlipidemia    Hypertension    Peptic ulcer    Seizures (HCC)    medication related      Dispostion: I considered admission for this patient, however he is appropriate for discharge.     Final Clinical Impression(s) / ED Diagnoses Final diagnoses:  Edema, unspecified type     @PCDICTATION @    Mannie Pac T, DO 07/18/23 1148

## 2023-07-27 ENCOUNTER — Other Ambulatory Visit: Payer: Self-pay | Admitting: Family Medicine

## 2023-07-29 ENCOUNTER — Ambulatory Visit (INDEPENDENT_AMBULATORY_CARE_PROVIDER_SITE_OTHER): Admitting: Family Medicine

## 2023-07-29 VITALS — BP 109/63 | HR 79 | Temp 98.0°F | Ht 69.0 in | Wt 206.0 lb

## 2023-07-29 DIAGNOSIS — I10 Essential (primary) hypertension: Secondary | ICD-10-CM

## 2023-07-29 DIAGNOSIS — I890 Lymphedema, not elsewhere classified: Secondary | ICD-10-CM

## 2023-07-29 DIAGNOSIS — M5416 Radiculopathy, lumbar region: Secondary | ICD-10-CM | POA: Diagnosis not present

## 2023-07-29 MED ORDER — METHYLPREDNISOLONE ACETATE 40 MG/ML IJ SUSP: 40.0000 mg | Freq: Once | INTRAMUSCULAR | Status: DC

## 2023-07-29 MED ORDER — LOSARTAN POTASSIUM 100 MG PO TABS: 100.0000 mg | ORAL_TABLET | Freq: Every day | ORAL | 4 refills | Status: DC

## 2023-07-29 NOTE — Progress Notes (Signed)
 Subjective: CC: Upper extremity swelling PCP: Jolinda Norene HERO, DO Eric Cook is a 78 y.o. male presenting to clinic today for:  1.  Upper extremity swelling/hypertension This has been ongoing for months left greater than right.  He has had extensive imaging to look for mediastinal masses etc. to explain this has been negative so far.  He had an ultrasound done in the ER and this was negative for DVT.  He reports persistent upper extremity swelling.  Additionally, noted to have elevated blood pressure so his losartan  was advanced to 100 mg daily  2.  Low back pain Patient with known lumbar radiculopathy and he is under the care of Dr. Nelida with the [facility].  He has been getting back injections he notices that they do help for a while but then the symptoms recur.  He continues to get numbness and tingling in the legs intermittently bilaterally.  He would like a second opinion.  Currently bracing in efforts to alleviate back discomfort   ROS: Per HPI  No Known Allergies Past Medical History:  Diagnosis Date   Anxiety    HLD (hyperlipidemia)    Hyperlipidemia    Hypertension    Peptic ulcer    Seizures (HCC)    medication related    Current Outpatient Medications:    acetaminophen  (TYLENOL ) 500 MG tablet, Take 500 mg by mouth as needed for moderate pain or mild pain (As Needed)., Disp: , Rfl:    cetirizine  (ZYRTEC ) 10 MG tablet, Take 0.5 tablets (5 mg total) by mouth daily., Disp: 30 tablet, Rfl: 5   cimetidine  (TAGAMET ) 400 MG tablet, TAKE 1 TABLET TWICE DAILY AS NEEDED, Disp: 180 tablet, Rfl: 3   diclofenac (VOLTAREN) 75 MG EC tablet, Take 75 mg by mouth 2 (two) times daily., Disp: , Rfl:    dicyclomine  (BENTYL ) 10 MG capsule, Take 2 capsules (20 mg total) by mouth 3 (three) times daily before meals. TAKE 2 CAPSULES (20 MG) 15 MINUTES BEFORE MEALS AS NEEDED;, Disp: 270 capsule, Rfl: 1   fluticasone  (FLONASE ) 50 MCG/ACT nasal spray, USE 2 SPRAYS IN EACH NOSTRIL  EVERY DAY, Disp: 48 each, Rfl: 3   fluticasone  furoate-vilanterol (BREO ELLIPTA ) 200-25 MCG/ACT AEPB, Inhale 1 puff into the lungs daily., Disp: 60 each, Rfl: 11   furosemide  (LASIX ) 20 MG tablet, TAKE 1 TABLET (20 MG TOTAL) BY MOUTH DAILY AS NEEDED FOR FLUID., Disp: 90 tablet, Rfl: 0   gabapentin (NEURONTIN) 300 MG capsule, Take 300 mg by mouth 2 (two) times daily., Disp: , Rfl:    levETIRAcetam  (KEPPRA  XR) 500 MG 24 hr tablet, Take 1 tablet (500 mg total) by mouth at bedtime., Disp: 90 tablet, Rfl: 3   losartan  (COZAAR ) 100 MG tablet, Take 1 tablet (100 mg total) by mouth daily for 14 days., Disp: 14 tablet, Rfl: 0   losartan  (COZAAR ) 50 MG tablet, Take 1 tablet (50 mg total) by mouth daily., Disp: 100 tablet, Rfl: 3   ondansetron  (ZOFRAN -ODT) 4 MG disintegrating tablet, Take 1 tablet (4 mg total) by mouth every 8 (eight) hours as needed for nausea or vomiting., Disp: 20 tablet, Rfl: 0   pantoprazole  (PROTONIX ) 40 MG tablet, Take 1 tablet (40 mg total) by mouth daily., Disp: 90 tablet, Rfl: 3   sertraline  (ZOLOFT ) 50 MG tablet, TAKE 1 TABLET BY MOUTH EVERY DAY, Disp: 90 tablet, Rfl: 2   simvastatin  (ZOCOR ) 20 MG tablet, TAKE 1 TABLET EVERY DAY AT 6 PM, Disp: 90 tablet, Rfl: 0  tamsulosin  (FLOMAX ) 0.4 MG CAPS capsule, TAKE 1 CAPSULE EVERY DAY, Disp: 90 capsule, Rfl: 3   traZODone  (DESYREL ) 50 MG tablet, TAKE 0.5-1 TABLETS BY MOUTH AT BEDTIME AS NEEDED FOR SLEEP., Disp: 90 tablet, Rfl: 3 Social History   Socioeconomic History   Marital status: Single    Spouse name: Not on file   Number of children: 1   Years of education: Not on file   Highest education level: Some college, no degree  Occupational History   Occupation: Lobbyist    Comment: part time  Tobacco Use   Smoking status: Former    Current packs/day: 0.00    Average packs/day: 1 pack/day for 54.0 years (54.0 ttl pk-yrs)    Types: Cigarettes    Start date: 04/1965    Quit date: 04/2019    Years since quitting:  4.2   Smokeless tobacco: Never  Vaping Use   Vaping status: Never Used  Substance and Sexual Activity   Alcohol use: Never   Drug use: Never   Sexual activity: Never  Other Topics Concern   Not on file  Social History Narrative   Lives alone   Right Handed   Drinks 2-3 cups of caffeine daily   Son in [place]   Social Drivers of Health   Financial Resource Strain: Patient Declined (07/29/2023)   Overall Financial Resource Strain (CARDIA)    Difficulty of Paying Living Expenses: Patient declined  Food Insecurity: No Food Insecurity (07/29/2023)   Hunger Vital Sign    Worried About Running Out of Food in the Last Year: Never true    Ran Out of Food in the Last Year: Never true  Transportation Needs: No Transportation Needs (07/29/2023)   PRAPARE - Administrator, Civil Service (Medical): No    Lack of Transportation (Non-Medical): No  Physical Activity: Sufficiently Active (07/29/2023)   Exercise Vital Sign    Days of Exercise per Week: 4 days    Minutes of Exercise per Session: 60 min  Stress: No Stress Concern Present (07/29/2023)   [employer] of Occupational Health - Occupational Stress Questionnaire    Feeling of Stress: Only a little  Social Connections: Socially Isolated (07/29/2023)   Social Connection and Isolation Panel    Frequency of Communication with Friends and Family: More than three times a week    Frequency of Social Gatherings with Friends and Family: Twice a week    Attends Religious Services: Patient declined    Database administrator or Organizations: No    Attends Engineer, structural: Not on file    Marital Status: Divorced  Intimate Partner Violence: Not At Risk (06/24/2023)   Humiliation, Afraid, Rape, and Kick questionnaire    Fear of Current or Ex-Partner: No    Emotionally Abused: No    Physically Abused: No    Sexually Abused: No   Family History  Problem Relation Age of Onset   Colon cancer Brother    Other Son         vertigo   Kidney Stones Son    Esophageal cancer Neg Hx    Pancreatic cancer Neg Hx    Stomach cancer Neg Hx     Objective: Office vital signs reviewed. BP 109/63   Pulse 79   Temp 98 F (36.7 C)   Ht 5' 9 (1.753 m)   Wt 206 lb (93.4 kg)   SpO2 92%   BMI 30.42 kg/m   Physical Examination:  General:  Awake, alert, well nourished, No acute distress HEENT: Sclera white.  Moist mucous membranes Cardio: regular rate and rhythm, S1S2 heard, no murmurs appreciated Pulm: clear to auscultation bilaterally, no wheezes, rhonchi or rales; normal work of breathing on room air MSK: Ambulating independently but gait is antalgic and his station is slightly hunched.  He has a back brace in place. Extremities: I did not appreciate gross swelling of the upper extremities but he does have some generalized mild edema noted.  Certainly no induration, warmth or erythema  Assessment/ Plan: 78 y.o. male   Lymphedema - Plan: Basic Metabolic Panel, Ambulatory referral to Occupational Therapy  Essential hypertension - Plan: Basic Metabolic Panel, losartan  (COZAAR ) 100 MG tablet  Lumbar radiculopathy - Plan: methylPREDNISolone  acetate (DEPO-MEDROL ) injection 40 mg, Ambulatory referral to Orthopedic Surgery  I am referring him to lymphedema clinic to see if maybe they can help move this fluid.  There has not been any evidence of malignancy etc. to explain why he has been having this recurrent swelling in the upper extremities.  Will also check BMP given advancement of ARB.  Renewal sent.  Blood pressure was controlled  Referral to orthopedics at [facility] placed for second opinion.  In the interim I have given him a Depo-Medrol  injection in efforts to alleviate some of the discomfort he has been experiencing.   Norene CHRISTELLA Fielding, DO [facility] [phone]

## 2023-07-30 ENCOUNTER — Ambulatory Visit: Payer: Self-pay | Admitting: Family Medicine

## 2023-07-30 LAB — BASIC METABOLIC PANEL WITH GFR
BUN/Creatinine Ratio: 11 (ref 10–24)
BUN: 14 mg/dL (ref 8–27)
CO2: 21 mmol/L (ref 20–29)
Calcium: 9.7 mg/dL (ref 8.6–10.2)
Chloride: 99 mmol/L (ref 96–106)
Creatinine, Ser: 1.26 mg/dL (ref 0.76–1.27)
Glucose: 97 mg/dL (ref 70–99)
Potassium: 3.7 mmol/L (ref 3.5–5.2)
Sodium: 140 mmol/L (ref 134–144)
eGFR: 59 mL/min/1.73 — ABNORMAL LOW (ref >59)

## 2023-08-07 ENCOUNTER — Telehealth: Payer: Self-pay | Admitting: Internal Medicine

## 2023-08-07 DIAGNOSIS — K222 Esophageal obstruction: Secondary | ICD-10-CM

## 2023-08-07 DIAGNOSIS — K219 Gastro-esophageal reflux disease without esophagitis: Secondary | ICD-10-CM

## 2023-08-07 DIAGNOSIS — K209 Esophagitis, unspecified without bleeding: Secondary | ICD-10-CM

## 2023-08-07 DIAGNOSIS — R1310 Dysphagia, unspecified: Secondary | ICD-10-CM

## 2023-08-07 NOTE — Telephone Encounter (Signed)
 Eric Cook this pt was supposed to be scheduled for a colonoscopy. I don't see mention of it and he is not scheduled. Do you know anything about this?

## 2023-08-07 NOTE — Telephone Encounter (Signed)
 Spoke with pt and explained the doctor wanted to do a colon to take bx to see why having the diarrhea. Pt stated he had done a cologuard. He now understands why he wanted to do the colon and is willing to schedule. Please schedule pt for colon.

## 2023-08-07 NOTE — Telephone Encounter (Signed)
 Inbound call from patient stating he is still having diarrhea and abdominal pain. Requesting a call back to discuss options to help. Please advise, thank you.

## 2023-08-08 NOTE — Telephone Encounter (Signed)
 Leslie, pls obtain further details. When did abdominal pain start and exactly what area of his abdomen. Is this abd pain and diarrhea worse now than when he was seen by Dr. Abran and had CTAP done? Any fever?

## 2023-08-08 NOTE — Telephone Encounter (Signed)
 Spoke with patient who is complaining of abdominal pain.  He is scheduled for a colon in August.   He was given Bentyl  back in January by Dr. Abran.  Is it ok to refill this?

## 2023-08-09 ENCOUNTER — Telehealth: Payer: Self-pay

## 2023-08-09 MED ORDER — DICYCLOMINE HCL 10 MG PO CAPS: 20.0000 mg | ORAL_CAPSULE | Freq: Three times a day (TID) | ORAL | 1 refills | Status: DC

## 2023-08-09 NOTE — Telephone Encounter (Signed)
 Spoke to patient and told him I had refilled his Bentyl , with which he has had good results in the past.

## 2023-08-09 NOTE — Telephone Encounter (Signed)
 I had sent this request because at first I couldn't find where Dr. Abran said it was ok to give Bentyl .  I have since found documentation from his office visit last month where Dr. Abran acknowledges the success of Bentyl  and notates that he is using it.  His colon is scheduled for next month to find out the cause of his symptoms.  So I think I'm ok to refill it it.  Sorry!  Thanks!

## 2023-08-14 ENCOUNTER — Other Ambulatory Visit: Payer: Self-pay | Admitting: Family Medicine

## 2023-08-14 DIAGNOSIS — E782 Mixed hyperlipidemia: Secondary | ICD-10-CM

## 2023-08-20 ENCOUNTER — Ambulatory Visit (AMBULATORY_SURGERY_CENTER)

## 2023-08-20 VITALS — Ht 69.0 in | Wt 205.0 lb

## 2023-08-20 DIAGNOSIS — R109 Unspecified abdominal pain: Secondary | ICD-10-CM

## 2023-08-20 DIAGNOSIS — R194 Change in bowel habit: Secondary | ICD-10-CM

## 2023-08-20 DIAGNOSIS — Z800 Family history of malignant neoplasm of digestive organs: Secondary | ICD-10-CM

## 2023-08-20 MED ORDER — NA SULFATE-K SULFATE-MG SULF 17.5-3.13-1.6 GM/177ML PO SOLN: 1.0000 | Freq: Once | ORAL | 0 refills | Status: AC

## 2023-08-20 NOTE — Progress Notes (Signed)
 Pre visit completed via phone call; Patient verified name, DOB, and address; No egg or soy allergy known to patient;  No issues known to pt with past sedation with any surgeries or procedures; Patient denies ever being told they had issues or difficulty with intubation;  No FH of Malignant Hyperthermia Pt is not on diet pills; Pt is not on home 02  Pt is not on blood thinners  Pt denies issues with constipation  No A fib or A flutter Have any cardiac testing pending--NO Insurance verified during PV appt--- [employer] Medicare Pt can ambulate without assistance;  Pt denies use of chewing tobacco Discussed diabetic/weight loss medication holds; Discussed NSAID holds; Checked BMI to be less than 50; Pt instructed to use Singlecare.com or GoodRx for a price reduction on prep  Patient's chart reviewed by Norleen Schillings CNRA prior to previsit and patient appropriate for the [facility].  Pre visit completed and red dot placed by patient's name on their procedure day (on provider's schedule).    Patient reports his last seizure episode was in 2020;   Instructions printed and mailed to the patient per his request;

## 2023-08-21 ENCOUNTER — Encounter: Payer: Self-pay | Admitting: Internal Medicine

## 2023-09-02 ENCOUNTER — Encounter: Payer: Self-pay | Admitting: Internal Medicine

## 2023-09-02 ENCOUNTER — Ambulatory Visit: Admitting: Internal Medicine

## 2023-09-02 VITALS — BP 137/86 | HR 70 | Temp 97.7°F | Resp 18 | Ht 69.0 in | Wt 205.0 lb

## 2023-09-02 DIAGNOSIS — Z1211 Encounter for screening for malignant neoplasm of colon: Secondary | ICD-10-CM

## 2023-09-02 DIAGNOSIS — D124 Benign neoplasm of descending colon: Secondary | ICD-10-CM | POA: Diagnosis not present

## 2023-09-02 DIAGNOSIS — R109 Unspecified abdominal pain: Secondary | ICD-10-CM | POA: Diagnosis not present

## 2023-09-02 DIAGNOSIS — Z800 Family history of malignant neoplasm of digestive organs: Secondary | ICD-10-CM | POA: Diagnosis not present

## 2023-09-02 DIAGNOSIS — D125 Benign neoplasm of sigmoid colon: Secondary | ICD-10-CM | POA: Diagnosis not present

## 2023-09-02 DIAGNOSIS — R195 Other fecal abnormalities: Secondary | ICD-10-CM

## 2023-09-02 DIAGNOSIS — D120 Benign neoplasm of cecum: Secondary | ICD-10-CM

## 2023-09-02 DIAGNOSIS — R194 Change in bowel habit: Secondary | ICD-10-CM | POA: Diagnosis not present

## 2023-09-02 DIAGNOSIS — D123 Benign neoplasm of transverse colon: Secondary | ICD-10-CM | POA: Diagnosis not present

## 2023-09-02 DIAGNOSIS — K5730 Diverticulosis of large intestine without perforation or abscess without bleeding: Secondary | ICD-10-CM

## 2023-09-02 DIAGNOSIS — D122 Benign neoplasm of ascending colon: Secondary | ICD-10-CM

## 2023-09-02 DIAGNOSIS — R197 Diarrhea, unspecified: Secondary | ICD-10-CM | POA: Diagnosis not present

## 2023-09-02 MED ORDER — SODIUM CHLORIDE 0.9 % IV SOLN: 500.0000 mL | INTRAVENOUS | Status: DC

## 2023-09-02 NOTE — Op Note (Signed)
 [facility] Patient Name: Eric Cook Procedure Date: 09/02/2023 2:18 PM MRN: [id number] Endoscopist: Norleen SAILOR. Abran , MD, [id number] Age: 78 Referring MD:  Date of Birth: November 19, 1945 Gender: Male Account #: [id number] Procedure:                Colonoscopy with cold snare polypectomy x 6; with                            biopsies Indications:              Screening for colorectal malignant neoplasm.                            Brother with a history of colon cancer. Medicines:                Monitored Anesthesia Care Procedure:                Pre-Anesthesia Assessment:                           - Prior to the procedure, a History and Physical                            was performed, and patient medications and                            allergies were reviewed. The patient's tolerance of                            previous anesthesia was also reviewed. The risks                            and benefits of the procedure and the sedation                            options and risks were discussed with the patient.                            All questions were answered, and informed consent                            was obtained. Prior Anticoagulants: The patient has                            taken no anticoagulant or antiplatelet agents. ASA                            Grade Assessment: II - A patient with mild systemic                            disease. After reviewing the risks and benefits,                            the patient was deemed in satisfactory condition to  undergo the procedure.                           After obtaining informed consent, the colonoscope                            was passed under direct vision. Throughout the                            procedure, the patient's blood pressure, pulse, and                            oxygen  saturations were monitored continuously. The                            CF [id number] #[id number] was  introduced through the anus                            and advanced to the the cecum, identified by                            appendiceal orifice and ileocecal valve. The                            ileocecal valve, appendiceal orifice, and rectum                            were photographed. The quality of the bowel                            preparation was excellent. The colonoscopy was                            performed without difficulty. The patient tolerated                            the procedure well. The bowel preparation used was                            SUPREP via split dose instruction. Scope In: 2:35:58 PM Scope Out: 2:53:23 PM Scope Withdrawal Time: 0 hours 13 minutes 49 seconds  Total Procedure Duration: 0 hours 17 minutes 25 seconds  Findings:                 Six polyps were found in the sigmoid colon,                            descending colon, transverse colon, ascending colon                            and cecum. The polyps were 4 to 6 mm in size. These                            polyps were removed with a cold snare. Resection  and retrieval were complete.                           Diverticula were found in the sigmoid colon and                            ascending colon.                           The exam was otherwise without abnormality on                            direct and retroflexion views.                           Biopsies for histology were taken with a cold                            forceps from the entire colon for evaluation of                            microscopic colitis. Complications:            No immediate complications. Estimated blood loss:                            None. Estimated Blood Loss:     Estimated blood loss: none. Impression:               - Six 4 to 6 mm polyps in the sigmoid colon, in the                            descending colon, in the transverse colon, in the                             ascending colon and in the cecum, removed with a                            cold snare. Resected and retrieved.                           - Diverticulosis in the sigmoid colon and in the                            ascending colon.                           - The examination was otherwise normal on direct                            and retroflexion views.                           - Biopsies were taken with a cold forceps from the  entire colon for evaluation of microscopic colitis. Recommendation:           - Repeat colonoscopy in 3 years for surveillance.                           - Patient has a contact number available for                            emergencies. The signs and symptoms of potential                            delayed complications were discussed with the                            patient. Return to normal activities tomorrow.                            Written discharge instructions were provided to the                            patient.                           - Resume previous diet.                           - Continue present medications.                           - Await pathology results.                           - For loose bowels take Citrucel powder 2                            tablespoons daily in 14 ounces of water or juice [name], MD 09/02/2023 3:04:14 PM This report has been signed electronically.

## 2023-09-02 NOTE — Progress Notes (Signed)
 Sedate, gd SR, tolerated procedure well, VSS, report to RN

## 2023-09-02 NOTE — Progress Notes (Signed)
 Pt's states no medical or surgical changes since previsit or office visit.

## 2023-09-02 NOTE — Patient Instructions (Addendum)
   Handouts on polyps,diverticulosis,& hemorrhoids given to you today.   Await pathology results on polyps removed & results of colon biopsies taken  Continue previous diet & medications  For loose bowels take Citrucel powder 2 tablespoons daily in 14 ounces water or juice       YOU HAD AN ENDOSCOPIC PROCEDURE TODAY AT THE [facility]:   Refer to the procedure report that was given to you for any specific questions about what was found during the examination.  If the procedure report does not answer your questions, please call your gastroenterologist to clarify.  If you requested that your care partner not be given the details of your procedure findings, then the procedure report has been included in a sealed envelope for you to review at your convenience later.  YOU SHOULD EXPECT: Some feelings of bloating in the abdomen. Passage of more gas than usual.  Walking can help get rid of the air that was put into your GI tract during the procedure and reduce the bloating. If you had a lower endoscopy (such as a colonoscopy or flexible sigmoidoscopy) you may notice spotting of blood in your stool or on the toilet paper. If you underwent a bowel prep for your procedure, you may not have a normal bowel movement for a few days.  Please Note:  You might notice some irritation and congestion in your nose or some drainage.  This is from the oxygen  used during your procedure.  There is no need for concern and it should clear up in a day or so.  SYMPTOMS TO REPORT IMMEDIATELY:  Following lower endoscopy (colonoscopy or flexible sigmoidoscopy):  Excessive amounts of blood in the stool  Significant tenderness or worsening of abdominal pains  Swelling of the abdomen that is new, acute  Fever of 100F or higher  For urgent or emergent issues, a gastroenterologist can be reached at any hour by calling [phone]. Do not use MyChart messaging for urgent concerns.    DIET:  We do recommend  a small meal at first, but then you may proceed to your regular diet.  Drink plenty of fluids but you should avoid alcoholic beverages for 24 hours.  ACTIVITY:  You should plan to take it easy for the rest of today and you should NOT DRIVE or use heavy machinery until tomorrow (because of the sedation medicines used during the test).    FOLLOW UP: Our staff will call the number listed on your records the next business day following your procedure.  We will call around 7:15- 8:00 am to check on you and address any questions or concerns that you may have regarding the information given to you following your procedure. If we do not reach you, we will leave a message.     If any biopsies were taken you will be contacted by phone or by letter within the next 1-3 weeks.  Please call us  at [phone] if you have not heard about the biopsies in 3 weeks.    SIGNATURES/CONFIDENTIALITY: You and/or your care partner have signed paperwork which will be entered into your electronic medical record.  These signatures attest to the fact that that the information above on your After Visit Summary has been reviewed and is understood.  Full responsibility of the confidentiality of this discharge information lies with you and/or your care-partner.

## 2023-09-02 NOTE — Progress Notes (Signed)
 Called to room to assist during endoscopic procedure.  Patient ID and intended procedure confirmed with present staff. Received instructions for my participation in the procedure from the performing physician.

## 2023-09-02 NOTE — Progress Notes (Signed)
 Expand All Collapse All HISTORY OF PRESENT ILLNESS:   Eric Cook is a 78 y.o. male with general medical problems as listed below as well as a history of GERD complicated by erosive esophagitis and peptic stricture for which he underwent upper endoscopy with esophageal dilation June 2023.  He was last seen in this office February 13, 2023.  At that time he was doing well on pantoprazole  with no active reflux symptoms or recurrent dysphagia.  He did describe occasional postprandial urgency with loose stools for which dicyclomine  helped.  He continues with this.  He did undergo Cologuard testing July 2023, which was negative.   The patient presents at this time with a number of complaints.  He reports worsening diarrhea over the past several weeks.  Decreased appetite.  Mild weight loss.  A sensation of swelling as well as shortness of breath.  He also notices issues with nausea and bloating.  This has been problematic over the past month.  He estimates 6 pound weight loss.   REVIEW OF SYSTEMS:   All non-GI ROS negative unless otherwise stated in the HPI except for anxiety, arthritis, back pain, night sweats       Past Medical History:  Diagnosis Date   Anxiety     HLD (hyperlipidemia)     Hyperlipidemia     Hypertension     Peptic ulcer     Seizures (HCC)      medication related               Past Surgical History:  Procedure Laterality Date   LUMBAR DISC SURGERY        L3-L4   UPPER GASTROINTESTINAL ENDOSCOPY              Social History BROOKE PAYES  reports that he quit smoking about 4 years ago. His smoking use included cigarettes. He started smoking about 58 years ago. He has a 54 pack-year smoking history. He has never used smokeless tobacco. He reports that he does not drink alcohol and does not use drugs.   family history includes Colon cancer in his brother; Kidney Stones in his son; Other in his son.   Allergies  No Known Allergies         PHYSICAL  EXAMINATION: Vital signs: BP (!) 142/80   Pulse 80   Ht 5' 9 (1.753 m)   Wt 218 lb (98.9 kg)   BMI 32.19 kg/m   Constitutional: generally well-appearing, no acute distress Psychiatric: alert and oriented x3, cooperative Eyes: extraocular movements intact, anicteric, conjunctiva pink Mouth: oral pharynx moist, no lesions Neck: supple no lymphadenopathy Cardiovascular: heart regular rate and rhythm, no murmur Lungs: clear to auscultation bilaterally Abdomen: soft, nontender, nondistended, no obvious ascites, no peritoneal signs, normal bowel sounds, no organomegaly Rectal: Deferred until colonoscopy Extremities: no clubbing or cyanosis.  Trace lower extremity edema bilaterally Skin: no lesions on visible extremities Neuro: No focal deficits.  Cranial nerves intact   ASSESSMENT:   1.  Abdominal discomfort, shortness of breath, weight loss.  Rule out ominous diagnoses. 2.  Persistent diarrhea 3.  GERD complicated by peptic stricture on PPI 4.  General Medical problems     PLAN:   1.  Urgent CT scan of the chest abdomen and pelvis to evaluate shortness of breath, abdominal pain, the sensation of swelling, and weight loss. 2.  Stool studies pending 3.  Schedule colonoscopy with biopsies.The nature of the procedure, as well as the risks, benefits, and alternatives  were carefully and thoroughly reviewed with the patient. Ample time for discussion and questions allowed. The patient understood, was satisfied, and agreed to proceed. 4.  Reflux precautions 5.  Continue PPI 6.  Further recommendations after the above      Recent H&P as above.  No significant interval change.  Interval CT scan was negative for acute abnormalities.  Now for colonoscopy with biopsies

## 2023-09-03 ENCOUNTER — Telehealth: Payer: Self-pay | Admitting: *Deleted

## 2023-09-03 NOTE — Telephone Encounter (Signed)
  Follow up Call-     09/02/2023    1:57 PM 07/14/2021    7:32 AM  Call back number  Post procedure Call Back phone  # [phone]  Permission to leave phone message Yes Yes     Patient questions:  Do you have a fever, pain , or abdominal swelling? No. Pain Score  0 *  Have you tolerated food without any problems? Yes.    Have you been able to return to your normal activities? Yes.    Do you have any questions about your discharge instructions: Diet   No. Medications  No. Follow up visit  No.  Do you have questions or concerns about your Care? No.  Actions: * If pain score is 4 or above: No action needed, pain <4.

## 2023-09-05 LAB — SURGICAL PATHOLOGY

## 2023-09-07 ENCOUNTER — Ambulatory Visit: Payer: Self-pay | Admitting: Internal Medicine

## 2023-09-10 ENCOUNTER — Telehealth: Payer: Self-pay | Admitting: Internal Medicine

## 2023-09-10 NOTE — Telephone Encounter (Signed)
 Patient advised that Dr Abran reviewed his pathology from colonoscopy today and sent a letter. Read patient the letter and he had no further questions at the end of our call.

## 2023-09-10 NOTE — Telephone Encounter (Signed)
 PT is calling to discuss results of colonoscopy on 8/11. Please advise.

## 2023-09-20 DIAGNOSIS — M25642 Stiffness of left hand, not elsewhere classified: Secondary | ICD-10-CM | POA: Diagnosis not present

## 2023-09-20 DIAGNOSIS — M25641 Stiffness of right hand, not elsewhere classified: Secondary | ICD-10-CM | POA: Diagnosis not present

## 2023-10-03 DIAGNOSIS — H5203 Hypermetropia, bilateral: Secondary | ICD-10-CM | POA: Diagnosis not present

## 2023-10-04 ENCOUNTER — Emergency Department (HOSPITAL_BASED_OUTPATIENT_CLINIC_OR_DEPARTMENT_OTHER)
Admission: EM | Admit: 2023-10-04 | Discharge: 2023-10-04 | Disposition: A | Discharge status: Home / Self Care | Source: Home / Self Care | Attending: Emergency Medicine | Admitting: Emergency Medicine

## 2023-10-04 ENCOUNTER — Emergency Department (HOSPITAL_BASED_OUTPATIENT_CLINIC_OR_DEPARTMENT_OTHER): Admitting: Radiology

## 2023-10-04 ENCOUNTER — Encounter (HOSPITAL_BASED_OUTPATIENT_CLINIC_OR_DEPARTMENT_OTHER): Payer: Self-pay | Admitting: *Deleted

## 2023-10-04 ENCOUNTER — Other Ambulatory Visit: Payer: Self-pay

## 2023-10-04 ENCOUNTER — Ambulatory Visit: Payer: Self-pay

## 2023-10-04 DIAGNOSIS — R140 Abdominal distension (gaseous): Secondary | ICD-10-CM | POA: Diagnosis not present

## 2023-10-04 DIAGNOSIS — Z79899 Other long term (current) drug therapy: Secondary | ICD-10-CM | POA: Diagnosis not present

## 2023-10-04 DIAGNOSIS — I110 Hypertensive heart disease with heart failure: Secondary | ICD-10-CM | POA: Diagnosis present

## 2023-10-04 DIAGNOSIS — F39 Unspecified mood [affective] disorder: Secondary | ICD-10-CM | POA: Diagnosis present

## 2023-10-04 DIAGNOSIS — M4856XA Collapsed vertebra, not elsewhere classified, lumbar region, initial encounter for fracture: Secondary | ICD-10-CM | POA: Diagnosis present

## 2023-10-04 DIAGNOSIS — Z6830 Body mass index (BMI) 30.0-30.9, adult: Secondary | ICD-10-CM | POA: Diagnosis not present

## 2023-10-04 DIAGNOSIS — N390 Urinary tract infection, site not specified: Secondary | ICD-10-CM | POA: Diagnosis not present

## 2023-10-04 DIAGNOSIS — J449 Chronic obstructive pulmonary disease, unspecified: Secondary | ICD-10-CM | POA: Diagnosis present

## 2023-10-04 DIAGNOSIS — R4701 Aphasia: Secondary | ICD-10-CM | POA: Diagnosis present

## 2023-10-04 DIAGNOSIS — R9389 Abnormal findings on diagnostic imaging of other specified body structures: Secondary | ICD-10-CM | POA: Diagnosis not present

## 2023-10-04 DIAGNOSIS — R4182 Altered mental status, unspecified: Secondary | ICD-10-CM | POA: Diagnosis not present

## 2023-10-04 DIAGNOSIS — E872 Acidosis, unspecified: Secondary | ICD-10-CM | POA: Diagnosis present

## 2023-10-04 DIAGNOSIS — K5730 Diverticulosis of large intestine without perforation or abscess without bleeding: Secondary | ICD-10-CM | POA: Diagnosis not present

## 2023-10-04 DIAGNOSIS — R079 Chest pain, unspecified: Secondary | ICD-10-CM | POA: Diagnosis not present

## 2023-10-04 DIAGNOSIS — I779 Disorder of arteries and arterioles, unspecified: Secondary | ICD-10-CM | POA: Diagnosis not present

## 2023-10-04 DIAGNOSIS — I159 Secondary hypertension, unspecified: Secondary | ICD-10-CM | POA: Insufficient documentation

## 2023-10-04 DIAGNOSIS — I700 Atherosclerosis of aorta: Secondary | ICD-10-CM | POA: Diagnosis not present

## 2023-10-04 DIAGNOSIS — R2990 Unspecified symptoms and signs involving the nervous system: Secondary | ICD-10-CM | POA: Diagnosis not present

## 2023-10-04 DIAGNOSIS — R519 Headache, unspecified: Secondary | ICD-10-CM | POA: Diagnosis not present

## 2023-10-04 DIAGNOSIS — I6503 Occlusion and stenosis of bilateral vertebral arteries: Secondary | ICD-10-CM | POA: Diagnosis not present

## 2023-10-04 DIAGNOSIS — G459 Transient cerebral ischemic attack, unspecified: Secondary | ICD-10-CM | POA: Diagnosis present

## 2023-10-04 DIAGNOSIS — R29818 Other symptoms and signs involving the nervous system: Secondary | ICD-10-CM | POA: Diagnosis not present

## 2023-10-04 DIAGNOSIS — R569 Unspecified convulsions: Secondary | ICD-10-CM | POA: Diagnosis not present

## 2023-10-04 DIAGNOSIS — G9341 Metabolic encephalopathy: Secondary | ICD-10-CM | POA: Diagnosis not present

## 2023-10-04 DIAGNOSIS — G319 Degenerative disease of nervous system, unspecified: Secondary | ICD-10-CM | POA: Diagnosis present

## 2023-10-04 DIAGNOSIS — E119 Type 2 diabetes mellitus without complications: Secondary | ICD-10-CM | POA: Diagnosis present

## 2023-10-04 DIAGNOSIS — E785 Hyperlipidemia, unspecified: Secondary | ICD-10-CM | POA: Diagnosis present

## 2023-10-04 DIAGNOSIS — D72829 Elevated white blood cell count, unspecified: Secondary | ICD-10-CM | POA: Diagnosis present

## 2023-10-04 DIAGNOSIS — M4854XA Collapsed vertebra, not elsewhere classified, thoracic region, initial encounter for fracture: Secondary | ICD-10-CM | POA: Diagnosis present

## 2023-10-04 DIAGNOSIS — R059 Cough, unspecified: Secondary | ICD-10-CM | POA: Diagnosis not present

## 2023-10-04 DIAGNOSIS — N281 Cyst of kidney, acquired: Secondary | ICD-10-CM | POA: Diagnosis not present

## 2023-10-04 DIAGNOSIS — G40909 Epilepsy, unspecified, not intractable, without status epilepticus: Secondary | ICD-10-CM | POA: Diagnosis present

## 2023-10-04 DIAGNOSIS — I160 Hypertensive urgency: Secondary | ICD-10-CM | POA: Diagnosis present

## 2023-10-04 DIAGNOSIS — R112 Nausea with vomiting, unspecified: Secondary | ICD-10-CM | POA: Diagnosis not present

## 2023-10-04 DIAGNOSIS — I6782 Cerebral ischemia: Secondary | ICD-10-CM | POA: Diagnosis not present

## 2023-10-04 DIAGNOSIS — K219 Gastro-esophageal reflux disease without esophagitis: Secondary | ICD-10-CM | POA: Diagnosis present

## 2023-10-04 DIAGNOSIS — N400 Enlarged prostate without lower urinary tract symptoms: Secondary | ICD-10-CM | POA: Diagnosis present

## 2023-10-04 DIAGNOSIS — K6389 Other specified diseases of intestine: Secondary | ICD-10-CM | POA: Diagnosis not present

## 2023-10-04 DIAGNOSIS — I5032 Chronic diastolic (congestive) heart failure: Secondary | ICD-10-CM | POA: Diagnosis present

## 2023-10-04 DIAGNOSIS — Z1152 Encounter for screening for COVID-19: Secondary | ICD-10-CM | POA: Diagnosis not present

## 2023-10-04 DIAGNOSIS — I6523 Occlusion and stenosis of bilateral carotid arteries: Secondary | ICD-10-CM | POA: Diagnosis present

## 2023-10-04 DIAGNOSIS — I672 Cerebral atherosclerosis: Secondary | ICD-10-CM | POA: Diagnosis not present

## 2023-10-04 DIAGNOSIS — Z87891 Personal history of nicotine dependence: Secondary | ICD-10-CM | POA: Diagnosis not present

## 2023-10-04 DIAGNOSIS — J9601 Acute respiratory failure with hypoxia: Secondary | ICD-10-CM | POA: Diagnosis present

## 2023-10-04 DIAGNOSIS — I10 Essential (primary) hypertension: Secondary | ICD-10-CM | POA: Diagnosis not present

## 2023-10-04 DIAGNOSIS — R1110 Vomiting, unspecified: Secondary | ICD-10-CM | POA: Diagnosis not present

## 2023-10-04 DIAGNOSIS — F05 Delirium due to known physiological condition: Secondary | ICD-10-CM | POA: Diagnosis not present

## 2023-10-04 LAB — BASIC METABOLIC PANEL WITH GFR
Anion gap: 15 (ref 5–15)
BUN: 17 mg/dL (ref 8–23)
CO2: 22 mmol/L (ref 22–32)
Calcium: 10.4 mg/dL — ABNORMAL HIGH (ref 8.9–10.3)
Chloride: 103 mmol/L (ref 98–111)
Creatinine, Ser: 1.2 mg/dL (ref 0.61–1.24)
GFR, Estimated: >60 mL/min (ref >60)
Glucose, Bld: 98 mg/dL (ref 70–99)
Potassium: 4.1 mmol/L (ref 3.5–5.1)
Sodium: 139 mmol/L (ref 135–145)

## 2023-10-04 LAB — CBC WITH DIFFERENTIAL/PLATELET
Abs Immature Granulocytes: 0.01 K/uL (ref 0.00–0.07)
Basophils Absolute: 0.1 K/uL (ref 0.0–0.1)
Basophils Relative: 1 %
Eosinophils Absolute: 0.1 K/uL (ref 0.0–0.5)
Eosinophils Relative: 2 %
HCT: 40 % (ref 39.0–52.0)
Hemoglobin: 14 g/dL (ref 13.0–17.0)
Immature Granulocytes: 0 %
Lymphocytes Relative: 28 %
Lymphs Abs: 1.6 K/uL (ref 0.7–4.0)
MCH: 32.9 pg (ref 26.0–34.0)
MCHC: 35 g/dL (ref 30.0–36.0)
MCV: 94.1 fL (ref 80.0–100.0)
Monocytes Absolute: 0.7 K/uL (ref 0.1–1.0)
Monocytes Relative: 12 %
Neutro Abs: 3.1 K/uL (ref 1.7–7.7)
Neutrophils Relative %: 57 %
Platelets: 183 K/uL (ref 150–400)
RBC: 4.25 MIL/uL (ref 4.22–5.81)
RDW: 13 % (ref 11.5–15.5)
WBC: 5.6 K/uL (ref 4.0–10.5)
nRBC: 0 % (ref 0.0–0.2)

## 2023-10-04 LAB — TROPONIN T, HIGH SENSITIVITY
Troponin T High Sensitivity: 21 ng/L — ABNORMAL HIGH (ref 0–19)
Troponin T High Sensitivity: 20 ng/L — ABNORMAL HIGH (ref 0–19)

## 2023-10-04 MED ORDER — HYDRALAZINE HCL 20 MG/ML IJ SOLN
10.0000 mg | Freq: Once | INTRAMUSCULAR | Status: AC
  Administered 2023-10-04: 10 mg via INTRAVENOUS
  Filled 2023-10-04: qty 1

## 2023-10-04 MED ORDER — AMLODIPINE BESYLATE 5 MG PO TABS: 5.0000 mg | ORAL_TABLET | Freq: Every day | ORAL | 0 refills | Status: DC

## 2023-10-04 MED ORDER — AMLODIPINE BESYLATE 5 MG PO TABS
5.0000 mg | ORAL_TABLET | Freq: Once | ORAL | Status: AC
  Administered 2023-10-04: 5 mg via ORAL
  Filled 2023-10-04: qty 1

## 2023-10-04 NOTE — ED Triage Notes (Signed)
 Pt states that he checked his BP at home and it was in the 170's over 90's.  No CP or sob.  Pt is here due to continued HTN, he takes his BP meds as ordered. Pt has slight pain in left shoulder with movement

## 2023-10-04 NOTE — ED Notes (Signed)
 Pt express left arm is hurting due to BP cuff that was placed there. BP cuff no longer on the left arm. Pt express there is still pain on that arm and requesting the doctor to look at it. RN was notified.

## 2023-10-04 NOTE — Discharge Instructions (Signed)
 Follow-up with your primary care doctor.  Follow-up with cardiology.  Return if symptoms worsen.  Start amlodipine  as prescribed.  Take your next dose tomorrow.  Monitor your blood pressure at home.  Return if you develop severe chest pain or stroke symptoms as we discussed.  Return if any other concerning symptoms.

## 2023-10-04 NOTE — ED Notes (Signed)
Provider in at bedside at this time.

## 2023-10-04 NOTE — Telephone Encounter (Signed)
 FYI Only or Action Required?: fy Patient was last seen in primary care on 07/29/2023 by Jolinda Norene HERO, DO.  Called Nurse Triage reporting Hypertension.  Symptoms began several months ago.  Interventions attempted: Prescription medications: Losartan .  Symptoms are: gradually worsening.  Triage Disposition: Go to ED Now (Notify PCP)  Patient/caregiver understands and will follow disposition?: Yes

## 2023-10-04 NOTE — ED Provider Notes (Signed)
 [facility] AT [facility] Provider Note   CSN: [id number] Arrival date & time: 10/04/23  1053     Patient presents with: Hypertension   Eric Cook is a 78 y.o. male.   Patient here with concern for high blood pressure.  He is noticed at high here recently.  Some recent medication changes.  Patient denies any chest pain shortness of breath weakness numbness tingling.  No stroke symptoms.  Patient did have some discomfort in his left shoulder this morning but he has chronic shoulder pain bilaterally.  He denies any symptoms at this time.  Blood pressure has been managed by his primary care doctor and cardiology at times.  No headache no vision loss.  The history is provided by the patient.       Prior to Admission medications   Medication Sig Start Date End Date Taking? Authorizing Provider  amLODipine  (NORVASC ) 5 MG tablet Take 1 tablet (5 mg total) by mouth daily. 10/04/23  Yes Blen Ransome, DO  acetaminophen  (TYLENOL ) 500 MG tablet Take 500 mg by mouth as needed for moderate pain or mild pain (As Needed).    [provider]  cetirizine  (ZYRTEC ) 10 MG tablet Take 0.5 tablets (5 mg total) by mouth daily. Patient not taking: Reported on 09/02/2023 10/02/22   Cathlene Marry Lenis, FNP  cimetidine  (TAGAMET ) 400 MG tablet TAKE 1 TABLET TWICE DAILY AS NEEDED 07/15/23   Jolinda Potter M, DO  diclofenac (VOLTAREN) 75 MG EC tablet Take 75 mg by mouth 2 (two) times daily. 05/29/23   [provider]  dicyclomine  (BENTYL ) 10 MG capsule Take 2 capsules (20 mg total) by mouth 3 (three) times daily before meals. TAKE 2 CAPSULES (20 MG) 15 MINUTES BEFORE MEALS AS NEEDED; 08/09/23   Abran Norleen SAILOR, MD  fluticasone  (FLONASE ) 50 MCG/ACT nasal spray USE 2 SPRAYS IN EACH NOSTRIL EVERY DAY Patient taking differently: Place 2 sprays into both nostrils daily as needed for allergies. 08/13/22   Jolinda Potter HERO, DO  fluticasone  furoate-vilanterol (BREO  ELLIPTA) 200-25 MCG/ACT AEPB Inhale 1 puff into the lungs daily. 12/03/22   Hunsucker, Donnice JONELLE, MD  furosemide  (LASIX ) 20 MG tablet TAKE 1 TABLET (20 MG TOTAL) BY MOUTH DAILY AS NEEDED FOR FLUID. 01/09/23   Jolinda Potter M, DO  gabapentin (NEURONTIN) 300 MG capsule Take 300 mg by mouth 2 (two) times daily. 03/27/23   [provider]  levETIRAcetam  (KEPPRA  XR) 500 MG 24 hr tablet Take 1 tablet (500 mg total) by mouth at bedtime. 02/20/23   Whitfield Raisin, NP  losartan  (COZAAR ) 100 MG tablet Take 1 tablet (100 mg total) by mouth daily. 07/29/23   Jolinda Potter HERO, DO  ondansetron  (ZOFRAN -ODT) 4 MG disintegrating tablet Take 1 tablet (4 mg total) by mouth every 8 (eight) hours as needed for nausea or vomiting. 07/01/23   Jolinda Potter HERO, DO  pantoprazole  (PROTONIX ) 40 MG tablet Take 1 tablet (40 mg total) by mouth daily. 02/13/23   Abran Norleen SAILOR, MD  sertraline  (ZOLOFT ) 50 MG tablet TAKE 1 TABLET BY MOUTH EVERY DAY 06/04/23   Lavell Lye A, FNP  simvastatin  (ZOCOR ) 20 MG tablet TAKE 1 TABLET EVERY DAY AT 6 PM 08/15/23   Jolinda Potter M, DO  tamsulosin  (FLOMAX ) 0.4 MG CAPS capsule TAKE 1 CAPSULE EVERY DAY 07/29/23   Jolinda Potter M, DO  traZODone  (DESYREL ) 50 MG tablet TAKE 0.5-1 TABLETS BY MOUTH AT BEDTIME AS NEEDED FOR SLEEP. 11/21/22   Jolinda Potter HERO,  DO    Allergies: Patient has no known allergies.    Review of Systems  Updated Vital Signs BP (!) 191/76   Pulse 80   Temp 97.7 F (36.5 C) (Oral)   Resp 19   SpO2 100%   Physical Exam Vitals and nursing note reviewed.  Constitutional:      General: He is not in acute distress.    Appearance: He is well-developed. He is not ill-appearing.  HENT:     Head: Normocephalic and atraumatic.     Nose: Nose normal.     Mouth/Throat:     Mouth: Mucous membranes are moist.  Eyes:     Extraocular Movements: Extraocular movements intact.     Conjunctiva/sclera: Conjunctivae normal.     Pupils: Pupils are equal, round,  and reactive to light.  Cardiovascular:     Rate and Rhythm: Normal rate and regular rhythm.     Pulses: Normal pulses.     Heart sounds: Normal heart sounds. No murmur heard. Pulmonary:     Effort: Pulmonary effort is normal. No respiratory distress.     Breath sounds: Normal breath sounds.  Abdominal:     Palpations: Abdomen is soft.     Tenderness: There is no abdominal tenderness.  Musculoskeletal:        General: No swelling.     Cervical back: Normal range of motion and neck supple.  Skin:    General: Skin is warm and dry.     Capillary Refill: Capillary refill takes less than 2 seconds.  Neurological:     General: No focal deficit present.     Mental Status: He is alert and oriented to person, place, and time.     Cranial Nerves: No cranial nerve deficit.     Sensory: No sensory deficit.     Motor: No weakness.     Coordination: Coordination normal.     Comments: 5+ out of 5 strength throughout, normal sensation, no drift, normal finger-nose-finger, normal speech normal coordination  Psychiatric:        Mood and Affect: Mood normal.     (all labs ordered are listed, but only abnormal results are displayed) Labs Reviewed  BASIC METABOLIC PANEL WITH GFR - Abnormal; Notable for the following components:      Result Value   Calcium  10.4 (*)    All other components within normal limits  TROPONIN T, HIGH SENSITIVITY - Abnormal; Notable for the following components:   Troponin T High Sensitivity 21 (*)    All other components within normal limits  TROPONIN T, HIGH SENSITIVITY - Abnormal; Notable for the following components:   Troponin T High Sensitivity 20 (*)    All other components within normal limits  CBC WITH DIFFERENTIAL/PLATELET    EKG: EKG Interpretation Date/Time:  Friday October 04 2023 12:02:12 EDT Ventricular Rate:  76 PR Interval:  144 QRS Duration:  88 QT Interval:  404 QTC Calculation: 454 R Axis:   -35  Text Interpretation: Normal sinus rhythm  When compared with ECG of 09-Aug-2022 17:17, PREVIOUS ECG IS PRESENT Confirmed by Ruthe Cornet [phone]) on 10/04/2023 12:04:48 PM  Radiology: [facility] Chest 2 View Result Date: 10/04/2023 CLINICAL DATA:  Pain EXAM: DG CHEST 2V COMPARISON:  August 09, 2022 FINDINGS: The heart size and mediastinal contours are within normal limits. Aorta is prominent and calcified. Increased lung volumes suggestive of COPD. Increased interstitial marking of both lung fields. No new pulmonary opacity or nodule. The visualized skeletal structures are unremarkable.No pleural  effusion or pneumothorax. IMPRESSION: No active cardiopulmonary disease. Electronically Signed   By: Megan  Zare M.D.   On: 10/04/2023 11:44     Procedures   Medications Ordered in the ED  hydrALAZINE  (APRESOLINE ) injection 10 mg (10 mg Intravenous Given 10/04/23 1258)  amLODipine  (NORVASC ) tablet 5 mg (5 mg Oral Given 10/04/23 1258)                                    Medical Decision Making Amount and/or Complexity of Data Reviewed Labs: ordered. Radiology: ordered.  Risk Prescription drug management.   Eric Cook is here for high blood pressure.  Blood pressure is 186/88.  He has no chest pain or stroke symptoms.  He has somewhat chronic pain in his shoulders at times.  He had some discomfort in his left shoulder this morning but now resolved.  EKG shows sinus rhythm and no ischemic changes per my review and interpretation.  Patient has no history of ACS.Patient here he has a normal neurological exam.  History of high cholesterol hypertension and anxiety.  He was worried about his blood pressure being elevated today.  There is been some recent changes to his blood pressure medications.  He is asymptomatic.  He is well-appearing.  Will check basic labs and troponin to see if there is any signs of any endorgan damage.  I have very low suspicion for ACS or any other emergent process.  Have no concern for dissection.  He is comfortable.  Will give  him a dose of hydralazine  and amlodipine  here.  Per my review and interpretation of labs there is no significant leukocytosis anemia or electrolyte abnormality.  No kidney injury.  Troponin is 21.  Chest x-ray shows no evidence of pneumonia or pneumothorax.  Will repeat troponin.  Overall nonspecific elevation.  He is not having any chest pain or cardiac symptoms at this time.  Suspect may be mildly elevated due to demand from high blood pressure.  Will make sure troponin is stable will add amlodipine  to his blood pressure meds.  Repeat troponin stable at 20.  He is not having any chest pain or stroke symptoms.  Will start him on amlodipine .  He was given the first dose of amlodipine  here in the ED.  Will have him follow-up with cardiology outpatient was given referral.  Recommend close follow-up with primary care doctor for recheck of his blood pressures.  Continue blood pressure journal at home.  Discharged in good condition.  No concern for acute process at this time.  This chart was dictated using voice recognition software.  Despite best efforts to proofread,  errors can occur which can change the documentation meaning.       Final diagnoses:  Secondary hypertension    ED Discharge Orders          Ordered    Ambulatory referral to Cardiology       Comments: If you have not heard from the Cardiology office within the next 72 hours please call [phone].   10/04/23 1423    amLODipine  (NORVASC ) 5 MG tablet  Daily        10/04/23 1423               Ruthe Cornet, DO 10/04/23 1424

## 2023-10-04 NOTE — ED Notes (Signed)
 Patient transported to X-ray

## 2023-10-04 NOTE — Telephone Encounter (Signed)
 Checked chart patient went to ED fyi noted.

## 2023-10-04 NOTE — Telephone Encounter (Signed)
 Reason for Disposition . [1] Systolic BP >= 160 OR Diastolic >= 100 AND [2] cardiac (e.g., breathing difficulty, chest pain) or neurologic symptoms (e.g., new-onset blurred or double vision, unsteady gait)  Answer Assessment - Initial Assessment Questions I feel weird.  RN advised ED. Pt stated I'll take a bath and get down there.     1. BLOOD PRESSURE: What is your blood pressure? Did you take at least two measurements 5 minutes apart?     174/102; 166/85 2. ONSET: When did you take your blood pressure?     This morning  3. HOW: How did you take your blood pressure? (e.g., automatic home BP monitor, visiting nurse)     automatic 4. HISTORY: Do you have a history of high blood pressure?     yes 5. MEDICINES: Are you taking any medicines for blood pressure? Have you missed any doses recently?     Losartan  6. OTHER SYMPTOMS: Do you have any symptoms? (e.g., blurred vision, chest pain, difficulty breathing, headache, weakness)     headache  Protocols used: Blood Pressure - High-A-AH

## 2023-10-05 ENCOUNTER — Other Ambulatory Visit: Payer: Self-pay

## 2023-10-05 ENCOUNTER — Encounter (HOSPITAL_COMMUNITY): Payer: Self-pay | Admitting: *Deleted

## 2023-10-05 ENCOUNTER — Emergency Department (HOSPITAL_COMMUNITY)

## 2023-10-05 ENCOUNTER — Inpatient Hospital Stay (HOSPITAL_COMMUNITY)
Admission: EM | Admit: 2023-10-05 | Discharge: 2023-10-09 | DRG: 069 | Disposition: A | Discharge status: Home Health | Attending: Internal Medicine | Admitting: Internal Medicine

## 2023-10-05 DIAGNOSIS — Z791 Long term (current) use of non-steroidal anti-inflammatories (NSAID): Secondary | ICD-10-CM

## 2023-10-05 DIAGNOSIS — E872 Acidosis, unspecified: Secondary | ICD-10-CM | POA: Diagnosis present

## 2023-10-05 DIAGNOSIS — M4854XA Collapsed vertebra, not elsewhere classified, thoracic region, initial encounter for fracture: Secondary | ICD-10-CM | POA: Diagnosis present

## 2023-10-05 DIAGNOSIS — Z6830 Body mass index (BMI) 30.0-30.9, adult: Secondary | ICD-10-CM

## 2023-10-05 DIAGNOSIS — M4856XA Collapsed vertebra, not elsewhere classified, lumbar region, initial encounter for fracture: Secondary | ICD-10-CM | POA: Diagnosis present

## 2023-10-05 DIAGNOSIS — D72829 Elevated white blood cell count, unspecified: Secondary | ICD-10-CM | POA: Diagnosis present

## 2023-10-05 DIAGNOSIS — F419 Anxiety disorder, unspecified: Secondary | ICD-10-CM | POA: Diagnosis present

## 2023-10-05 DIAGNOSIS — S32030A Wedge compression fracture of third lumbar vertebra, initial encounter for closed fracture: Secondary | ICD-10-CM | POA: Diagnosis present

## 2023-10-05 DIAGNOSIS — G319 Degenerative disease of nervous system, unspecified: Secondary | ICD-10-CM | POA: Diagnosis present

## 2023-10-05 DIAGNOSIS — R29818 Other symptoms and signs involving the nervous system: Secondary | ICD-10-CM | POA: Diagnosis not present

## 2023-10-05 DIAGNOSIS — R471 Dysarthria and anarthria: Secondary | ICD-10-CM | POA: Diagnosis present

## 2023-10-05 DIAGNOSIS — E119 Type 2 diabetes mellitus without complications: Secondary | ICD-10-CM | POA: Diagnosis present

## 2023-10-05 DIAGNOSIS — R112 Nausea with vomiting, unspecified: Secondary | ICD-10-CM

## 2023-10-05 DIAGNOSIS — R4182 Altered mental status, unspecified: Secondary | ICD-10-CM | POA: Diagnosis present

## 2023-10-05 DIAGNOSIS — I160 Hypertensive urgency: Secondary | ICD-10-CM | POA: Diagnosis present

## 2023-10-05 DIAGNOSIS — R479 Unspecified speech disturbances: Secondary | ICD-10-CM | POA: Diagnosis present

## 2023-10-05 DIAGNOSIS — R519 Headache, unspecified: Secondary | ICD-10-CM | POA: Diagnosis not present

## 2023-10-05 DIAGNOSIS — I493 Ventricular premature depolarization: Secondary | ICD-10-CM | POA: Diagnosis present

## 2023-10-05 DIAGNOSIS — I5032 Chronic diastolic (congestive) heart failure: Secondary | ICD-10-CM | POA: Diagnosis present

## 2023-10-05 DIAGNOSIS — G8929 Other chronic pain: Secondary | ICD-10-CM | POA: Diagnosis present

## 2023-10-05 DIAGNOSIS — G40909 Epilepsy, unspecified, not intractable, without status epilepticus: Secondary | ICD-10-CM | POA: Diagnosis present

## 2023-10-05 DIAGNOSIS — I10 Essential (primary) hypertension: Secondary | ICD-10-CM | POA: Diagnosis present

## 2023-10-05 DIAGNOSIS — I6523 Occlusion and stenosis of bilateral carotid arteries: Secondary | ICD-10-CM | POA: Diagnosis present

## 2023-10-05 DIAGNOSIS — Z87891 Personal history of nicotine dependence: Secondary | ICD-10-CM

## 2023-10-05 DIAGNOSIS — R4701 Aphasia: Secondary | ICD-10-CM | POA: Diagnosis present

## 2023-10-05 DIAGNOSIS — N400 Enlarged prostate without lower urinary tract symptoms: Secondary | ICD-10-CM | POA: Diagnosis present

## 2023-10-05 DIAGNOSIS — G9341 Metabolic encephalopathy: Secondary | ICD-10-CM | POA: Diagnosis present

## 2023-10-05 DIAGNOSIS — J9601 Acute respiratory failure with hypoxia: Secondary | ICD-10-CM | POA: Diagnosis present

## 2023-10-05 DIAGNOSIS — R2990 Unspecified symptoms and signs involving the nervous system: Secondary | ICD-10-CM | POA: Diagnosis present

## 2023-10-05 DIAGNOSIS — Z79899 Other long term (current) drug therapy: Secondary | ICD-10-CM

## 2023-10-05 DIAGNOSIS — Z7982 Long term (current) use of aspirin: Secondary | ICD-10-CM

## 2023-10-05 DIAGNOSIS — Z7902 Long term (current) use of antithrombotics/antiplatelets: Secondary | ICD-10-CM

## 2023-10-05 DIAGNOSIS — K219 Gastro-esophageal reflux disease without esophagitis: Secondary | ICD-10-CM | POA: Diagnosis present

## 2023-10-05 DIAGNOSIS — G459 Transient cerebral ischemic attack, unspecified: Principal | ICD-10-CM | POA: Diagnosis present

## 2023-10-05 DIAGNOSIS — F05 Delirium due to known physiological condition: Secondary | ICD-10-CM | POA: Diagnosis not present

## 2023-10-05 DIAGNOSIS — I110 Hypertensive heart disease with heart failure: Secondary | ICD-10-CM | POA: Diagnosis present

## 2023-10-05 DIAGNOSIS — J449 Chronic obstructive pulmonary disease, unspecified: Secondary | ICD-10-CM | POA: Diagnosis present

## 2023-10-05 DIAGNOSIS — F40240 Claustrophobia: Secondary | ICD-10-CM | POA: Diagnosis present

## 2023-10-05 DIAGNOSIS — R1013 Epigastric pain: Secondary | ICD-10-CM | POA: Diagnosis present

## 2023-10-05 DIAGNOSIS — R4702 Dysphasia: Secondary | ICD-10-CM | POA: Diagnosis present

## 2023-10-05 DIAGNOSIS — E785 Hyperlipidemia, unspecified: Secondary | ICD-10-CM | POA: Diagnosis present

## 2023-10-05 DIAGNOSIS — Z1152 Encounter for screening for COVID-19: Secondary | ICD-10-CM

## 2023-10-05 DIAGNOSIS — E66811 Obesity, class 1: Secondary | ICD-10-CM | POA: Diagnosis present

## 2023-10-05 DIAGNOSIS — Z8673 Personal history of transient ischemic attack (TIA), and cerebral infarction without residual deficits: Secondary | ICD-10-CM

## 2023-10-05 DIAGNOSIS — F39 Unspecified mood [affective] disorder: Secondary | ICD-10-CM | POA: Diagnosis present

## 2023-10-05 DIAGNOSIS — M549 Dorsalgia, unspecified: Secondary | ICD-10-CM | POA: Diagnosis present

## 2023-10-05 LAB — COMPREHENSIVE METABOLIC PANEL WITH GFR
ALT: 23 U/L (ref 0–44)
AST: 31 U/L (ref 15–41)
Albumin: 4.6 g/dL (ref 3.5–5.0)
Alkaline Phosphatase: 75 U/L (ref 38–126)
Anion gap: 16 — ABNORMAL HIGH (ref 5–15)
BUN: 21 mg/dL (ref 8–23)
CO2: 20 mmol/L — ABNORMAL LOW (ref 22–32)
Calcium: 9.4 mg/dL (ref 8.9–10.3)
Chloride: 104 mmol/L (ref 98–111)
Creatinine, Ser: 1.15 mg/dL (ref 0.61–1.24)
GFR, Estimated: >60 mL/min (ref >60)
Glucose, Bld: 124 mg/dL — ABNORMAL HIGH (ref 70–99)
Potassium: 3.6 mmol/L (ref 3.5–5.1)
Sodium: 140 mmol/L (ref 135–145)
Total Bilirubin: 1 mg/dL (ref 0.0–1.2)
Total Protein: 7.3 g/dL (ref 6.5–8.1)

## 2023-10-05 LAB — APTT: aPTT: 32 s (ref 24–36)

## 2023-10-05 LAB — CBC
HCT: 41.5 % (ref 39.0–52.0)
Hemoglobin: 14.2 g/dL (ref 13.0–17.0)
MCH: 32.7 pg (ref 26.0–34.0)
MCHC: 34.2 g/dL (ref 30.0–36.0)
MCV: 95.6 fL (ref 80.0–100.0)
Platelets: 222 K/uL (ref 150–400)
RBC: 4.34 MIL/uL (ref 4.22–5.81)
RDW: 13 % (ref 11.5–15.5)
WBC: 8 K/uL (ref 4.0–10.5)
nRBC: 0 % (ref 0.0–0.2)

## 2023-10-05 LAB — DIFFERENTIAL
Abs Immature Granulocytes: 0.04 K/uL (ref 0.00–0.07)
Basophils Absolute: 0.1 K/uL (ref 0.0–0.1)
Basophils Relative: 1 %
Eosinophils Absolute: 0.1 K/uL (ref 0.0–0.5)
Eosinophils Relative: 1 %
Immature Granulocytes: 1 %
Lymphocytes Relative: 15 %
Lymphs Abs: 1.2 K/uL (ref 0.7–4.0)
Monocytes Absolute: 0.9 K/uL (ref 0.1–1.0)
Monocytes Relative: 11 %
Neutro Abs: 5.8 K/uL (ref 1.7–7.7)
Neutrophils Relative %: 71 %

## 2023-10-05 LAB — I-STAT CHEM 8, ED
BUN: 23 mg/dL (ref 8–23)
Calcium, Ion: 1.13 mmol/L — ABNORMAL LOW (ref 1.15–1.40)
Chloride: 104 mmol/L (ref 98–111)
Creatinine, Ser: 1.2 mg/dL (ref 0.61–1.24)
Glucose, Bld: 124 mg/dL — ABNORMAL HIGH (ref 70–99)
HCT: 43 % (ref 39.0–52.0)
Hemoglobin: 14.6 g/dL (ref 13.0–17.0)
Potassium: 3.5 mmol/L (ref 3.5–5.1)
Sodium: 139 mmol/L (ref 135–145)
TCO2: 21 mmol/L — ABNORMAL LOW (ref 22–32)

## 2023-10-05 LAB — ETHANOL: Alcohol, Ethyl (B): <15 mg/dL (ref <15)

## 2023-10-05 LAB — PROTIME-INR
INR: 1 (ref 0.8–1.2)
Prothrombin Time: 14 s (ref 11.4–15.2)

## 2023-10-05 NOTE — ED Triage Notes (Signed)
 The pt has been confused intermittently for awhile  yesterday he drove himself to [facility]  and was seen there.  Today the pt called the son and told him that he had taken a nap and when he woke up he felt bad and tried to call his son and could not dial the phone.  His son reported that the pt has not been his self for awhile  pa saw here in triage

## 2023-10-05 NOTE — ED Provider Triage Note (Signed)
 Emergency Medicine Provider Triage Evaluation Note  Eric Cook , a 78 y.o. male  was evaluated in triage.  Pt complains of altered mental status. Reports that he was seen yesterday for hypertension and amlodipine  was added to his anti-hypertensive regimen. Reports that he was in good health today, laid down for a nap at around 2 pm today and then woke up around 4:30 pm with confusion. Reports that it took him over an hour to figure out how to dial the phone to call his son and tell him something was wrong. His son at bedside reports that over the phone his speech was slurred and he was speaking nonsensically, talking about errands he needed to run and Christmas presents. The son then drove to his house and brought him here. Reports that throughout the drive here today, his symptoms seemed to wax and wane. At one point he reportedly said he was dizzy and they pulled over as they were concerned he would vomit. He is unable to describe his dizziness, is unsure room spinning sensation vs lightheadedness vs other. Son does report he was able to walk to the truck to come here. Denies history of similar symptoms previously. Does have remote history of seizures, does not have any wounds on his tongue and did not void on himself. They deny history of similar symptoms previously. He does report at one point he had a headache as well but this has since resolved. Denies vision changes, chest pain, shortness of breath, nausea, vomiting, diarrhea, or abdominal pain.   Review of Systems  Positive:  Negative:   Physical Exam  BP (!) 158/80 (BP Location: Right Arm)   Pulse 89   Temp 98.3 F (36.8 C) (Oral)   Resp 20   SpO2 94%  Gen:   Awake, no distress   Resp:  Normal effort  MSK:   Moves extremities without difficulty  Other:  PERRLA, EOMs intact, no facial droop or weakness, able to perform finger to nose testing, does have some confusion with following commands but on repeat instruction is able to. Alert  to self and place, tells me definitively that it is 1925, is able to tell me that it is September.   Medical Decision Making  Medically screening exam initiated at 8:00 PM.  Appropriate orders placed.  Eric Cook was informed that the remainder of the evaluation will be completed by another provider, this initial triage assessment does not replace that evaluation, and the importance of remaining in the ED until their evaluation is complete.  Last known well is 2 pm today, does not have specific focal deficits, no unilateral weakness, out of TNK window, LVO negative. No code stroke activation at this time. Work-up initiated   Errin Whitelaw A, PA-C 10/05/23 2014

## 2023-10-06 ENCOUNTER — Inpatient Hospital Stay (HOSPITAL_COMMUNITY)

## 2023-10-06 ENCOUNTER — Emergency Department (HOSPITAL_COMMUNITY)

## 2023-10-06 DIAGNOSIS — I110 Hypertensive heart disease with heart failure: Secondary | ICD-10-CM | POA: Diagnosis present

## 2023-10-06 DIAGNOSIS — K219 Gastro-esophageal reflux disease without esophagitis: Secondary | ICD-10-CM | POA: Diagnosis present

## 2023-10-06 DIAGNOSIS — R140 Abdominal distension (gaseous): Secondary | ICD-10-CM | POA: Diagnosis not present

## 2023-10-06 DIAGNOSIS — N390 Urinary tract infection, site not specified: Secondary | ICD-10-CM

## 2023-10-06 DIAGNOSIS — R569 Unspecified convulsions: Secondary | ICD-10-CM | POA: Diagnosis not present

## 2023-10-06 DIAGNOSIS — I160 Hypertensive urgency: Secondary | ICD-10-CM | POA: Diagnosis present

## 2023-10-06 DIAGNOSIS — R112 Nausea with vomiting, unspecified: Secondary | ICD-10-CM

## 2023-10-06 DIAGNOSIS — E872 Acidosis, unspecified: Secondary | ICD-10-CM | POA: Diagnosis present

## 2023-10-06 DIAGNOSIS — K6389 Other specified diseases of intestine: Secondary | ICD-10-CM | POA: Diagnosis not present

## 2023-10-06 DIAGNOSIS — Z79899 Other long term (current) drug therapy: Secondary | ICD-10-CM | POA: Diagnosis not present

## 2023-10-06 DIAGNOSIS — G319 Degenerative disease of nervous system, unspecified: Secondary | ICD-10-CM | POA: Diagnosis present

## 2023-10-06 DIAGNOSIS — I10 Essential (primary) hypertension: Secondary | ICD-10-CM | POA: Diagnosis not present

## 2023-10-06 DIAGNOSIS — R2990 Unspecified symptoms and signs involving the nervous system: Secondary | ICD-10-CM | POA: Diagnosis not present

## 2023-10-06 DIAGNOSIS — G9341 Metabolic encephalopathy: Secondary | ICD-10-CM | POA: Diagnosis not present

## 2023-10-06 DIAGNOSIS — R4701 Aphasia: Secondary | ICD-10-CM | POA: Diagnosis present

## 2023-10-06 DIAGNOSIS — I779 Disorder of arteries and arterioles, unspecified: Secondary | ICD-10-CM | POA: Diagnosis not present

## 2023-10-06 DIAGNOSIS — F39 Unspecified mood [affective] disorder: Secondary | ICD-10-CM | POA: Diagnosis present

## 2023-10-06 DIAGNOSIS — J9601 Acute respiratory failure with hypoxia: Secondary | ICD-10-CM | POA: Diagnosis present

## 2023-10-06 DIAGNOSIS — Z87891 Personal history of nicotine dependence: Secondary | ICD-10-CM | POA: Diagnosis not present

## 2023-10-06 DIAGNOSIS — G40909 Epilepsy, unspecified, not intractable, without status epilepticus: Secondary | ICD-10-CM | POA: Diagnosis present

## 2023-10-06 DIAGNOSIS — I6523 Occlusion and stenosis of bilateral carotid arteries: Secondary | ICD-10-CM | POA: Diagnosis not present

## 2023-10-06 DIAGNOSIS — R4182 Altered mental status, unspecified: Secondary | ICD-10-CM | POA: Diagnosis not present

## 2023-10-06 DIAGNOSIS — G459 Transient cerebral ischemic attack, unspecified: Secondary | ICD-10-CM

## 2023-10-06 DIAGNOSIS — F05 Delirium due to known physiological condition: Secondary | ICD-10-CM | POA: Diagnosis not present

## 2023-10-06 DIAGNOSIS — E785 Hyperlipidemia, unspecified: Secondary | ICD-10-CM | POA: Diagnosis present

## 2023-10-06 DIAGNOSIS — Z1152 Encounter for screening for COVID-19: Secondary | ICD-10-CM | POA: Diagnosis not present

## 2023-10-06 DIAGNOSIS — R1110 Vomiting, unspecified: Secondary | ICD-10-CM | POA: Diagnosis not present

## 2023-10-06 DIAGNOSIS — M4854XA Collapsed vertebra, not elsewhere classified, thoracic region, initial encounter for fracture: Secondary | ICD-10-CM | POA: Diagnosis present

## 2023-10-06 DIAGNOSIS — N400 Enlarged prostate without lower urinary tract symptoms: Secondary | ICD-10-CM | POA: Diagnosis present

## 2023-10-06 DIAGNOSIS — D72829 Elevated white blood cell count, unspecified: Secondary | ICD-10-CM | POA: Diagnosis present

## 2023-10-06 DIAGNOSIS — J449 Chronic obstructive pulmonary disease, unspecified: Secondary | ICD-10-CM | POA: Diagnosis present

## 2023-10-06 DIAGNOSIS — E119 Type 2 diabetes mellitus without complications: Secondary | ICD-10-CM | POA: Diagnosis present

## 2023-10-06 DIAGNOSIS — Z6830 Body mass index (BMI) 30.0-30.9, adult: Secondary | ICD-10-CM | POA: Diagnosis not present

## 2023-10-06 DIAGNOSIS — M4856XA Collapsed vertebra, not elsewhere classified, lumbar region, initial encounter for fracture: Secondary | ICD-10-CM | POA: Diagnosis present

## 2023-10-06 DIAGNOSIS — I5032 Chronic diastolic (congestive) heart failure: Secondary | ICD-10-CM | POA: Diagnosis present

## 2023-10-06 LAB — RAPID URINE DRUG SCREEN, HOSP PERFORMED
Amphetamines: NOT DETECTED
Barbiturates: NOT DETECTED
Benzodiazepines: NOT DETECTED
Cocaine: NOT DETECTED
Opiates: NOT DETECTED
Tetrahydrocannabinol: NOT DETECTED

## 2023-10-06 LAB — CBC
HCT: 42.2 % (ref 39.0–52.0)
Hemoglobin: 14.3 g/dL (ref 13.0–17.0)
MCH: 32.4 pg (ref 26.0–34.0)
MCHC: 33.9 g/dL (ref 30.0–36.0)
MCV: 95.5 fL (ref 80.0–100.0)
Platelets: 215 K/uL (ref 150–400)
RBC: 4.42 MIL/uL (ref 4.22–5.81)
RDW: 13.2 % (ref 11.5–15.5)
WBC: 11.2 K/uL — ABNORMAL HIGH (ref 4.0–10.5)
nRBC: 0 % (ref 0.0–0.2)

## 2023-10-06 LAB — MAGNESIUM: Magnesium: 2.1 mg/dL (ref 1.7–2.4)

## 2023-10-06 LAB — LACTIC ACID, PLASMA: Lactic Acid, Venous: 1.5 mmol/L (ref 0.5–1.9)

## 2023-10-06 LAB — LIPID PANEL
Cholesterol: 158 mg/dL (ref 0–200)
HDL: 33 mg/dL — ABNORMAL LOW (ref >40)
LDL Cholesterol: 107 mg/dL — ABNORMAL HIGH (ref 0–99)
Total CHOL/HDL Ratio: 4.8 ratio
Triglycerides: 89 mg/dL (ref <150)
VLDL: 18 mg/dL (ref 0–40)

## 2023-10-06 LAB — URINALYSIS, ROUTINE W REFLEX MICROSCOPIC
Bilirubin Urine: NEGATIVE
Glucose, UA: NEGATIVE mg/dL
Hgb urine dipstick: NEGATIVE
Ketones, ur: NEGATIVE mg/dL
Leukocytes,Ua: NEGATIVE
Nitrite: NEGATIVE
Protein, ur: NEGATIVE mg/dL
Specific Gravity, Urine: 1.028 (ref 1.005–1.030)
pH: 5 (ref 5.0–8.0)

## 2023-10-06 LAB — BASIC METABOLIC PANEL WITH GFR
Anion gap: 10 (ref 5–15)
BUN: 22 mg/dL (ref 8–23)
CO2: 25 mmol/L (ref 22–32)
Calcium: 9 mg/dL (ref 8.9–10.3)
Chloride: 102 mmol/L (ref 98–111)
Creatinine, Ser: 1.05 mg/dL (ref 0.61–1.24)
GFR, Estimated: >60 mL/min (ref >60)
Glucose, Bld: 136 mg/dL — ABNORMAL HIGH (ref 70–99)
Potassium: 3.6 mmol/L (ref 3.5–5.1)
Sodium: 137 mmol/L (ref 135–145)

## 2023-10-06 LAB — HEMOGLOBIN A1C
Hgb A1c MFr Bld: 5 % (ref 4.8–5.6)
Mean Plasma Glucose: 96.8 mg/dL

## 2023-10-06 LAB — TSH: TSH: 1.688 u[IU]/mL (ref 0.350–4.500)

## 2023-10-06 LAB — RESP PANEL BY RT-PCR (RSV, FLU A&B, COVID)  RVPGX2
Influenza A by PCR: NEGATIVE
Influenza B by PCR: NEGATIVE
Resp Syncytial Virus by PCR: NEGATIVE
SARS Coronavirus 2 by RT PCR: NEGATIVE

## 2023-10-06 LAB — TROPONIN I (HIGH SENSITIVITY)
Troponin I (High Sensitivity): 5 ng/L (ref <18)
Troponin I (High Sensitivity): 4 ng/L (ref <18)

## 2023-10-06 LAB — PHOSPHORUS: Phosphorus: 3.9 mg/dL (ref 2.5–4.6)

## 2023-10-06 MED ORDER — TAMSULOSIN HCL 0.4 MG PO CAPS
0.4000 mg | ORAL_CAPSULE | Freq: Every day | ORAL | Status: DC
  Administered 2023-10-08: 0.4 mg via ORAL
  Filled 2023-10-06 (×2): qty 1

## 2023-10-06 MED ORDER — IOHEXOL 350 MG/ML SOLN
75.0000 mL | Freq: Once | INTRAVENOUS | Status: AC | PRN
  Administered 2023-10-06: 75 mL via INTRAVENOUS

## 2023-10-06 MED ORDER — MELATONIN 3 MG PO TABS
6.0000 mg | ORAL_TABLET | Freq: Every evening | ORAL | Status: DC | PRN
  Administered 2023-10-06 – 2023-10-08 (×2): 6 mg via ORAL
  Filled 2023-10-06 (×2): qty 2

## 2023-10-06 MED ORDER — ALBUTEROL SULFATE (2.5 MG/3ML) 0.083% IN NEBU: 2.5000 mg | INHALATION_SOLUTION | RESPIRATORY_TRACT | Status: DC | PRN

## 2023-10-06 MED ORDER — SODIUM CHLORIDE 0.9 % IV SOLN: INTRAVENOUS | Status: DC

## 2023-10-06 MED ORDER — AMLODIPINE BESYLATE 5 MG PO TABS
5.0000 mg | ORAL_TABLET | Freq: Every day | ORAL | Status: DC
Start: 2023-10-06 — End: 2023-10-06

## 2023-10-06 MED ORDER — LEVALBUTEROL HCL 0.63 MG/3ML IN NEBU
0.6300 mg | INHALATION_SOLUTION | Freq: Four times a day (QID) | RESPIRATORY_TRACT | Status: DC | PRN
  Filled 2023-10-06: qty 3

## 2023-10-06 MED ORDER — CLOPIDOGREL BISULFATE 75 MG PO TABS
75.0000 mg | ORAL_TABLET | Freq: Every day | ORAL | Status: DC
  Administered 2023-10-08 – 2023-10-09 (×2): 75 mg via ORAL
  Filled 2023-10-06 (×3): qty 1

## 2023-10-06 MED ORDER — ONDANSETRON HCL 4 MG/2ML IJ SOLN
4.0000 mg | Freq: Once | INTRAMUSCULAR | Status: AC
  Administered 2023-10-06: 4 mg via INTRAVENOUS
  Filled 2023-10-06: qty 2

## 2023-10-06 MED ORDER — DIAZEPAM 5 MG/ML IJ SOLN
2.5000 mg | INTRAMUSCULAR | Status: DC
Start: 2023-10-07 — End: 2023-10-06
  Filled 2023-10-06: qty 2

## 2023-10-06 MED ORDER — POTASSIUM CHLORIDE 10 MEQ/100ML IV SOLN
10.0000 meq | INTRAVENOUS | Status: AC
  Administered 2023-10-06 (×4): 10 meq via INTRAVENOUS
  Filled 2023-10-06 (×4): qty 100

## 2023-10-06 MED ORDER — ONDANSETRON HCL 4 MG/2ML IJ SOLN: 4.0000 mg | Freq: Four times a day (QID) | INTRAMUSCULAR | Status: DC | PRN

## 2023-10-06 MED ORDER — ACETAMINOPHEN 500 MG PO TABS
1000.0000 mg | ORAL_TABLET | Freq: Four times a day (QID) | ORAL | Status: DC | PRN
  Administered 2023-10-06 – 2023-10-08 (×3): 1000 mg via ORAL
  Filled 2023-10-06 (×3): qty 2

## 2023-10-06 MED ORDER — SERTRALINE HCL 50 MG PO TABS
50.0000 mg | ORAL_TABLET | Freq: Every day | ORAL | Status: DC
  Administered 2023-10-08 – 2023-10-09 (×2): 50 mg via ORAL
  Filled 2023-10-06 (×3): qty 1

## 2023-10-06 MED ORDER — MORPHINE SULFATE (PF) 2 MG/ML IV SOLN
2.0000 mg | INTRAVENOUS | Status: DC | PRN
  Administered 2023-10-07: 2 mg via INTRAVENOUS
  Filled 2023-10-06 (×2): qty 1

## 2023-10-06 MED ORDER — ROSUVASTATIN CALCIUM 20 MG PO TABS
20.0000 mg | ORAL_TABLET | Freq: Every day | ORAL | Status: DC
  Administered 2023-10-06 – 2023-10-09 (×3): 20 mg via ORAL
  Filled 2023-10-06 (×4): qty 1

## 2023-10-06 MED ORDER — LACTATED RINGERS IV SOLN: INTRAVENOUS | Status: DC

## 2023-10-06 MED ORDER — METOCLOPRAMIDE HCL 5 MG/ML IJ SOLN
10.0000 mg | Freq: Once | INTRAMUSCULAR | Status: AC
  Administered 2023-10-06: 10 mg via INTRAVENOUS
  Filled 2023-10-06: qty 2

## 2023-10-06 MED ORDER — PANTOPRAZOLE SODIUM 40 MG IV SOLR
40.0000 mg | Freq: Two times a day (BID) | INTRAVENOUS | Status: DC
Start: 2023-10-06 — End: 2023-10-09
  Administered 2023-10-06 – 2023-10-09 (×7): 40 mg via INTRAVENOUS
  Filled 2023-10-06 (×7): qty 10

## 2023-10-06 MED ORDER — ENOXAPARIN SODIUM 40 MG/0.4ML IJ SOSY: 40.0000 mg | PREFILLED_SYRINGE | INTRAMUSCULAR | Status: DC

## 2023-10-06 MED ORDER — SIMVASTATIN 20 MG PO TABS
20.0000 mg | ORAL_TABLET | Freq: Every day | ORAL | Status: DC
Start: 2023-10-07 — End: 2023-10-06

## 2023-10-06 MED ORDER — DIAZEPAM 5 MG/ML IJ SOLN
2.5000 mg | Freq: Once | INTRAMUSCULAR | Status: AC
  Administered 2023-10-06: 2.5 mg via INTRAVENOUS
  Filled 2023-10-06: qty 2

## 2023-10-06 MED ORDER — POTASSIUM CHLORIDE CRYS ER 20 MEQ PO TBCR: 40.0000 meq | EXTENDED_RELEASE_TABLET | Freq: Once | ORAL | Status: DC

## 2023-10-06 MED ORDER — ASPIRIN 81 MG PO TBEC
81.0000 mg | DELAYED_RELEASE_TABLET | Freq: Every day | ORAL | Status: DC
  Administered 2023-10-08 – 2023-10-09 (×2): 81 mg via ORAL
  Filled 2023-10-06 (×3): qty 1

## 2023-10-06 MED ORDER — FAMOTIDINE 20 MG PO TABS: 20.0000 mg | ORAL_TABLET | Freq: Two times a day (BID) | ORAL | Status: DC | PRN

## 2023-10-06 MED ORDER — DIAZEPAM 5 MG/ML IJ SOLN
2.5000 mg | Freq: Four times a day (QID) | INTRAMUSCULAR | Status: DC | PRN
  Administered 2023-10-06: 2.5 mg via INTRAVENOUS

## 2023-10-06 MED ORDER — POLYETHYLENE GLYCOL 3350 17 G PO PACK: 17.0000 g | PACK | Freq: Every day | ORAL | Status: DC | PRN

## 2023-10-06 MED ORDER — SODIUM CHLORIDE 0.9% FLUSH
3.0000 mL | Freq: Two times a day (BID) | INTRAVENOUS | Status: DC
Start: 2023-10-06 — End: 2023-10-09
  Administered 2023-10-06 – 2023-10-09 (×5): 3 mL via INTRAVENOUS

## 2023-10-06 MED ORDER — METOCLOPRAMIDE HCL 5 MG/ML IJ SOLN
5.0000 mg | Freq: Once | INTRAMUSCULAR | Status: AC
  Administered 2023-10-06: 5 mg via INTRAVENOUS
  Filled 2023-10-06: qty 2

## 2023-10-06 MED ORDER — DIAZEPAM 2 MG PO TABS
2.0000 mg | ORAL_TABLET | Freq: Once | ORAL | Status: DC
  Filled 2023-10-06: qty 1

## 2023-10-06 MED ORDER — DIPHENHYDRAMINE HCL 50 MG/ML IJ SOLN
50.0000 mg | INTRAMUSCULAR | Status: AC
  Administered 2023-10-06: 50 mg via INTRAVENOUS
  Filled 2023-10-06: qty 1

## 2023-10-06 MED ORDER — ENSURE PLUS HIGH PROTEIN PO LIQD
237.0000 mL | Freq: Three times a day (TID) | ORAL | Status: DC
  Administered 2023-10-06 – 2023-10-09 (×3): 237 mL via ORAL
  Filled 2023-10-06: qty 237

## 2023-10-06 MED ORDER — PROCHLORPERAZINE EDISYLATE 10 MG/2ML IJ SOLN
10.0000 mg | Freq: Four times a day (QID) | INTRAMUSCULAR | Status: DC | PRN
  Administered 2023-10-06: 10 mg via INTRAVENOUS
  Filled 2023-10-06: qty 2

## 2023-10-06 MED ORDER — TRAZODONE HCL 50 MG PO TABS: 25.0000 mg | ORAL_TABLET | Freq: Every evening | ORAL | Status: DC | PRN

## 2023-10-06 MED ORDER — LEVETIRACETAM ER 500 MG PO TB24
500.0000 mg | ORAL_TABLET | Freq: Every day | ORAL | Status: DC
  Administered 2023-10-06 – 2023-10-08 (×3): 500 mg via ORAL
  Filled 2023-10-06 (×4): qty 1

## 2023-10-06 MED ORDER — STROKE: EARLY STAGES OF RECOVERY BOOK
Freq: Once | Status: AC
  Filled 2023-10-06: qty 1

## 2023-10-06 MED ORDER — LACTATED RINGERS IV BOLUS
1000.0000 mL | Freq: Once | INTRAVENOUS | Status: AC
  Administered 2023-10-06: 1000 mL via INTRAVENOUS

## 2023-10-06 NOTE — Consult Note (Addendum)
 NEUROLOGY CONSULT NOTE   Date of service: October 06, 2023 Patient Name: Eric Cook MRN:  [id number] DOB:  04-02-1945 Reason for Consultation: Confusion, dysphasia in the setting of uncontrolled HTN.  Requesting Provider: Franklyn Sid SAILOR, MD  History of Present Illness  Eric Cook is a 78 y.o. male with a PMHx as documented below (reviewed), who presents to the ED with uncontrolled HTN and an episode of confusion during which he could not operate his cellphone. His son also noted that his speech was nonsensical and slurred. Cognitive symptoms are now resolved. In addition to the speech deficit, he also had trouble using the fingers of his right hand to manipulate his phone, had double vision, had a right-sided headache and was with mild weakness on the right resulting in him listing to the right while ambulating. All of these symptoms are also now resolved.   HPI obtained by EDP has been reviewed: 78 year old male brought in by son. Patient reports history of difficulty controlling his blood pressure for several months, went to [facility] Friday for headaches and was dc. Patient was having his usual day today, woke up from a nap at 2pm and felt dizzy so he sat back down and fell back asleep. Patient woke up again about 30 minutes later and couldn't operate his cell phone, couldn't remember how to operate the phone.  He was eventually able to call his son.  Patient's son states that when he called it he was not making any sense, stating receipt, Christmas.  Son went to the patient's house and states by the time he arrived he was mentating a little bit better.  He was able to ambulate out to the car and brought him to the emergency room.  States that mental status has been back to baseline while he is been in the lobby waiting, has had intermittent right sided headaches, intermittent nausea which she attributes to indigestion.  He is also developed a cough today and occasional chills.  No  specific known sick contacts although was in the ER on Friday.  Initial BP in the ED was 176-182/82-89. His BP has improved spontaneously in the ED.   Home medications include Keppra  XR 500 mg po at bedtime. He was switched from losartan  to amlodipine  at his ED visit at [facility] yesterday for HTN.   ROS  Denies any seizure activity. Comprehensive ROS performed and other pertinent positives/negatives are documented in HPI    Past History   Past Medical History:  Diagnosis Date   Anxiety    Chronic back pain    lumbar   HLD (hyperlipidemia)    Hyperlipidemia    Hypertension    Peptic ulcer    Seizures (HCC)    medication related    Past Surgical History:  Procedure Laterality Date   LUMBAR DISC SURGERY     L3-L4   UPPER GASTROINTESTINAL ENDOSCOPY      Family History: Family History  Problem Relation Age of Onset   Colon polyps Brother 108   Colon cancer Brother    Colon polyps Son 83   Other Son        vertigo   Kidney Stones Son    Esophageal cancer Neg Hx    Pancreatic cancer Neg Hx    Stomach cancer Neg Hx    Rectal cancer Neg Hx     Social History  reports that he quit smoking about 4 years ago. His smoking use included cigarettes. He started  smoking about 58 years ago. He has a 54 pack-year smoking history. He has never used smokeless tobacco. He reports that he does not drink alcohol and does not use drugs.  No Known Allergies  Medications   Current Facility-Administered Medications:    methylPREDNISolone  acetate (DEPO-MEDROL ) injection 40 mg, 40 mg, Intramuscular, Once,   Current Outpatient Medications:    acetaminophen  (TYLENOL ) 500 MG tablet, Take 500 mg by mouth as needed for moderate pain or mild pain (As Needed)., Disp: , Rfl:    amLODipine  (NORVASC ) 5 MG tablet, Take 1 tablet (5 mg total) by mouth daily., Disp: 30 tablet, Rfl: 0   cimetidine  (TAGAMET ) 400 MG tablet, TAKE 1 TABLET TWICE DAILY AS NEEDED, Disp: 180 tablet, Rfl: 3   furosemide  (LASIX )  20 MG tablet, TAKE 1 TABLET (20 MG TOTAL) BY MOUTH DAILY AS NEEDED FOR FLUID., Disp: 90 tablet, Rfl: 0   levETIRAcetam  (KEPPRA  XR) 500 MG 24 hr tablet, Take 1 tablet (500 mg total) by mouth at bedtime., Disp: 90 tablet, Rfl: 3   meloxicam (MOBIC) 15 MG tablet, Take 15 mg by mouth daily., Disp: , Rfl:    sertraline  (ZOLOFT ) 50 MG tablet, TAKE 1 TABLET BY MOUTH EVERY DAY, Disp: 90 tablet, Rfl: 2   simvastatin  (ZOCOR ) 20 MG tablet, TAKE 1 TABLET EVERY DAY AT 6 PM, Disp: 90 tablet, Rfl: 1   tamsulosin  (FLOMAX ) 0.4 MG CAPS capsule, TAKE 1 CAPSULE EVERY DAY, Disp: 90 capsule, Rfl: 3   traZODone  (DESYREL ) 50 MG tablet, TAKE 0.5-1 TABLETS BY MOUTH AT BEDTIME AS NEEDED FOR SLEEP., Disp: 90 tablet, Rfl: 3   diclofenac (VOLTAREN) 75 MG EC tablet, Take 75 mg by mouth 2 (two) times daily. (Patient not taking: Reported on 10/06/2023), Disp: , Rfl:    dicyclomine  (BENTYL ) 10 MG capsule, Take 2 capsules (20 mg total) by mouth 3 (three) times daily before meals. TAKE 2 CAPSULES (20 MG) 15 MINUTES BEFORE MEALS AS NEEDED;, Disp: 270 capsule, Rfl: 1   fluticasone  (FLONASE ) 50 MCG/ACT nasal spray, USE 2 SPRAYS IN EACH NOSTRIL EVERY DAY (Patient not taking: Reported on 10/06/2023), Disp: 48 each, Rfl: 3   fluticasone  furoate-vilanterol (BREO ELLIPTA ) 200-25 MCG/ACT AEPB, Inhale 1 puff into the lungs daily. (Patient not taking: Reported on 10/06/2023), Disp: 60 each, Rfl: 11   gabapentin (NEURONTIN) 300 MG capsule, Take 300 mg by mouth 2 (two) times daily. (Patient not taking: Reported on 10/06/2023), Disp: , Rfl:    losartan  (COZAAR ) 100 MG tablet, Take 1 tablet (100 mg total) by mouth daily. (Patient not taking: Reported on 10/06/2023), Disp: 90 tablet, Rfl: 4   ondansetron  (ZOFRAN -ODT) 4 MG disintegrating tablet, Take 1 tablet (4 mg total) by mouth every 8 (eight) hours as needed for nausea or vomiting. (Patient not taking: Reported on 10/06/2023), Disp: 20 tablet, Rfl: 0   pantoprazole  (PROTONIX ) 40 MG tablet, Take 1 tablet  (40 mg total) by mouth daily. (Patient not taking: Reported on 10/06/2023), Disp: 90 tablet, Rfl: 3  Vitals   Vitals:   10/05/23 2007 10/06/23 0015 10/06/23 0235 10/06/23 0447  BP:  (!) 176/82 (!) 144/90   Pulse:  85 85   Resp:  20 (!) 24   Temp:  98.1 F (36.7 C)  98.4 F (36.9 C)  TempSrc:  Oral  Oral  SpO2:  94% 91%   Weight: 93 kg     Height: 5' 9 (1.753 m)       Body mass index is 30.28 kg/m.   Physical Exam  Constitutional: Appears well-developed and well-nourished.  Psych: Affect appropriate to situation.  Eyes: No scleral injection.  HENT: No OP obstruction.  Head: Normocephalic.  Respiratory: Effort normal, non-labored breathing.    Neurologic Examination   Mental Status: Alert, oriented x 5, thought content appropriate.  Speech fluent with intact naming, comprehension and repetition.  Able to follow all commands without difficulty. No dysarthria.  Cranial Nerves: II: Temporal visual fields intact with no extinction to DSS. PERRL  III,IV, VI: No ptosis. EOMI. No nystagmus.  V: Temp sensation equal bilaterally  VII: Smile symmetric VIII: Hearing intact to voice IX,X: No hoarseness XI: Symmetric shoulder shrug XII: Midline tongue extension Motor: BUE 5/5 proximally and distally BLE 5/5 proximally and distally  No pronator drift.  Sensory: Temp and light touch intact throughout, bilaterally. No extinction to DSS.  Deep Tendon Reflexes: 2+ and symmetric throughout Cerebellar: No ataxia with FNF bilaterally  Gait: Deferred  Labs/Imaging/Neurodiagnostic studies   CBC:  Recent Labs  Lab October 16, 2023 1126 10/05/23 1951 10/05/23 2008  WBC 5.6  --  8.0  NEUTROABS 3.1  --  5.8  HGB 14.0 14.6 14.2  HCT 40.0 43.0 41.5  MCV 94.1  --  95.6  PLT 183  --  222   Basic Metabolic Panel:  Lab Results  Component Value Date   NA 140 10/05/2023   K 3.6 10/05/2023   CO2 20 (L) 10/05/2023   GLUCOSE 124 (H) 10/05/2023   BUN 21 10/05/2023   CREATININE 1.15  10/05/2023   CALCIUM  9.4 10/05/2023   GFRNONAA >60 10/05/2023   GFRAA 77 09/08/2019   Lipid Panel:  Lab Results  Component Value Date   LDLCALC 82 04/22/2023   HgbA1c: No results found for: HGBA1C Urine Drug Screen:     Component Value Date/Time   LABOPIA NONE DETECTED 10/06/2023 0212   COCAINSCRNUR NONE DETECTED 10/06/2023 0212   LABBENZ NONE DETECTED 10/06/2023 0212   AMPHETMU NONE DETECTED 10/06/2023 0212   THCU NONE DETECTED 10/06/2023 0212   LABBARB NONE DETECTED 10/06/2023 0212    Alcohol Level     Component Value Date/Time   [facility] <15 10/05/2023 2008   INR  Lab Results  Component Value Date   INR 1.0 10/05/2023   APTT  Lab Results  Component Value Date   APTT 32 10/05/2023   AED levels:  Lab Results  Component Value Date   LEVETIRACETA 10.0 10/18/2021      ASSESSMENT  78 y.o. male with a PMHx as documented below (reviewed), who presents to the ED with uncontrolled HTN and an episode of confusion during which he could not operate his cellphone. His son also noted that his speech was nonsensical and slurred. Cognitive symptoms are now resolved. In addition to the speech deficit, he also had trouble using the fingers of his right hand to manipulate his phone, had double vision, had a right-sided headache and was with mild weakness on the right resulting in him listing to the right while ambulating. All of these symptoms are also now resolved.  - Neurological exam is nonfocal.  - CT head: No acute intracranial abnormality. Parenchymal volume loss commensurate with the patient's age. Periventricular white matter changes likely reflecting the sequela of small vessel ischemia. Remote lacunar infarct within the anterior left internal capsule and left corona radiata. - MRI brain: No acute intracranial abnormality. Generalized age-related cerebral atrophy with mild chronic small vessel ischemic disease. - EKG: Normal sinus rhythm; Left axis deviation; Prolonged QT -  Labs:  -  UDS negative. EtOH negative.  - U/A unremarkable. HgbA1c normal. Coags normal. WBC mildly elevated at 11.2.  - Low HDL and elevated LDL.  - CMP is essentially unremarkable from a stroke/TIA standpoint. Estimated GFR > 60.  - Impression: AMS in the setting of severe HTN. DDx includes TIA, viral illness, UTI, polypharmacy and metabolic encephalopathy. Most likely etiology is felt to be TIA.    RECOMMENDATIONS  - Continue his home Keppra .  - Switch amlodipine  back to losartan .  - Continue his simvastatin  - Permissive HTN x 24 hours.  - Start DAPT with ASA and Plavix . Continue both for 21 days, then discontinue Plavix  and continue ASA indefinitely.  - Vitamin B12 level and start supplementation as prior B12 in 2024 was low at 332.  - CTA of head and neck - PT consult, OT consult, Speech consult - TTE - Cardiac telemetry - Risk factor modification - Frequent neuro checks - NPO until passes stroke swallow screen - Stroke Team to follow in the morning.   I personally spent a total of 50 minutes in the care of the patient today including performing a medically appropriate exam/evaluation, documenting clinical information in the EHR, and independently interpreting results.   ______________________________________________________________________   Bonney SHARK, Jaikob Borgwardt, MD [facility]

## 2023-10-06 NOTE — ED Notes (Signed)
 This NT witnessed Pt able to walk to the restroom with minimum assistance from family. Pt able to walk back to treatment room with no assistance.

## 2023-10-06 NOTE — Progress Notes (Addendum)
 STROKE TEAM PROGRESS NOTE    SIGNIFICANT HOSPITAL EVENTS  9/13: uncontrolled HTN and an episode of confusion during which he could not operate his cellphone   INTERIM HISTORY/SUBJECTIVE  Son at bedside.   Patient able t describe episode yesterday. Enodrses acute onset of right hand weakness, leaning to the right side, diplopia to right eye (side-by-side) No neurological symptoms on exam. Denies headache, SOB. Endorses nausea.   Abdomen is distended and tender, will discuss CT abdomen with attending team.  Son states that patient has masses found in his throat previously but no biopsy was done.  Vascular surgery has seen him today. TCAR of left ICA scheduled for 9/22.  OBJECTIVE  CBC    Component Value Date/Time   WBC 11.2 (H) 10/06/2023 0511   RBC 4.42 10/06/2023 0511   HGB 14.3 10/06/2023 0511   HGB 13.3 07/01/2023 1107   HCT 42.2 10/06/2023 0511   HCT 41.4 07/01/2023 1107   PLT 215 10/06/2023 0511   PLT 229 07/01/2023 1107   MCV 95.5 10/06/2023 0511   MCV 97 07/01/2023 1107   MCH 32.4 10/06/2023 0511   MCHC 33.9 10/06/2023 0511   RDW 13.2 10/06/2023 0511   RDW 13.7 07/01/2023 1107   LYMPHSABS 1.2 10/05/2023 2008   LYMPHSABS 1.3 07/01/2023 1107   MONOABS 0.9 10/05/2023 2008   EOSABS 0.1 10/05/2023 2008   EOSABS 0.2 07/01/2023 1107   BASOSABS 0.1 10/05/2023 2008   BASOSABS 0.0 07/01/2023 1107    BMET    Component Value Date/Time   NA 137 10/06/2023 0714   NA 140 07/29/2023 1537   K 3.6 10/06/2023 0714   CL 102 10/06/2023 0714   CO2 25 10/06/2023 0714   GLUCOSE 136 (H) 10/06/2023 0714   BUN 22 10/06/2023 0714   BUN 14 07/29/2023 1537   CREATININE 1.05 10/06/2023 0714   CALCIUM  9.0 10/06/2023 0714   EGFR 59 (L) 07/29/2023 1537   GFRNONAA >60 10/06/2023 0714    IMAGING past 24 hours CT ANGIO HEAD NECK W WO CM Result Date: 10/06/2023 CLINICAL DATA:  78 year old male with altered mental status, headache, neurologic deficit. EXAM: CT ANGIOGRAPHY HEAD AND  NECK WITH AND WITHOUT CONTRAST TECHNIQUE: Multidetector CT imaging of the head and neck was performed using the standard protocol during bolus administration of intravenous contrast. Multiplanar CT image reconstructions and MIPs were obtained to evaluate the vascular anatomy. Carotid stenosis measurements (when applicable) are obtained utilizing NASCET criteria, using the distal internal carotid diameter as the denominator. RADIATION DOSE REDUCTION: This exam was performed according to the departmental dose-optimization program which includes automated exposure control, adjustment of the mA and/or kV according to patient size and/or use of iterative reconstruction technique. CONTRAST:  75mL OMNIPAQUE  IOHEXOL  350 MG/ML SOLN COMPARISON:  Head CT and brain MRI earlier today and last night. Previous ultrasound thyroid  imaging 06/20/2022 and earlier. FINDINGS: CTA NECK Skeleton: Absent maxillary dentition. Chronic lower cervical disc and endplate degeneration C4-C5, C5-C6. No acute osseous abnormality identified. Upper chest: Bilateral emphysema. Mild superimposed atelectasis. Negative visible superior mediastinum. Other neck: Nonvascular neck soft tissue spaces appear negative. Aortic arch: Advanced Calcified aortic atherosclerosis. Three vessel arch. Right carotid system: Brachiocephalic artery, right CCA origin soft and calcified plaque without stenosis. Dense right subclavian venous contrast streak artifact obscures a portion of the proximal right CCA. Soft and calcified right CCA plaque before the bifurcation without stenosis. Relatively mild calcified plaque at the right carotid bifurcation. However, bulky 7-8 mm segment of calcified plaque at the distal  bulb with RADIOGRAPHIC STRING SIGN STENOSIS (series 9, image 149 and series 7, image 107). The right ICA remains patent with no additional plaque to the skull base. Left carotid system: Left CCA origin plaque without stenosis. Left CCA tortuosity and additional  soft and calcified plaque without stenosis before the bifurcation. Moderate calcified left ICA origin. Heavily calcified distal left ICA bulb with high-grade stenosis approaching a radiographic string sign on series 9, image 214. Stenosis here is numerically estimated at 74 % with respect to the distal vessel. Left ICA remains patent with mild ectasia between the stenosis in the skull base. Vertebral arteries: Proximal right subclavian artery calcified plaque without stenosis. Right vertebral artery origin adjacent calcified plaque with mild stenosis (series 8, image 130). Patent right vertebral artery to the skull base with no other plaque or stenosis in the neck. Proximal left subclavian artery calcified plaque without stenosis. Calcified plaque adjacent to the left vertebral artery origin with mild stenosis on series 8, image 130. Tortuous left V1 segment. Mildly dominant left vertebral artery is patent to the skull base with no other plaque or stenosis. CTA HEAD Posterior circulation: Heavily calcified distal vertebral arteries, dominant distal left vertebral artery stenosis is moderate to severe on series 5, image 152 just proximal to the patent left PICA origin. Moderate non dominant right V4 segment calcified plaque and stenosis on image 156. Normal downstream right PICA origin. Additional moderate to severe stenosis of the right vertebral artery at the vertebrobasilar junction. Moderate contralateral left vertebrobasilar junction stenosis also (series 8, image 162). Patent basilar artery with mild tortuosity and irregularity. No basilar stenosis. Normal SCA and PCA origins. Small posterior communicating arteries. Left PCA is patent with mild irregularity. Right PCA P2 segment demonstrates mild to moderate long segment irregularity and stenosis on series 10, image 18. Distal right PCA branches are patent. Anterior circulation: Both ICA siphons are patent with bulky siphon calcified plaque. On the left only  mild siphon stenosis. On the right moderate stenosis of the proximal cavernous segment suspected on series 5, image 119. Patent carotid termini. Normal posterior communicating artery origins. Normal MCA and left ACA origins. Diminutive or absent right ACA A1 segment. Normal anterior communicating artery, tortuous proximal A2 segments. Bilateral ACA branches are within normal limits. Left MCA M1 segment and bifurcation are patent, mildly tortuous without stenosis. Right MCA M1 segment and trifurcation are patent and mildly tortuous without stenosis. Bilateral MCA branches are within normal limits. Venous sinuses: Early contrast timing, grossly patent. Anatomic variants: Dominant left vertebral artery. Left ACA A1 is dominant, the right is diminutive or absent. Review of the MIP images confirms the above findings IMPRESSION: 1. Advanced atherosclerosis from the aortic arch, throughout the neck, and at the skull base. No large vessel occlusion, but Positive for: - RICA bulb RADIOGRAPHIC STRING SIGN STENOSIS. - LICA bulb high-grade stenosis, estimated 74% and approaching a String sign. - bilateral distal Vertebral Artery and Vertebrobasilar junction Moderate to Severe stenoses (maximal left proximal V4 segment). - up to Moderate stenosis cavernous Right ICA siphon. 2.  Aortic Atherosclerosis (ICD10-I70.0). 3.  Emphysema (ICD10-J43.9). Electronically Signed   By: VEAR Hurst M.D.   On: 10/06/2023 07:22   DG Abd 1 View Result Date: 10/06/2023 EXAM: 1 VIEW XRAY OF THE ABDOMEN 10/06/2023 06:45:00 AM COMPARISON: CT 07/12/2023 CLINICAL HISTORY: Abdominal distension; Vomiting. Reason for exam: pt order states abdominal distention; vomiting ; Per ED triage notes: The pt has been confused intermittently for awhile yesterday he drove himself to [facility]  and was seen there. Today the pt called the son and told him that he had taken a nap and when he woke up he felt bad and tried to call; his son and could not dial the phone. His  son reported that the pt has not been his self for awhile pa saw here in triage FINDINGS: BOWEL: Gaseous distention of the stomach, mild. In the left lower quadrant of the abdomen there are a few air-filled loops of small bowel measuring up to 2.6 cm. Gas and stool noted within the colon up to the rectum. SOFT TISSUES: No opaque urinary calculi. Vascular calcifications. BONES: No acute osseous abnormality. Multilevel thoracolumbar degenerative changes. IMPRESSION: 1. Nonspecific bowel gas pattern with a few mildly distended air-filled loops of small bowel in the left lower quadrant of the abdomen. No signs of high-grade bowel obstruction. Electronically signed by: Waddell Calk MD 10/06/2023 06:56 AM EDT RP Workstation: [id number]   MR BRAIN WO CONTRAST Result Date: 10/06/2023 CLINICAL DATA:  Initial evaluation for acute neuro deficit, stroke suspected. EXAM: MRI HEAD WITHOUT CONTRAST TECHNIQUE: Multiplanar, multiecho pulse sequences of the brain and surrounding structures were obtained without intravenous contrast. COMPARISON:  CT from 10/05/2023 FINDINGS: Brain: Examination moderately degraded by motion artifact. Diffuse prominence of the CSF containing spaces compatible generalized cerebral atrophy. Patchy and confluent T2/FLAIR hyperintensity involving the periventricular deep white matter both cerebral hemispheres as well as the pons, consistent with chronic small vessel ischemic disease, mild for age. No evidence for acute or subacute infarct. Gray-white matter differentiation maintained. No is a chronic cortical infarction. No visible acute or chronic intracranial blood products. No mass lesion, midline shift or mass effect. No hydrocephalus or extra-axial fluid collection. Pituitary gland within normal limits for age Vascular: Major intracranial vascular flow voids are maintained. Skull and upper cervical spine: Craniocervical junction within normal limits. Bone marrow signal intensity normal. No scalp  soft tissue abnormality. Sinuses/Orbits: Globes and orbital soft tissues within normal limits. Paranasal sinuses are largely clear. No significant mastoid effusion. Other: None. IMPRESSION: 1. No acute intracranial abnormality. 2. Generalized age-related cerebral atrophy with mild chronic small vessel ischemic disease. Electronically Signed   By: Morene Hoard M.D.   On: 10/06/2023 02:10   DG Chest Port 1 View Result Date: 10/06/2023 CLINICAL DATA:  Cough EXAM: PORTABLE CHEST 1 VIEW COMPARISON:  Film from 2 days previous FINDINGS: Cardiac shadow is stable. Aortic calcifications are noted. Elevation of the right hemidiaphragm is seen. No focal infiltrate or effusion is noted. No bony abnormality is seen. IMPRESSION: No acute abnormality noted. Electronically Signed   By: Oneil Devonshire M.D.   On: 10/06/2023 01:06   CT HEAD WO CONTRAST Result Date: 10/05/2023 EXAM: CT HEAD WITHOUT CONTRAST 10/05/2023 08:20:00 PM TECHNIQUE: CT of the head was performed without the administration of intravenous contrast. Automated exposure control, iterative reconstruction, and/or weight based adjustment of the mA/kV was utilized to reduce the radiation dose to as low as reasonably achievable. COMPARISON: None available. CLINICAL HISTORY: Neuro deficit, acute, stroke suspected. AMS, HA FINDINGS: BRAIN AND VENTRICLES: No acute hemorrhage. No evidence of acute infarct. No hydrocephalus. No extra-axial collection. No mass effect or midline shift. Parenchymal volume loss is commensurate with the patient's age. Periventricular white matter changes are present likely reflecting the sequela of small vessel ischemia. Remote lacunar infarct within the anterior left internal capsule and left corona radiata. ORBITS: No acute abnormality. SINUSES: No acute abnormality. SOFT TISSUES AND SKULL: No acute soft tissue abnormality. No skull fracture. IMPRESSION:  1. No acute intracranial abnormality. 2. Parenchymal volume loss commensurate with  the patient's age. 3. Periventricular white matter changes likely reflecting the sequela of small vessel ischemia. 4. Remote lacunar infarct within the anterior left internal capsule and left corona radiata. Electronically signed by: Dorethia Molt MD 10/05/2023 08:34 PM EDT RP Workstation: [id number]    Vitals:   10/06/23 0530 10/06/23 0600 10/06/23 0645 10/06/23 0755  BP: (!) 159/88 (!) 156/77 (!) 143/111 (!) 162/81  Pulse: 83 80 (!) 105 85  Resp: (!) 29 (!) 24 (!) 22 (!) 22  Temp:      TempSrc:      SpO2: 96% 96% 100% 95%  Weight:      Height:         PHYSICAL EXAM General:  Alert, well-nourished, well-developed patient in no acute distress CV: Regular rate and rhythm on monitor, some intermittent ST Respiratory:  Regular, unlabored respirations on room air GI: Distended, soft, tender.   NEURO:  Mental Status: AA&Ox3, patient is able to give clear and coherent history Speech/Language: speech is without dysarthria or aphasia.  Naming, repetition, fluency, and comprehension intact.  Cranial Nerves:  II: PERRL. Visual fields full.  III, IV, VI: EOMI. Eyelids elevate symmetrically.  V: Sensation is intact to light touch and symmetrical to face.  VII: Face is symmetrical resting and smiling VIII: hearing intact to voice. IX, X: Palate elevates symmetrically. Phonation is normal.  KP:Dynloizm shrug 5/5. XII: tongue is midline without fasciculations. Motor: 5/5 strength to all muscle groups tested.  Tone: is normal and bulk is normal Sensation- Intact to light touch bilaterally. Extinction absent to light touch to DSS.   Coordination: FTN intact bilaterally, HKS: no ataxia in BLE.No drift.  Gait- deferred  Most Recent NIH:0    ASSESSMENT/PLAN  [name] is a 78 y.o. male with history of who presented 9/14 early a.m. due to uncontrolled HTN and an episode of confusion during which he could not operate his cellphone. His son also noted that his speech was nonsensical  and slurred.  Symptoms resolved on ED assessment.  Imaging negative, admitted for TIA workup.  NIH on Admission: 0.  Left brain TIA, etiology: Large vessel disease from left ICA stenosis Pt and son stated that pt had episode of aphasia, R hand clumsy, right leaning and confusion, now resolved CT head No acute abnormality.  CTA head & neck No LVO Right ICA with string sign stenosis. Left ICA with high-grade stenosis estimated at 74% MRI No acute abnormality 2D Echo pending LDL 107 HgbA1c 5.0 UDS neg VTE prophylaxis - SCDs, ok for lovenox  No antithrombotic prior to admission, now on aspirin  81 mg daily and clopidogrel  75 mg daily DAPT. Final regimen per [facility] Therapy recommendations:  No follow up needed  Disposition: pending  Carotid stenosis CTA head & neck No LVO Right ICA with string sign stenosis. Left ICA with high-grade stenosis estimated at 74% Symptoms concerning for left symptomatic ICA stenosis [facility] consulted and planning for ICA intervention on 9/29 May have R ICA intervention in staged fashion Follow up with [facility] as outpt  Hx of Seizures Per son seizure in 2021 noted to be provoked by medication withdrawal Home meds: Keppra  500mg  HS, continued EEG pending  N/V Abdominal distention Mild abdominal tenderness on palpation CT abdomen/pelvis pending Zofran  PRN  Hypertension Hx of CHFpEF Home meds:  losartan -->amlodipine  on 9/12 Stable now Long term BP goal normotensive Avoid hypotension due to bilateral ICA severe stenosis  Hyperlipidemia Home meds:  simvastatin  20 LDL 107, goal < 70 Changed to Crestor  20mg  Continue statin at discharge  Other Stroke Risk Factors Obesity, Body mass index is 30.28 kg/m., BMI >/= 30 associated with increased stroke risk, recommend weight loss, diet and exercise as appropriate  Hx of stroke - silent stroke - MR brain 08/24/2019 small remote left basal ganglia infarct, moderate cerebral atrophy and mild chronic microvascular ischemic  changes   Other Active Problems Hx of Anxiety and Claustrophobia Hx of chronic back pain  Hospital day # 0  I spent additional 30 min inpatient face-to-face time with the patient and family, reviewing test results, images and medication, and discussing the diagnosis, treatment plan and potential prognosis. This patient's care requiresreview of multiple databases, neurological assessment, discussion with family, other specialists and medical decision making of high complexity. I had long discussion with pt and son at bedside, updated pt current condition, treatment plan and potential prognosis, and answered all the questions. They expressed understanding and appreciation.    Ary Cummins, MD PhD Stroke Neurology 10/06/2023 2:39 PM   To contact Stroke Continuity provider, please refer to [email or web address]. After hours, contact General Neurology

## 2023-10-06 NOTE — Consult Note (Signed)
 Hospital Consult    Reason for Consult: Symptomatic ICA stenosis Requesting Physician: Hospital medicine MRN #:  [id number]  History of Present Illness: This is a 78 y.o. male who presented overnight with TIA-like symptoms-speech difficulties, trouble with his right hand.  Vascular surgery was called after imaging demonstrated moderate stenosis in the right ICA, severe stenosis in the left ICA.  On exam, Eric Cook was doing well.  He was undergoing EEG monitoring.  He noted that yesterday he was unable to text using the right hand due to losing fine motor movements.  He had speech difficulties appreciated by his son.  Fortunately, all of these have resolved.   Past Medical History:  Diagnosis Date   Anxiety    Chronic back pain    lumbar   HLD (hyperlipidemia)    Hyperlipidemia    Hypertension    Peptic ulcer    Seizures (HCC)    medication related    Past Surgical History:  Procedure Laterality Date   LUMBAR DISC SURGERY     L3-L4   UPPER GASTROINTESTINAL ENDOSCOPY      No Known Allergies  Prior to Admission medications   Medication Sig Start Date End Date Taking? Authorizing Provider  acetaminophen  (TYLENOL ) 500 MG tablet Take 500 mg by mouth as needed for moderate pain or mild pain (As Needed).   Yes [provider]  amLODipine  (NORVASC ) 5 MG tablet Take 1 tablet (5 mg total) by mouth daily. 10/04/23  Yes Curatolo, Adam, DO  cimetidine  (TAGAMET ) 400 MG tablet TAKE 1 TABLET TWICE DAILY AS NEEDED 07/15/23  Yes Gottschalk, Ashly M, DO  furosemide  (LASIX ) 20 MG tablet TAKE 1 TABLET (20 MG TOTAL) BY MOUTH DAILY AS NEEDED FOR FLUID. 01/09/23  Yes Gottschalk, Ashly M, DO  levETIRAcetam  (KEPPRA  XR) 500 MG 24 hr tablet Take 1 tablet (500 mg total) by mouth at bedtime. 02/20/23  Yes McCue, Harlene, NP  meloxicam (MOBIC) 15 MG tablet Take 15 mg by mouth daily. 09/20/23  Yes [provider]  sertraline  (ZOLOFT ) 50 MG tablet TAKE 1 TABLET BY MOUTH EVERY DAY 06/04/23  Yes  Hawks, Bari A, FNP  simvastatin  (ZOCOR ) 20 MG tablet TAKE 1 TABLET EVERY DAY AT 6 PM 08/15/23  Yes Gottschalk, Norene M, DO  tamsulosin  (FLOMAX ) 0.4 MG CAPS capsule TAKE 1 CAPSULE EVERY DAY 07/29/23  Yes Gottschalk, Ashly M, DO  traZODone  (DESYREL ) 50 MG tablet TAKE 0.5-1 TABLETS BY MOUTH AT BEDTIME AS NEEDED FOR SLEEP. 11/21/22  Yes Gottschalk, Ashly M, DO  dicyclomine  (BENTYL ) 10 MG capsule Take 2 capsules (20 mg total) by mouth 3 (three) times daily before meals. TAKE 2 CAPSULES (20 MG) 15 MINUTES BEFORE MEALS AS NEEDED; Patient not taking: Reported on 10/06/2023 08/09/23   Abran Norleen SAILOR, MD  fluticasone  (FLONASE ) 50 MCG/ACT nasal spray USE 2 SPRAYS IN EACH NOSTRIL EVERY DAY Patient not taking: Reported on 10/06/2023 08/13/22   Jolinda Norene HERO, DO  losartan  (COZAAR ) 100 MG tablet Take 1 tablet (100 mg total) by mouth daily. Patient not taking: Reported on 10/06/2023 07/29/23   Jolinda Norene HERO, DO    Social History   Socioeconomic History   Marital status: Single    Spouse name: Not on file   Number of children: 1   Years of education: Not on file   Highest education level: Some college, no degree  Occupational History   Occupation: Lobbyist    Comment: part time  Tobacco Use   Smoking status: Former  Current packs/day: 0.00    Average packs/day: 1 pack/day for 54.0 years (54.0 ttl pk-yrs)    Types: Cigarettes    Start date: 04/1965    Quit date: 04/2019    Years since quitting: 4.4   Smokeless tobacco: Never  Vaping Use   Vaping status: Never Used  Substance and Sexual Activity   Alcohol use: Never   Drug use: Never   Sexual activity: Never  Other Topics Concern   Not on file  Social History Narrative   Lives alone   Right Handed   Drinks 2-3 cups of caffeine daily   Son in [place]   Social Drivers of Health   Financial Resource Strain: Patient Declined (07/29/2023)   Overall Financial Resource Strain (CARDIA)    Difficulty of Paying Living  Expenses: Patient declined  Food Insecurity: No Food Insecurity (07/29/2023)   Hunger Vital Sign    Worried About Running Out of Food in the Last Year: Never true    Ran Out of Food in the Last Year: Never true  Transportation Needs: No Transportation Needs (07/29/2023)   PRAPARE - Administrator, Civil Service (Medical): No    Lack of Transportation (Non-Medical): No  Physical Activity: Sufficiently Active (07/29/2023)   Exercise Vital Sign    Days of Exercise per Week: 4 days    Minutes of Exercise per Session: 60 min  Stress: No Stress Concern Present (07/29/2023)   [employer] of Occupational Health - Occupational Stress Questionnaire    Feeling of Stress: Only a little  Social Connections: Socially Isolated (07/29/2023)   Social Connection and Isolation Panel    Frequency of Communication with Friends and Family: More than three times a week    Frequency of Social Gatherings with Friends and Family: Twice a week    Attends Religious Services: Patient declined    Database administrator or Organizations: No    Attends Engineer, structural: Not on file    Marital Status: Divorced  Intimate Partner Violence: Not At Risk (06/24/2023)   Humiliation, Afraid, Rape, and Kick questionnaire    Fear of Current or Ex-Partner: No    Emotionally Abused: No    Physically Abused: No    Sexually Abused: No   Family History  Problem Relation Age of Onset   Colon polyps Brother 50   Colon cancer Brother    Colon polyps Son 25   Other Son        vertigo   Kidney Stones Son    Esophageal cancer Neg Hx    Pancreatic cancer Neg Hx    Stomach cancer Neg Hx    Rectal cancer Neg Hx     ROS: Otherwise negative unless mentioned in HPI  Physical Examination  Vitals:   10/06/23 0755 10/06/23 0901  BP: (!) 162/81   Pulse: 85   Resp: (!) 22   Temp:  97.8 F (36.6 C)  SpO2: 95%    Body mass index is 30.28 kg/m.  General:  WDWN in NAD Gait: Not observed HENT: WNL,  normocephalic Pulmonary: normal non-labored breathing, without Rales, rhonchi,  wheezing Cardiac: regular Abdomen:  soft, NT/ND, no masses Skin: without rashes Vascular Exam/Pulses: 2+ pedal pulses Extremities: without ischemic changes, without Gangrene , without cellulitis; without open wounds;  Musculoskeletal: no muscle wasting or atrophy  Neurologic: A&O X 3;  No focal weakness or paresthesias are detected; speech is fluent/normal Psychiatric:  The pt has Normal affect. Lymph:  Unremarkable  CBC  Component Value Date/Time   WBC 11.2 (H) 10/06/2023 0511   RBC 4.42 10/06/2023 0511   HGB 14.3 10/06/2023 0511   HGB 13.3 07/01/2023 1107   HCT 42.2 10/06/2023 0511   HCT 41.4 07/01/2023 1107   PLT 215 10/06/2023 0511   PLT 229 07/01/2023 1107   MCV 95.5 10/06/2023 0511   MCV 97 07/01/2023 1107   MCH 32.4 10/06/2023 0511   MCHC 33.9 10/06/2023 0511   RDW 13.2 10/06/2023 0511   RDW 13.7 07/01/2023 1107   LYMPHSABS 1.2 10/05/2023 2008   LYMPHSABS 1.3 07/01/2023 1107   MONOABS 0.9 10/05/2023 2008   EOSABS 0.1 10/05/2023 2008   EOSABS 0.2 07/01/2023 1107   BASOSABS 0.1 10/05/2023 2008   BASOSABS 0.0 07/01/2023 1107    BMET    Component Value Date/Time   NA 137 10/06/2023 0714   NA 140 07/29/2023 1537   K 3.6 10/06/2023 0714   CL 102 10/06/2023 0714   CO2 25 10/06/2023 0714   GLUCOSE 136 (H) 10/06/2023 0714   BUN 22 10/06/2023 0714   BUN 14 07/29/2023 1537   CREATININE 1.05 10/06/2023 0714   CALCIUM  9.0 10/06/2023 0714   GFRNONAA >60 10/06/2023 0714   GFRAA 77 09/08/2019 1620    COAGS: Lab Results  Component Value Date   INR 1.0 10/05/2023    ASSESSMENT/PLAN: This is a 78 y.o. male who presented with left hemispheric strokelike symptoms diagnosed with TIA.  MRI negative.  CTA demonstrates greater than 50% stenosis in the left ICA, greater than 80% stenosis in the right ICA.  The left ICA is symptomatic, therefore this needs to be the priority.  I had a long  conversation with Eric Cook, and found his son in the cafeteria and had another conversation with him regarding the need for revascularization.  We discussed that with symptomatic ICA stenosis and the risk of repeat stroke is 26% over the next 2 years.  With intervention, this decreases to 9%.  We discussed both carotid endarterectomy versus transcarotid artery revascularization.  I think is a candidate for both.  After discussing the risks and benefits of each, both Eric Cook and his son chose transcarotid artery revascularization.  Eric Cook's daughter-in-law has surgery scheduled for next week.  They elected to hold on a surgery until after she has her oophorectomy.  This will be scheduled by my office with plans for surgery on 9/29.  Please continue dual antiplatelet therapy, high intensity statin through the surgery date.   Fonda Eric Rim MD [facility] [phone] 10/06/2023  9:13 AM

## 2023-10-06 NOTE — ED Notes (Signed)
 Patient ambulated to restroom, then around nursing station as staying in room was making him claustrophobic. Walking with son with minimal assist.

## 2023-10-06 NOTE — Evaluation (Signed)
 Physical Therapy Brief Evaluation and Discharge Note Patient Details Name: Eric Cook MRN: [id number] DOB: May 01, 1945 Today's Date: 10/06/2023   History of Present Illness  Pt is a 78 y.o. male presenting with slurred speech, R sided headache, gait disturbance, blurred vision. Admitted for AMS in the setting of severe HTN. MRI brain: No acute intracranial abnormality; workup for TIA. Recent ED visit 9/12 for severe HTN. PMH: complex partial seizure disorder, HFpEF, HTN, HLD, COPD, BPH, UE lymphedema  Clinical Impression  Pt doing well with mobility and no further PT needed.  Ready for dc from PT standpoint.        PT Assessment Patient does not need any further PT services  Assistance Needed at Discharge  PRN    Equipment Recommendations None recommended by PT  Recommendations for Other Services       Precautions/Restrictions Precautions Precautions: Fall Recall of Precautions/Restrictions: Intact Restrictions Weight Bearing Restrictions Per Provider Order: No        Mobility  Bed Mobility     Sit to supine/sidelying: Supervision    Transfers Overall transfer level: Modified independent Equipment used: None                    Ambulation/Gait Ambulation/Gait assistance: Supervision Gait Distance (Feet): 150 Feet Assistive device: None Gait Pattern/deviations: Step-through pattern, Decreased stride length Gait Speed: Below normal General Gait Details: Steady gait with supervision for lines only  Home Activity Instructions    Stairs            Modified Rankin (Stroke Patients Only) Modified Rankin (Stroke Patients Only) Pre-Morbid Rankin Score: No symptoms Modified Rankin: No symptoms      Balance Overall balance assessment: Mild deficits observed, not formally tested                        Pertinent Vitals/Pain PT - Brief Vital Signs All Vital Signs Stable: Yes Pain Assessment Pain Assessment: No/denies pain     Home  Living Family/patient expects to be discharged to:: Private residence Living Arrangements: Alone Available Help at Discharge: Family;Available PRN/intermittently Home Environment: Stairs to enter;Rail - left  Stairs-Number of Steps: 3-4 Home Equipment: None   Additional Comments: Son can monitor via cameras in the home    Prior Function Level of Independence: Independent      UE/LE Assessment   UE ROM/Strength/Tone/Coordination: WFL    LE ROM/Strength/Tone/Coordination: [facility]      Communication   Communication Communication: No apparent difficulties     Cognition Overall Cognitive Status: Appears within functional limits for tasks assessed/performed       General Comments General comments (skin integrity, edema, etc.): Able to turn, side step, and walk backwards without difficulty    Exercises     Assessment/Plan    PT Problem List         PT Visit Diagnosis Other (comment)    No Skilled PT Patient at baseline level of functioning   Co-evaluation                AMPAC 6 Clicks Help needed turning from your back to your side while in a flat bed without using bedrails?: None Help needed moving from lying on your back to sitting on the side of a flat bed without using bedrails?: None Help needed moving to and from a bed to a chair (including a wheelchair)?: None Help needed standing up from a chair using your arms (e.g., wheelchair or bedside chair)?: None  Help needed to walk in hospital room?: A Little Help needed climbing 3-5 steps with a railing? : A Little 6 Click Score: 22      End of Session   Activity Tolerance: Patient tolerated treatment well Patient left: in bed;with call bell/phone within reach;with family/visitor present Nurse Communication: Mobility status PT Visit Diagnosis: Other (comment)     Time: 8965-8944 PT Time Calculation (min) (ACUTE ONLY): 21 min  Charges:   PT Evaluation $PT Eval Low Complexity: 1 Low      [facility] PT Acute Rehabilitation Services Office [phone]   Rodgers ORN [facility]  10/06/2023, 11:42 AM

## 2023-10-06 NOTE — H&P (Addendum)
 History and Physical    Eric Cook FMW:[id number] DOB: Nov 27, 1945 DOA: 10/05/2023  PCP: Jolinda Norene HERO, DO   Patient coming from: Home   Chief Complaint:  Chief Complaint  Patient presents with   Altered Mental Status    HPI:  Eric Cook is a 78 y.o. male with hx of complex partial seizure disorder (symptoms of speech disturbance, automatisms, confusion, disorientation), HFpEF, HTN, HLD, COPD, BPH, UE lymphedema who was recently seen in the ED for severe range hypertension on 9/12 improved with IV hydralazine  and was started on amlodipine  and discharged. He left the ED (discharge time was around ~230PM) and took a nap, he woke up around 3PM and was trying to call his son but could not figure out how to work his phone. This lasted for about an hour and called son around 14PM. Son noted that while speaking to him over the phone speech was very slurred, and nonsensical. Could only make out things like Christmas, Receipt. Around time that son got there ~ 430 PM his speech was clearing up and near back to his normal. Upon waking patient also noted that he was having R sided headache, and trouble walking, falling towards the R side. Around time son arrived noted having blurred vision more on the R side, and horizontal diplopia. Diplopia has resolved, although still feels R sided vision is not quite at baseline. He has had recent issues with UE swelling, and has some weakness in the L hand attributes to shutting in a trashcan lid months ago. Denies any new numbness or weakness in his extremities.    And otherwise he has developed epigastric burning and frequent N/V x 5 episodes since being in the ED. He has abd distension, reports this has actually improved. Denies obstipation, reports passing gas / stool normally prior to his arrival.    Review of Systems:  ROS complete and negative except as marked above   No Known Allergies  Prior to Admission medications   Medication Sig  Start Date End Date Taking? Authorizing Provider  acetaminophen  (TYLENOL ) 500 MG tablet Take 500 mg by mouth as needed for moderate pain or mild pain (As Needed).   Yes [provider]  amLODipine  (NORVASC ) 5 MG tablet Take 1 tablet (5 mg total) by mouth daily. 10/04/23  Yes Curatolo, Adam, DO  levETIRAcetam  (KEPPRA  XR) 500 MG 24 hr tablet Take 1 tablet (500 mg total) by mouth at bedtime. 02/20/23  Yes McCue, Harlene, NP  cimetidine  (TAGAMET ) 400 MG tablet TAKE 1 TABLET TWICE DAILY AS NEEDED 07/15/23   Jolinda Norene M, DO  diclofenac (VOLTAREN) 75 MG EC tablet Take 75 mg by mouth 2 (two) times daily. 05/29/23   [provider]  dicyclomine  (BENTYL ) 10 MG capsule Take 2 capsules (20 mg total) by mouth 3 (three) times daily before meals. TAKE 2 CAPSULES (20 MG) 15 MINUTES BEFORE MEALS AS NEEDED; 08/09/23   Abran Norleen SAILOR, MD  fluticasone  (FLONASE ) 50 MCG/ACT nasal spray USE 2 SPRAYS IN EACH NOSTRIL EVERY DAY Patient taking differently: Place 2 sprays into both nostrils daily as needed for allergies. 08/13/22   Jolinda Norene HERO, DO  fluticasone  furoate-vilanterol (BREO ELLIPTA ) 200-25 MCG/ACT AEPB Inhale 1 puff into the lungs daily. 12/03/22   Hunsucker, Donnice JONELLE, MD  furosemide  (LASIX ) 20 MG tablet TAKE 1 TABLET (20 MG TOTAL) BY MOUTH DAILY AS NEEDED FOR FLUID. 01/09/23   Jolinda Norene M, DO  gabapentin (NEURONTIN) 300 MG capsule Take 300 mg by  mouth 2 (two) times daily. 03/27/23   [provider]  losartan  (COZAAR ) 100 MG tablet Take 1 tablet (100 mg total) by mouth daily. 07/29/23   Jolinda Norene HERO, DO  ondansetron  (ZOFRAN -ODT) 4 MG disintegrating tablet Take 1 tablet (4 mg total) by mouth every 8 (eight) hours as needed for nausea or vomiting. 07/01/23   Jolinda Norene HERO, DO  pantoprazole  (PROTONIX ) 40 MG tablet Take 1 tablet (40 mg total) by mouth daily. 02/13/23   Abran Norleen SAILOR, MD  sertraline  (ZOLOFT ) 50 MG tablet TAKE 1 TABLET BY MOUTH EVERY DAY 06/04/23   Lavell Lye A, FNP  simvastatin  (ZOCOR ) 20 MG tablet TAKE 1 TABLET EVERY DAY AT 6 PM 08/15/23   Jolinda Norene M, DO  tamsulosin  (FLOMAX ) 0.4 MG CAPS capsule TAKE 1 CAPSULE EVERY DAY 07/29/23   Jolinda Norene M, DO  traZODone  (DESYREL ) 50 MG tablet TAKE 0.5-1 TABLETS BY MOUTH AT BEDTIME AS NEEDED FOR SLEEP. 11/21/22   Jolinda Norene HERO, DO    Past Medical History:  Diagnosis Date   Anxiety    Chronic back pain    lumbar   HLD (hyperlipidemia)    Hyperlipidemia    Hypertension    Peptic ulcer    Seizures (HCC)    medication related    Past Surgical History:  Procedure Laterality Date   LUMBAR DISC SURGERY     L3-L4   UPPER GASTROINTESTINAL ENDOSCOPY       reports that he quit smoking about 4 years ago. His smoking use included cigarettes. He started smoking about 58 years ago. He has a 54 pack-year smoking history. He has never used smokeless tobacco. He reports that he does not drink alcohol and does not use drugs.  Family History  Problem Relation Age of Onset   Colon polyps Brother 80   Colon cancer Brother    Colon polyps Son 74   Other Son        vertigo   Kidney Stones Son    Esophageal cancer Neg Hx    Pancreatic cancer Neg Hx    Stomach cancer Neg Hx    Rectal cancer Neg Hx      Physical Exam: Vitals:   10/05/23 1942 10/05/23 2007 10/06/23 0015 10/06/23 0235  BP: (!) 158/80  (!) 176/82 (!) 144/90  Pulse: 89  85 85  Resp: 20  20 (!) 24  Temp: 98.3 F (36.8 C)  98.1 F (36.7 C)   TempSrc: Oral  Oral   SpO2: 94%  94% 91%  Weight:  93 kg    Height:  5' 9 (1.753 m)      Gen: Awake, alert, NAD   CV: Regular, normal S1, S2, no murmurs  Resp: Normal WOB, CTAB  Abd: round and slightly distended, mild epigastric tenderness, Dry heaving after abd palpation. No rebound, guarding, rigidity.  MSK: Symmetric, no edema  Skin: No rashes or lesions to exposed skin  Neuro: Alert and interactive, fully oriented, speech is clear, CN 2-12 intact, motor is 4/5 in the  LUE, otherwise 5/5 throughout. Sensation is intact and equal to fine touch.  Psych: euthymic, appropriate    Data review:   Labs reviewed, notable for:   Bicarb 20, AG 16  HS trop neg  Blood counts unremarkable    Micro:  Results for orders placed or performed during the hospital encounter of 10/05/23  Resp panel by RT-PCR (RSV, Flu A&B, Covid) Anterior Nasal Swab     Status: None  Collection Time: 10/06/23 12:47 AM   Specimen: Anterior Nasal Swab  Result Value Ref Range Status   SARS Coronavirus 2 by RT PCR NEGATIVE NEGATIVE Final   Influenza A by PCR NEGATIVE NEGATIVE Final   Influenza B by PCR NEGATIVE NEGATIVE Final    Comment: (NOTE) The Xpert Xpress SARS-CoV-2/FLU/RSV plus assay is intended as an aid in the diagnosis of influenza from Nasopharyngeal swab specimens and should not be used as a sole basis for treatment. Nasal washings and aspirates are unacceptable for Xpert Xpress SARS-CoV-2/FLU/RSV testing.  Fact Sheet for Patients: BloggerCourse.com  Fact Sheet for Healthcare Providers: SeriousBroker.it  This test is not yet approved or cleared by the [place]  FDA and has been authorized for detection and/or diagnosis of SARS-CoV-2 by FDA under an Emergency Use Authorization (EUA). This EUA will remain in effect (meaning this test can be used) for the duration of the COVID-19 declaration under Section 564(b)(1) of the Act, 21 U.S.C. section 360bbb-3(b)(1), unless the authorization is terminated or revoked.     Resp Syncytial Virus by PCR NEGATIVE NEGATIVE Final    Comment: (NOTE) Fact Sheet for Patients: BloggerCourse.com  Fact Sheet for Healthcare Providers: SeriousBroker.it  This test is not yet approved or cleared by the [place]  FDA and has been authorized for detection and/or diagnosis of SARS-CoV-2 by FDA under an Emergency Use Authorization  (EUA). This EUA will remain in effect (meaning this test can be used) for the duration of the COVID-19 declaration under Section 564(b)(1) of the Act, 21 U.S.C. section 360bbb-3(b)(1), unless the authorization is terminated or revoked.  Performed at [facility], [address], [place], [place] [address]     Imaging reviewed:  MR BRAIN WO CONTRAST Result Date: 10/06/2023 CLINICAL DATA:  Initial evaluation for acute neuro deficit, stroke suspected. EXAM: MRI HEAD WITHOUT CONTRAST TECHNIQUE: Multiplanar, multiecho pulse sequences of the brain and surrounding structures were obtained without intravenous contrast. COMPARISON:  CT from 10/05/2023 FINDINGS: Brain: Examination moderately degraded by motion artifact. Diffuse prominence of the CSF containing spaces compatible generalized cerebral atrophy. Patchy and confluent T2/FLAIR hyperintensity involving the periventricular deep white matter both cerebral hemispheres as well as the pons, consistent with chronic small vessel ischemic disease, mild for age. No evidence for acute or subacute infarct. Gray-white matter differentiation maintained. No is a chronic cortical infarction. No visible acute or chronic intracranial blood products. No mass lesion, midline shift or mass effect. No hydrocephalus or extra-axial fluid collection. Pituitary gland within normal limits for age Vascular: Major intracranial vascular flow voids are maintained. Skull and upper cervical spine: Craniocervical junction within normal limits. Bone marrow signal intensity normal. No scalp soft tissue abnormality. Sinuses/Orbits: Globes and orbital soft tissues within normal limits. Paranasal sinuses are largely clear. No significant mastoid effusion. Other: None. IMPRESSION: 1. No acute intracranial abnormality. 2. Generalized age-related cerebral atrophy with mild chronic small vessel ischemic disease. Electronically Signed   By: Morene Hoard M.D.   On: 10/06/2023 02:10    DG Chest Port 1 View Result Date: 10/06/2023 CLINICAL DATA:  Cough EXAM: PORTABLE CHEST 1 VIEW COMPARISON:  Film from 2 days previous FINDINGS: Cardiac shadow is stable. Aortic calcifications are noted. Elevation of the right hemidiaphragm is seen. No focal infiltrate or effusion is noted. No bony abnormality is seen. IMPRESSION: No acute abnormality noted. Electronically Signed   By: Oneil Devonshire M.D.   On: 10/06/2023 01:06   CT HEAD WO CONTRAST Result Date: 10/05/2023 EXAM: CT HEAD WITHOUT CONTRAST 10/05/2023  08:20:00 PM TECHNIQUE: CT of the head was performed without the administration of intravenous contrast. Automated exposure control, iterative reconstruction, and/or weight based adjustment of the mA/kV was utilized to reduce the radiation dose to as low as reasonably achievable. COMPARISON: None available. CLINICAL HISTORY: Neuro deficit, acute, stroke suspected. AMS, HA FINDINGS: BRAIN AND VENTRICLES: No acute hemorrhage. No evidence of acute infarct. No hydrocephalus. No extra-axial collection. No mass effect or midline shift. Parenchymal volume loss is commensurate with the patient's age. Periventricular white matter changes are present likely reflecting the sequela of small vessel ischemia. Remote lacunar infarct within the anterior left internal capsule and left corona radiata. ORBITS: No acute abnormality. SINUSES: No acute abnormality. SOFT TISSUES AND SKULL: No acute soft tissue abnormality. No skull fracture. IMPRESSION: 1. No acute intracranial abnormality. 2. Parenchymal volume loss commensurate with the patient's age. 3. Periventricular white matter changes likely reflecting the sequela of small vessel ischemia. 4. Remote lacunar infarct within the anterior left internal capsule and left corona radiata. Electronically signed by: Dorethia Molt MD 10/05/2023 08:34 PM EDT RP Workstation: [id number]   DG Chest 2 View Result Date: 10/04/2023 CLINICAL DATA:  Pain EXAM: DG CHEST 2V COMPARISON:   August 09, 2022 FINDINGS: The heart size and mediastinal contours are within normal limits. Aorta is prominent and calcified. Increased lung volumes suggestive of COPD. Increased interstitial marking of both lung fields. No new pulmonary opacity or nodule. The visualized skeletal structures are unremarkable.No pleural effusion or pneumothorax. IMPRESSION: No active cardiopulmonary disease. Electronically Signed   By: Megan  Zare M.D.   On: 10/04/2023 11:44   Historical records:  Excerpt from most recent neurology note re: initial consult   Initial consult visit 11/10/2019 Dr. [name]: Mr. Lubrano is a 78 year old Caucasian male with past medical history of hypertension, hyperlipidemia and anxiety who is seen today for initial office consultation visit for new onset seizures.  He is accompanied by his son [name] today.  History is obtained from them, review of electronic medical records and I personally reviewed available imaging films in PACS.  Patient had an episode on 08/24/2019 of sudden onset of speech difficulties.  He blames this on having stopped Xanax  suddenly which she had been taking long-term 2 days prior.  He had also been unable to sleep the night before.  His son noticed that his speech did not make sense.  The patient called his son over the phone and kept on repeating saying I want to kill [name].  And the son came to his house and noticed him to be confused he was also haddock involuntary rubbing movement of his hands automatically.  He appeared disoriented and had word finding difficulties and trouble completing sentences.  These symptoms lasted about 24 hours and gradually improve the next day.  He was admitted to [facility] where MRI scan of the brain showed no acute abnormality but showed mild generalized atrophy and old left basal ganglia lacune.  EEG on 08/24/2019 showed focal left frontotemporal slowing in the 3 to 6 Hz range.  CBC, BMP and urine drug screen were negative except for  benzos.  Patient was seen by Dr. [name] neurologist who had strong suspicion for complex partial seizure and started up with patient on Keppra .   EKG:  Personally reviewed, sinus rhythm, LAD, borderline LVH, no acute ischemic changes.  QTc of 502  ED Course:  Persistent nausea and vomiting in the ED. treated with Zofran , Reglan , Valium . MRI negative for stroke. RFA for TIA evaluation.  I have requested for neurology consultation for possible TIA, ? Complex seizure given past hx.    Assessment/Plan:  78 y.o. male with hx complex partial seizure disorder (symptoms of speech disturbance, automatisms, confusion, disorientation), HFpEF, HTN, HLD, COPD, BPH, UE lymphedema who was recently seen in the ED for severe range hypertension on 9/12 which improved and discharged, presents now with episode of stroke like symptoms.    Stroke like symptoms - agnosia, dysarthria / aphasia, gait instability (falling to R), and R sided visual deficit / diplopia. + LUE weakness on exam.  R sided headache  ? TIA v complex partial seizure with ? Todds paralysis LKWT 9/13 ~230PM woke from Nap around 3PM with symptoms included above. Exam with LUE weakness. CT Head negative for acute process, suggested old stroke L internal capsule/corona radiata. MRI Brain negative for acute stroke; age related atrophy and mild chronic small vessel ischemic changes noted. Considering his semiology of prior suspected complex partial seizure, suspect this may be underlying cause. No fill of keppra  since 3/'25 for 90day supply; However reports that he has abundant extra supply at home and has not missed dose taking on 500 mg daily.  -Neurology consult, appreciate recommendations -Tentatively plan per below  - AED management per neurology, continue home Keppra  for now.  -Routine EEG -CTA head and neck - Antiplatelet per neurology   - Appreciate neuro input on Hypertension goals, likely can treat severe range HTN > 180  - Check lipids  and A1c - Serial neurochecks  - Telemonitoring - TTE with bubble - PT/OT/SLP - DVT prophylaxis per below - Swallow screen -> NPO due to frequent vomiting + eval for GI process   Epigastric pain, N/V  Abdominal distension  -- KUB eval for obstructive pattern  -- NPO for now, LR at 75 cc/hr while NPO   AHRF During my evaluation, desat to 86% on RA. Placed on O2 6 L initially, goal > 94% with acute neurologic event. CXR prior was clear. Clinically suspect aspiration with his N/V  -- Continue O2 via [place].  -- Aspiration precautions, elevate HOB,  -- NPO for now  -- Albuterol  prn, incentive spirometry   Recent HTN urgency  ED visit 9/12 with systolic into 180s, treated with hydralazine  + improved, and Amlodipine  added to home regimen.  -- For now, hold on his home Amlodipine  and Losartan ; will plan to treat for SBP > 180 pending neurology input   AGMA  -Give 1 L IV fluid -Check lactate  Chronic medical problems: -> with exception of AED ordered home meds to start tomorrow to give him more strict NPO period  Hx complex partial seizure: see management per above.  HFpEF: Without acute exacerbation,  Hyperlipidemia: Continue home simvastatin   COPD: Nebs prn  BPH: Continue tamsulosin  Upper extremity lymphedema: Noted had been evaluated outpatient with no identified obstructive lesion on CT C/A/P with contrast; although no dedicated venous studies. May benefit from seeing vascular OP or having venous imaging to r/o TCVO.  GERD: continue H2 prn, pantoprazole   Abd pain: Continue bentyl  prn  Mood d/o: continue home sertraline , trazodone    Body mass index is 30.28 kg/m. Obesity class I, would benefit from weight loss outpatient    DVT prophylaxis: SCD  Code Status:  Full Code Diet:  Diet Orders (From admission, onward)     Start     Ordered   10/05/23 1959  Diet NPO time specified  Diet effective now       Comments: NPO until stroke  swallow screen is complete   10/05/23 2000            Family Communication:  Yes spoke with son at bedside   Consults:  Neurology   Admission status:   Inpatient, Telemetry bed  Severity of Illness: The appropriate patient status for this patient is INPATIENT. Inpatient status is judged to be reasonable and necessary in order to provide the required intensity of service to ensure the patient's safety. The patient's presenting symptoms, physical exam findings, and initial radiographic and laboratory data in the context of their chronic comorbidities is felt to place them at high risk for further clinical deterioration. Furthermore, it is not anticipated that the patient will be medically stable for discharge from the hospital within 2 midnights of admission.   * I certify that at the point of admission it is my clinical judgment that the patient will require inpatient hospital care spanning beyond 2 midnights from the point of admission due to high intensity of service, high risk for further deterioration and high frequency of surveillance required.*   Dorn Dawson, MD [facility]  How to contact the [facility] Attending or Consulting provider 7A - 7P or covering provider during after hours 7P -7A, for this patient.  Check the care team in [facility] and look for a) attending/consulting [facility] provider listed and b) the [facility] team listed Log into [email or web address] and use [facility]'s universal password to access. If you do not have the password, please contact the hospital operator. Locate the [facility] provider you are looking for under [facility] and page to a number that you can be directly reached. If you still have difficulty reaching the provider, please page the [facility] (Director on Call) for the Hospitalists listed on amion for assistance.  10/06/2023, 4:37 AM

## 2023-10-06 NOTE — Significant Event (Addendum)
 Pt confused, hostile towards staff, attempting to leave room. Security called to assist with pt. Telemetry called right when pt got out of chair to leave room stating pt was in Afib with HR 147. MD notified for new arrhythmia and hostility and agitation. Pt refused EKG. On call Dr. Sundil ordered IV valium  and Benadryl  to aid agitation. EKG obtained showing Afib RVR with ST and T wave abnormalities.    MD reviewed EKG, requested new EKG. Pt refused. Pt agitated, non compliant with safety precautions. Pt took off telemetry and attempted to remove IV. Pt refused IVF. IVF held for time being. Pt states he doesn't know what's in IVF, irrational. Fears medications. Pt complains of back pain and moves from bed to chair. Pt agitated with bed alarm and chair alarms. Pt picks up chair alarm and throws to side. Pt only compliant to sit with alarm gone, refuses to call for assistance. Pt confused and increasingly agitated.   Pt refused labs. On call Dr. Lee notified. Pt attempts to leave room multiple times. Staff constantly reorienting and directing pt. Medications ordered for agitation and anxiety. Medications worked to calm pt for a short time and than pt got out of bed and attempted to leave. Pt remains irritable and confused. Unable to state where he is or why he is here. Increasingly agitated and non compliant with redirecting. Pt verbally aggressive to staff. Security called again to assist pt back to bed. Pt attempted to lock himself in bathroom. Pt attempting to pull out IV. Pt removed telemetry. Pt eventually placed in restraints and telemetry reinitiated. Pt educated on purpose of restraints and offered urinal or external catheter. Pt refused all. Once restored telemetry now showing NSR. Asked pt if he would like us  to call his son regarding restraints. Pt got very angry and stated you better not while gritting teeth and baring them.  Pt offered urinal often, pt refuses. Pt agreeable to suction  catheter. Catheter placed. Pt then ripped of catheter once RN left room. Pt moving around in bed pulling at restraints and IVF. Pt asking to go to bathroom in private. Educated pt on restraints and again offered urinal. Pt verbally aggressive and asking for knife  to do things to you. Pt attempting to spit at RN when RN stated pt would not be provided with knife. MD notified of persistent agitation and aggression. Telemetry remains in place displaying NSR. Medication given for agitation/anxiety. Pt finally fell asleep around 0530.

## 2023-10-06 NOTE — ED Provider Notes (Signed)
 [place] EMERGENCY DEPARTMENT AT [facility] Provider Note   CSN: [id number] Arrival date & time: 10/05/23  1934     Patient presents with: Altered Mental Status   Eric Cook is a 78 y.o. male.   78 year old male brought in by son. Patient reports history of difficulty controlling his blood pressure for several months, went to [facility] Friday for headaches and was dc. Patient was having his usual day today, woke up from a nap at 2pm and felt dizzy so he sat back down and fell back asleep. Patient woke up again about 30 minutes later and couldn't operate his cell phone, couldn't remember how to operate the phone.  He was eventually able to call his son.  Patient's son states that when he called it he was not making any sense, stating receipt, Christmas.  Son went to the patient's house and states by the time he arrived he was mentating a little bit better.  He was able to ambulate out to the car and brought him to the emergency room.  States that mental status has been back to baseline while he is been in the lobby waiting, has had intermittent right sided headaches, intermittent nausea which she attributes to indigestion.  He is also developed a cough today and occasional chills.  No specific known sick contacts although was in the ER on Friday.       Prior to Admission medications   Medication Sig Start Date End Date Taking? Authorizing Provider  acetaminophen  (TYLENOL ) 500 MG tablet Take 500 mg by mouth as needed for moderate pain or mild pain (As Needed).   Yes [provider]  amLODipine  (NORVASC ) 5 MG tablet Take 1 tablet (5 mg total) by mouth daily. 10/04/23  Yes Curatolo, Adam, DO  cimetidine  (TAGAMET ) 400 MG tablet TAKE 1 TABLET TWICE DAILY AS NEEDED 07/15/23  Yes Gottschalk, Ashly M, DO  furosemide  (LASIX ) 20 MG tablet TAKE 1 TABLET (20 MG TOTAL) BY MOUTH DAILY AS NEEDED FOR FLUID. 01/09/23  Yes Gottschalk, Ashly M, DO  levETIRAcetam  (KEPPRA  XR) 500 MG  24 hr tablet Take 1 tablet (500 mg total) by mouth at bedtime. 02/20/23  Yes McCue, Harlene, NP  meloxicam (MOBIC) 15 MG tablet Take 15 mg by mouth daily. 09/20/23  Yes [provider]  sertraline  (ZOLOFT ) 50 MG tablet TAKE 1 TABLET BY MOUTH EVERY DAY 06/04/23  Yes Hawks, Bari A, FNP  simvastatin  (ZOCOR ) 20 MG tablet TAKE 1 TABLET EVERY DAY AT 6 PM 08/15/23  Yes Gottschalk, Ashly M, DO  tamsulosin  (FLOMAX ) 0.4 MG CAPS capsule TAKE 1 CAPSULE EVERY DAY 07/29/23  Yes Gottschalk, Ashly M, DO  traZODone  (DESYREL ) 50 MG tablet TAKE 0.5-1 TABLETS BY MOUTH AT BEDTIME AS NEEDED FOR SLEEP. 11/21/22  Yes Gottschalk, Ashly M, DO  dicyclomine  (BENTYL ) 10 MG capsule Take 2 capsules (20 mg total) by mouth 3 (three) times daily before meals. TAKE 2 CAPSULES (20 MG) 15 MINUTES BEFORE MEALS AS NEEDED; Patient not taking: Reported on 10/06/2023 08/09/23   Abran Norleen SAILOR, MD  fluticasone  (FLONASE ) 50 MCG/ACT nasal spray USE 2 SPRAYS IN EACH NOSTRIL EVERY DAY Patient not taking: Reported on 10/06/2023 08/13/22   Jolinda Norene HERO, DO  losartan  (COZAAR ) 100 MG tablet Take 1 tablet (100 mg total) by mouth daily. Patient not taking: Reported on 10/06/2023 07/29/23   Jolinda Norene HERO, DO    Allergies: Patient has no known allergies.    Review of Systems Negative except  as per HPI Updated Vital Signs BP (!) 143/111   Pulse (!) 105   Temp 98.4 F (36.9 C) (Oral)   Resp (!) 22   Ht 5' 9 (1.753 m)   Wt 93 kg   SpO2 100%   BMI 30.28 kg/m   Physical Exam Vitals and nursing note reviewed.  Constitutional:      General: He is not in acute distress.    Appearance: He is well-developed. He is not diaphoretic.  HENT:     Head: Normocephalic and atraumatic.     Mouth/Throat:     Mouth: Mucous membranes are dry.  Cardiovascular:     Rate and Rhythm: Normal rate and regular rhythm.     Pulses: Normal pulses.     Heart sounds: Normal heart sounds.  Pulmonary:     Effort: Pulmonary effort is normal.      Breath sounds: Normal breath sounds.  Abdominal:     Palpations: Abdomen is soft.     Tenderness: There is no abdominal tenderness.  Musculoskeletal:     Right lower leg: No edema.     Left lower leg: No edema.  Skin:    General: Skin is warm and dry.  Neurological:     Mental Status: He is alert and oriented to person, place, and time.     GCS: GCS eye subscore is 4. GCS verbal subscore is 5. GCS motor subscore is 6.     Cranial Nerves: Cranial nerves 2-12 are intact. No cranial nerve deficit or facial asymmetry.     Sensory: Sensation is intact.     Motor: Motor function is intact. No weakness or pronator drift.     Coordination: Heel to Shin Test normal.  Psychiatric:        Behavior: Behavior normal.     (all labs ordered are listed, but only abnormal results are displayed) Labs Reviewed  COMPREHENSIVE METABOLIC PANEL WITH GFR - Abnormal; Notable for the following components:      Result Value   CO2 20 (*)    Glucose, Bld 124 (*)    Anion gap 16 (*)    All other components within normal limits  URINALYSIS, ROUTINE W REFLEX MICROSCOPIC - Abnormal; Notable for the following components:   Color, Urine AMBER (*)    APPearance HAZY (*)    All other components within normal limits  CBC - Abnormal; Notable for the following components:   WBC 11.2 (*)    All other components within normal limits  LIPID PANEL - Abnormal; Notable for the following components:   HDL 33 (*)    LDL Cholesterol 107 (*)    All other components within normal limits  I-STAT CHEM 8, ED - Abnormal; Notable for the following components:   Glucose, Bld 124 (*)    Calcium , Ion 1.13 (*)    TCO2 21 (*)    All other components within normal limits  RESP PANEL BY RT-PCR (RSV, FLU A&B, COVID)  RVPGX2  ETHANOL  PROTIME-INR  APTT  CBC  DIFFERENTIAL  RAPID URINE DRUG SCREEN, HOSP PERFORMED  LACTIC ACID, PLASMA  HEMOGLOBIN A1C  TSH  BASIC METABOLIC PANEL WITH GFR  MAGNESIUM   PHOSPHORUS  TROPONIN I  (HIGH SENSITIVITY)  TROPONIN I (HIGH SENSITIVITY)    EKG: EKG Interpretation Date/Time:  Saturday October 05 2023 19:49:10 EDT Ventricular Rate:  86 PR Interval:  156 QRS Duration:  90 QT Interval:  420 QTC Calculation: 502 R Axis:   -38  Text Interpretation:  Normal sinus rhythm Left axis deviation Prolonged QT PREVIOUS ECG IS PRESENT Confirmed by Franklyn Gills [id number]) on 10/06/2023 1:13:19 AM  Radiology: MR BRAIN WO CONTRAST Result Date: 10/06/2023 CLINICAL DATA:  Initial evaluation for acute neuro deficit, stroke suspected. EXAM: MRI HEAD WITHOUT CONTRAST TECHNIQUE: Multiplanar, multiecho pulse sequences of the brain and surrounding structures were obtained without intravenous contrast. COMPARISON:  CT from 10/05/2023 FINDINGS: Brain: Examination moderately degraded by motion artifact. Diffuse prominence of the CSF containing spaces compatible generalized cerebral atrophy. Patchy and confluent T2/FLAIR hyperintensity involving the periventricular deep white matter both cerebral hemispheres as well as the pons, consistent with chronic small vessel ischemic disease, mild for age. No evidence for acute or subacute infarct. Gray-white matter differentiation maintained. No is a chronic cortical infarction. No visible acute or chronic intracranial blood products. No mass lesion, midline shift or mass effect. No hydrocephalus or extra-axial fluid collection. Pituitary gland within normal limits for age Vascular: Major intracranial vascular flow voids are maintained. Skull and upper cervical spine: Craniocervical junction within normal limits. Bone marrow signal intensity normal. No scalp soft tissue abnormality. Sinuses/Orbits: Globes and orbital soft tissues within normal limits. Paranasal sinuses are largely clear. No significant mastoid effusion. Other: None. IMPRESSION: 1. No acute intracranial abnormality. 2. Generalized age-related cerebral atrophy with mild chronic small vessel ischemic disease.  Electronically Signed   By: Morene Hoard M.D.   On: 10/06/2023 02:10   DG Chest Port 1 View Result Date: 10/06/2023 CLINICAL DATA:  Cough EXAM: PORTABLE CHEST 1 VIEW COMPARISON:  Film from 2 days previous FINDINGS: Cardiac shadow is stable. Aortic calcifications are noted. Elevation of the right hemidiaphragm is seen. No focal infiltrate or effusion is noted. No bony abnormality is seen. IMPRESSION: No acute abnormality noted. Electronically Signed   By: Oneil Devonshire M.D.   On: 10/06/2023 01:06   CT HEAD WO CONTRAST Result Date: 10/05/2023 EXAM: CT HEAD WITHOUT CONTRAST 10/05/2023 08:20:00 PM TECHNIQUE: CT of the head was performed without the administration of intravenous contrast. Automated exposure control, iterative reconstruction, and/or weight based adjustment of the mA/kV was utilized to reduce the radiation dose to as low as reasonably achievable. COMPARISON: None available. CLINICAL HISTORY: Neuro deficit, acute, stroke suspected. AMS, HA FINDINGS: BRAIN AND VENTRICLES: No acute hemorrhage. No evidence of acute infarct. No hydrocephalus. No extra-axial collection. No mass effect or midline shift. Parenchymal volume loss is commensurate with the patient's age. Periventricular white matter changes are present likely reflecting the sequela of small vessel ischemia. Remote lacunar infarct within the anterior left internal capsule and left corona radiata. ORBITS: No acute abnormality. SINUSES: No acute abnormality. SOFT TISSUES AND SKULL: No acute soft tissue abnormality. No skull fracture. IMPRESSION: 1. No acute intracranial abnormality. 2. Parenchymal volume loss commensurate with the patient's age. 3. Periventricular white matter changes likely reflecting the sequela of small vessel ischemia. 4. Remote lacunar infarct within the anterior left internal capsule and left corona radiata. Electronically signed by: Dorethia Molt MD 10/05/2023 08:34 PM EDT RP Workstation: [id number]   DG Chest 2  View Result Date: 10/04/2023 CLINICAL DATA:  Pain EXAM: DG CHEST 2V COMPARISON:  August 09, 2022 FINDINGS: The heart size and mediastinal contours are within normal limits. Aorta is prominent and calcified. Increased lung volumes suggestive of COPD. Increased interstitial marking of both lung fields. No new pulmonary opacity or nodule. The visualized skeletal structures are unremarkable.No pleural effusion or pneumothorax. IMPRESSION: No active cardiopulmonary disease. Electronically Signed   By: Megan  Zare M.D.  On: 10/04/2023 11:44     Procedures   Medications Ordered in the ED  sodium chloride  flush (NS) 0.9 % injection 3 mL (has no administration in time range)  acetaminophen  (TYLENOL ) tablet 1,000 mg (has no administration in time range)  albuterol  (PROVENTIL ) (2.5 MG/3ML) 0.083% nebulizer solution 2.5 mg (has no administration in time range)  melatonin tablet 6 mg (has no administration in time range)  polyethylene glycol (MIRALAX  / GLYCOLAX ) packet 17 g (has no administration in time range)  prochlorperazine  (COMPAZINE ) injection 10 mg (has no administration in time range)   stroke: early stages of recovery book (has no administration in time range)  potassium chloride  10 mEq in 100 mL IVPB (10 mEq Intravenous New Bag/Given 10/06/23 0600)  lactated ringers  infusion (has no administration in time range)  simvastatin  (ZOCOR ) tablet 20 mg (has no administration in time range)  sertraline  (ZOLOFT ) tablet 50 mg (has no administration in time range)  traZODone  (DESYREL ) tablet 25-50 mg (has no administration in time range)  famotidine  (PEPCID ) tablet 20 mg (has no administration in time range)  tamsulosin  (FLOMAX ) capsule 0.4 mg (has no administration in time range)  levETIRAcetam  (KEPPRA  XR) 24 hr tablet 500 mg (has no administration in time range)  pantoprazole  (PROTONIX ) injection 40 mg (has no administration in time range)  morphine  (PF) 2 MG/ML injection 2 mg (has no administration in  time range)  ondansetron  (ZOFRAN ) injection 4 mg (4 mg Intravenous Given 10/06/23 0107)  diazepam  (VALIUM ) injection 2.5 mg (2.5 mg Intravenous Given 10/06/23 0111)  metoCLOPramide  (REGLAN ) injection 5 mg (5 mg Intravenous Given 10/06/23 0408)  lactated ringers  bolus 1,000 mL (1,000 mLs Intravenous New Bag/Given 10/06/23 0600)                                    Medical Decision Making Amount and/or Complexity of Data Reviewed Radiology: ordered.  Risk Prescription drug management. Decision regarding hospitalization.   This patient presents to the ED for concern of altered mental status, this involves an extensive number of treatment options, and is a complaint that carries with it a high risk of complications and morbidity.  The differential diagnosis includes but not limited to CVA/TIA, ACS, arrhythmia, GERD, viral illness (COVID/flu/rsv), UTI, polypharmacy, electrolyte/metabolic    Co morbidities / Chronic conditions that complicate the patient evaluation  Hypertension, generalized anxiety disorder, COPD, CHF, hyperlipidemia, peptic ulcer.  Reports history of seizure several years ago.   Additional history obtained:  Additional history obtained from EMR Son at bedside who contributes to history as above External records from outside source obtained and reviewed including prior labs and imaging on file   Lab Tests:  I Ordered, and personally interpreted labs.  The pertinent results include: CBC within normal limits.  CMP without significant findings.  Urinalysis is unremarkable.  UDS negative, viral swab negative for COVID, flu, RSV.  Alcohol negative.  Initial troponin is negative at 5.  Delta troponin pending.   Imaging Studies ordered:  I ordered imaging studies including CT head, chest x-ray, MRI brain I independently visualized and interpreted imaging which showed CT head concerning for remote infarct, MRI negative.  Chest x-ray without acute process. I agree with the  radiologist interpretation   Cardiac Monitoring: / EKG:  The patient was maintained on a cardiac monitor.  I personally viewed and interpreted the cardiac monitored which showed an underlying rhythm of: Normal sinus rhythm, rate 86  Problem List / ED Course / Critical interventions / Medication management  78 year old male presents from home with son.  Patient with difficult control blood pressure for the past few months, seen at [facility] on Friday with headache.  Today had an episode where he woke up from a nap and felt dizzy, went back to sleep and when he woke up was having difficulty operating his cell phone.  He was eventually able to call his son and son reports speech was slurred and nonsensical.  Son drove to patient's house and on arrival, speech was improving, speech normal by time of arrival in the emergency room tonight.  Workup has been largely unrevealing.  Patient with persistent nausea, occasional emesis.  Plan is to admit for further workup. I ordered medication including Valium , Zofran , Reglan  Reevaluation of the patient after these medicines showed that the patient remained nauseous I have reviewed the patients home medicines and have made adjustments as needed   Consultations Obtained:  I requested consultation with the ER attending, Dr. Franklyn,  and discussed lab and imaging findings as well as pertinent plan - they recommend: Consult hospitalist for admission for TIA Case discussed with Dr. Keturah with [facility] hospitalist service will consult for admission, requests neuro evaluation while admitted.  Case discussed with Dr. Lindzen who has seen the patient.   Social Determinants of Health:  Lives at home   Test / Admission - Considered:  Admit      Final diagnoses:  TIA (transient ischemic attack)  Nausea and vomiting, unspecified vomiting type    ED Discharge Orders     None          Beverley Leita LABOR, PA-C 10/06/23 [id number]    Franklyn Sid SAILOR,  MD 10/07/23 [phone]

## 2023-10-06 NOTE — Plan of Care (Signed)
  Problem: Education: Goal: Knowledge of secondary prevention will improve (MUST DOCUMENT ALL) 10/06/2023 1824 by Jobie Dena RAMAN, RN Outcome: Progressing 10/06/2023 1824 by Jobie Dena RAMAN, RN Outcome: Progressing   Problem: Ischemic Stroke/TIA Tissue Perfusion: Goal: Complications of ischemic stroke/TIA will be minimized 10/06/2023 1824 by Jobie Dena RAMAN, RN Outcome: Progressing 10/06/2023 1824 by Jobie Dena RAMAN, RN Outcome: Progressing   Problem: Coping: Goal: Will verbalize positive feelings about self 10/06/2023 1824 by Jobie Dena RAMAN, RN Outcome: Progressing 10/06/2023 1824 by Jobie Dena RAMAN, RN Outcome: Progressing

## 2023-10-06 NOTE — ED Notes (Signed)
Patient transported to  mri 

## 2023-10-06 NOTE — Progress Notes (Signed)
 PT Cancellation Note  Patient Details Name: Eric Cook MRN: [id number] DOB: January 09, 1946   Cancelled Treatment:    Reason Eval/Treat Not Completed: Patient at procedure or test/unavailable. Pt starting EEG.   Rodgers ORN [facility] 10/06/2023, 8:22 AM Rodgers Opal PT Acute [employer] [phone]

## 2023-10-06 NOTE — Progress Notes (Signed)
 OT Cancellation Note  Patient Details Name: Eric Cook MRN: [id number] DOB: 1945-07-03   Cancelled Treatment:    Reason Eval/Treat Not Completed: OT screened, no needs identified, will sign off. Per PT, pt is back to his functional baseline. Please re-consult if change in status.   Jerriyah Louis D Walton, OTD, OTR/L [facility] Acute [facility] Office: [phone]   Elma JONETTA Penner 10/06/2023, 11:00 AM

## 2023-10-06 NOTE — ED Notes (Signed)
 Patient unable to tolerate Kcl, so MIVF rate upped for further dilution.

## 2023-10-06 NOTE — Progress Notes (Signed)
 [facility] PROGRESS NOTE    Progress Note  Eric Cook  FMW:[id number] DOB: 10/24/1945 DOA: 10/05/2023 PCP: Jolinda Norene HERO, DO     Brief Narrative:   Eric Cook is an 78 y.o. male past medical history complex partial seizures, HFpEF, essential hypertension, BPH, chronic lymphedema, seen in the ED recently for hypertensive urgency some medications were added and discharged home, since then he has been slightly confused, son also noted some slurred speech and nonsensical wording.  Woke up in the morning of admission with right-sided headache,some left-sided weakness blurry vision and double vision which is now resolved, but he still has some left hand weakness.  He also has some abdominal pain with nausea and vomiting in the ED  Significant Events: MRI of the brain showed no acute findings just generalized atrophy. Chest x-ray no acute findings  Assessment/Plan:  Strokelike symptoms: Insert about TIA versus complex breakthrough partial seizures with Todd's paralysis: MRI of the brain showed no acute findings CT angio of the head and neck showed radiographic stranding Sign stenosis of right ICA and left ICA high-grade stenosis. Neurology was consulted.  Recommended to continue DAPT therapy aspirin  and Plavix  for 21 days then aspirin  alone. Allow permissive hypertension. CTA of the head EEG is pending. CTA of the head and neck has been ordered. PT OT, 2D echo and SLP are pending. A1c is 5.0, 2D echo Monitor on cardiac telemetry keep n.p.o. until swallow evaluation is done.  Epigastric abdominal pain nausea vomiting: N.p.o. started on IV fluids.  Continue Protonix  twice a day. CT scan of the abdomen pelvis pending. KUB Show nonspecific bowel gas pattern.  Acute respiratory failure with hypoxia: Was satting 86% on room air placed on 6 L now greater than 90. Chest x-ray is clear. Of unclear etiology. Continue sensi spirometry inhalers. N.p.o. for now concerned  about aspiration. Has remained afebrile with mild leukocytosis.  Recent HTN (hypertension) urgency: Start amlodipine  hold losartan . Awaiting neurology further recommendations.  High anion gap metabolic acidosis: Started on IV fluids, lactate is unremarkable. Unclear etiology, recheck basic metabolic panel tomorrow morning.  History of complex partial seizures: Resume Keppra .  Management as above.  HFpEF: Appears euvolemic without exacerbation.  COPD, Continue inhalers.  BPH (benign prostatic hyperplasia) Resume Flomax .  Closed compression fracture of L3 lumbar vertebra, initial encounter (HCC) Noted.  DVT prophylaxis: lovenox  Family Communication:non Status is: Observation The patient remains OBS appropriate and will d/c before 2 midnights.    Code Status:     Code Status Orders  (From admission, onward)           Start     Ordered   10/06/23 0516  Full code  Continuous       Question:  By:  Answer:  Other   10/06/23 0518           Code Status History     Date Active Date Inactive Code Status Order ID Comments User Context   08/03/2022 1524 08/04/2022 2228 Full Code [id number]  Laurita Cort DASEN, MD ED   08/23/2019 1527 08/25/2019 2327 Full Code [id number]  Norins, Ozell BRAVO, MD ED         IV Access:   Peripheral IV   Procedures and diagnostic studies:   MR BRAIN WO CONTRAST Result Date: 10/06/2023 CLINICAL DATA:  Initial evaluation for acute neuro deficit, stroke suspected. EXAM: MRI HEAD WITHOUT CONTRAST TECHNIQUE: Multiplanar, multiecho pulse sequences of the brain and surrounding structures were obtained without intravenous contrast. COMPARISON:  CT  from 10/05/2023 FINDINGS: Brain: Examination moderately degraded by motion artifact. Diffuse prominence of the CSF containing spaces compatible generalized cerebral atrophy. Patchy and confluent T2/FLAIR hyperintensity involving the periventricular deep white matter both cerebral hemispheres as well as the  pons, consistent with chronic small vessel ischemic disease, mild for age. No evidence for acute or subacute infarct. Gray-white matter differentiation maintained. No is a chronic cortical infarction. No visible acute or chronic intracranial blood products. No mass lesion, midline shift or mass effect. No hydrocephalus or extra-axial fluid collection. Pituitary gland within normal limits for age Vascular: Major intracranial vascular flow voids are maintained. Skull and upper cervical spine: Craniocervical junction within normal limits. Bone marrow signal intensity normal. No scalp soft tissue abnormality. Sinuses/Orbits: Globes and orbital soft tissues within normal limits. Paranasal sinuses are largely clear. No significant mastoid effusion. Other: None. IMPRESSION: 1. No acute intracranial abnormality. 2. Generalized age-related cerebral atrophy with mild chronic small vessel ischemic disease. Electronically Signed   By: Morene Hoard M.D.   On: 10/06/2023 02:10   DG Chest Port 1 View Result Date: 10/06/2023 CLINICAL DATA:  Cough EXAM: PORTABLE CHEST 1 VIEW COMPARISON:  Film from 2 days previous FINDINGS: Cardiac shadow is stable. Aortic calcifications are noted. Elevation of the right hemidiaphragm is seen. No focal infiltrate or effusion is noted. No bony abnormality is seen. IMPRESSION: No acute abnormality noted. Electronically Signed   By: Oneil Devonshire M.D.   On: 10/06/2023 01:06   CT HEAD WO CONTRAST Result Date: 10/05/2023 EXAM: CT HEAD WITHOUT CONTRAST 10/05/2023 08:20:00 PM TECHNIQUE: CT of the head was performed without the administration of intravenous contrast. Automated exposure control, iterative reconstruction, and/or weight based adjustment of the mA/kV was utilized to reduce the radiation dose to as low as reasonably achievable. COMPARISON: None available. CLINICAL HISTORY: Neuro deficit, acute, stroke suspected. AMS, HA FINDINGS: BRAIN AND VENTRICLES: No acute hemorrhage. No evidence  of acute infarct. No hydrocephalus. No extra-axial collection. No mass effect or midline shift. Parenchymal volume loss is commensurate with the patient's age. Periventricular white matter changes are present likely reflecting the sequela of small vessel ischemia. Remote lacunar infarct within the anterior left internal capsule and left corona radiata. ORBITS: No acute abnormality. SINUSES: No acute abnormality. SOFT TISSUES AND SKULL: No acute soft tissue abnormality. No skull fracture. IMPRESSION: 1. No acute intracranial abnormality. 2. Parenchymal volume loss commensurate with the patient's age. 3. Periventricular white matter changes likely reflecting the sequela of small vessel ischemia. 4. Remote lacunar infarct within the anterior left internal capsule and left corona radiata. Electronically signed by: Dorethia Molt MD 10/05/2023 08:34 PM EDT RP Workstation: [id number]   DG Chest 2 View Result Date: 10/04/2023 CLINICAL DATA:  Pain EXAM: DG CHEST 2V COMPARISON:  August 09, 2022 FINDINGS: The heart size and mediastinal contours are within normal limits. Aorta is prominent and calcified. Increased lung volumes suggestive of COPD. Increased interstitial marking of both lung fields. No new pulmonary opacity or nodule. The visualized skeletal structures are unremarkable.No pleural effusion or pneumothorax. IMPRESSION: No active cardiopulmonary disease. Electronically Signed   By: Megan  Zare M.D.   On: 10/04/2023 11:44     Medical Consultants:   None.   Subjective:    Eric Cook complaining of being nauseated.  Objective:    Vitals:   10/06/23 0015 10/06/23 0235 10/06/23 0447 10/06/23 0500  BP: (!) 176/82 (!) 144/90  (!) 159/81  Pulse: 85 85  89  Resp: 20 (!) 24  20  Temp:  98.1 F (36.7 C)  98.4 F (36.9 C)   TempSrc: Oral  Oral   SpO2: 94% 91%  90%  Weight:      Height:       SpO2: 90 %   Intake/Output Summary (Last 24 hours) at 10/06/2023 0624 Last data filed at 10/06/2023  0604 Gross per 24 hour  Intake 50 ml  Output --  Net 50 ml   Filed Weights   10/05/23 2007  Weight: 93 kg    Exam: General exam: In no acute distress. Respiratory system: Good air movement and clear to auscultation. Cardiovascular system: S1 & S2 heard, RRR. No JVD.  Gastrointestinal system: Abdomen is nondistended, soft and nontender.  Extremities: No pedal edema. Skin: No rashes, lesions or ulcers Psychiatry: Judgement and insight appear normal. Mood & affect appropriate.    Data Reviewed:    Labs: Basic Metabolic Panel: Recent Labs  Lab 10/04/23 1126 10/05/23 1951 10/05/23 2008  NA 139 139 140  K 4.1 3.5 3.6  CL 103 104 104  CO2 22  --  20*  GLUCOSE 98 124* 124*  BUN 17 23 21   CREATININE 1.20 1.20 1.15  CALCIUM  10.4*  --  9.4   GFR Estimated Creatinine Clearance: 60.6 mL/min (by C-G formula based on SCr of 1.15 mg/dL). Liver Function Tests: Recent Labs  Lab 10/05/23 2008  AST 31  ALT 23  ALKPHOS 75  BILITOT 1.0  PROT 7.3  ALBUMIN 4.6   No results for input(s): LIPASE, AMYLASE in the last 168 hours. No results for input(s): AMMONIA in the last 168 hours. Coagulation profile Recent Labs  Lab 10/05/23 2008  INR 1.0   COVID-19 Labs  No results for input(s): DDIMER, FERRITIN, LDH, CRP in the last 72 hours.  Lab Results  Component Value Date   SARSCOV2NAA NEGATIVE 10/06/2023   SARSCOV2NAA NEGATIVE 08/09/2022   SARSCOV2NAA NEGATIVE 08/03/2022   SARSCOV2NAA Not Detected 10/13/2020    CBC: Recent Labs  Lab 10/04/23 1126 10/05/23 1951 10/05/23 2008 10/06/23 0511  WBC 5.6  --  8.0 11.2*  NEUTROABS 3.1  --  5.8  --   HGB 14.0 14.6 14.2 14.3  HCT 40.0 43.0 41.5 42.2  MCV 94.1  --  95.6 95.5  PLT 183  --  222 215   Cardiac Enzymes: No results for input(s): CKTOTAL, CKMB, CKMBINDEX, TROPONINI in the last 168 hours. BNP (last 3 results) Recent Labs    07/18/23 1010  PROBNP 746.0*   CBG: No results for input(s):  GLUCAP in the last 168 hours. D-Dimer: No results for input(s): DDIMER in the last 72 hours. Hgb A1c: Recent Labs    10/06/23 0511  HGBA1C 5.0   Lipid Profile: Recent Labs    10/06/23 0511  CHOL 158  HDL 33*  LDLCALC 107*  TRIG 89  CHOLHDL 4.8   Thyroid  function studies: No results for input(s): TSH, T4TOTAL, T3FREE, THYROIDAB in the last 72 hours.  Invalid input(s): FREET3 Anemia work up: No results for input(s): VITAMINB12, FOLATE, FERRITIN, TIBC, IRON, RETICCTPCT in the last 72 hours. Sepsis Labs: Recent Labs  Lab 10/04/23 1126 10/05/23 2008 10/06/23 0511  WBC 5.6 8.0 11.2*  LATICACIDVEN  --   --  1.5   Microbiology Recent Results (from the past 240 hours)  Resp panel by RT-PCR (RSV, Flu A&B, Covid) Anterior Nasal Swab     Status: None   Collection Time: 10/06/23 12:47 AM   Specimen: Anterior Nasal Swab  Result Value Ref Range Status  SARS Coronavirus 2 by RT PCR NEGATIVE NEGATIVE Final   Influenza A by PCR NEGATIVE NEGATIVE Final   Influenza B by PCR NEGATIVE NEGATIVE Final    Comment: (NOTE) The Xpert Xpress SARS-CoV-2/FLU/RSV plus assay is intended as an aid in the diagnosis of influenza from Nasopharyngeal swab specimens and should not be used as a sole basis for treatment. Nasal washings and aspirates are unacceptable for Xpert Xpress SARS-CoV-2/FLU/RSV testing.  Fact Sheet for Patients: BloggerCourse.com  Fact Sheet for Healthcare Providers: SeriousBroker.it  This test is not yet approved or cleared by the [place]  FDA and has been authorized for detection and/or diagnosis of SARS-CoV-2 by FDA under an Emergency Use Authorization (EUA). This EUA will remain in effect (meaning this test can be used) for the duration of the COVID-19 declaration under Section 564(b)(1) of the Act, 21 U.S.C. section 360bbb-3(b)(1), unless the authorization is terminated or revoked.      Resp Syncytial Virus by PCR NEGATIVE NEGATIVE Final    Comment: (NOTE) Fact Sheet for Patients: BloggerCourse.com  Fact Sheet for Healthcare Providers: SeriousBroker.it  This test is not yet approved or cleared by the [place]  FDA and has been authorized for detection and/or diagnosis of SARS-CoV-2 by FDA under an Emergency Use Authorization (EUA). This EUA will remain in effect (meaning this test can be used) for the duration of the COVID-19 declaration under Section 564(b)(1) of the Act, 21 U.S.C. section 360bbb-3(b)(1), unless the authorization is terminated or revoked.  Performed at [facility], [address], [place], [place] [address]      Medications:    [START ON 10/07/2023]  stroke: early stages of recovery book   Does not apply Once   enoxaparin  (LOVENOX ) injection  40 mg Subcutaneous Q24H   methylPREDNISolone  acetate  40 mg Intramuscular Once   sodium chloride  flush  3 mL Intravenous Q12H   Continuous Infusions:  potassium chloride  10 mEq (10/06/23 0600)      LOS: 0 days   Erle Odell Castor  [facility]  10/06/2023, 6:24 AM

## 2023-10-06 NOTE — ED Notes (Signed)
 Patient unable to tolerate food due to nausea. Family requesting status on CT abdomen, provider contacted.

## 2023-10-06 NOTE — Progress Notes (Addendum)
 EEG complete - results pending

## 2023-10-06 NOTE — ED Notes (Signed)
 Patient transported to CT

## 2023-10-07 ENCOUNTER — Inpatient Hospital Stay (HOSPITAL_COMMUNITY)

## 2023-10-07 ENCOUNTER — Ambulatory Visit: Admitting: Nurse Practitioner

## 2023-10-07 ENCOUNTER — Other Ambulatory Visit: Payer: Self-pay

## 2023-10-07 DIAGNOSIS — G459 Transient cerebral ischemic attack, unspecified: Principal | ICD-10-CM | POA: Insufficient documentation

## 2023-10-07 DIAGNOSIS — R569 Unspecified convulsions: Secondary | ICD-10-CM | POA: Diagnosis not present

## 2023-10-07 DIAGNOSIS — I6522 Occlusion and stenosis of left carotid artery: Secondary | ICD-10-CM

## 2023-10-07 DIAGNOSIS — R4182 Altered mental status, unspecified: Secondary | ICD-10-CM

## 2023-10-07 DIAGNOSIS — I779 Disorder of arteries and arterioles, unspecified: Secondary | ICD-10-CM

## 2023-10-07 DIAGNOSIS — R112 Nausea with vomiting, unspecified: Secondary | ICD-10-CM | POA: Diagnosis not present

## 2023-10-07 DIAGNOSIS — G9341 Metabolic encephalopathy: Secondary | ICD-10-CM | POA: Diagnosis not present

## 2023-10-07 DIAGNOSIS — E785 Hyperlipidemia, unspecified: Secondary | ICD-10-CM

## 2023-10-07 LAB — TSH: TSH: 4.11 u[IU]/mL (ref 0.350–4.500)

## 2023-10-07 LAB — ECHOCARDIOGRAM COMPLETE
Area-P 1/2: 4.1 cm2
Calc EF: 61.9 %
Height: 69 in
S' Lateral: 3.5 cm
Single Plane A2C EF: 56 %
Single Plane A4C EF: 63.9 %
Weight: 3280.44 [oz_av]

## 2023-10-07 LAB — BASIC METABOLIC PANEL WITH GFR
Anion gap: 15 (ref 5–15)
BUN: 11 mg/dL (ref 8–23)
CO2: 19 mmol/L — ABNORMAL LOW (ref 22–32)
Calcium: 9.8 mg/dL (ref 8.9–10.3)
Chloride: 108 mmol/L (ref 98–111)
Creatinine, Ser: 0.88 mg/dL (ref 0.61–1.24)
GFR, Estimated: >60 mL/min (ref >60)
Glucose, Bld: 108 mg/dL — ABNORMAL HIGH (ref 70–99)
Potassium: 3.5 mmol/L (ref 3.5–5.1)
Sodium: 142 mmol/L (ref 135–145)

## 2023-10-07 LAB — CBC
HCT: 42.5 % (ref 39.0–52.0)
Hemoglobin: 14.5 g/dL (ref 13.0–17.0)
MCH: 33 pg (ref 26.0–34.0)
MCHC: 34.1 g/dL (ref 30.0–36.0)
MCV: 96.6 fL (ref 80.0–100.0)
Platelets: 179 K/uL (ref 150–400)
RBC: 4.4 MIL/uL (ref 4.22–5.81)
RDW: 12.9 % (ref 11.5–15.5)
WBC: 8.4 K/uL (ref 4.0–10.5)
nRBC: 0 % (ref 0.0–0.2)

## 2023-10-07 LAB — GLUCOSE, CAPILLARY: Glucose-Capillary: 105 mg/dL — ABNORMAL HIGH (ref 70–99)

## 2023-10-07 MED ORDER — LORAZEPAM 2 MG/ML IJ SOLN
1.0000 mg | INTRAMUSCULAR | Status: AC
  Administered 2023-10-07: 1 mg via INTRAMUSCULAR
  Filled 2023-10-07: qty 1

## 2023-10-07 MED ORDER — POTASSIUM CHLORIDE 20 MEQ PO PACK
40.0000 meq | PACK | Freq: Two times a day (BID) | ORAL | Status: DC
  Filled 2023-10-07: qty 2

## 2023-10-07 MED ORDER — PERFLUTREN LIPID MICROSPHERE
1.0000 mL | INTRAVENOUS | Status: AC | PRN
  Administered 2023-10-07: 2 mL via INTRAVENOUS

## 2023-10-07 MED ORDER — MAGNESIUM OXIDE -MG SUPPLEMENT 400 (240 MG) MG PO TABS
400.0000 mg | ORAL_TABLET | Freq: Two times a day (BID) | ORAL | Status: DC
  Filled 2023-10-07: qty 1

## 2023-10-07 MED ORDER — SODIUM CHLORIDE 0.9 % IV SOLN: INTRAVENOUS | Status: AC

## 2023-10-07 MED ORDER — METOPROLOL TARTRATE 25 MG PO TABS
25.0000 mg | ORAL_TABLET | Freq: Two times a day (BID) | ORAL | Status: DC
  Administered 2023-10-07 – 2023-10-09 (×4): 25 mg via ORAL
  Filled 2023-10-07 (×5): qty 1

## 2023-10-07 MED ORDER — METOPROLOL TARTRATE 5 MG/5ML IV SOLN: 5.0000 mg | INTRAVENOUS | Status: DC | PRN

## 2023-10-07 MED ORDER — ZIPRASIDONE MESYLATE 20 MG IM SOLR
10.0000 mg | INTRAMUSCULAR | Status: AC
Start: 2023-10-07 — End: 2023-10-07
  Administered 2023-10-07: 10 mg via INTRAMUSCULAR
  Filled 2023-10-07: qty 20

## 2023-10-07 MED ORDER — HALOPERIDOL LACTATE 5 MG/ML IJ SOLN
1.0000 mg | Freq: Four times a day (QID) | INTRAMUSCULAR | Status: DC | PRN
  Administered 2023-10-07 (×2): 1 mg via INTRAVENOUS
  Filled 2023-10-07 (×2): qty 1

## 2023-10-07 MED ORDER — POTASSIUM CHLORIDE 10 MEQ/100ML IV SOLN
10.0000 meq | INTRAVENOUS | Status: AC
  Administered 2023-10-07 (×5): 10 meq via INTRAVENOUS
  Filled 2023-10-07 (×5): qty 100

## 2023-10-07 MED ORDER — DIAZEPAM 5 MG/ML IJ SOLN
2.5000 mg | INTRAMUSCULAR | Status: AC
  Administered 2023-10-07: 2.5 mg via INTRAVENOUS
  Filled 2023-10-07: qty 2

## 2023-10-07 MED ORDER — STERILE WATER FOR INJECTION IJ SOLN
INTRAMUSCULAR | Status: AC
  Filled 2023-10-07: qty 10

## 2023-10-07 MED ORDER — QUETIAPINE FUMARATE 25 MG PO TABS: 25.0000 mg | ORAL_TABLET | Freq: Every evening | ORAL | Status: DC | PRN

## 2023-10-07 MED ORDER — MAGNESIUM SULFATE 2 GM/50ML IV SOLN
2.0000 g | Freq: Once | INTRAVENOUS | Status: AC
  Administered 2023-10-07: 2 g via INTRAVENOUS
  Filled 2023-10-07: qty 50

## 2023-10-07 MED ORDER — ZIPRASIDONE MESYLATE 20 MG IM SOLR
20.0000 mg | INTRAMUSCULAR | Status: DC
  Filled 2023-10-07: qty 20

## 2023-10-07 MED ORDER — HALOPERIDOL LACTATE 5 MG/ML IJ SOLN
5.0000 mg | Freq: Once | INTRAMUSCULAR | Status: AC
  Administered 2023-10-07: 5 mg via INTRAVENOUS
  Filled 2023-10-07: qty 1

## 2023-10-07 MED ORDER — ZIPRASIDONE MESYLATE 20 MG IM SOLR
20.0000 mg | INTRAMUSCULAR | Status: AC
  Administered 2023-10-07: 20 mg via INTRAMUSCULAR
  Filled 2023-10-07: qty 20

## 2023-10-07 MED ORDER — HALOPERIDOL LACTATE 5 MG/ML IJ SOLN
2.0000 mg | Freq: Four times a day (QID) | INTRAMUSCULAR | Status: DC | PRN
  Administered 2023-10-07: 2 mg via INTRAVENOUS

## 2023-10-07 MED ORDER — STERILE WATER FOR INJECTION IJ SOLN
INTRAMUSCULAR | Status: AC
  Administered 2023-10-07: 10 mL
  Filled 2023-10-07: qty 10

## 2023-10-07 NOTE — Progress Notes (Signed)
 STROKE TEAM PROGRESS NOTE    SIGNIFICANT HOSPITAL EVENTS  9/13: uncontrolled HTN and an episode of confusion during which he could not operate his cellphone   INTERIM HISTORY/SUBJECTIVE Son at the bedside. Son reported that overnight pt became delirious, had hallucination. Received benadryl , valium , ativan  and geodon  x 2 overnight. Currently on round, pt awake alert eye open but still has delirium and not orientated.   OBJECTIVE  CBC    Component Value Date/Time   WBC 8.4 10/07/2023 0609   RBC 4.40 10/07/2023 0609   HGB 14.5 10/07/2023 0609   HGB 13.3 07/01/2023 1107   HCT 42.5 10/07/2023 0609   HCT 41.4 07/01/2023 1107   PLT 179 10/07/2023 0609   PLT 229 07/01/2023 1107   MCV 96.6 10/07/2023 0609   MCV 97 07/01/2023 1107   MCH 33.0 10/07/2023 0609   MCHC 34.1 10/07/2023 0609   RDW 12.9 10/07/2023 0609   RDW 13.7 07/01/2023 1107   LYMPHSABS 1.2 10/05/2023 2008   LYMPHSABS 1.3 07/01/2023 1107   MONOABS 0.9 10/05/2023 2008   EOSABS 0.1 10/05/2023 2008   EOSABS 0.2 07/01/2023 1107   BASOSABS 0.1 10/05/2023 2008   BASOSABS 0.0 07/01/2023 1107    BMET    Component Value Date/Time   NA 142 10/07/2023 0609   NA 140 07/29/2023 1537   K 3.5 10/07/2023 0609   CL 108 10/07/2023 0609   CO2 19 (L) 10/07/2023 0609   GLUCOSE 108 (H) 10/07/2023 0609   BUN 11 10/07/2023 0609   BUN 14 07/29/2023 1537   CREATININE 0.88 10/07/2023 0609   CALCIUM  9.8 10/07/2023 0609   EGFR 59 (L) 07/29/2023 1537   GFRNONAA >60 10/07/2023 0609    IMAGING past 24 hours ECHOCARDIOGRAM COMPLETE Result Date: 10/07/2023    ECHOCARDIOGRAM REPORT   Patient Name:   DARON STUTZ Date of Exam: 10/07/2023 Medical Rec #:  [id number]       Height:       69.0 in Accession #:    [id number]      Weight:       205.0 lb Date of Birth:  10-Nov-1945       BSA:          2.088 m Patient Age:    78 years        BP:           120/78 mmHg Patient Gender: M               HR:           91 bpm. Exam Location:  Inpatient  Procedure: 2D Echo, Cardiac Doppler, Color Doppler and Intracardiac            Opacification Agent (Both Spectral and Color Flow Doppler were            utilized during procedure). Indications:    TIA  History:        Patient has prior history of Echocardiogram examinations, most                 recent 08/04/2022. Abnormal ECG, COPD; Risk Factors:Hypertension                 and Dyslipidemia.  Sonographer:    Ellouise Mose RDCS Referring Phys: [id number] [facility]  Sonographer Comments: Technically difficult study due to poor echo windows. Image acquisition challenging due to patient body habitus. Patient supine with restraints. Could not turn. IMPRESSIONS  1. Left ventricular ejection fraction, by estimation, is  55 to 60%. The left ventricle has normal function. The left ventricle has no regional wall motion abnormalities. Left ventricular diastolic parameters are consistent with Grade I diastolic dysfunction (impaired relaxation).  2. Right ventricular systolic function is normal. The right ventricular size is normal. Tricuspid regurgitation signal is inadequate for assessing PA pressure.  3. The mitral valve is normal in structure. Mild mitral valve regurgitation. No evidence of mitral stenosis.  4. The aortic valve is tricuspid. Aortic valve regurgitation is not visualized. No aortic stenosis is present. FINDINGS  Left Ventricle: Left ventricular ejection fraction, by estimation, is 55 to 60%. The left ventricle has normal function. The left ventricle has no regional wall motion abnormalities. Definity  contrast agent was given IV to delineate the left ventricular  endocardial borders. The left ventricular internal cavity size was normal in size. There is no left ventricular hypertrophy. Left ventricular diastolic parameters are consistent with Grade I diastolic dysfunction (impaired relaxation). Right Ventricle: The right ventricular size is normal. No increase in right ventricular wall thickness. Right  ventricular systolic function is normal. Tricuspid regurgitation signal is inadequate for assessing PA pressure. Left Atrium: Left atrial size was normal in size. Right Atrium: Right atrial size was normal in size. Pericardium: There is no evidence of pericardial effusion. Mitral Valve: The mitral valve is normal in structure. Mild mitral valve regurgitation. No evidence of mitral valve stenosis. Tricuspid Valve: The tricuspid valve is normal in structure. Tricuspid valve regurgitation is trivial. No evidence of tricuspid stenosis. Aortic Valve: The aortic valve is tricuspid. Aortic valve regurgitation is not visualized. No aortic stenosis is present. Pulmonic Valve: The pulmonic valve was normal in structure. Pulmonic valve regurgitation is not visualized. No evidence of pulmonic stenosis. Aorta: The aortic root is normal in size and structure. IAS/Shunts: No atrial level shunt detected by color flow Doppler.  LEFT VENTRICLE PLAX 2D LVIDd:         4.90 cm     Diastology LVIDs:         3.50 cm     LV e' medial:    6.42 cm/s LV PW:         1.10 cm     LV E/e' medial:  13.7 LV IVS:        0.90 cm     LV e' lateral:   9.36 cm/s LVOT diam:     2.10 cm     LV E/e' lateral: 9.4 LV SV:         68 LV SV Index:   33 LVOT Area:     3.46 cm  LV Volumes (MOD) LV vol d, MOD A2C: 73.6 ml LV vol d, MOD A4C: 70.4 ml LV vol s, MOD A2C: 32.4 ml LV vol s, MOD A4C: 25.4 ml LV SV MOD A2C:     41.2 ml LV SV MOD A4C:     70.4 ml LV SV MOD BP:      46.6 ml RIGHT VENTRICLE             IVC RV S prime:     10.10 cm/s  IVC diam: 1.90 cm TAPSE (M-mode): 1.9 cm LEFT ATRIUM             Index        RIGHT ATRIUM           Index LA diam:        2.80 cm 1.34 cm/m   RA Area:     12.10 cm LA Vol (A2C):  30.7 ml 14.70 ml/m  RA Volume:   22.20 ml  10.63 ml/m LA Vol (A4C):   35.5 ml 17.00 ml/m LA Biplane Vol: 33.5 ml 16.04 ml/m  AORTIC VALVE LVOT Vmax:   99.20 cm/s LVOT Vmean:  67.200 cm/s LVOT VTI:    0.196 m  AORTA Ao Root diam: 3.50 cm Ao Asc  diam:  3.60 cm MITRAL VALVE MV Area (PHT): 4.10 cm     SHUNTS MV Decel Time: 185 msec     Systemic VTI:  0.20 m MV E velocity: 88.05 cm/s   Systemic Diam: 2.10 cm MV A velocity: 137.00 cm/s MV E/A ratio:  0.64 Aditya Sabharwal Electronically signed by Ria Commander Signature Date/Time: 10/07/2023/1:06:16 PM    Final    EEG adult Result Date: 10/07/2023 Matthews Elida HERO, MD     10/07/2023  7:28 AM Routine EEG Report EMMANUEL GRUENHAGEN is a 78 y.o. male with a history of altered mental status who is undergoing an EEG to evaluate for seizures. Report: This EEG was acquired with electrodes placed according to the International 10-20 electrode system (including Fp1, Fp2, F3, F4, C3, C4, P3, P4, O1, O2, T3, T4, T5, T6, A1, A2, Fz, Cz, Pz). The following electrodes were missing or displaced: none. The occipital dominant rhythm was 8.5 Hz. This activity is reactive to stimulation. Drowsiness was manifested by background fragmentation; deeper stages of sleep were not identified. There was no focal slowing. There were no interictal epileptiform discharges. There were no electrographic seizures identified. There was no abnormal response to photic stimulation or hyperventilation. Impression: This EEG was obtained while awake and drowsy and is normal.   Clinical Correlation: Normal EEGs, however, do not rule out epilepsy. Elida Matthews, MD [facility] [phone] If 7pm- 7am, please page neurology on call as listed in AMION.   CT ABDOMEN PELVIS W CONTRAST Result Date: 10/06/2023 CLINICAL DATA:  Acute abdominal pain EXAM: CT ABDOMEN AND PELVIS WITH CONTRAST TECHNIQUE: Multidetector CT imaging of the abdomen and pelvis was performed using the standard protocol following bolus administration of intravenous contrast. RADIATION DOSE REDUCTION: This exam was performed according to the departmental dose-optimization program which includes automated exposure control, adjustment of the mA and/or kV according to patient  size and/or use of iterative reconstruction technique. CONTRAST:  75mL OMNIPAQUE  IOHEXOL  350 MG/ML SOLN COMPARISON:  07/12/2023 FINDINGS: Lower chest: Coronary and aortic atherosclerosis. Mild mitral valve calcification. Distal esophageal wall thickening is nonspecific although esophagitis would be a common cause. Emphysema noted. Scarring or atelectasis anteromedially in the right middle lobe. Hepatobiliary: Unremarkable Pancreas: Unremarkable Spleen: Unremarkable Adrenals/Urinary Tract: Contrast medium in the collecting systems in urinary bladder compatible with prior contrast injection for CT angiogram; this lowers sensitivity for nonobstructive renal calculi but no hydronephrosis or hydroureter is present. Stable renal cysts warrant no further imaging workup. Stomach/Bowel: Mild sigmoid colon diverticulosis. Vascular/Lymphatic: Heavy atheromatous vascular calcification of the abdominal aorta and its branches. Atheromatous plaque at the origins of the celiac trunk and SMA and scattered further distally in the SMA. No overt occlusion of either of these vessels. Reproductive: Unremarkable Other: No supplemental non-categorized findings. Musculoskeletal: Compression fractures at T12, L3, L4, and L5. The T12 compression fracture demonstrates about 40% loss of vertebral height (formerly 25% on 07/12/2023) and increased sclerosis compatible with progressive collapse. Small supraumbilical hernia contains adipose tissue. IMPRESSION: 1. Progressive collapse of the T12 compression fracture, now with about 40% loss of vertebral height. 2. Distal esophageal wall thickening is nonspecific although esophagitis would be a  common cause. 3. Mild sigmoid colon diverticulosis. 4. Small supraumbilical hernia contains adipose tissue. 5. Aortic Atherosclerosis (ICD10-I70.0) and Emphysema (ICD10-J43.9). Systemic and coronary atherosclerosis. Mild mitral valve calcification. Electronically Signed   By: Ryan Salvage M.D.   On:  10/06/2023 14:28    Vitals:   10/06/23 1545 10/06/23 2033 10/06/23 2341 10/07/23 0813  BP: (!) 148/67 (!) 150/85 120/78 (!) 160/97  Pulse: 81 90 72 96  Resp: 16 18 16 20   Temp: 100.1 F (37.8 C) 98.3 F (36.8 C) 98 F (36.7 C)   TempSrc: Axillary Oral Oral   SpO2: 94% 96% 92%   Weight:      Height:         PHYSICAL EXAM General:  Alert, well-nourished, well-developed patient in no acute distress CV: Regular rate and rhythm on monitor, some intermittent ST Respiratory:  Regular, unlabored respirations on room air GI: Distended, soft, tender.   NEURO:  Mental Status: AA&Ox3, patient is able to give clear and coherent history Speech/Language: speech is without dysarthria or aphasia.  Naming, repetition, fluency, and comprehension intact.  Cranial Nerves:  II: PERRL. Visual fields full.  III, IV, VI: EOMI. Eyelids elevate symmetrically.  V: Sensation is intact to light touch and symmetrical to face.  VII: Face is symmetrical resting and smiling VIII: hearing intact to voice. IX, X: Palate elevates symmetrically. Phonation is normal.  KP:Dynloizm shrug 5/5. XII: tongue is midline without fasciculations. Motor: 5/5 strength to all muscle groups tested.  Tone: is normal and bulk is normal Sensation- Intact to light touch bilaterally. Extinction absent to light touch to DSS.   Coordination: FTN intact bilaterally, HKS: no ataxia in BLE.No drift.  Gait- deferred  Most Recent NIH:0    ASSESSMENT/PLAN  [name] is a 78 y.o. male with history of who presented 9/14 early a.m. due to uncontrolled HTN and an episode of confusion during which he could not operate his cellphone. His son also noted that his speech was nonsensical and slurred.  Symptoms resolved on ED assessment.  Imaging negative, admitted for TIA workup.  NIH on Admission: 0.  Left brain TIA, etiology: Large vessel disease from left ICA stenosis Pt and son stated that pt had episode of aphasia, R hand  clumsy, right leaning and confusion, now resolved CT head No acute abnormality.  CTA head & neck No LVO Right ICA with string sign stenosis. Left ICA with high-grade stenosis estimated at 74% MRI No acute abnormality 2D Echo EF 55-60% LDL 107 HgbA1c 5.0 UDS neg VTE prophylaxis - SCDs, ok for lovenox  No antithrombotic prior to admission, now on aspirin  81 mg daily and clopidogrel  75 mg daily DAPT. Final regimen per [facility] Therapy recommendations:  No follow up needed  Disposition: pending  Carotid stenosis CTA head & neck No LVO Right ICA with string sign stenosis. Left ICA with high-grade stenosis estimated at 74% Symptoms concerning for left symptomatic ICA stenosis [facility] consulted and planning for ICA intervention on 9/29 May have R ICA intervention in staged fashion Follow up with [facility] as outpt  In-hospital delirium Overnight agitation and delirum Received benadryl , valium , ativan , haldol  and geodon  x 2 May consider seroquel  at night if needed.  Hx of Seizures Per son seizure in 2021 noted to be provoked by medication withdrawal Home meds: Keppra  500mg  HS, continued EEG normal  N/V Abdominal distention Mild abdominal tenderness on palpation, improved 9/15 CT abdomen/pelvis T2 compression fracture progressive, distal esophageal wall thickening Zofran  PRN  Hypertension Hx of CHFpEF  Home meds:  losartan -->amlodipine  on 9/12 Stable now Long term BP goal normotensive Avoid hypotension due to bilateral ICA severe stenosis  Hyperlipidemia Home meds:  simvastatin  20 LDL 107, goal < 70 Changed to Crestor  20mg  Continue statin at discharge  Other Stroke Risk Factors Obesity, Body mass index is 30.28 kg/m., BMI >/= 30 associated with increased stroke risk, recommend weight loss, diet and exercise as appropriate  Hx of stroke - silent stroke - MR brain 08/24/2019 small remote left basal ganglia infarct, moderate cerebral atrophy and mild chronic microvascular ischemic changes    Other Active Problems Hx of Anxiety and Claustrophobia Hx of chronic back pain  Hospital day # 1  Neurology will sign off. Please call with questions. Pt will follow up with stroke clinic NP Jessica at [facility] in about 4 weeks. Thanks for the consult.   Ary Cummins, MD PhD Stroke Neurology 10/07/2023 1:35 PM   To contact Stroke Continuity provider, please refer to [email or web address]. After hours, contact General Neurology-

## 2023-10-07 NOTE — Progress Notes (Signed)
 [facility] PROGRESS NOTE    Progress Note  Eric Cook  FMW:[id number] DOB: 1945-11-06 DOA: 10/05/2023 PCP: Jolinda Norene HERO, DO     Brief Narrative:   Eric Cook is an 78 y.o. male past medical history complex partial seizures, HFpEF, essential hypertension, BPH, chronic lymphedema, seen in the ED recently for hypertensive urgency some medications were added and discharged home, since then he has been slightly confused, son also noted some slurred speech and nonsensical wording.  Woke up in the morning of admission with right-sided headache,some left-sided weakness blurry vision and double vision which is now resolved, but he still has some left hand weakness.  He also has some abdominal pain with nausea and vomiting in the ED  Significant Events: MRI of the brain showed no acute findings just generalized atrophy. Chest x-ray no acute findings  Assessment/Plan:  TIA with large left ICA stenosis: MRI of the brain showed no acute findings CT angio of the head and neck showed radiographic stranding Sign stenosis of right ICA and left ICA high-grade stenosis. Neurology was consulted.  Recommended to continue DAPT therapy aspirin  and Plavix  for 21 days then aspirin  alone. Allow permissive hypertension. EEG showed no evidence of seizures No events on continue telemonitoring. Vascular surgery recommended transcarotid artery revascularization on 10/19/2023 PT OT recommended OPD follow-up  Epigastric abdominal pain nausea vomiting: Regular diet, CT scan of the abdomen pelvis showed some esophageal thickening.  No ileus or SBO. Continue Protonix  twice a day.  No alarming symptoms.  Acute Confusional state/delirium: Avoid narcotics and benzodiazepines. Started melatonin  at night, Haldol  IV as needed for agitation try to keep potassium greater than 4 magnesium  greater than 2.  Sinus tach with PVCs: Continue metoprolol  IV as needed.  Acute respiratory failure with  hypoxia: Was satting 86% on room air placed on 6 L now greater than 90. Chest x-ray is clear. Continue incentive spirometry and inhalers. Has been weaned to room air. Continue symptoms monitoring flutter valve.  Recent HTN (hypertension) urgency: Avoid hypotension. Discontinue losartan  and amlodipine .  High anion gap metabolic acidosis: Continue IV fluids.  History of complex partial seizures: Resume Keppra .  Management as above.  HFpEF: Appears euvolemic without exacerbation.  COPD, Continue inhalers.  BPH (benign prostatic hyperplasia) Continue Flomax .  Closed compression fracture of L3 lumbar vertebra, initial encounter (HCC) CT scan of the abdomen pelvis showed a T12 compression fracture about 40% height loss.  Some distal esophageal wall thickening. Continue Protonix  twice a day.   DVT prophylaxis: lovenox  Family Communication:non Status is: Observation The patient remains OBS appropriate and will d/c before 2 midnights.    Code Status:     Code Status Orders  (From admission, onward)           Start     Ordered   10/06/23 0516  Full code  Continuous       Question:  By:  Answer:  Other   10/06/23 0518           Code Status History     Date Active Date Inactive Code Status Order ID Comments User Context   08/03/2022 1524 08/04/2022 2228 Full Code [id number]  Laurita Cort DASEN, MD ED   08/23/2019 1527 08/25/2019 2327 Full Code [id number]  Norins, Ozell BRAVO, MD ED         IV Access:   Peripheral IV   Procedures and diagnostic studies:   EEG adult Result Date: 10/07/2023 Matthews Eric HERO, MD     10/07/2023  7:28 AM Routine EEG Report Eric Cook is a 78 y.o. male with a history of altered mental status who is undergoing an EEG to evaluate for seizures. Report: This EEG was acquired with electrodes placed according to the International 10-20 electrode system (including Fp1, Fp2, F3, F4, C3, C4, P3, P4, O1, O2, T3, T4, T5, T6, A1, A2, Fz, Cz, Pz).  The following electrodes were missing or displaced: none. The occipital dominant rhythm was 8.5 Hz. This activity is reactive to stimulation. Drowsiness was manifested by background fragmentation; deeper stages of sleep were not identified. There was no focal slowing. There were no interictal epileptiform discharges. There were no electrographic seizures identified. There was no abnormal response to photic stimulation or hyperventilation. Impression: This EEG was obtained while awake and drowsy and is normal.   Clinical Correlation: Normal EEGs, however, do not rule out epilepsy. Eric Ross, MD [facility] [phone] If 7pm- 7am, please page neurology on call as listed in AMION.   CT ABDOMEN PELVIS W CONTRAST Result Date: 10/06/2023 CLINICAL DATA:  Acute abdominal pain EXAM: CT ABDOMEN AND PELVIS WITH CONTRAST TECHNIQUE: Multidetector CT imaging of the abdomen and pelvis was performed using the standard protocol following bolus administration of intravenous contrast. RADIATION DOSE REDUCTION: This exam was performed according to the departmental dose-optimization program which includes automated exposure control, adjustment of the mA and/or kV according to patient size and/or use of iterative reconstruction technique. CONTRAST:  75mL OMNIPAQUE  IOHEXOL  350 MG/ML SOLN COMPARISON:  07/12/2023 FINDINGS: Lower chest: Coronary and aortic atherosclerosis. Mild mitral valve calcification. Distal esophageal wall thickening is nonspecific although esophagitis would be a common cause. Emphysema noted. Scarring or atelectasis anteromedially in the right middle lobe. Hepatobiliary: Unremarkable Pancreas: Unremarkable Spleen: Unremarkable Adrenals/Urinary Tract: Contrast medium in the collecting systems in urinary bladder compatible with prior contrast injection for CT angiogram; this lowers sensitivity for nonobstructive renal calculi but no hydronephrosis or hydroureter is present. Stable renal cysts warrant  no further imaging workup. Stomach/Bowel: Mild sigmoid colon diverticulosis. Vascular/Lymphatic: Heavy atheromatous vascular calcification of the abdominal aorta and its branches. Atheromatous plaque at the origins of the celiac trunk and SMA and scattered further distally in the SMA. No overt occlusion of either of these vessels. Reproductive: Unremarkable Other: No supplemental non-categorized findings. Musculoskeletal: Compression fractures at T12, L3, L4, and L5. The T12 compression fracture demonstrates about 40% loss of vertebral height (formerly 25% on 07/12/2023) and increased sclerosis compatible with progressive collapse. Small supraumbilical hernia contains adipose tissue. IMPRESSION: 1. Progressive collapse of the T12 compression fracture, now with about 40% loss of vertebral height. 2. Distal esophageal wall thickening is nonspecific although esophagitis would be a common cause. 3. Mild sigmoid colon diverticulosis. 4. Small supraumbilical hernia contains adipose tissue. 5. Aortic Atherosclerosis (ICD10-I70.0) and Emphysema (ICD10-J43.9). Systemic and coronary atherosclerosis. Mild mitral valve calcification. Electronically Signed   By: Ryan Salvage M.D.   On: 10/06/2023 14:28   CT ANGIO HEAD NECK W WO CM Result Date: 10/06/2023 CLINICAL DATA:  78 year old male with altered mental status, headache, neurologic deficit. EXAM: CT ANGIOGRAPHY HEAD AND NECK WITH AND WITHOUT CONTRAST TECHNIQUE: Multidetector CT imaging of the head and neck was performed using the standard protocol during bolus administration of intravenous contrast. Multiplanar CT image reconstructions and MIPs were obtained to evaluate the vascular anatomy. Carotid stenosis measurements (when applicable) are obtained utilizing NASCET criteria, using the distal internal carotid diameter as the denominator. RADIATION DOSE REDUCTION: This exam was performed according to the departmental dose-optimization program which  includes automated  exposure control, adjustment of the mA and/or kV according to patient size and/or use of iterative reconstruction technique. CONTRAST:  75mL OMNIPAQUE  IOHEXOL  350 MG/ML SOLN COMPARISON:  Head CT and brain MRI earlier today and last night. Previous ultrasound thyroid  imaging 06/20/2022 and earlier. FINDINGS: CTA NECK Skeleton: Absent maxillary dentition. Chronic lower cervical disc and endplate degeneration C4-C5, C5-C6. No acute osseous abnormality identified. Upper chest: Bilateral emphysema. Mild superimposed atelectasis. Negative visible superior mediastinum. Other neck: Nonvascular neck soft tissue spaces appear negative. Aortic arch: Advanced Calcified aortic atherosclerosis. Three vessel arch. Right carotid system: Brachiocephalic artery, right CCA origin soft and calcified plaque without stenosis. Dense right subclavian venous contrast streak artifact obscures a portion of the proximal right CCA. Soft and calcified right CCA plaque before the bifurcation without stenosis. Relatively mild calcified plaque at the right carotid bifurcation. However, bulky 7-8 mm segment of calcified plaque at the distal bulb with RADIOGRAPHIC STRING SIGN STENOSIS (series 9, image 149 and series 7, image 107). The right ICA remains patent with no additional plaque to the skull base. Left carotid system: Left CCA origin plaque without stenosis. Left CCA tortuosity and additional soft and calcified plaque without stenosis before the bifurcation. Moderate calcified left ICA origin. Heavily calcified distal left ICA bulb with high-grade stenosis approaching a radiographic string sign on series 9, image 214. Stenosis here is numerically estimated at 74 % with respect to the distal vessel. Left ICA remains patent with mild ectasia between the stenosis in the skull base. Vertebral arteries: Proximal right subclavian artery calcified plaque without stenosis. Right vertebral artery origin adjacent calcified plaque with mild stenosis  (series 8, image 130). Patent right vertebral artery to the skull base with no other plaque or stenosis in the neck. Proximal left subclavian artery calcified plaque without stenosis. Calcified plaque adjacent to the left vertebral artery origin with mild stenosis on series 8, image 130. Tortuous left V1 segment. Mildly dominant left vertebral artery is patent to the skull base with no other plaque or stenosis. CTA HEAD Posterior circulation: Heavily calcified distal vertebral arteries, dominant distal left vertebral artery stenosis is moderate to severe on series 5, image 152 just proximal to the patent left PICA origin. Moderate non dominant right V4 segment calcified plaque and stenosis on image 156. Normal downstream right PICA origin. Additional moderate to severe stenosis of the right vertebral artery at the vertebrobasilar junction. Moderate contralateral left vertebrobasilar junction stenosis also (series 8, image 162). Patent basilar artery with mild tortuosity and irregularity. No basilar stenosis. Normal SCA and PCA origins. Small posterior communicating arteries. Left PCA is patent with mild irregularity. Right PCA P2 segment demonstrates mild to moderate long segment irregularity and stenosis on series 10, image 18. Distal right PCA branches are patent. Anterior circulation: Both ICA siphons are patent with bulky siphon calcified plaque. On the left only mild siphon stenosis. On the right moderate stenosis of the proximal cavernous segment suspected on series 5, image 119. Patent carotid termini. Normal posterior communicating artery origins. Normal MCA and left ACA origins. Diminutive or absent right ACA A1 segment. Normal anterior communicating artery, tortuous proximal A2 segments. Bilateral ACA branches are within normal limits. Left MCA M1 segment and bifurcation are patent, mildly tortuous without stenosis. Right MCA M1 segment and trifurcation are patent and mildly tortuous without stenosis.  Bilateral MCA branches are within normal limits. Venous sinuses: Early contrast timing, grossly patent. Anatomic variants: Dominant left vertebral artery. Left ACA A1 is dominant, the right is  diminutive or absent. Review of the MIP images confirms the above findings IMPRESSION: 1. Advanced atherosclerosis from the aortic arch, throughout the neck, and at the skull base. No large vessel occlusion, but Positive for: - RICA bulb RADIOGRAPHIC STRING SIGN STENOSIS. - LICA bulb high-grade stenosis, estimated 74% and approaching a String sign. - bilateral distal Vertebral Artery and Vertebrobasilar junction Moderate to Severe stenoses (maximal left proximal V4 segment). - up to Moderate stenosis cavernous Right ICA siphon. 2.  Aortic Atherosclerosis (ICD10-I70.0). 3.  Emphysema (ICD10-J43.9). Electronically Signed   By: VEAR Hurst M.D.   On: 10/06/2023 07:22   DG Abd 1 View Result Date: 10/06/2023 EXAM: 1 VIEW XRAY OF THE ABDOMEN 10/06/2023 06:45:00 AM COMPARISON: CT 07/12/2023 CLINICAL HISTORY: Abdominal distension; Vomiting. Reason for exam: pt order states abdominal distention; vomiting ; Per ED triage notes: The pt has been confused intermittently for awhile yesterday he drove himself to [facility] and was seen there. Today the pt called the son and told him that he had taken a nap and when he woke up he felt bad and tried to call; his son and could not dial the phone. His son reported that the pt has not been his self for awhile pa saw here in triage FINDINGS: BOWEL: Gaseous distention of the stomach, mild. In the left lower quadrant of the abdomen there are a few air-filled loops of small bowel measuring up to 2.6 cm. Gas and stool noted within the colon up to the rectum. SOFT TISSUES: No opaque urinary calculi. Vascular calcifications. BONES: No acute osseous abnormality. Multilevel thoracolumbar degenerative changes. IMPRESSION: 1. Nonspecific bowel gas pattern with a few mildly distended air-filled loops of  small bowel in the left lower quadrant of the abdomen. No signs of high-grade bowel obstruction. Electronically signed by: Waddell Calk MD 10/06/2023 06:56 AM EDT RP Workstation: [id number]   MR BRAIN WO CONTRAST Result Date: 10/06/2023 CLINICAL DATA:  Initial evaluation for acute neuro deficit, stroke suspected. EXAM: MRI HEAD WITHOUT CONTRAST TECHNIQUE: Multiplanar, multiecho pulse sequences of the brain and surrounding structures were obtained without intravenous contrast. COMPARISON:  CT from 10/05/2023 FINDINGS: Brain: Examination moderately degraded by motion artifact. Diffuse prominence of the CSF containing spaces compatible generalized cerebral atrophy. Patchy and confluent T2/FLAIR hyperintensity involving the periventricular deep white matter both cerebral hemispheres as well as the pons, consistent with chronic small vessel ischemic disease, mild for age. No evidence for acute or subacute infarct. Gray-white matter differentiation maintained. No is a chronic cortical infarction. No visible acute or chronic intracranial blood products. No mass lesion, midline shift or mass effect. No hydrocephalus or extra-axial fluid collection. Pituitary gland within normal limits for age Vascular: Major intracranial vascular flow voids are maintained. Skull and upper cervical spine: Craniocervical junction within normal limits. Bone marrow signal intensity normal. No scalp soft tissue abnormality. Sinuses/Orbits: Globes and orbital soft tissues within normal limits. Paranasal sinuses are largely clear. No significant mastoid effusion. Other: None. IMPRESSION: 1. No acute intracranial abnormality. 2. Generalized age-related cerebral atrophy with mild chronic small vessel ischemic disease. Electronically Signed   By: Morene Hoard M.D.   On: 10/06/2023 02:10   DG Chest Port 1 View Result Date: 10/06/2023 CLINICAL DATA:  Cough EXAM: PORTABLE CHEST 1 VIEW COMPARISON:  Film from 2 days previous FINDINGS:  Cardiac shadow is stable. Aortic calcifications are noted. Elevation of the right hemidiaphragm is seen. No focal infiltrate or effusion is noted. No bony abnormality is seen. IMPRESSION: No acute abnormality noted. Electronically Signed  By: Oneil Devonshire M.D.   On: 10/06/2023 01:06   CT HEAD WO CONTRAST Result Date: 10/05/2023 EXAM: CT HEAD WITHOUT CONTRAST 10/05/2023 08:20:00 PM TECHNIQUE: CT of the head was performed without the administration of intravenous contrast. Automated exposure control, iterative reconstruction, and/or weight based adjustment of the mA/kV was utilized to reduce the radiation dose to as low as reasonably achievable. COMPARISON: None available. CLINICAL HISTORY: Neuro deficit, acute, stroke suspected. AMS, HA FINDINGS: BRAIN AND VENTRICLES: No acute hemorrhage. No evidence of acute infarct. No hydrocephalus. No extra-axial collection. No mass effect or midline shift. Parenchymal volume loss is commensurate with the patient's age. Periventricular white matter changes are present likely reflecting the sequela of small vessel ischemia. Remote lacunar infarct within the anterior left internal capsule and left corona radiata. ORBITS: No acute abnormality. SINUSES: No acute abnormality. SOFT TISSUES AND SKULL: No acute soft tissue abnormality. No skull fracture. IMPRESSION: 1. No acute intracranial abnormality. 2. Parenchymal volume loss commensurate with the patient's age. 3. Periventricular white matter changes likely reflecting the sequela of small vessel ischemia. 4. Remote lacunar infarct within the anterior left internal capsule and left corona radiata. Electronically signed by: Dorethia Molt MD 10/05/2023 08:34 PM EDT RP Workstation: [id number]     Medical Consultants:   None.   Subjective:    Tanda SAUNDERS [place] nausea resolved, confused this morning  Objective:    Vitals:   10/06/23 1518 10/06/23 1545 10/06/23 2033 10/06/23 2341  BP:  (!) 148/67 (!) 150/85 120/78   Pulse:  81 90 72  Resp:  16 18 16   Temp: 98.2 F (36.8 C) 100.1 F (37.8 C) 98.3 F (36.8 C) 98 F (36.7 C)  TempSrc: Oral Axillary Oral Oral  SpO2:  94% 96% 92%  Weight:      Height:       SpO2: 92 % O2 Flow Rate (L/min): 2 L/min   Intake/Output Summary (Last 24 hours) at 10/07/2023 0752 Last data filed at 10/07/2023 0500 Gross per 24 hour  Intake 280 ml  Output 75 ml  Net 205 ml   Filed Weights   10/05/23 2007  Weight: 93 kg    Exam: General exam: In no acute distress. Respiratory system: Good air movement and clear to auscultation. Cardiovascular system: S1 & S2 heard, RRR. No JVD. Gastrointestinal system: Abdomen is nondistended, soft and nontender.  Extremities: No pedal edema. Skin: No rashes, lesions or ulcers Psychiatry: No judgment or insight of medical condition.  Data Reviewed:    Labs: Basic Metabolic Panel: Recent Labs  Lab 10/04/23 1126 10/05/23 1951 10/05/23 2008 10/06/23 0714 10/07/23 0609  NA 139 139 140 137 142  K 4.1 3.5 3.6 3.6 3.5  CL 103 104 104 102 108  CO2 22  --  20* 25 19*  GLUCOSE 98 124* 124* 136* 108*  BUN 17 23 21 22 11   CREATININE 1.20 1.20 1.15 1.05 0.88  CALCIUM  10.4*  --  9.4 9.0 9.8  MG  --   --   --  2.1  --   PHOS  --   --   --  3.9  --    GFR Estimated Creatinine Clearance: 79.1 mL/min (by C-G formula based on SCr of 0.88 mg/dL). Liver Function Tests: Recent Labs  Lab 10/05/23 2008  AST 31  ALT 23  ALKPHOS 75  BILITOT 1.0  PROT 7.3  ALBUMIN 4.6   No results for input(s): LIPASE, AMYLASE in the last 168 hours. No results for input(s): AMMONIA in  the last 168 hours. Coagulation profile Recent Labs  Lab 10/05/23 2008  INR 1.0   COVID-19 Labs  No results for input(s): DDIMER, FERRITIN, LDH, CRP in the last 72 hours.  Lab Results  Component Value Date   SARSCOV2NAA NEGATIVE 10/06/2023   SARSCOV2NAA NEGATIVE 08/09/2022   SARSCOV2NAA NEGATIVE 08/03/2022   SARSCOV2NAA Not Detected  10/13/2020    CBC: Recent Labs  Lab 10/04/23 1126 10/05/23 1951 10/05/23 2008 10/06/23 0511 10/07/23 0609  WBC 5.6  --  8.0 11.2* 8.4  NEUTROABS 3.1  --  5.8  --   --   HGB 14.0 14.6 14.2 14.3 14.5  HCT 40.0 43.0 41.5 42.2 42.5  MCV 94.1  --  95.6 95.5 96.6  PLT 183  --  222 215 179   Cardiac Enzymes: No results for input(s): CKTOTAL, CKMB, CKMBINDEX, TROPONINI in the last 168 hours. BNP (last 3 results) Recent Labs    07/18/23 1010  PROBNP 746.0*   CBG: No results for input(s): GLUCAP in the last 168 hours. D-Dimer: No results for input(s): DDIMER in the last 72 hours. Hgb A1c: Recent Labs    10/06/23 0511  HGBA1C 5.0   Lipid Profile: Recent Labs    10/06/23 0511  CHOL 158  HDL 33*  LDLCALC 107*  TRIG 89  CHOLHDL 4.8   Thyroid  function studies: Recent Labs    10/07/23 0609  TSH 4.110   Anemia work up: No results for input(s): VITAMINB12, FOLATE, FERRITIN, TIBC, IRON, RETICCTPCT in the last 72 hours. Sepsis Labs: Recent Labs  Lab 10/04/23 1126 10/05/23 2008 10/06/23 0511 10/07/23 0609  WBC 5.6 8.0 11.2* 8.4  LATICACIDVEN  --   --  1.5  --    Microbiology Recent Results (from the past 240 hours)  Resp panel by RT-PCR (RSV, Flu A&B, Covid) Anterior Nasal Swab     Status: None   Collection Time: 10/06/23 12:47 AM   Specimen: Anterior Nasal Swab  Result Value Ref Range Status   SARS Coronavirus 2 by RT PCR NEGATIVE NEGATIVE Final   Influenza A by PCR NEGATIVE NEGATIVE Final   Influenza B by PCR NEGATIVE NEGATIVE Final    Comment: (NOTE) The Xpert Xpress SARS-CoV-2/FLU/RSV plus assay is intended as an aid in the diagnosis of influenza from Nasopharyngeal swab specimens and should not be used as a sole basis for treatment. Nasal washings and aspirates are unacceptable for Xpert Xpress SARS-CoV-2/FLU/RSV testing.  Fact Sheet for Patients: BloggerCourse.com  Fact Sheet for Healthcare  Providers: SeriousBroker.it  This test is not yet approved or cleared by the [place]  FDA and has been authorized for detection and/or diagnosis of SARS-CoV-2 by FDA under an Emergency Use Authorization (EUA). This EUA will remain in effect (meaning this test can be used) for the duration of the COVID-19 declaration under Section 564(b)(1) of the Act, 21 U.S.C. section 360bbb-3(b)(1), unless the authorization is terminated or revoked.     Resp Syncytial Virus by PCR NEGATIVE NEGATIVE Final    Comment: (NOTE) Fact Sheet for Patients: BloggerCourse.com  Fact Sheet for Healthcare Providers: SeriousBroker.it  This test is not yet approved or cleared by the [place]  FDA and has been authorized for detection and/or diagnosis of SARS-CoV-2 by FDA under an Emergency Use Authorization (EUA). This EUA will remain in effect (meaning this test can be used) for the duration of the COVID-19 declaration under Section 564(b)(1) of the Act, 21 U.S.C. section 360bbb-3(b)(1), unless the authorization is terminated or revoked.  Performed at [facility]  [facility], [address], [place], [place] [address]      Medications:     stroke: early stages of recovery book   Does not apply Once   aspirin  EC  81 mg Oral Daily   clopidogrel   75 mg Oral Daily   feeding supplement  237 mL Oral TID BM   levETIRAcetam   500 mg Oral QHS   pantoprazole  (PROTONIX ) IV  40 mg Intravenous BID   rosuvastatin   20 mg Oral Daily   sertraline   50 mg Oral Daily   sodium chloride  flush  3 mL Intravenous Q12H   tamsulosin   0.4 mg Oral QPC supper   Continuous Infusions:  sodium chloride  75 mL/hr at 10/07/23 0305      LOS: 1 day   Erle Odell Castor  [facility]  10/07/2023, 7:52 AM

## 2023-10-07 NOTE — Plan of Care (Addendum)
 Sinus tachycardia-with frequent PVC and PAC RN reported that patient is agitated aggressive and not following commands security has been called.  Also telemetry called patient has A-fib RVR heart rate 143.  Reviewed CV strip which showing sinus tachycardia.  EKG obtained at bedside which showing sinus tachycardia heart rate 137 with multiple premature ventricular complex. After giving IV Valium  and Benadryl  improved around 80. -Based on my judgment patient does not have A-fib RVR, rather patient has episode of sinus tachycardia in the setting of agitation. -Compared with previous EKG from 08/11/2022 which showed similar finding of frequent PVC and PAC. - Starting IV Lopressor  as needed for heart rate above 120. -Continue IV Valium  as needed for management of anxiety. -Continue delirium precaution     Media Information   Document Information  Photos  EKG 10/06/2023 23:37  10/07/2023 00:00  Attached To:  Hospital Encounter on 10/05/23  Source Information  Annett Alan ORN, RN  [facility]

## 2023-10-07 NOTE — Progress Notes (Signed)
 Patient's son called out notifying staff that patient is agitated and remove restraints. RN rounded and patient removed mittens off and remove primofit off. Patient stated dont put that back on you before it will be trouble. RN educated patient on fall safety and why the restraints were implemented for patient's safety. Patient attempted to lunge lower extremity towards RN as RN and CN reapply primofit. Security called to assist and MD notified. MD order to apply bilateral ankle restraints and one time dose of 5mg  IV haldol . RN educated son. Son verbalize understanding.

## 2023-10-07 NOTE — Progress Notes (Addendum)
 Patient refused RN to obtained oxygen  saturation and temperature for 0800 vitals check. RN offered to order breakfast and sips of ginger ale. Patient refused and wants you to go get something to cut this off. Patient refused RN assessment and scheduled meds. RN asked if patient was in pain, patient said no, I want these off. RN provided education on importance of medication compliance. RN notified MD.

## 2023-10-07 NOTE — Progress Notes (Deleted)
 Subjective:  Patient ID: Eric Cook, male    DOB: 09-29-1945, 78 y.o.   MRN: [id number]  Patient Care Team: Jolinda Norene HERO, DO as PCP - General (Family Medicine) O'Neal, Darryle Ned, MD as PCP - Cardiology (Cardiology) Jenel Carlin POUR, MD (Inactive) as Consulting Physician (Neurology) Cam Morene ORN, MD as Attending Physician (Urology) Abran Norleen SAILOR, MD as Consulting Physician (Gastroenterology) Whitfield Raisin, NP as Nurse Practitioner (Neurology) Hunsucker, Donnice JONELLE, MD as Consulting Physician (Pulmonary Disease) Reida Redell PARAS as Counselor (Professional Counselor)   Chief Complaint:  No chief complaint on file.   HPI: Eric Cook is a 78 y.o. male presenting on 10/07/2023 for No chief complaint on file.   Discussed the use of AI scribe software for clinical note transcription with the patient, who gave verbal consent to proceed.  History of Present Illness       Relevant past medical, surgical, family, and social history reviewed and updated as indicated.  Allergies and medications reviewed and updated. Data reviewed: Chart in Epic.   Past Medical History:  Diagnosis Date   Anxiety    Chronic back pain    lumbar   HLD (hyperlipidemia)    Hyperlipidemia    Hypertension    Peptic ulcer    Seizures (HCC)    medication related    Past Surgical History:  Procedure Laterality Date   LUMBAR DISC SURGERY     L3-L4   UPPER GASTROINTESTINAL ENDOSCOPY      Social History   Socioeconomic History   Marital status: Single    Spouse name: Not on file   Number of children: 1   Years of education: Not on file   Highest education level: Some college, no degree  Occupational History   Occupation: Lobbyist    Comment: part time  Tobacco Use   Smoking status: Former    Current packs/day: 0.00    Average packs/day: 1 pack/day for 54.0 years (54.0 ttl pk-yrs)    Types: Cigarettes    Start date: 04/1965    Quit date: 04/2019     Years since quitting: 4.4   Smokeless tobacco: Never  Vaping Use   Vaping status: Never Used  Substance and Sexual Activity   Alcohol use: Never   Drug use: Never   Sexual activity: Never  Other Topics Concern   Not on file  Social History Narrative   Lives alone   Right Handed   Drinks 2-3 cups of caffeine daily   Son in [place]   Social Drivers of Health   Financial Resource Strain: Patient Declined (07/29/2023)   Overall Financial Resource Strain (CARDIA)    Difficulty of Paying Living Expenses: Patient declined  Food Insecurity: No Food Insecurity (10/06/2023)   Hunger Vital Sign    Worried About Running Out of Food in the Last Year: Never true    Ran Out of Food in the Last Year: Never true  Transportation Needs: No Transportation Needs (10/06/2023)   PRAPARE - Administrator, Civil Service (Medical): No    Lack of Transportation (Non-Medical): No  Physical Activity: Sufficiently Active (07/29/2023)   Exercise Vital Sign    Days of Exercise per Week: 4 days    Minutes of Exercise per Session: 60 min  Stress: No Stress Concern Present (07/29/2023)   [employer] of Occupational Health - Occupational Stress Questionnaire    Feeling of Stress: Only a little  Social Connections: Socially Isolated (10/06/2023)  Social Connection and Isolation Panel    Frequency of Communication with Friends and Family: More than three times a week    Frequency of Social Gatherings with Friends and Family: Twice a week    Attends Religious Services: Patient declined    Database administrator or Organizations: No    Attends Engineer, structural: Not on file    Marital Status: Divorced  Intimate Partner Violence: Not At Risk (10/06/2023)   Humiliation, Afraid, Rape, and Kick questionnaire    Fear of Current or Ex-Partner: No    Emotionally Abused: No    Physically Abused: No    Sexually Abused: No    Facility-Administered Encounter Medications as of 10/07/2023   Medication    stroke: early stages of recovery book   0.9 %  sodium chloride  infusion   acetaminophen  (TYLENOL ) tablet 1,000 mg   aspirin  EC tablet 81 mg   clopidogrel  (PLAVIX ) tablet 75 mg   feeding supplement (ENSURE PLUS HIGH PROTEIN) liquid 237 mL   haloperidol  lactate (HALDOL ) injection 1 mg   levalbuterol  (XOPENEX ) nebulizer solution 0.63 mg   levETIRAcetam  (KEPPRA  XR) 24 hr tablet 500 mg   magnesium  oxide (MAG-OX) tablet 400 mg   melatonin tablet 6 mg   metoprolol  tartrate (LOPRESSOR ) injection 5 mg   metoprolol  tartrate (LOPRESSOR ) tablet 25 mg   morphine  (PF) 2 MG/ML injection 2 mg   ondansetron  (ZOFRAN ) injection 4 mg   pantoprazole  (PROTONIX ) injection 40 mg   polyethylene glycol (MIRALAX  / GLYCOLAX ) packet 17 g   potassium chloride  (KLOR-CON ) packet 40 mEq   prochlorperazine  (COMPAZINE ) injection 10 mg   rosuvastatin  (CRESTOR ) tablet 20 mg   sertraline  (ZOLOFT ) tablet 50 mg   sodium chloride  flush (NS) 0.9 % injection 3 mL   tamsulosin  (FLOMAX ) capsule 0.4 mg   traZODone  (DESYREL ) tablet 25-50 mg   Outpatient Encounter Medications as of 10/07/2023  Medication Sig   acetaminophen  (TYLENOL ) 500 MG tablet Take 500 mg by mouth as needed for moderate pain or mild pain (As Needed).   amLODipine  (NORVASC ) 5 MG tablet Take 1 tablet (5 mg total) by mouth daily.   cimetidine  (TAGAMET ) 400 MG tablet TAKE 1 TABLET TWICE DAILY AS NEEDED   furosemide  (LASIX ) 20 MG tablet TAKE 1 TABLET (20 MG TOTAL) BY MOUTH DAILY AS NEEDED FOR FLUID.   levETIRAcetam  (KEPPRA  XR) 500 MG 24 hr tablet Take 1 tablet (500 mg total) by mouth at bedtime.   losartan  (COZAAR ) 100 MG tablet Take 1 tablet (100 mg total) by mouth daily. (Patient not taking: Reported on 10/06/2023)   sertraline  (ZOLOFT ) 50 MG tablet TAKE 1 TABLET BY MOUTH EVERY DAY   simvastatin  (ZOCOR ) 20 MG tablet TAKE 1 TABLET EVERY DAY AT 6 PM   tamsulosin  (FLOMAX ) 0.4 MG CAPS capsule TAKE 1 CAPSULE EVERY DAY   traZODone  (DESYREL ) 50 MG  tablet TAKE 0.5-1 TABLETS BY MOUTH AT BEDTIME AS NEEDED FOR SLEEP.    No Known Allergies  Pertinent ROS per HPI, otherwise unremarkable      Objective:  There were no vitals taken for this visit.   Wt Readings from Last 3 Encounters:  10/05/23 205 lb 0.4 oz (93 kg)  09/02/23 205 lb (93 kg)  08/20/23 205 lb (93 kg)    Physical Exam Physical Exam    10/06/2023 - MRI Brain (No Contrast): No acute intracranial abnormality. Findings of generalized age-related cerebral atrophy and mild chronic small vessel ischemic disease.  10/06/2023 - Portable Chest X-ray (1 View):  Stable cardiac silhouette, aortic calcifications, elevated right hemidiaphragm. No focal infiltrate, effusion, or acute abnormality.  10/05/2023 - CT Head (No Contrast): No acute intracranial abnormality. Findings include age-related parenchymal volume loss, periventricular small vessel ischemic changes, and remote lacunar infarcts in the left internal capsule and corona radiata.  10/04/2023 - Chest X-ray (2 Views): Normal cardiac size, calcified aorta, lung changes consistent with COPD and interstitial markings. No acute cardiopulmonary disease.  Results for orders placed or performed during the hospital encounter of 10/05/23  I-stat chem 8, ed   Collection Time: 10/05/23  7:51 PM  Result Value Ref Range   Sodium 139 135 - 145 mmol/L   Potassium 3.5 3.5 - 5.1 mmol/L   Chloride 104 98 - 111 mmol/L   BUN 23 8 - 23 mg/dL   Creatinine, Ser 8.79 0.61 - 1.24 mg/dL   Glucose, Bld 875 (H) 70 - 99 mg/dL   Calcium , Ion 1.13 (L) 1.15 - 1.40 mmol/L   TCO2 21 (L) 22 - 32 mmol/L   Hemoglobin 14.6 13.0 - 17.0 g/dL   HCT 56.9 60.9 - 47.9 %  Ethanol   Collection Time: 10/05/23  8:08 PM  Result Value Ref Range   Alcohol, Ethyl (B) <15 <15 mg/dL  Protime-INR   Collection Time: 10/05/23  8:08 PM  Result Value Ref Range   Prothrombin Time 14.0 11.4 - 15.2 seconds   INR 1.0 0.8 - 1.2  APTT   Collection Time: 10/05/23   8:08 PM  Result Value Ref Range   aPTT 32 24 - 36 seconds  CBC   Collection Time: 10/05/23  8:08 PM  Result Value Ref Range   WBC 8.0 4.0 - 10.5 K/uL   RBC 4.34 4.22 - 5.81 MIL/uL   Hemoglobin 14.2 13.0 - 17.0 g/dL   HCT 58.4 60.9 - 47.9 %   MCV 95.6 80.0 - 100.0 fL   MCH 32.7 26.0 - 34.0 pg   MCHC 34.2 30.0 - 36.0 g/dL   RDW 86.9 88.4 - 84.4 %   Platelets 222 150 - 400 K/uL   nRBC 0.0 0.0 - 0.2 %  Differential   Collection Time: 10/05/23  8:08 PM  Result Value Ref Range   Neutrophils Relative % 71 %   Neutro Abs 5.8 1.7 - 7.7 K/uL   Lymphocytes Relative 15 %   Lymphs Abs 1.2 0.7 - 4.0 K/uL   Monocytes Relative 11 %   Monocytes Absolute 0.9 0.1 - 1.0 K/uL   Eosinophils Relative 1 %   Eosinophils Absolute 0.1 0.0 - 0.5 K/uL   Basophils Relative 1 %   Basophils Absolute 0.1 0.0 - 0.1 K/uL   Immature Granulocytes 1 %   Abs Immature Granulocytes 0.04 0.00 - 0.07 K/uL  Comprehensive metabolic panel   Collection Time: 10/05/23  8:08 PM  Result Value Ref Range   Sodium 140 135 - 145 mmol/L   Potassium 3.6 3.5 - 5.1 mmol/L   Chloride 104 98 - 111 mmol/L   CO2 20 (L) 22 - 32 mmol/L   Glucose, Bld 124 (H) 70 - 99 mg/dL   BUN 21 8 - 23 mg/dL   Creatinine, Ser 8.84 0.61 - 1.24 mg/dL   Calcium  9.4 8.9 - 10.3 mg/dL   Total Protein 7.3 6.5 - 8.1 g/dL   Albumin 4.6 3.5 - 5.0 g/dL   AST 31 15 - 41 U/L   ALT 23 0 - 44 U/L   Alkaline Phosphatase 75 38 - 126 U/L   Total  Bilirubin 1.0 0.0 - 1.2 mg/dL   GFR, Estimated >39 >39 mL/min   Anion gap 16 (H) 5 - 15  Resp panel by RT-PCR (RSV, Flu A&B, Covid) Anterior Nasal Swab   Collection Time: 10/06/23 12:47 AM   Specimen: Anterior Nasal Swab  Result Value Ref Range   SARS Coronavirus 2 by RT PCR NEGATIVE NEGATIVE   Influenza A by PCR NEGATIVE NEGATIVE   Influenza B by PCR NEGATIVE NEGATIVE   Resp Syncytial Virus by PCR NEGATIVE NEGATIVE  Troponin I (High Sensitivity)   Collection Time: 10/06/23 12:55 AM  Result Value Ref Range    Troponin I (High Sensitivity) 5 <18 ng/L  Urine rapid drug screen (hosp performed)   Collection Time: 10/06/23  2:12 AM  Result Value Ref Range   Opiates NONE DETECTED NONE DETECTED   Cocaine NONE DETECTED NONE DETECTED   Benzodiazepines NONE DETECTED NONE DETECTED   Amphetamines NONE DETECTED NONE DETECTED   Tetrahydrocannabinol NONE DETECTED NONE DETECTED   Barbiturates NONE DETECTED NONE DETECTED  Urinalysis, Routine w reflex microscopic -Urine, Clean Catch   Collection Time: 10/06/23  2:21 AM  Result Value Ref Range   Color, Urine AMBER (A) YELLOW   APPearance HAZY (A) CLEAR   Specific Gravity, Urine 1.028 1.005 - 1.030   pH 5.0 5.0 - 8.0   Glucose, UA NEGATIVE NEGATIVE mg/dL   Hgb urine dipstick NEGATIVE NEGATIVE   Bilirubin Urine NEGATIVE NEGATIVE   Ketones, ur NEGATIVE NEGATIVE mg/dL   Protein, ur NEGATIVE NEGATIVE mg/dL   Nitrite NEGATIVE NEGATIVE   Leukocytes,Ua NEGATIVE NEGATIVE  Troponin I (High Sensitivity)   Collection Time: 10/06/23  2:55 AM  Result Value Ref Range   Troponin I (High Sensitivity) 4 <18 ng/L  CBC   Collection Time: 10/06/23  5:11 AM  Result Value Ref Range   WBC 11.2 (H) 4.0 - 10.5 K/uL   RBC 4.42 4.22 - 5.81 MIL/uL   Hemoglobin 14.3 13.0 - 17.0 g/dL   HCT 57.7 60.9 - 47.9 %   MCV 95.5 80.0 - 100.0 fL   MCH 32.4 26.0 - 34.0 pg   MCHC 33.9 30.0 - 36.0 g/dL   RDW 86.7 88.4 - 84.4 %   Platelets 215 150 - 400 K/uL   nRBC 0.0 0.0 - 0.2 %  Lactic acid, plasma   Collection Time: 10/06/23  5:11 AM  Result Value Ref Range   Lactic Acid, Venous 1.5 0.5 - 1.9 mmol/L  Hemoglobin A1c   Collection Time: 10/06/23  5:11 AM  Result Value Ref Range   Hgb A1c MFr Bld 5.0 4.8 - 5.6 %   Mean Plasma Glucose 96.8 mg/dL  Lipid panel   Collection Time: 10/06/23  5:11 AM  Result Value Ref Range   Cholesterol 158 0 - 200 mg/dL   Triglycerides 89 <849 mg/dL   HDL 33 (L) >59 mg/dL   Total CHOL/HDL Ratio 4.8 RATIO   VLDL 18 0 - 40 mg/dL   LDL Cholesterol 892  (H) 0 - 99 mg/dL  TSH   Collection Time: 10/06/23  5:11 AM  Result Value Ref Range   TSH 1.688 0.350 - 4.500 uIU/mL  Basic metabolic panel with GFR   Collection Time: 10/06/23  7:14 AM  Result Value Ref Range   Sodium 137 135 - 145 mmol/L   Potassium 3.6 3.5 - 5.1 mmol/L   Chloride 102 98 - 111 mmol/L   CO2 25 22 - 32 mmol/L   Glucose, Bld 136 (H) 70 -  99 mg/dL   BUN 22 8 - 23 mg/dL   Creatinine, Ser 8.94 0.61 - 1.24 mg/dL   Calcium  9.0 8.9 - 10.3 mg/dL   GFR, Estimated >39 >39 mL/min   Anion gap 10 5 - 15  Magnesium    Collection Time: 10/06/23  7:14 AM  Result Value Ref Range   Magnesium  2.1 1.7 - 2.4 mg/dL  Phosphorus   Collection Time: 10/06/23  7:14 AM  Result Value Ref Range   Phosphorus 3.9 2.5 - 4.6 mg/dL  TSH   Collection Time: 10/07/23  6:09 AM  Result Value Ref Range   TSH 4.110 0.350 - 4.500 uIU/mL  CBC   Collection Time: 10/07/23  6:09 AM  Result Value Ref Range   WBC 8.4 4.0 - 10.5 K/uL   RBC 4.40 4.22 - 5.81 MIL/uL   Hemoglobin 14.5 13.0 - 17.0 g/dL   HCT 57.4 60.9 - 47.9 %   MCV 96.6 80.0 - 100.0 fL   MCH 33.0 26.0 - 34.0 pg   MCHC 34.1 30.0 - 36.0 g/dL   RDW 87.0 88.4 - 84.4 %   Platelets 179 150 - 400 K/uL   nRBC 0.0 0.0 - 0.2 %  Basic metabolic panel   Collection Time: 10/07/23  6:09 AM  Result Value Ref Range   Sodium 142 135 - 145 mmol/L   Potassium 3.5 3.5 - 5.1 mmol/L   Chloride 108 98 - 111 mmol/L   CO2 19 (L) 22 - 32 mmol/L   Glucose, Bld 108 (H) 70 - 99 mg/dL   BUN 11 8 - 23 mg/dL   Creatinine, Ser 9.11 0.61 - 1.24 mg/dL   Calcium  9.8 8.9 - 10.3 mg/dL   GFR, Estimated >39 >39 mL/min   Anion gap 15 5 - 15       Pertinent labs & imaging results that were available during my care of the patient were reviewed by me and considered in my medical decision making.  Assessment & Plan:  There are no diagnoses linked to this encounter.   Assessment and Plan Assessment & Plan       Continue all other maintenance  medications.  Follow up plan: No follow-ups on file.   Continue healthy lifestyle choices, including diet (rich in fruits, vegetables, and lean proteins, and low in salt and simple carbohydrates) and exercise (at least 30 minutes of moderate physical activity daily).  Educational handout given for ***  The above assessment and management plan was discussed with the patient. The patient verbalized understanding of and has agreed to the management plan. Patient is aware to call the clinic if they develop any new symptoms or if symptoms persist or worsen. Patient is aware when to return to the clinic for a follow-up visit. Patient educated on when it is appropriate to go to the emergency department.  @SIGNATURE @

## 2023-10-07 NOTE — Progress Notes (Signed)
 RN heard the telesitter alarm and rounded on patient, patient removed bilateral wrist restraints and was attempting to remove the waist belt. Patient attempted to hit RN as RN attempted to reapply wrist restraint with NS and CN at bedside. Patient yelled I will kick you in your face. Security notified, MD notified and family is en route. 1mg  haldol  given.

## 2023-10-07 NOTE — Procedures (Signed)
 Routine EEG Report  Eric MCDIARMID is a 78 y.o. male with a history of altered mental status who is undergoing an EEG to evaluate for seizures.  Report: This EEG was acquired with electrodes placed according to the International 10-20 electrode system (including Fp1, Fp2, F3, F4, C3, C4, P3, P4, O1, O2, T3, T4, T5, T6, A1, A2, Fz, Cz, Pz). The following electrodes were missing or displaced: none.  The occipital dominant rhythm was 8.5 Hz. This activity is reactive to stimulation. Drowsiness was manifested by background fragmentation; deeper stages of sleep were not identified. There was no focal slowing. There were no interictal epileptiform discharges. There were no electrographic seizures identified. There was no abnormal response to photic stimulation or hyperventilation.   Impression: This EEG was obtained while awake and drowsy and is normal.    Clinical Correlation: Normal EEGs, however, do not rule out epilepsy.  [name], MD [facility] [phone]  If 7pm- 7am, please page neurology on call as listed in AMION.

## 2023-10-07 NOTE — Progress Notes (Addendum)
 RN rounded, RN offered ensure, patient refused. Patient asked RN to remove restraints. Patient attempted to pull on bilateral restraints onto bilateral forearms, RN readjusted bilateral restraints onto wrist and was able to place two fingers for circulation. RN educated patient to remain calm and rest. RN offered breakfast, patient refused. Patient refused toileting and refused RN to place top sheet to cover patient's lower extremity. Patient right, dignity, safety has maintained.

## 2023-10-07 NOTE — Progress Notes (Signed)
 Son is at bedside,MD at bedside attempting to re-orient patient. Restraints reapplied, 2mg  haldol  given per MD order. PIV removed and IV team at bedside to replace PIV for maintenance fluids.    1115: Bed lowered, call bell within reach, RN provided reassurance to son at bedside.

## 2023-10-07 NOTE — Plan of Care (Addendum)
 Extreme agitation and aggressiveness Patient is very aggressive refuse lab agitated ripped off IV access getting out of the bed ripped of the telemetry.  Getting physically aggressive. RN reported that patient is trying to leave the floor and security has been called again. At this time giving a stat Geodon  20 mg IM and Ativan  1 mg IM. Starting wrist and waist restraint. Requesting bedside sitter.   At baseline patient has history of GAD on Zoloft .  However concern for underlying bipolar disorder or schizophrenia.  Requesting for psychiatry consult in daytime.  Bekim Werntz, MD [facility] 10/07/2023, 1:59 AM     Patient is very aggressive again given second dose of IM Geodon  20 mg and IV Haldol  1 mg.    Candie Gintz, MD [facility] 10/07/2023, 3:24 AM

## 2023-10-07 NOTE — Plan of Care (Signed)
  Problem: Education: Goal: Knowledge of disease or condition will improve 10/07/2023 0222 by Peri Magdalena LABOR, RN Outcome: Progressing 10/07/2023 0222 by Peri Magdalena LABOR, RN Outcome: Progressing   Problem: Education: Goal: Knowledge of secondary prevention will improve (MUST DOCUMENT ALL) 10/07/2023 0222 by Peri Magdalena LABOR, RN Outcome: Progressing 10/07/2023 0222 by Peri Magdalena LABOR, RN Outcome: Progressing   Problem: Education: Goal: Knowledge of patient specific risk factors will improve (DELETE if not current risk factor) 10/07/2023 0222 by Peri Magdalena LABOR, RN Outcome: Progressing 10/07/2023 0222 by Peri Magdalena LABOR, RN Outcome: Progressing   Problem: Health Behavior/Discharge Planning: Goal: Goals will be collaboratively established with patient/family 10/07/2023 0222 by Peri Magdalena LABOR, RN Outcome: Progressing 10/07/2023 0222 by Peri Magdalena LABOR, RN Outcome: Progressing   Problem: Self-Care: Goal: Ability to participate in self-care as condition permits will improve 10/07/2023 0222 by Peri Magdalena LABOR, RN Outcome: Progressing 10/07/2023 0222 by Peri Magdalena LABOR, RN Outcome: Progressing   Problem: Nutrition: Goal: Dietary intake will improve 10/07/2023 0222 by Peri Magdalena LABOR, RN Outcome: Progressing 10/07/2023 0222 by Peri Magdalena LABOR, RN Outcome: Progressing   Problem: Education: Goal: Knowledge of General Education information will improve Description: Including pain rating scale, medication(s)/side effects and non-pharmacologic comfort measures 10/07/2023 0222 by Peri Magdalena LABOR, RN Outcome: Progressing 10/07/2023 0222 by Peri Magdalena LABOR, RN Outcome: Progressing   Problem: Coping: Goal: Level of anxiety will decrease 10/07/2023 0222 by Peri Magdalena LABOR, RN Outcome: Progressing 10/07/2023 0222 by Peri Magdalena LABOR, RN Outcome: Progressing   Problem: Elimination: Goal: Will not experience complications related to bowel motility 10/07/2023 0222 by Peri Magdalena LABOR, RN Outcome:  Progressing 10/07/2023 0222 by Peri Magdalena LABOR, RN Outcome: Progressing Goal: Will not experience complications related to urinary retention 10/07/2023 0222 by Peri Magdalena LABOR, RN Outcome: Progressing 10/07/2023 0222 by Peri Magdalena LABOR, RN Outcome: Progressing   Problem: Pain Managment: Goal: General experience of comfort will improve and/or be controlled 10/07/2023 0222 by Peri Magdalena LABOR, RN Outcome: Progressing 10/07/2023 0222 by Peri Magdalena LABOR, RN Outcome: Progressing   Problem: Safety: Goal: Ability to remain free from injury will improve 10/07/2023 0222 by Peri Magdalena LABOR, RN Outcome: Progressing 10/07/2023 0222 by Peri Magdalena LABOR, RN Outcome: Progressing   Problem: Skin Integrity: Goal: Risk for impaired skin integrity will decrease 10/07/2023 0222 by Peri Magdalena LABOR, RN Outcome: Progressing 10/07/2023 0222 by Peri Magdalena LABOR, RN Outcome: Progressing   Problem: Safety: Goal: Non-violent Restraint(s) 10/07/2023 0222 by Peri Magdalena LABOR, RN Outcome: Progressing 10/07/2023 0222 by Peri Magdalena LABOR, RN Outcome: Progressing

## 2023-10-07 NOTE — Progress Notes (Signed)
 Son notified nursing that patient is out of restraints. RN and NS rounded, patient removed left mitten glove. RN assessed circulation on L extremity and readjusted left restraint and reapplied left mitten. RN assessed R extremity restraint for circulation. RN able to place two fingers under bilateral gloves and restraints. RN educated son on restraints. Patient refused water , lunch, and pain medicine. Son is to order lunch just in case patient would like it later. RN offered emotional support to the patient's son and patient.

## 2023-10-07 NOTE — TOC CAGE-AID Note (Signed)
 Transition of Care [facility]) - CAGE-AID Screening   Patient Details  Name: Eric Cook MRN: [id number] Date of Birth: [birth year]  [facility] of [facility]) CM/SW Contact:    [name] E [name], LCSW Phone Number: 10/07/2023, 10:21 AM   Clinical Narrative: Disoriented, impulsive per flowsheets.   CAGE-AID Screening: Substance Abuse Screening unable to be completed due to: : Patient unable to participate

## 2023-10-07 NOTE — Plan of Care (Signed)
  Problem: Coping: Goal: Will verbalize positive feelings about self Outcome: Not Progressing   Problem: Nutrition: Goal: Risk of aspiration will decrease Outcome: Not Progressing   Problem: Nutrition: Goal: Adequate nutrition will be maintained Outcome: Not Progressing   Problem: Coping: Goal: Level of anxiety will decrease Outcome: Not Progressing   Patient refused all sips of beverage and food. Son and daughter in law at bedside and ordered lunch, patient refused all oral intake. Patient removed external catheter and son would like nursing to hold off on reapplying. Patient currently agitated with bilateral upper and lower extremities restraints, asked cut these off of me. Patient is high fall risk, RN has educated family and patient on fall and safety precautions (bed alarm, floor mats, bed in lowest position). Patient's family verbalize understanding.

## 2023-10-08 ENCOUNTER — Ambulatory Visit: Admitting: Pulmonary Disease

## 2023-10-08 ENCOUNTER — Encounter: Payer: Self-pay | Admitting: Family Medicine

## 2023-10-08 DIAGNOSIS — G459 Transient cerebral ischemic attack, unspecified: Secondary | ICD-10-CM | POA: Diagnosis not present

## 2023-10-08 DIAGNOSIS — G9341 Metabolic encephalopathy: Secondary | ICD-10-CM | POA: Diagnosis not present

## 2023-10-08 DIAGNOSIS — R112 Nausea with vomiting, unspecified: Secondary | ICD-10-CM | POA: Diagnosis not present

## 2023-10-08 LAB — MAGNESIUM: Magnesium: 2.4 mg/dL (ref 1.7–2.4)

## 2023-10-08 LAB — BASIC METABOLIC PANEL WITH GFR
Anion gap: 12 (ref 5–15)
BUN: 10 mg/dL (ref 8–23)
CO2: 20 mmol/L — ABNORMAL LOW (ref 22–32)
Calcium: 9.1 mg/dL (ref 8.9–10.3)
Chloride: 102 mmol/L (ref 98–111)
Creatinine, Ser: 0.94 mg/dL (ref 0.61–1.24)
GFR, Estimated: >60 mL/min (ref >60)
Glucose, Bld: 106 mg/dL — ABNORMAL HIGH (ref 70–99)
Potassium: 4 mmol/L (ref 3.5–5.1)
Sodium: 134 mmol/L — ABNORMAL LOW (ref 135–145)

## 2023-10-08 NOTE — Evaluation (Signed)
 Physical Therapy Brief Evaluation and Discharge Note Patient Details Name: Eric Cook MRN: [id number] DOB: 23-Oct-1945 Today's Date: 10/08/2023   History of Present Illness  78 yo male adm 10/05/23 with slurred speech, HA, AMS and HTn. MRI (-). Course complicated by delirium. PMHx: seizure disorder, HFpEF, HTN, HLD, COPD, BPH, UB lymphedema  Clinical Impression  Pt pleasant with noted cognitive and balance deficits. Pt lives at home alone with son who checks in on him and still drives. Encouraged pt and family not to have pt driving, assist with medication and bills, increased cameras in home for safety and to look into fall alert bracelet/watch for home use. Pt would benefit from HHPT for safety and balance assessment in home environment. Encouraged mobility acutely with staff. No further acute therapy needs will defer to OT and mobility.       PT Assessment All further PT needs can be met in the next venue of care  Assistance Needed at Discharge  Intermittent Supervision/Assistance    Equipment Recommendations None recommended by PT  Recommendations for Other Services       Precautions/Restrictions Precautions Precautions: Fall Recall of Precautions/Restrictions: Impaired        Mobility  Bed Mobility   Supine/Sidelying to sit: Modified independent (Device/Increased time)      Transfers Overall transfer level: Needs assistance   Transfers: Sit to/from Stand Sit to Stand: Contact guard assist           General transfer comment: CGA with initial posterior bias in standing    Ambulation/Gait Ambulation/Gait assistance: Supervision Gait Distance (Feet): 400 Feet Assistive device: None Gait Pattern/deviations: Step-through pattern, Decreased stride length Gait Speed: Pace WFL General Gait Details: pt able to perform vertical and horizontal head turns and change of direction and speed. cues for direction  Home Activity Instructions    Stairs Stairs:  Yes Stairs assistance: Modified independent (Device/Increase time) Stair Management: One rail Right, Alternating pattern, Forwards Number of Stairs: 5 General stair comments: steady with use of rail  Modified Rankin (Stroke Patients Only)        Balance Overall balance assessment: Mild deficits observed, not formally tested   Sitting balance-Leahy Scale: Good     Standing balance support: During functional activity, No upper extremity supported Standing balance-Leahy Scale: Fair            Pertinent Vitals/Pain PT - Brief Vital Signs All Vital Signs Stable: Yes Pain Assessment Pain Assessment: No/denies pain     Home Living Family/patient expects to be discharged to:: Private residence Living Arrangements: Alone Available Help at Discharge: Family;Available PRN/intermittently Home Environment: Stairs to enter;Rail - left  Stairs-Number of Steps: 4 Home Equipment: Grab bars - toilet   Additional Comments: Son can monitor via cameras in the home; son plans to stay with him initally for safety    Prior Function Level of Independence: Independent      UE/LE Assessment   UE ROM/Strength/Tone/Coordination: WFL    LE ROM/Strength/Tone/Coordination: [facility]      Communication   Communication Communication: No apparent difficulties     Cognition Overall Cognitive Status: Impaired Comments: oriented, difficulty recalling family members names and relation. initially stated president as Reggy but then corrected. Able to count backward from 50 by 3 with 2 errors. could not spell world backward.     General Comments General comments (skin integrity, edema, etc.): discussed safety and recommendations with pt and son; agreeable for assist with driving, med mgmt, finances and cooking at this time.  Exercises     Assessment/Plan    PT Problem List Decreased balance;Decreased cognition       PT Visit Diagnosis Unsteadiness on feet (R26.81);Other abnormalities  of gait and mobility (R26.89)    No Skilled PT     Co-evaluation                AMPAC 6 Clicks Help needed turning from your back to your side while in a flat bed without using bedrails?: None Help needed moving from lying on your back to sitting on the side of a flat bed without using bedrails?: None Help needed moving to and from a bed to a chair (including a wheelchair)?: A Little Help needed standing up from a chair using your arms (e.g., wheelchair or bedside chair)?: A Little Help needed to walk in hospital room?: A Little Help needed climbing 3-5 steps with a railing? : A Little 6 Click Score: 20      End of Session Equipment Utilized During Treatment: Gait belt Activity Tolerance: Patient tolerated treatment well Patient left: in bed;with call bell/phone within reach;with family/visitor present;with bed alarm set Nurse Communication: Mobility status PT Visit Diagnosis: Unsteadiness on feet (R26.81);Other abnormalities of gait and mobility (R26.89)     Time: 8689-8670 PT Time Calculation (min) (ACUTE ONLY): 19 min  Charges:   PT Evaluation $PT Eval Low Complexity: 1 Low      Ethanael Veith P, PT Acute Rehabilitation Services Office: [phone]   Khloi Rawl B Tanashia Ciesla  10/08/2023, 1:40 PM

## 2023-10-08 NOTE — Evaluation (Signed)
 Occupational Therapy Evaluation Patient Details Name: Eric Cook MRN: [id number] DOB: [birth year] Today's Date: 10/08/2023   History of Present Illness   78 yo male adm 10/05/23 with slurred speech, HA, AMS and HTn. MRI (-). Course complicated by delirium. PMHx: seizure disorder, HFpEF, HTN, HLD, COPD, BPH, UB lymphedema     Clinical Impressions PTA patient independent and driving. Admitted for above and limited by problem list below, including mild instability, decreased activity tolerance and impaired cognition (dual tasking, sequencing, recall, problem solving, and safety).  Pt EOB upon entry, completing transfers and mobility with contact guard fading to close supervision for safety, contact guard to supervision for ADLs.  Discussed safety recommendations with pt and son, with recommendations for assist with driving, med mgmt, finances and cooking at this time - pt and son agree.  Son will be present 24/7 initially for safety.  Based on performance today, pt will best benefit from continued OT services acutely and after dc at [facility] level to optimize independence and safety with ADLs, IADLs and mobility.     If plan is discharge home, recommend the following:   A little help with walking and/or transfers;A little help with bathing/dressing/bathroom;Assistance with cooking/housework;Direct supervision/assist for medications management;Direct supervision/assist for financial management;Help with stairs or ramp for entrance;Assist for transportation;Supervision due to cognitive status     Functional Status Assessment   Patient has had a recent decline in their functional status and demonstrates the ability to make significant improvements in function in a reasonable and predictable amount of time.     Equipment Recommendations   Tub/shower seat     Recommendations for Other Services         Precautions/Restrictions   Precautions Precautions: Fall Recall of  Precautions/Restrictions: Impaired Restrictions Weight Bearing Restrictions Per Provider Order: No     Mobility Bed Mobility               General bed mobility comments: EOB upon entry and at exit    Transfers Overall transfer level: Needs assistance Equipment used: None Transfers: Sit to/from Stand Sit to Stand: Contact guard assist, Supervision           General transfer comment: contact guard fading to supervision, mild unsteadiness with inital LOB needing steadying support      Balance Overall balance assessment: Needs assistance Sitting-balance support: No upper extremity supported, Feet supported Sitting balance-Leahy Scale: Good     Standing balance support: During functional activity, No upper extremity supported Standing balance-Leahy Scale: Fair                             ADL either performed or assessed with clinical judgement   ADL Overall ADL's : Needs assistance/impaired     Grooming: Supervision/safety;Wash/dry hands;Standing           Upper Body Dressing : Supervision/safety;Sitting   Lower Body Dressing: Supervision/safety;Sit to/from stand   Toilet Transfer: Electronics engineer Details (indicate cue type and reason): CGA fading to supervision Toileting- Clothing Manipulation and Hygiene: Supervision/safety;Sit to/from stand       Functional mobility during ADLs: Supervision/safety;Cueing for safety       Vision   Vision Assessment?: No apparent visual deficits Additional Comments: wears glasses, vision not formally tested     Perception         Praxis         Pertinent Vitals/Pain Pain Assessment Pain Assessment: No/denies pain  Extremity/Trunk Assessment Upper Extremity Assessment Upper Extremity Assessment: Generalized weakness   Lower Extremity Assessment Lower Extremity Assessment: Defer to PT evaluation       Communication Communication Communication: No  apparent difficulties   Cognition Arousal: Alert Behavior During Therapy: WFL for tasks assessed/performed Cognition: Cognition impaired     Awareness: Online awareness impaired, Intellectual awareness intact Memory impairment (select all impairments): Short-term memory, Working memory Attention impairment (select first level of impairment): Selective attention Executive functioning impairment (select all impairments): Sequencing, Problem solving, Organization OT - Cognition Comments: pt oriented, follow simple commands but demonstrates difficulty with dual tasking as well as sequencing.  Decreased recall of task in busy enviornment.                 Following commands: Impaired Following commands impaired: Follows one step commands with increased time, Follows multi-step commands inconsistently     Cueing  General Comments   Cueing Techniques: Verbal cues  discussed safety and recommendations with pt and son; agreeable for assist with driving, med mgmt, finances and cooking at this time.   Exercises     Shoulder Instructions      Home Living Family/patient expects to be discharged to:: Private residence Living Arrangements: Alone Available Help at Discharge: Family;Available PRN/intermittently Type of Home: House Home Access: Stairs to enter [employer] of Steps: 4 Entrance Stairs-Rails: Right Home Layout: One level     Bathroom Shower/Tub: Chief Strategy Officer: Handicapped height     Home Equipment: Grab bars - toilet   Additional Comments: Son can monitor via cameras in the home; son plans to stay with him initally for safety      Prior Functioning/Environment Prior Level of Function : Independent/Modified Independent;Driving             Mobility Comments: no AD ADLs Comments: ind ADLs, IADLs, son assists with yardwork, pt manages meds    OT Problem List: Decreased strength;Decreased activity tolerance;Impaired balance  (sitting and/or standing);Decreased knowledge of precautions;Decreased knowledge of use of DME or AE;Decreased safety awareness;Decreased cognition   OT Treatment/Interventions: Self-care/ADL training;Therapeutic exercise;DME and/or AE instruction;Therapeutic activities;Patient/family education;Balance training;Cognitive remediation/compensation;Energy conservation      OT Goals(Current goals can be found in the care plan section)   Acute Rehab OT Goals Patient Stated Goal: home OT Goal Formulation: With patient Time For Goal Achievement: 10/22/23 Potential to Achieve Goals: Good   OT Frequency:  Min 2X/week    Co-evaluation              AM-PAC OT 6 Clicks Daily Activity     Outcome Measure Help from another person eating meals?: None Help from another person taking care of personal grooming?: A Little Help from another person toileting, which includes using toliet, bedpan, or urinal?: A Little Help from another person bathing (including washing, rinsing, drying)?: A Little Help from another person to put on and taking off regular upper body clothing?: A Little Help from another person to put on and taking off regular lower body clothing?: A Little 6 Click Score: 19   End of Session Equipment Utilized During Treatment: Gait belt Nurse Communication: Mobility status  Activity Tolerance: Patient tolerated treatment well Patient left: with call bell/phone within reach;with family/visitor present;Other (comment) (sitting at EOB)  OT Visit Diagnosis: Other abnormalities of gait and mobility (R26.89);Other symptoms and signs involving cognitive function                Time: 8773-8751 OT Time Calculation (min): 22 min  Charges:  OT General Charges $OT Visit: 1 Visit OT Evaluation $OT Eval Moderate Complexity: 1 Mod  Etta NOVAK, OT Acute Rehabilitation Services Office [phone] Secure Chat Preferred    Etta GORMAN Hope 10/08/2023, 1:07 PM

## 2023-10-08 NOTE — Progress Notes (Signed)
 [facility] PROGRESS NOTE    Progress Note  Eric Cook  FMW:[id number] DOB: 1945/11/24 DOA: 10/05/2023 PCP: Jolinda Norene HERO, DO     Brief Narrative:   Eric Cook is an 78 y.o. male past medical history complex partial seizures, HFpEF, essential hypertension, BPH, chronic lymphedema, seen in the ED recently for hypertensive urgency some medications were added and discharged home, since then he has been slightly confused, son also noted some slurred speech and nonsensical wording.  Woke up in the morning of admission with right-sided headache,some left-sided weakness blurry vision and double vision which is now resolved, but he still has some left hand weakness.  He also has some abdominal pain with nausea and vomiting in the ED  Significant Events: MRI of the brain showed no acute findings just generalized atrophy. Chest x-ray no acute findings  Assessment/Plan:  TIA with large left ICA stenosis: MRI of the brain showed no acute findings CT angio of the head and neck showed radiographic stranding Sign stenosis of right ICA and left ICA high-grade stenosis. Neurology was consulted.   Recommended to continue DAPT therapy aspirin  and Plavix  for 21 days then aspirin  alone. Allow permissive hypertension. EEG showed no evidence of seizures No events on continue telemonitoring. Vascular surgery recommended transcarotid artery revascularization on 10/19/2023 PT OT evaluated the patient in follow-up.  Epigastric abdominal pain nausea vomiting: Tolerating regular diet, CT scan of the abdomen pelvis showed some esophageal thickening.  No ileus or SBO. Continue Protonix  twice a day.  No alarming symptoms. He has a history of GERD  Acute Confusional state/delirium: Avoid narcotics and benzodiazepines. Continue Haldol  as needed Seroquel  at night. Acute confusional state has resolved.  Sinus tach with PVCs: Continue metoprolol  IV as needed.  Acute respiratory failure with  hypoxia: Was satting 86% on room air placed on 6 L now greater than 90. Chest x-ray is clear. Continue incentive spirometry and inhalers. Has been weaned to room air. Continue symptoms monitoring flutter valve.  Recent HTN (hypertension) urgency: Avoid hypotension. Discontinue losartan  and amlodipine .  High anion gap metabolic acidosis: Continue IV fluids.  History of complex partial seizures: Resume Keppra .  Management as above.  HFpEF: Appears euvolemic without exacerbation.  Continue metoprolol .  COPD, Continue inhalers.  BPH (benign prostatic hyperplasia) Continue Flomax .  Closed compression fracture of L3 lumbar vertebra, initial encounter (HCC) CT scan of the abdomen pelvis showed a T12 compression fracture about 40% height loss.  Some distal esophageal wall thickening. Continue Protonix  twice a day.   DVT prophylaxis: lovenox  Family Communication:non Status is: Observation The patient remains OBS appropriate and will d/c before 2 midnights.    Code Status:     Code Status Orders  (From admission, onward)           Start     Ordered   10/06/23 0516  Full code  Continuous       Question:  By:  Answer:  Other   10/06/23 0518           Code Status History     Date Active Date Inactive Code Status Order ID Comments User Context   08/03/2022 1524 08/04/2022 2228 Full Code [id number]  Laurita Cort DASEN, MD ED   08/23/2019 1527 08/25/2019 2327 Full Code [id number]  Norins, Ozell BRAVO, MD ED         IV Access:   Peripheral IV   Procedures and diagnostic studies:   ECHOCARDIOGRAM COMPLETE Result Date: 10/07/2023    ECHOCARDIOGRAM REPORT  Patient Name:   Eric Cook Date of Exam: 10/07/2023 Medical Rec #:  [id number]       Height:       69.0 in Accession #:    [id number]      Weight:       205.0 lb Date of Birth:  04-Feb-1945       BSA:          2.088 m Patient Age:    78 years        BP:           120/78 mmHg Patient Gender: M               HR:            91 bpm. Exam Location:  Inpatient Procedure: 2D Echo, Cardiac Doppler, Color Doppler and Intracardiac            Opacification Agent (Both Spectral and Color Flow Doppler were            utilized during procedure). Indications:    TIA  History:        Patient has prior history of Echocardiogram examinations, most                 recent 08/04/2022. Abnormal ECG, COPD; Risk Factors:Hypertension                 and Dyslipidemia.  Sonographer:    Ellouise Mose RDCS Referring Phys: [id number] [facility]  Sonographer Comments: Technically difficult study due to poor echo windows. Image acquisition challenging due to patient body habitus. Patient supine with restraints. Could not turn. IMPRESSIONS  1. Left ventricular ejection fraction, by estimation, is 55 to 60%. The left ventricle has normal function. The left ventricle has no regional wall motion abnormalities. Left ventricular diastolic parameters are consistent with Grade I diastolic dysfunction (impaired relaxation).  2. Right ventricular systolic function is normal. The right ventricular size is normal. Tricuspid regurgitation signal is inadequate for assessing PA pressure.  3. The mitral valve is normal in structure. Mild mitral valve regurgitation. No evidence of mitral stenosis.  4. The aortic valve is tricuspid. Aortic valve regurgitation is not visualized. No aortic stenosis is present. FINDINGS  Left Ventricle: Left ventricular ejection fraction, by estimation, is 55 to 60%. The left ventricle has normal function. The left ventricle has no regional wall motion abnormalities. Definity  contrast agent was given IV to delineate the left ventricular  endocardial borders. The left ventricular internal cavity size was normal in size. There is no left ventricular hypertrophy. Left ventricular diastolic parameters are consistent with Grade I diastolic dysfunction (impaired relaxation). Right Ventricle: The right ventricular size is normal. No increase in right  ventricular wall thickness. Right ventricular systolic function is normal. Tricuspid regurgitation signal is inadequate for assessing PA pressure. Left Atrium: Left atrial size was normal in size. Right Atrium: Right atrial size was normal in size. Pericardium: There is no evidence of pericardial effusion. Mitral Valve: The mitral valve is normal in structure. Mild mitral valve regurgitation. No evidence of mitral valve stenosis. Tricuspid Valve: The tricuspid valve is normal in structure. Tricuspid valve regurgitation is trivial. No evidence of tricuspid stenosis. Aortic Valve: The aortic valve is tricuspid. Aortic valve regurgitation is not visualized. No aortic stenosis is present. Pulmonic Valve: The pulmonic valve was normal in structure. Pulmonic valve regurgitation is not visualized. No evidence of pulmonic stenosis. Aorta: The aortic root is normal in size and structure. IAS/Shunts: No atrial level  shunt detected by color flow Doppler.  LEFT VENTRICLE PLAX 2D LVIDd:         4.90 cm     Diastology LVIDs:         3.50 cm     LV e' medial:    6.42 cm/s LV PW:         1.10 cm     LV E/e' medial:  13.7 LV IVS:        0.90 cm     LV e' lateral:   9.36 cm/s LVOT diam:     2.10 cm     LV E/e' lateral: 9.4 LV SV:         68 LV SV Index:   33 LVOT Area:     3.46 cm  LV Volumes (MOD) LV vol d, MOD A2C: 73.6 ml LV vol d, MOD A4C: 70.4 ml LV vol s, MOD A2C: 32.4 ml LV vol s, MOD A4C: 25.4 ml LV SV MOD A2C:     41.2 ml LV SV MOD A4C:     70.4 ml LV SV MOD BP:      46.6 ml RIGHT VENTRICLE             IVC RV S prime:     10.10 cm/s  IVC diam: 1.90 cm TAPSE (M-mode): 1.9 cm LEFT ATRIUM             Index        RIGHT ATRIUM           Index LA diam:        2.80 cm 1.34 cm/m   RA Area:     12.10 cm LA Vol (A2C):   30.7 ml 14.70 ml/m  RA Volume:   22.20 ml  10.63 ml/m LA Vol (A4C):   35.5 ml 17.00 ml/m LA Biplane Vol: 33.5 ml 16.04 ml/m  AORTIC VALVE LVOT Vmax:   99.20 cm/s LVOT Vmean:  67.200 cm/s LVOT VTI:    0.196 m   AORTA Ao Root diam: 3.50 cm Ao Asc diam:  3.60 cm MITRAL VALVE MV Area (PHT): 4.10 cm     SHUNTS MV Decel Time: 185 msec     Systemic VTI:  0.20 m MV E velocity: 88.05 cm/s   Systemic Diam: 2.10 cm MV A velocity: 137.00 cm/s MV E/A ratio:  0.64 Aditya Sabharwal Electronically signed by Ria Commander Signature Date/Time: 10/07/2023/1:06:16 PM    Final    EEG adult Result Date: 10/07/2023 Matthews Elida HERO, MD     10/07/2023  7:28 AM Routine EEG Report Eric Cook is a 78 y.o. male with a history of altered mental status who is undergoing an EEG to evaluate for seizures. Report: This EEG was acquired with electrodes placed according to the International 10-20 electrode system (including Fp1, Fp2, F3, F4, C3, C4, P3, P4, O1, O2, T3, T4, T5, T6, A1, A2, Fz, Cz, Pz). The following electrodes were missing or displaced: none. The occipital dominant rhythm was 8.5 Hz. This activity is reactive to stimulation. Drowsiness was manifested by background fragmentation; deeper stages of sleep were not identified. There was no focal slowing. There were no interictal epileptiform discharges. There were no electrographic seizures identified. There was no abnormal response to photic stimulation or hyperventilation. Impression: This EEG was obtained while awake and drowsy and is normal.   Clinical Correlation: Normal EEGs, however, do not rule out epilepsy. Elida Matthews, MD [facility] [phone] If 7pm- 7am, please page neurology on  call as listed in AMION.   CT ABDOMEN PELVIS W CONTRAST Result Date: 10/06/2023 CLINICAL DATA:  Acute abdominal pain EXAM: CT ABDOMEN AND PELVIS WITH CONTRAST TECHNIQUE: Multidetector CT imaging of the abdomen and pelvis was performed using the standard protocol following bolus administration of intravenous contrast. RADIATION DOSE REDUCTION: This exam was performed according to the departmental dose-optimization program which includes automated exposure control, adjustment of the  mA and/or kV according to patient size and/or use of iterative reconstruction technique. CONTRAST:  75mL OMNIPAQUE  IOHEXOL  350 MG/ML SOLN COMPARISON:  07/12/2023 FINDINGS: Lower chest: Coronary and aortic atherosclerosis. Mild mitral valve calcification. Distal esophageal wall thickening is nonspecific although esophagitis would be a common cause. Emphysema noted. Scarring or atelectasis anteromedially in the right middle lobe. Hepatobiliary: Unremarkable Pancreas: Unremarkable Spleen: Unremarkable Adrenals/Urinary Tract: Contrast medium in the collecting systems in urinary bladder compatible with prior contrast injection for CT angiogram; this lowers sensitivity for nonobstructive renal calculi but no hydronephrosis or hydroureter is present. Stable renal cysts warrant no further imaging workup. Stomach/Bowel: Mild sigmoid colon diverticulosis. Vascular/Lymphatic: Heavy atheromatous vascular calcification of the abdominal aorta and its branches. Atheromatous plaque at the origins of the celiac trunk and SMA and scattered further distally in the SMA. No overt occlusion of either of these vessels. Reproductive: Unremarkable Other: No supplemental non-categorized findings. Musculoskeletal: Compression fractures at T12, L3, L4, and L5. The T12 compression fracture demonstrates about 40% loss of vertebral height (formerly 25% on 07/12/2023) and increased sclerosis compatible with progressive collapse. Small supraumbilical hernia contains adipose tissue. IMPRESSION: 1. Progressive collapse of the T12 compression fracture, now with about 40% loss of vertebral height. 2. Distal esophageal wall thickening is nonspecific although esophagitis would be a common cause. 3. Mild sigmoid colon diverticulosis. 4. Small supraumbilical hernia contains adipose tissue. 5. Aortic Atherosclerosis (ICD10-I70.0) and Emphysema (ICD10-J43.9). Systemic and coronary atherosclerosis. Mild mitral valve calcification. Electronically Signed   By:  Ryan Salvage M.D.   On: 10/06/2023 14:28   CT ANGIO HEAD NECK W WO CM Result Date: 10/06/2023 CLINICAL DATA:  78 year old male with altered mental status, headache, neurologic deficit. EXAM: CT ANGIOGRAPHY HEAD AND NECK WITH AND WITHOUT CONTRAST TECHNIQUE: Multidetector CT imaging of the head and neck was performed using the standard protocol during bolus administration of intravenous contrast. Multiplanar CT image reconstructions and MIPs were obtained to evaluate the vascular anatomy. Carotid stenosis measurements (when applicable) are obtained utilizing NASCET criteria, using the distal internal carotid diameter as the denominator. RADIATION DOSE REDUCTION: This exam was performed according to the departmental dose-optimization program which includes automated exposure control, adjustment of the mA and/or kV according to patient size and/or use of iterative reconstruction technique. CONTRAST:  75mL OMNIPAQUE  IOHEXOL  350 MG/ML SOLN COMPARISON:  Head CT and brain MRI earlier today and last night. Previous ultrasound thyroid  imaging 06/20/2022 and earlier. FINDINGS: CTA NECK Skeleton: Absent maxillary dentition. Chronic lower cervical disc and endplate degeneration C4-C5, C5-C6. No acute osseous abnormality identified. Upper chest: Bilateral emphysema. Mild superimposed atelectasis. Negative visible superior mediastinum. Other neck: Nonvascular neck soft tissue spaces appear negative. Aortic arch: Advanced Calcified aortic atherosclerosis. Three vessel arch. Right carotid system: Brachiocephalic artery, right CCA origin soft and calcified plaque without stenosis. Dense right subclavian venous contrast streak artifact obscures a portion of the proximal right CCA. Soft and calcified right CCA plaque before the bifurcation without stenosis. Relatively mild calcified plaque at the right carotid bifurcation. However, bulky 7-8 mm segment of calcified plaque at the distal bulb with RADIOGRAPHIC STRING SIGN  STENOSIS (series 9, image 149 and series 7, image 107). The right ICA remains patent with no additional plaque to the skull base. Left carotid system: Left CCA origin plaque without stenosis. Left CCA tortuosity and additional soft and calcified plaque without stenosis before the bifurcation. Moderate calcified left ICA origin. Heavily calcified distal left ICA bulb with high-grade stenosis approaching a radiographic string sign on series 9, image 214. Stenosis here is numerically estimated at 74 % with respect to the distal vessel. Left ICA remains patent with mild ectasia between the stenosis in the skull base. Vertebral arteries: Proximal right subclavian artery calcified plaque without stenosis. Right vertebral artery origin adjacent calcified plaque with mild stenosis (series 8, image 130). Patent right vertebral artery to the skull base with no other plaque or stenosis in the neck. Proximal left subclavian artery calcified plaque without stenosis. Calcified plaque adjacent to the left vertebral artery origin with mild stenosis on series 8, image 130. Tortuous left V1 segment. Mildly dominant left vertebral artery is patent to the skull base with no other plaque or stenosis. CTA HEAD Posterior circulation: Heavily calcified distal vertebral arteries, dominant distal left vertebral artery stenosis is moderate to severe on series 5, image 152 just proximal to the patent left PICA origin. Moderate non dominant right V4 segment calcified plaque and stenosis on image 156. Normal downstream right PICA origin. Additional moderate to severe stenosis of the right vertebral artery at the vertebrobasilar junction. Moderate contralateral left vertebrobasilar junction stenosis also (series 8, image 162). Patent basilar artery with mild tortuosity and irregularity. No basilar stenosis. Normal SCA and PCA origins. Small posterior communicating arteries. Left PCA is patent with mild irregularity. Right PCA P2 segment  demonstrates mild to moderate long segment irregularity and stenosis on series 10, image 18. Distal right PCA branches are patent. Anterior circulation: Both ICA siphons are patent with bulky siphon calcified plaque. On the left only mild siphon stenosis. On the right moderate stenosis of the proximal cavernous segment suspected on series 5, image 119. Patent carotid termini. Normal posterior communicating artery origins. Normal MCA and left ACA origins. Diminutive or absent right ACA A1 segment. Normal anterior communicating artery, tortuous proximal A2 segments. Bilateral ACA branches are within normal limits. Left MCA M1 segment and bifurcation are patent, mildly tortuous without stenosis. Right MCA M1 segment and trifurcation are patent and mildly tortuous without stenosis. Bilateral MCA branches are within normal limits. Venous sinuses: Early contrast timing, grossly patent. Anatomic variants: Dominant left vertebral artery. Left ACA A1 is dominant, the right is diminutive or absent. Review of the MIP images confirms the above findings IMPRESSION: 1. Advanced atherosclerosis from the aortic arch, throughout the neck, and at the skull base. No large vessel occlusion, but Positive for: - RICA bulb RADIOGRAPHIC STRING SIGN STENOSIS. - LICA bulb high-grade stenosis, estimated 74% and approaching a String sign. - bilateral distal Vertebral Artery and Vertebrobasilar junction Moderate to Severe stenoses (maximal left proximal V4 segment). - up to Moderate stenosis cavernous Right ICA siphon. 2.  Aortic Atherosclerosis (ICD10-I70.0). 3.  Emphysema (ICD10-J43.9). Electronically Signed   By: VEAR Hurst M.D.   On: 10/06/2023 07:22   DG Abd 1 View Result Date: 10/06/2023 EXAM: 1 VIEW XRAY OF THE ABDOMEN 10/06/2023 06:45:00 AM COMPARISON: CT 07/12/2023 CLINICAL HISTORY: Abdominal distension; Vomiting. Reason for exam: pt order states abdominal distention; vomiting ; Per ED triage notes: The pt has been confused  intermittently for awhile yesterday he drove himself to [facility] and was seen there. Today  the pt called the son and told him that he had taken a nap and when he woke up he felt bad and tried to call; his son and could not dial the phone. His son reported that the pt has not been his self for awhile pa saw here in triage FINDINGS: BOWEL: Gaseous distention of the stomach, mild. In the left lower quadrant of the abdomen there are a few air-filled loops of small bowel measuring up to 2.6 cm. Gas and stool noted within the colon up to the rectum. SOFT TISSUES: No opaque urinary calculi. Vascular calcifications. BONES: No acute osseous abnormality. Multilevel thoracolumbar degenerative changes. IMPRESSION: 1. Nonspecific bowel gas pattern with a few mildly distended air-filled loops of small bowel in the left lower quadrant of the abdomen. No signs of high-grade bowel obstruction. Electronically signed by: Waddell Calk MD 10/06/2023 06:56 AM EDT RP Workstation: [id number]     Medical Consultants:   None.   Subjective:    [name] vomiting resolved he is hungry this morning.  Objective:    Vitals:   10/07/23 2122 10/07/23 2253 10/08/23 0323 10/08/23 0500  BP: (!) 189/89 (!) 171/89 (!) 167/93   Pulse: 87 79 73   Resp:  18 18   Temp:  97.6 F (36.4 C) 98.2 F (36.8 C)   TempSrc:  Oral Oral   SpO2:  93% 92%   Weight:    89.7 kg  Height:       SpO2: 92 % O2 Flow Rate (L/min): 2 L/min   Intake/Output Summary (Last 24 hours) at 10/08/2023 0624 Last data filed at 10/07/2023 2253 Gross per 24 hour  Intake 935.05 ml  Output 350 ml  Net 585.05 ml   Filed Weights   10/05/23 2007 10/08/23 0500  Weight: 93 kg 89.7 kg    Exam: General exam: In no acute distress. Respiratory system: Good air movement and clear to auscultation. Cardiovascular system: S1 & S2 heard, RRR. No JVD. Gastrointestinal system: Abdomen is nondistended, soft and nontender.  Extremities: No pedal  edema. Skin: No rashes, lesions or ulcers Psychiatry: Judgement and insight appear normal. Mood & affect appropriate.  Data Reviewed:    Labs: Basic Metabolic Panel: Recent Labs  Lab 10/04/23 1126 10/05/23 1951 10/05/23 2008 10/06/23 0714 10/07/23 0609  NA 139 139 140 137 142  K 4.1 3.5 3.6 3.6 3.5  CL 103 104 104 102 108  CO2 22  --  20* 25 19*  GLUCOSE 98 124* 124* 136* 108*  BUN 17 23 21 22 11   CREATININE 1.20 1.20 1.15 1.05 0.88  CALCIUM  10.4*  --  9.4 9.0 9.8  MG  --   --   --  2.1  --   PHOS  --   --   --  3.9  --    GFR Estimated Creatinine Clearance: 77.9 mL/min (by C-G formula based on SCr of 0.88 mg/dL). Liver Function Tests: Recent Labs  Lab 10/05/23 2008  AST 31  ALT 23  ALKPHOS 75  BILITOT 1.0  PROT 7.3  ALBUMIN 4.6   No results for input(s): LIPASE, AMYLASE in the last 168 hours. No results for input(s): AMMONIA in the last 168 hours. Coagulation profile Recent Labs  Lab 10/05/23 2008  INR 1.0   COVID-19 Labs  No results for input(s): DDIMER, FERRITIN, LDH, CRP in the last 72 hours.  Lab Results  Component Value Date   SARSCOV2NAA NEGATIVE 10/06/2023   SARSCOV2NAA NEGATIVE 08/09/2022   SARSCOV2NAA NEGATIVE  08/03/2022   SARSCOV2NAA Not Detected 10/13/2020    CBC: Recent Labs  Lab 10/04/23 1126 10/05/23 1951 10/05/23 2008 10/06/23 0511 10/07/23 0609  WBC 5.6  --  8.0 11.2* 8.4  NEUTROABS 3.1  --  5.8  --   --   HGB 14.0 14.6 14.2 14.3 14.5  HCT 40.0 43.0 41.5 42.2 42.5  MCV 94.1  --  95.6 95.5 96.6  PLT 183  --  222 215 179   Cardiac Enzymes: No results for input(s): CKTOTAL, CKMB, CKMBINDEX, TROPONINI in the last 168 hours. BNP (last 3 results) Recent Labs    07/18/23 1010  PROBNP 746.0*   CBG: Recent Labs  Lab 10/07/23 1732  GLUCAP 105*   D-Dimer: No results for input(s): DDIMER in the last 72 hours. Hgb A1c: Recent Labs    10/06/23 0511  HGBA1C 5.0   Lipid Profile: Recent Labs     10/06/23 0511  CHOL 158  HDL 33*  LDLCALC 107*  TRIG 89  CHOLHDL 4.8   Thyroid  function studies: Recent Labs    10/07/23 0609  TSH 4.110   Anemia work up: No results for input(s): VITAMINB12, FOLATE, FERRITIN, TIBC, IRON, RETICCTPCT in the last 72 hours. Sepsis Labs: Recent Labs  Lab 10/04/23 1126 10/05/23 2008 10/06/23 0511 10/07/23 0609  WBC 5.6 8.0 11.2* 8.4  LATICACIDVEN  --   --  1.5  --    Microbiology Recent Results (from the past 240 hours)  Resp panel by RT-PCR (RSV, Flu A&B, Covid) Anterior Nasal Swab     Status: None   Collection Time: 10/06/23 12:47 AM   Specimen: Anterior Nasal Swab  Result Value Ref Range Status   SARS Coronavirus 2 by RT PCR NEGATIVE NEGATIVE Final   Influenza A by PCR NEGATIVE NEGATIVE Final   Influenza B by PCR NEGATIVE NEGATIVE Final    Comment: (NOTE) The Xpert Xpress SARS-CoV-2/FLU/RSV plus assay is intended as an aid in the diagnosis of influenza from Nasopharyngeal swab specimens and should not be used as a sole basis for treatment. Nasal washings and aspirates are unacceptable for Xpert Xpress SARS-CoV-2/FLU/RSV testing.  Fact Sheet for Patients: BloggerCourse.com  Fact Sheet for Healthcare Providers: SeriousBroker.it  This test is not yet approved or cleared by the [place]  FDA and has been authorized for detection and/or diagnosis of SARS-CoV-2 by FDA under an Emergency Use Authorization (EUA). This EUA will remain in effect (meaning this test can be used) for the duration of the COVID-19 declaration under Section 564(b)(1) of the Act, 21 U.S.C. section 360bbb-3(b)(1), unless the authorization is terminated or revoked.     Resp Syncytial Virus by PCR NEGATIVE NEGATIVE Final    Comment: (NOTE) Fact Sheet for Patients: BloggerCourse.com  Fact Sheet for Healthcare Providers: SeriousBroker.it  This  test is not yet approved or cleared by the [place]  FDA and has been authorized for detection and/or diagnosis of SARS-CoV-2 by FDA under an Emergency Use Authorization (EUA). This EUA will remain in effect (meaning this test can be used) for the duration of the COVID-19 declaration under Section 564(b)(1) of the Act, 21 U.S.C. section 360bbb-3(b)(1), unless the authorization is terminated or revoked.  Performed at [facility], [address] [place], [place] [address]      Medications:    aspirin  EC  81 mg Oral Daily   clopidogrel   75 mg Oral Daily   feeding supplement  237 mL Oral TID BM   levETIRAcetam   500 mg Oral QHS  metoprolol  tartrate  25 mg Oral BID   pantoprazole  (PROTONIX ) IV  40 mg Intravenous BID   rosuvastatin   20 mg Oral Daily   sertraline   50 mg Oral Daily   sodium chloride  flush  3 mL Intravenous Q12H   tamsulosin   0.4 mg Oral QPC supper   Continuous Infusions:  sodium chloride  75 mL/hr at 10/08/23 0142      LOS: 2 days   Erle Odell Castor  [facility]  10/08/2023, 6:24 AM

## 2023-10-08 NOTE — Plan of Care (Signed)
  Problem: Education: Goal: Knowledge of disease or condition will improve Outcome: Progressing   Problem: Coping: Goal: Will verbalize positive feelings about self Outcome: Progressing   Problem: Health Behavior/Discharge Planning: Goal: Ability to manage health-related needs will improve Outcome: Progressing Goal: Goals will be collaboratively established with patient/family Outcome: Progressing   Problem: Self-Care: Goal: Verbalization of feelings and concerns over difficulty with self-care will improve Outcome: Progressing Goal: Ability to communicate needs accurately will improve Outcome: Progressing   Problem: Nutrition: Goal: Dietary intake will improve Outcome: Progressing   Problem: Education: Goal: Knowledge of General Education information will improve Description: Including pain rating scale, medication(s)/side effects and non-pharmacologic comfort measures Outcome: Progressing   Problem: Health Behavior/Discharge Planning: Goal: Ability to manage health-related needs will improve Outcome: Progressing   Problem: Activity: Goal: Risk for activity intolerance will decrease Outcome: Progressing   Problem: Nutrition: Goal: Adequate nutrition will be maintained Outcome: Progressing   Problem: Pain Managment: Goal: General experience of comfort will improve and/or be controlled Outcome: Progressing

## 2023-10-08 NOTE — Progress Notes (Signed)
 Patient awake, oriented x3. Denies pain.  Patient has been calm and cooperative with this RN. He is not resisting care, answers questions appropriately. Nutrition and toiletting needs attended. Re-oriented to safety and use of call bell. Patient and family member verbalizing understanding. Restraints discontinued. Bed alarm on. Continue Soil scientist.

## 2023-10-09 DIAGNOSIS — R2990 Unspecified symptoms and signs involving the nervous system: Secondary | ICD-10-CM | POA: Diagnosis not present

## 2023-10-09 MED ORDER — PANTOPRAZOLE SODIUM 40 MG PO TBEC: 40.0000 mg | DELAYED_RELEASE_TABLET | Freq: Every day | ORAL | 0 refills | Status: AC

## 2023-10-09 MED ORDER — CLOPIDOGREL BISULFATE 75 MG PO TABS: 75.0000 mg | ORAL_TABLET | Freq: Every day | ORAL | 0 refills | Status: DC

## 2023-10-09 MED ORDER — ROSUVASTATIN CALCIUM 20 MG PO TABS: 20.0000 mg | ORAL_TABLET | Freq: Every day | ORAL | 0 refills | Status: DC

## 2023-10-09 MED ORDER — ASPIRIN 81 MG PO TBEC: 81.0000 mg | DELAYED_RELEASE_TABLET | Freq: Every day | ORAL | 0 refills | Status: DC

## 2023-10-09 MED ORDER — METOPROLOL TARTRATE 25 MG PO TABS: 25.0000 mg | ORAL_TABLET | Freq: Two times a day (BID) | ORAL | 0 refills | Status: DC

## 2023-10-09 MED ORDER — TAMSULOSIN HCL 0.4 MG PO CAPS: 0.4000 mg | ORAL_CAPSULE | Freq: Every day | ORAL | 0 refills | Status: AC

## 2023-10-09 NOTE — Progress Notes (Signed)
 Occupational Therapy Treatment Patient Details Name: Eric Cook MRN: [id number] DOB: 1945-02-07 Today's Date: 10/09/2023   History of present illness 78 yo male adm 10/05/23 with slurred speech, HA, AMS and HTn. MRI (-). Course complicated by delirium. PMHx: seizure disorder, HFpEF, HTN, HLD, COPD, BPH, UB lymphedema   OT comments  Patient in recliner and agreeable to OT session. Reports eager to dc home, son plans to stay with him initially and son reports he has just setup a life alert button. He completes way finding task with min assist to use signs in hallway, and failed pill box test with 15 errors (see below for details) demonstrating deficits in attention, working memory, problem solving, organization and multitasking.  Pt with improving awareness of deficits after completion of pill box test. Pt and family agreeable to assist with IADLs (meds, meals, finances, and driving).  Continue to recommend HHOT service at dc.  Will follow acutely.       If plan is discharge home, recommend the following:  A little help with walking and/or transfers;A little help with bathing/dressing/bathroom;Assistance with cooking/housework;Direct supervision/assist for medications management;Direct supervision/assist for financial management;Help with stairs or ramp for entrance;Assist for transportation;Supervision due to cognitive status   Equipment Recommendations  Tub/shower seat    Recommendations for Other Services      Precautions / Restrictions Precautions Precautions: Fall Recall of Precautions/Restrictions: Impaired Restrictions Weight Bearing Restrictions Per Provider Order: No       Mobility Bed Mobility               General bed mobility comments: EOB upon entry and at exit    Transfers Overall transfer level: Needs assistance Equipment used: None Transfers: Sit to/from Stand Sit to Stand: Supervision           General transfer comment: close supervision for  safety, no LOB today     Balance Overall balance assessment: Needs assistance Sitting-balance support: No upper extremity supported, Feet supported Sitting balance-Leahy Scale: Good     Standing balance support: During functional activity, No upper extremity supported Standing balance-Leahy Scale: Fair                             ADL either performed or assessed with clinical judgement   ADL Overall ADL's : Needs assistance/impaired     Grooming: Supervision/safety;Wash/dry hands;Standing               Lower Body Dressing: Supervision/safety;Sit to/from stand   Toilet Transfer: Supervision/safety;Ambulation   Toileting- Clothing Manipulation and Hygiene: Supervision/safety;Sit to/from stand       Functional mobility during ADLs: Supervision/safety      Extremity/Trunk Assessment              Vision       Perception     Praxis     Communication Communication Communication: No apparent difficulties   Cognition Arousal: Alert Behavior During Therapy: WFL for tasks assessed/performed Cognition: Cognition impaired     Awareness: Online awareness impaired Memory impairment (select all impairments): Short-term memory, Working memory Attention impairment (select first level of impairment): Selective attention Executive functioning impairment (select all impairments): Sequencing, Problem solving, Organization, Reasoning OT - Cognition Comments: pt completed pill box test- see below.  Also completed way finding task with min cueing to locate room number (needing cueing to use signs in hallway).    Functional cognition further assessed with The Pillbox Test: A Measure of Executive Functioning and Estimate  of Medication Management. A straight pass/fail designation is determined by 3 or more errors of omission or misplacement on the task. Pt had a total of 15 errors.and failed the assessment, demonstrating deficits with planning, mental flexibility,  suboptimal search strategies, concrete thinking and difficulty with multitasking.  Errors: One tablet 3x/day - 14 errors (omission/misplacement) One tablet 2x/day with breakfast and dinner - 0 errors (omission/misplacement) One tablet in the morning - 0 errors (omission/misplacement) One tablet daily at bedtime  - 0 errors(omission/misplacement) One tablet every other day - 0 errors (omission/misplacement)    Total time to complete task  - 8 min  Pt demonstrated good attention to task, he correctly identified need to change 2x/day pill from am and lunch to am and PM; then when pointed error on 3x/day medication he was able to verbalize error. He voices difficulty paying attention to both numbers on the label.  Related difficulty to task to attention, working memory, organization and problem solving.  Pt and son voiced understanding and agree with recommendations for assist with IADLs.              Following commands: Impaired Following commands impaired: Follows one step commands with increased time, Follows multi-step commands inconsistently      Cueing   Cueing Techniques: Verbal cues  Exercises      Shoulder Instructions       General Comments reinforced safety recommendations after pill box test completed. Pt and son voice understanding.    Pertinent Vitals/ Pain       Pain Assessment Pain Assessment: No/denies pain  Home Living                                          Prior Functioning/Environment              Frequency  Min 2X/week        Progress Toward Goals  OT Goals(current goals can now be found in the care plan section)  Progress towards OT goals: Progressing toward goals  Acute Rehab OT Goals Patient Stated Goal: home OT Goal Formulation: With patient Time For Goal Achievement: 10/22/23 Potential to Achieve Goals: Good  Plan      Co-evaluation                 AM-PAC OT 6 Clicks Daily Activity     Outcome  Measure   Help from another person eating meals?: None Help from another person taking care of personal grooming?: A Little Help from another person toileting, which includes using toliet, bedpan, or urinal?: A Little Help from another person bathing (including washing, rinsing, drying)?: A Little Help from another person to put on and taking off regular upper body clothing?: A Little Help from another person to put on and taking off regular lower body clothing?: A Little 6 Click Score: 19    End of Session    OT Visit Diagnosis: Other abnormalities of gait and mobility (R26.89);Other symptoms and signs involving cognitive function   Activity Tolerance Patient tolerated treatment well   Patient Left with call bell/phone within reach;with family/visitor present;with chair alarm set;in chair   Nurse Communication Mobility status        Time: 8847-8776 OT Time Calculation (min): 31 min  Charges: OT General Charges $OT Visit: 1 Visit OT Treatments $Self Care/Home Management : 23-37 mins  Etta NOVAK, OT Acute Rehabilitation Services Office  [phone] Secure Chat Preferred    Etta GORMAN Hope 10/09/2023, 1:15 PM

## 2023-10-09 NOTE — TOC Progression Note (Signed)
 Transition of Care [facility]) - Progression Note    Patient Details  Name: Eric Cook MRN: [id number] Date of Birth: [birth year]  Transition of Care [facility]) CM/SW Contact  Nola Devere Hands, RN Phone Number: 10/09/2023, 10:30 AM  Clinical Narrative:    Case Manager spoke with patient's [name] [phone], concerning recommendation for Home Health therapy. He has no preference, referral called to Darleene Gowda, [facility] liaison. [name] asked that he be called to arrange visits. He plans to stay with his dad for a few days. No DME needed.    Expected Discharge Plan: Home w Home Health Services Barriers to Discharge: No Barriers Identified               Expected Discharge Plan and Services In-house Referral: NA Discharge Planning Services: CM Consult Post Acute Care Choice: Home Health Living arrangements for the past 2 months: Single Family Home                 DME Arranged: N/A DME Agency: NA       HH Arranged: PT, OT HH Agency: [facility] Date [facility] Agency Contacted: 10/09/23 Time HH Agency Contacted: 1028 Representative spoke with at [facility] Agency: Darleene   Social Drivers of Health (SDOH) Interventions SDOH Screenings   Food Insecurity: No Food Insecurity (10/06/2023)  Housing: Low Risk  (10/06/2023)  Transportation Needs: No Transportation Needs (10/06/2023)  Utilities: Not At Risk (10/06/2023)  Alcohol Screen: Low Risk  (06/24/2023)  Depression (PHQ2-9): Low Risk  (07/29/2023)  Financial Resource Strain: Patient Declined (07/29/2023)  Physical Activity: Sufficiently Active (07/29/2023)  Social Connections: Socially Isolated (10/06/2023)  Stress: No Stress Concern Present (07/29/2023)  Tobacco Use: Medium Risk (10/05/2023)  Health Literacy: Adequate Health Literacy (06/24/2023)    Readmission Risk Interventions     No data to display

## 2023-10-09 NOTE — Plan of Care (Signed)
  Problem: Education: Goal: Knowledge of disease or condition will improve Outcome: Progressing   Problem: Self-Care: Goal: Ability to participate in self-care as condition permits will improve Outcome: Progressing   Problem: Nutrition: Goal: Risk of aspiration will decrease Outcome: Progressing   Problem: Clinical Measurements: Goal: Ability to maintain clinical measurements within normal limits will improve Outcome: Progressing   Problem: Activity: Goal: Risk for activity intolerance will decrease Outcome: Progressing   Problem: Elimination: Goal: Will not experience complications related to bowel motility Outcome: Progressing Goal: Will not experience complications related to urinary retention Outcome: Progressing   Problem: Pain Managment: Goal: General experience of comfort will improve and/or be controlled Outcome: Progressing   Problem: Safety: Goal: Ability to remain free from injury will improve Outcome: Progressing

## 2023-10-09 NOTE — Discharge Summary (Signed)
 Physician Discharge Summary   Patient: Eric Cook MRN: [id number] DOB: 09/24/1945  Admit date:     10/05/2023  Discharge date: 10/09/23  Discharge Physician: Deliliah Room   PCP: Jolinda Norene HERO, DO   Recommendations at discharge:    Follow up with your PCP in one week  Follow up with neurology in one month.  F/u with vascular surgery for procedure on 10/21/23.   Discharge Diagnoses: Active Problems:   HTN (hypertension)   BPH (benign prostatic hyperplasia)   Acute metabolic encephalopathy   Closed compression fracture of L3 lumbar vertebra, initial encounter (HCC)   Chronic congestive heart failure with left ventricular diastolic dysfunction (HCC)   TIA (transient ischemic attack)  Hospital Course:   78 y.o. male past medical history complex partial seizures, HFpEF, essential hypertension, BPH, chronic lymphedema, seen in the ED recently for hypertensive urgency some medications were added and discharged home, since then he has been slightly confused, son also noted some slurred speech and nonsensical wording.  Woke up in the morning of admission with right-sided headache,some left-sided weakness blurry vision and double vision which is now resolved.  TIA with large left ICA stenosis: MRI of the brain showed no acute findings CT angio of the head and neck showed radiographic stranding Sign stenosis of right ICA and left ICA high-grade stenosis. Neurology was consulted.   Recommended to continue DAPT therapy aspirin  and Plavix  for 21 days then aspirin  alone. EEG showed no evidence of seizures No events on continue telemonitoring. Vascular surgery recommended transcarotid artery revascularization on 10/21/2023 by Dr Lanis. PT OT evaluated the patient  HHS on discharge.  Hypertensive urgency has resolved ARF with hypoxia has resolved.      Consultants: Neurology Procedures performed: None  Disposition: Home health Diet recommendation:  Cardiac diet DISCHARGE  MEDICATION: Allergies as of 10/09/2023   No Known Allergies      Medication List     STOP taking these medications    amLODipine  5 MG tablet Commonly known as: NORVASC    cimetidine  400 MG tablet Commonly known as: TAGAMET    losartan  100 MG tablet Commonly known as: Cozaar    simvastatin  20 MG tablet Commonly known as: ZOCOR        TAKE these medications    acetaminophen  500 MG tablet Commonly known as: TYLENOL  Take 500 mg by mouth as needed for moderate pain or mild pain (As Needed).   aspirin  EC 81 MG tablet Take 1 tablet (81 mg total) by mouth daily. Swallow whole. Start taking on: October 10, 2023   clopidogrel  75 MG tablet Commonly known as: PLAVIX  Take 1 tablet (75 mg total) by mouth daily. Start taking on: October 10, 2023   furosemide  20 MG tablet Commonly known as: LASIX  TAKE 1 TABLET (20 MG TOTAL) BY MOUTH DAILY AS NEEDED FOR FLUID.   levETIRAcetam  500 MG 24 hr tablet Commonly known as: KEPPRA  XR Take 1 tablet (500 mg total) by mouth at bedtime.   metoprolol  tartrate 25 MG tablet Commonly known as: LOPRESSOR  Take 1 tablet (25 mg total) by mouth 2 (two) times daily.   pantoprazole  40 MG tablet Commonly known as: Protonix  Take 1 tablet (40 mg total) by mouth daily.   rosuvastatin  20 MG tablet Commonly known as: CRESTOR  Take 1 tablet (20 mg total) by mouth daily. Start taking on: October 10, 2023   sertraline  50 MG tablet Commonly known as: ZOLOFT  TAKE 1 TABLET BY MOUTH EVERY DAY   tamsulosin  0.4 MG Caps capsule Commonly known as:  FLOMAX  Take 1 capsule (0.4 mg total) by mouth daily after supper. What changed: when to take this   traZODone  50 MG tablet Commonly known as: DESYREL  TAKE 0.5-1 TABLETS BY MOUTH AT BEDTIME AS NEEDED FOR SLEEP.        Follow-up Information     Whitfield Raisin, NP. Schedule an appointment as soon as possible for a visit in 1 month(s).   Specialty: Neurology Why: stroke clinic Contact information: [address] [place] [address] [phone]         Care, [facility] Follow up.   Specialty: Home Health Services Why: Someone from [facility] will contact you to arrange start date and time for your Physical therapy. Contact information: [address] [place] [address] [phone]         Jolinda Norene HERO, DO. Schedule an appointment as soon as possible for a visit in 1 week(s).   Specialty: Family Medicine Contact information: [address] [place] [address] [phone]                Discharge Exam: Fredricka Weights   10/05/23 2007 10/08/23 0500 10/09/23 0433  Weight: 93 kg 89.7 kg 89.4 kg   Constitutional: NAD, calm, comfortable Eyes: PERRL, lids and conjunctivae normal ENMT: Mucous membranes are moist. Posterior pharynx clear of any exudate or lesions.Normal dentition.  Neck: normal, supple, no masses, no thyromegaly Respiratory: clear to auscultation bilaterally, no wheezing, no crackles. Normal respiratory effort. No accessory muscle use.  Cardiovascular: Regular rate and rhythm, no murmurs / rubs / gallops. No extremity edema. 2+ pedal pulses. No carotid bruits.  Abdomen: no tenderness, no masses palpated. No hepatosplenomegaly. Bowel sounds positive.  Musculoskeletal: no clubbing / cyanosis. No joint deformity upper and lower extremities. Good ROM, no contractures. Normal muscle tone.  Skin: no rashes, lesions, ulcers. No induration Neurologic: CN 2-12 grossly intact. Sensation intact, DTR normal. Strength 5/5 x all 4 extremities.  Psychiatric: Normal judgment and insight. Alert and oriented x 3. Normal mood.    Condition at discharge: good  The results of significant diagnostics from this hospitalization (including imaging, microbiology, ancillary and laboratory) are listed below for reference.   Imaging Studies: ECHOCARDIOGRAM COMPLETE Result Date: 10/07/2023    ECHOCARDIOGRAM REPORT   Patient Name:   EVERARDO VORIS Date of Exam: 10/07/2023 Medical Rec #:  [id number]       Height:       69.0 in Accession #:    [id number]      Weight:       205.0 lb Date of Birth:  25-Jul-1945       BSA:          2.088 m Patient Age:    78 years        BP:           120/78 mmHg Patient Gender: M               HR:           91 bpm. Exam Location:  Inpatient Procedure: 2D Echo, Cardiac Doppler, Color Doppler and Intracardiac            Opacification Agent (Both Spectral and Color Flow Doppler were            utilized during procedure). Indications:    TIA  History:        Patient has prior history of Echocardiogram examinations, most  recent 08/04/2022. Abnormal ECG, COPD; Risk Factors:Hypertension                 and Dyslipidemia.  Sonographer:    Ellouise Mose RDCS Referring Phys: [id number] [facility]  Sonographer Comments: Technically difficult study due to poor echo windows. Image acquisition challenging due to patient body habitus. Patient supine with restraints. Could not turn. IMPRESSIONS  1. Left ventricular ejection fraction, by estimation, is 55 to 60%. The left ventricle has normal function. The left ventricle has no regional wall motion abnormalities. Left ventricular diastolic parameters are consistent with Grade I diastolic dysfunction (impaired relaxation).  2. Right ventricular systolic function is normal. The right ventricular size is normal. Tricuspid regurgitation signal is inadequate for assessing PA pressure.  3. The mitral valve is normal in structure. Mild mitral valve regurgitation. No evidence of mitral stenosis.  4. The aortic valve is tricuspid. Aortic valve regurgitation is not visualized. No aortic stenosis is present. FINDINGS  Left Ventricle: Left ventricular ejection fraction, by estimation, is 55 to 60%. The left ventricle has normal function. The left ventricle has no regional wall motion abnormalities. Definity  contrast agent was given IV to delineate the left ventricular  endocardial borders.  The left ventricular internal cavity size was normal in size. There is no left ventricular hypertrophy. Left ventricular diastolic parameters are consistent with Grade I diastolic dysfunction (impaired relaxation). Right Ventricle: The right ventricular size is normal. No increase in right ventricular wall thickness. Right ventricular systolic function is normal. Tricuspid regurgitation signal is inadequate for assessing PA pressure. Left Atrium: Left atrial size was normal in size. Right Atrium: Right atrial size was normal in size. Pericardium: There is no evidence of pericardial effusion. Mitral Valve: The mitral valve is normal in structure. Mild mitral valve regurgitation. No evidence of mitral valve stenosis. Tricuspid Valve: The tricuspid valve is normal in structure. Tricuspid valve regurgitation is trivial. No evidence of tricuspid stenosis. Aortic Valve: The aortic valve is tricuspid. Aortic valve regurgitation is not visualized. No aortic stenosis is present. Pulmonic Valve: The pulmonic valve was normal in structure. Pulmonic valve regurgitation is not visualized. No evidence of pulmonic stenosis. Aorta: The aortic root is normal in size and structure. IAS/Shunts: No atrial level shunt detected by color flow Doppler.  LEFT VENTRICLE PLAX 2D LVIDd:         4.90 cm     Diastology LVIDs:         3.50 cm     LV e' medial:    6.42 cm/s LV PW:         1.10 cm     LV E/e' medial:  13.7 LV IVS:        0.90 cm     LV e' lateral:   9.36 cm/s LVOT diam:     2.10 cm     LV E/e' lateral: 9.4 LV SV:         68 LV SV Index:   33 LVOT Area:     3.46 cm  LV Volumes (MOD) LV vol d, MOD A2C: 73.6 ml LV vol d, MOD A4C: 70.4 ml LV vol s, MOD A2C: 32.4 ml LV vol s, MOD A4C: 25.4 ml LV SV MOD A2C:     41.2 ml LV SV MOD A4C:     70.4 ml LV SV MOD BP:      46.6 ml RIGHT VENTRICLE             IVC RV S prime:  10.10 cm/s  IVC diam: 1.90 cm TAPSE (M-mode): 1.9 cm LEFT ATRIUM             Index        RIGHT ATRIUM           Index  LA diam:        2.80 cm 1.34 cm/m   RA Area:     12.10 cm LA Vol (A2C):   30.7 ml 14.70 ml/m  RA Volume:   22.20 ml  10.63 ml/m LA Vol (A4C):   35.5 ml 17.00 ml/m LA Biplane Vol: 33.5 ml 16.04 ml/m  AORTIC VALVE LVOT Vmax:   99.20 cm/s LVOT Vmean:  67.200 cm/s LVOT VTI:    0.196 m  AORTA Ao Root diam: 3.50 cm Ao Asc diam:  3.60 cm MITRAL VALVE MV Area (PHT): 4.10 cm     SHUNTS MV Decel Time: 185 msec     Systemic VTI:  0.20 m MV E velocity: 88.05 cm/s   Systemic Diam: 2.10 cm MV A velocity: 137.00 cm/s MV E/A ratio:  0.64 Aditya Sabharwal Electronically signed by Ria Commander Signature Date/Time: 10/07/2023/1:06:16 PM    Final    EEG adult Result Date: 10/07/2023 Matthews Elida HERO, MD     10/07/2023  7:28 AM Routine EEG Report JANUEL DOOLAN is a 78 y.o. male with a history of altered mental status who is undergoing an EEG to evaluate for seizures. Report: This EEG was acquired with electrodes placed according to the International 10-20 electrode system (including Fp1, Fp2, F3, F4, C3, C4, P3, P4, O1, O2, T3, T4, T5, T6, A1, A2, Fz, Cz, Pz). The following electrodes were missing or displaced: none. The occipital dominant rhythm was 8.5 Hz. This activity is reactive to stimulation. Drowsiness was manifested by background fragmentation; deeper stages of sleep were not identified. There was no focal slowing. There were no interictal epileptiform discharges. There were no electrographic seizures identified. There was no abnormal response to photic stimulation or hyperventilation. Impression: This EEG was obtained while awake and drowsy and is normal.   Clinical Correlation: Normal EEGs, however, do not rule out epilepsy. Elida Matthews, MD [facility] [phone] If 7pm- 7am, please page neurology on call as listed in AMION.   CT ABDOMEN PELVIS W CONTRAST Result Date: 10/06/2023 CLINICAL DATA:  Acute abdominal pain EXAM: CT ABDOMEN AND PELVIS WITH CONTRAST TECHNIQUE: Multidetector CT imaging  of the abdomen and pelvis was performed using the standard protocol following bolus administration of intravenous contrast. RADIATION DOSE REDUCTION: This exam was performed according to the departmental dose-optimization program which includes automated exposure control, adjustment of the mA and/or kV according to patient size and/or use of iterative reconstruction technique. CONTRAST:  75mL OMNIPAQUE  IOHEXOL  350 MG/ML SOLN COMPARISON:  07/12/2023 FINDINGS: Lower chest: Coronary and aortic atherosclerosis. Mild mitral valve calcification. Distal esophageal wall thickening is nonspecific although esophagitis would be a common cause. Emphysema noted. Scarring or atelectasis anteromedially in the right middle lobe. Hepatobiliary: Unremarkable Pancreas: Unremarkable Spleen: Unremarkable Adrenals/Urinary Tract: Contrast medium in the collecting systems in urinary bladder compatible with prior contrast injection for CT angiogram; this lowers sensitivity for nonobstructive renal calculi but no hydronephrosis or hydroureter is present. Stable renal cysts warrant no further imaging workup. Stomach/Bowel: Mild sigmoid colon diverticulosis. Vascular/Lymphatic: Heavy atheromatous vascular calcification of the abdominal aorta and its branches. Atheromatous plaque at the origins of the celiac trunk and SMA and scattered further distally in the SMA. No overt occlusion of either of these  vessels. Reproductive: Unremarkable Other: No supplemental non-categorized findings. Musculoskeletal: Compression fractures at T12, L3, L4, and L5. The T12 compression fracture demonstrates about 40% loss of vertebral height (formerly 25% on 07/12/2023) and increased sclerosis compatible with progressive collapse. Small supraumbilical hernia contains adipose tissue. IMPRESSION: 1. Progressive collapse of the T12 compression fracture, now with about 40% loss of vertebral height. 2. Distal esophageal wall thickening is nonspecific although esophagitis  would be a common cause. 3. Mild sigmoid colon diverticulosis. 4. Small supraumbilical hernia contains adipose tissue. 5. Aortic Atherosclerosis (ICD10-I70.0) and Emphysema (ICD10-J43.9). Systemic and coronary atherosclerosis. Mild mitral valve calcification. Electronically Signed   By: Ryan Salvage M.D.   On: 10/06/2023 14:28   CT ANGIO HEAD NECK W WO CM Result Date: 10/06/2023 CLINICAL DATA:  78 year old male with altered mental status, headache, neurologic deficit. EXAM: CT ANGIOGRAPHY HEAD AND NECK WITH AND WITHOUT CONTRAST TECHNIQUE: Multidetector CT imaging of the head and neck was performed using the standard protocol during bolus administration of intravenous contrast. Multiplanar CT image reconstructions and MIPs were obtained to evaluate the vascular anatomy. Carotid stenosis measurements (when applicable) are obtained utilizing NASCET criteria, using the distal internal carotid diameter as the denominator. RADIATION DOSE REDUCTION: This exam was performed according to the departmental dose-optimization program which includes automated exposure control, adjustment of the mA and/or kV according to patient size and/or use of iterative reconstruction technique. CONTRAST:  75mL OMNIPAQUE  IOHEXOL  350 MG/ML SOLN COMPARISON:  Head CT and brain MRI earlier today and last night. Previous ultrasound thyroid  imaging 06/20/2022 and earlier. FINDINGS: CTA NECK Skeleton: Absent maxillary dentition. Chronic lower cervical disc and endplate degeneration C4-C5, C5-C6. No acute osseous abnormality identified. Upper chest: Bilateral emphysema. Mild superimposed atelectasis. Negative visible superior mediastinum. Other neck: Nonvascular neck soft tissue spaces appear negative. Aortic arch: Advanced Calcified aortic atherosclerosis. Three vessel arch. Right carotid system: Brachiocephalic artery, right CCA origin soft and calcified plaque without stenosis. Dense right subclavian venous contrast streak artifact  obscures a portion of the proximal right CCA. Soft and calcified right CCA plaque before the bifurcation without stenosis. Relatively mild calcified plaque at the right carotid bifurcation. However, bulky 7-8 mm segment of calcified plaque at the distal bulb with RADIOGRAPHIC STRING SIGN STENOSIS (series 9, image 149 and series 7, image 107). The right ICA remains patent with no additional plaque to the skull base. Left carotid system: Left CCA origin plaque without stenosis. Left CCA tortuosity and additional soft and calcified plaque without stenosis before the bifurcation. Moderate calcified left ICA origin. Heavily calcified distal left ICA bulb with high-grade stenosis approaching a radiographic string sign on series 9, image 214. Stenosis here is numerically estimated at 74 % with respect to the distal vessel. Left ICA remains patent with mild ectasia between the stenosis in the skull base. Vertebral arteries: Proximal right subclavian artery calcified plaque without stenosis. Right vertebral artery origin adjacent calcified plaque with mild stenosis (series 8, image 130). Patent right vertebral artery to the skull base with no other plaque or stenosis in the neck. Proximal left subclavian artery calcified plaque without stenosis. Calcified plaque adjacent to the left vertebral artery origin with mild stenosis on series 8, image 130. Tortuous left V1 segment. Mildly dominant left vertebral artery is patent to the skull base with no other plaque or stenosis. CTA HEAD Posterior circulation: Heavily calcified distal vertebral arteries, dominant distal left vertebral artery stenosis is moderate to severe on series 5, image 152 just proximal to the patent left PICA origin. Moderate  non dominant right V4 segment calcified plaque and stenosis on image 156. Normal downstream right PICA origin. Additional moderate to severe stenosis of the right vertebral artery at the vertebrobasilar junction. Moderate contralateral  left vertebrobasilar junction stenosis also (series 8, image 162). Patent basilar artery with mild tortuosity and irregularity. No basilar stenosis. Normal SCA and PCA origins. Small posterior communicating arteries. Left PCA is patent with mild irregularity. Right PCA P2 segment demonstrates mild to moderate long segment irregularity and stenosis on series 10, image 18. Distal right PCA branches are patent. Anterior circulation: Both ICA siphons are patent with bulky siphon calcified plaque. On the left only mild siphon stenosis. On the right moderate stenosis of the proximal cavernous segment suspected on series 5, image 119. Patent carotid termini. Normal posterior communicating artery origins. Normal MCA and left ACA origins. Diminutive or absent right ACA A1 segment. Normal anterior communicating artery, tortuous proximal A2 segments. Bilateral ACA branches are within normal limits. Left MCA M1 segment and bifurcation are patent, mildly tortuous without stenosis. Right MCA M1 segment and trifurcation are patent and mildly tortuous without stenosis. Bilateral MCA branches are within normal limits. Venous sinuses: Early contrast timing, grossly patent. Anatomic variants: Dominant left vertebral artery. Left ACA A1 is dominant, the right is diminutive or absent. Review of the MIP images confirms the above findings IMPRESSION: 1. Advanced atherosclerosis from the aortic arch, throughout the neck, and at the skull base. No large vessel occlusion, but Positive for: - RICA bulb RADIOGRAPHIC STRING SIGN STENOSIS. - LICA bulb high-grade stenosis, estimated 74% and approaching a String sign. - bilateral distal Vertebral Artery and Vertebrobasilar junction Moderate to Severe stenoses (maximal left proximal V4 segment). - up to Moderate stenosis cavernous Right ICA siphon. 2.  Aortic Atherosclerosis (ICD10-I70.0). 3.  Emphysema (ICD10-J43.9). Electronically Signed   By: VEAR Hurst M.D.   On: 10/06/2023 07:22   DG Abd 1  View Result Date: 10/06/2023 EXAM: 1 VIEW XRAY OF THE ABDOMEN 10/06/2023 06:45:00 AM COMPARISON: CT 07/12/2023 CLINICAL HISTORY: Abdominal distension; Vomiting. Reason for exam: pt order states abdominal distention; vomiting ; Per ED triage notes: The pt has been confused intermittently for awhile yesterday he drove himself to [facility] and was seen there. Today the pt called the son and told him that he had taken a nap and when he woke up he felt bad and tried to call; his son and could not dial the phone. His son reported that the pt has not been his self for awhile pa saw here in triage FINDINGS: BOWEL: Gaseous distention of the stomach, mild. In the left lower quadrant of the abdomen there are a few air-filled loops of small bowel measuring up to 2.6 cm. Gas and stool noted within the colon up to the rectum. SOFT TISSUES: No opaque urinary calculi. Vascular calcifications. BONES: No acute osseous abnormality. Multilevel thoracolumbar degenerative changes. IMPRESSION: 1. Nonspecific bowel gas pattern with a few mildly distended air-filled loops of small bowel in the left lower quadrant of the abdomen. No signs of high-grade bowel obstruction. Electronically signed by: Waddell Calk MD 10/06/2023 06:56 AM EDT RP Workstation: [id number]   MR BRAIN WO CONTRAST Result Date: 10/06/2023 CLINICAL DATA:  Initial evaluation for acute neuro deficit, stroke suspected. EXAM: MRI HEAD WITHOUT CONTRAST TECHNIQUE: Multiplanar, multiecho pulse sequences of the brain and surrounding structures were obtained without intravenous contrast. COMPARISON:  CT from 10/05/2023 FINDINGS: Brain: Examination moderately degraded by motion artifact. Diffuse prominence of the CSF containing spaces compatible generalized cerebral atrophy.  Patchy and confluent T2/FLAIR hyperintensity involving the periventricular deep white matter both cerebral hemispheres as well as the pons, consistent with chronic small vessel ischemic disease, mild for  age. No evidence for acute or subacute infarct. Gray-white matter differentiation maintained. No is a chronic cortical infarction. No visible acute or chronic intracranial blood products. No mass lesion, midline shift or mass effect. No hydrocephalus or extra-axial fluid collection. Pituitary gland within normal limits for age Vascular: Major intracranial vascular flow voids are maintained. Skull and upper cervical spine: Craniocervical junction within normal limits. Bone marrow signal intensity normal. No scalp soft tissue abnormality. Sinuses/Orbits: Globes and orbital soft tissues within normal limits. Paranasal sinuses are largely clear. No significant mastoid effusion. Other: None. IMPRESSION: 1. No acute intracranial abnormality. 2. Generalized age-related cerebral atrophy with mild chronic small vessel ischemic disease. Electronically Signed   By: Morene Hoard M.D.   On: 10/06/2023 02:10   DG Chest Port 1 View Result Date: 10/06/2023 CLINICAL DATA:  Cough EXAM: PORTABLE CHEST 1 VIEW COMPARISON:  Film from 2 days previous FINDINGS: Cardiac shadow is stable. Aortic calcifications are noted. Elevation of the right hemidiaphragm is seen. No focal infiltrate or effusion is noted. No bony abnormality is seen. IMPRESSION: No acute abnormality noted. Electronically Signed   By: Oneil Devonshire M.D.   On: 10/06/2023 01:06   CT HEAD WO CONTRAST Result Date: 10/05/2023 EXAM: CT HEAD WITHOUT CONTRAST 10/05/2023 08:20:00 PM TECHNIQUE: CT of the head was performed without the administration of intravenous contrast. Automated exposure control, iterative reconstruction, and/or weight based adjustment of the mA/kV was utilized to reduce the radiation dose to as low as reasonably achievable. COMPARISON: None available. CLINICAL HISTORY: Neuro deficit, acute, stroke suspected. AMS, HA FINDINGS: BRAIN AND VENTRICLES: No acute hemorrhage. No evidence of acute infarct. No hydrocephalus. No extra-axial collection. No  mass effect or midline shift. Parenchymal volume loss is commensurate with the patient's age. Periventricular white matter changes are present likely reflecting the sequela of small vessel ischemia. Remote lacunar infarct within the anterior left internal capsule and left corona radiata. ORBITS: No acute abnormality. SINUSES: No acute abnormality. SOFT TISSUES AND SKULL: No acute soft tissue abnormality. No skull fracture. IMPRESSION: 1. No acute intracranial abnormality. 2. Parenchymal volume loss commensurate with the patient's age. 3. Periventricular white matter changes likely reflecting the sequela of small vessel ischemia. 4. Remote lacunar infarct within the anterior left internal capsule and left corona radiata. Electronically signed by: Dorethia Molt MD 10/05/2023 08:34 PM EDT RP Workstation: [id number]   DG Chest 2 View Result Date: 10/04/2023 CLINICAL DATA:  Pain EXAM: DG CHEST 2V COMPARISON:  August 09, 2022 FINDINGS: The heart size and mediastinal contours are within normal limits. Aorta is prominent and calcified. Increased lung volumes suggestive of COPD. Increased interstitial marking of both lung fields. No new pulmonary opacity or nodule. The visualized skeletal structures are unremarkable.No pleural effusion or pneumothorax. IMPRESSION: No active cardiopulmonary disease. Electronically Signed   By: Megan  Zare M.D.   On: 10/04/2023 11:44    Microbiology: Results for orders placed or performed during the hospital encounter of 10/05/23  Resp panel by RT-PCR (RSV, Flu A&B, Covid) Anterior Nasal Swab     Status: None   Collection Time: 10/06/23 12:47 AM   Specimen: Anterior Nasal Swab  Result Value Ref Range Status   SARS Coronavirus 2 by RT PCR NEGATIVE NEGATIVE Final   Influenza A by PCR NEGATIVE NEGATIVE Final   Influenza B by PCR NEGATIVE NEGATIVE Final  Comment: (NOTE) The Xpert Xpress SARS-CoV-2/FLU/RSV plus assay is intended as an aid in the diagnosis of influenza from  Nasopharyngeal swab specimens and should not be used as a sole basis for treatment. Nasal washings and aspirates are unacceptable for Xpert Xpress SARS-CoV-2/FLU/RSV testing.  Fact Sheet for Patients: BloggerCourse.com  Fact Sheet for Healthcare Providers: SeriousBroker.it  This test is not yet approved or cleared by the [place]  FDA and has been authorized for detection and/or diagnosis of SARS-CoV-2 by FDA under an Emergency Use Authorization (EUA). This EUA will remain in effect (meaning this test can be used) for the duration of the COVID-19 declaration under Section 564(b)(1) of the Act, 21 U.S.C. section 360bbb-3(b)(1), unless the authorization is terminated or revoked.     Resp Syncytial Virus by PCR NEGATIVE NEGATIVE Final    Comment: (NOTE) Fact Sheet for Patients: BloggerCourse.com  Fact Sheet for Healthcare Providers: SeriousBroker.it  This test is not yet approved or cleared by the [place]  FDA and has been authorized for detection and/or diagnosis of SARS-CoV-2 by FDA under an Emergency Use Authorization (EUA). This EUA will remain in effect (meaning this test can be used) for the duration of the COVID-19 declaration under Section 564(b)(1) of the Act, 21 U.S.C. section 360bbb-3(b)(1), unless the authorization is terminated or revoked.  Performed at [facility], [address], [place], [place] [address]     Labs: CBC: Recent Labs  Lab 10/04/23 1126 10/05/23 1951 10/05/23 2008 10/06/23 0511 10/07/23 0609  WBC 5.6  --  8.0 11.2* 8.4  NEUTROABS 3.1  --  5.8  --   --   HGB 14.0 14.6 14.2 14.3 14.5  HCT 40.0 43.0 41.5 42.2 42.5  MCV 94.1  --  95.6 95.5 96.6  PLT 183  --  222 215 179   Basic Metabolic Panel: Recent Labs  Lab 10/04/23 1126 10/05/23 1951 10/05/23 2008 10/06/23 0714 10/07/23 0609 10/08/23 0505  NA 139 139 140 137 142  134*  K 4.1 3.5 3.6 3.6 3.5 4.0  CL 103 104 104 102 108 102  CO2 22  --  20* 25 19* 20*  GLUCOSE 98 124* 124* 136* 108* 106*  BUN 17 23 21 22 11 10   CREATININE 1.20 1.20 1.15 1.05 0.88 0.94  CALCIUM  10.4*  --  9.4 9.0 9.8 9.1  MG  --   --   --  2.1  --  2.4  PHOS  --   --   --  3.9  --   --    Liver Function Tests: Recent Labs  Lab 10/05/23 2008  AST 31  ALT 23  ALKPHOS 75  BILITOT 1.0  PROT 7.3  ALBUMIN 4.6   CBG: Recent Labs  Lab 10/07/23 1732  GLUCAP 105*    Discharge time spent: 43 minutes.  Signed: Deliliah Room, MD [facility] 10/09/2023

## 2023-10-10 ENCOUNTER — Telehealth: Payer: Self-pay | Admitting: *Deleted

## 2023-10-10 NOTE — Transitions of Care (Post Inpatient/ED Visit) (Signed)
 10/10/2023  Name: Eric Cook MRN: [id number] DOB: 13-Mar-1945  Today's TOC FU Call Status: Today's TOC FU Call Status:: Successful TOC FU Call Completed TOC FU Call Complete Date: 10/10/23 Patient's Name and Date of Birth confirmed.  Transition Care Management Follow-up Telephone Call Date of Discharge: 10/09/23 Discharge Facility: [facility]) Type of Discharge: Inpatient Admission Primary Inpatient Discharge Diagnosis:: Stroke-like symptom How have you been since you were released from the hospital?:  (eating, drinking well, ambulating well, patient's son Eric Cook is temporarily living with pt, permission given to speak with son Eric Cook) Any questions or concerns?: No  Items Reviewed: Did you receive and understand the discharge instructions provided?: Yes Medications obtained,verified, and reconciled?: Yes (Medications Reviewed) Any new allergies since your discharge?: No Dietary orders reviewed?: Yes Type of Diet Ordered:: heart healthy Do you have support at home?: Yes People in Home [RPT]: child(ren), adult Name of Support/Comfort Primary Source: adult son temporarily staying with pt  Eric Cook)  Medications Reviewed Today: Medications Reviewed Today     Reviewed by Aura Mliss LABOR, RN (Registered Nurse) on 10/10/23 at 1045  Med List Status: <None>   Medication Order Taking? Sig Documenting Provider Last Dose Status Informant  acetaminophen  (TYLENOL ) 500 MG tablet [id number] Yes Take 500 mg by mouth as needed for moderate pain or mild pain (As Needed). [provider]  Active Self, Child, Pharmacy Records  aspirin  EC 81 MG tablet [id number] Yes Take 1 tablet (81 mg total) by mouth daily. Swallow whole. Rashid, Farhan, MD  Active   clopidogrel  (PLAVIX ) 75 MG tablet [id number] Yes Take 1 tablet (75 mg total) by mouth daily. Rashid, Farhan, MD  Active   furosemide  (LASIX ) 20 MG tablet [id number] Yes TAKE 1 TABLET (20 MG TOTAL) BY MOUTH DAILY AS NEEDED FOR FLUID.  Jolinda Norene HERO, DO  Active Self, Child, Pharmacy Records  levETIRAcetam  (KEPPRA  XR) 500 MG 24 hr tablet [id number] Yes Take 1 tablet (500 mg total) by mouth at bedtime. Whitfield Raisin, NP  Active Self, Child, Pharmacy Records  metoprolol  tartrate (LOPRESSOR ) 25 MG tablet [id number] Yes Take 1 tablet (25 mg total) by mouth 2 (two) times daily. Rashid, Farhan, MD  Active   pantoprazole  (PROTONIX ) 40 MG tablet [id number] Yes Take 1 tablet (40 mg total) by mouth daily. Rashid, Farhan, MD  Active   rosuvastatin  (CRESTOR ) 20 MG tablet [id number] Yes Take 1 tablet (20 mg total) by mouth daily. Dino Antu, MD  Active   sertraline  (ZOLOFT ) 50 MG tablet [id number] Yes TAKE 1 TABLET BY MOUTH EVERY DAY Hawks, Christy A, FNP  Active Self, Child, Pharmacy Records  tamsulosin  (FLOMAX ) 0.4 MG CAPS capsule [id number] Yes Take 1 capsule (0.4 mg total) by mouth daily after supper. Rashid, Farhan, MD  Active   traZODone  (DESYREL ) 50 MG tablet [id number] Yes TAKE 0.5-1 TABLETS BY MOUTH AT BEDTIME AS NEEDED FOR SLEEP. Jolinda Norene HERO, DO  Active Self, Child, Pharmacy Records            Home Care and Equipment/Supplies: Were Home Health Services Ordered?: Yes Name of Home Health Agency:: [facility] Has Agency set up a time to come to your home?: Yes First Home Health Visit Date: 10/11/23 Any new equipment or medical supplies ordered?: No  Functional Questionnaire: Do you need assistance with bathing/showering or dressing?: No Do you need assistance with meal preparation?: No Do you need assistance with eating?: No Do you have difficulty maintaining continence: No Do you need assistance with getting out of bed/getting out  of a chair/moving?: No Do you have difficulty managing or taking your medications?: No  Follow up appointments reviewed: PCP Follow-up appointment confirmed?: Yes (collaborated with care guide and scheduled post hospital follow up appointment) Date of PCP follow-up appointment?:  10/16/23 Follow-up Provider: Rock Bruns NP Specialist Hospital Follow-up appointment confirmed?: No Reason Specialist Follow-Up Not Confirmed: Patient has Specialist Provider Number and will Call for Appointment Do you need transportation to your follow-up appointment?: No Do you understand care options if your condition(s) worsen?: Yes-patient verbalized understanding  SDOH Interventions Today    Flowsheet Row Most Recent Value  SDOH Interventions   Food Insecurity Interventions Intervention Not Indicated  Housing Interventions Intervention Not Indicated  Transportation Interventions Intervention Not Indicated  Utilities Interventions Intervention Not Indicated    Goals Addressed             This Visit's Progress    VBCI Transitions of Care (TOC) Care Plan       Problems:  Recent Hospitalization for treatment of Stroke like symptoms/ TIA Patient's son Eric Cook is temporarily staying with pt, pt gives permission for RN CM to speak with son Eric Cook Life Alert necklace has been ordered Patient reports he is feeling well, no concerns reported, home health PT is working with pt  Goal:  Over the next 30 days, the patient will not experience hospital readmission  Interventions:  Stroke: Reviewed Importance of taking all medications as prescribed Reviewed Importance of attending all scheduled provider appointments Screening for signs and symptoms of depression related to chronic disease state Assessed social determinant of health barriers Assessed for signs and symptoms of stroke Reviewed the importance of exercise Assessed for fall status and safety in the home Reviewed signs /symptoms TIA/ CVA, importance of seeking emergency care immediately Reviewed safety precautions Collaborated with care guide and scheduled post hospital follow up for 10/16/23 @ 1035  Patient Self Care Activities:  Attend all scheduled provider appointments Attend church or other social activities Call  pharmacy for medication refills 3-7 days in advance of running out of medications Call provider office for new concerns or questions  Notify RN Care Manager of TOC call rescheduling needs Participate in Transition of Care Program/Attend TOC scheduled calls Take medications as prescribed   See emergency care immediately if you have any signs/ symptoms of stroke See primary care provider on 10/16/23 @ 1035 am  Plan:  Telephone follow up appointment with care management team member scheduled for:  10/17/23 @ 930 am The patient has been provided with contact information for the care management team and has been advised to call with any health related questions or concerns.          Mliss Creed [facility], BSN RN Care Manager/ Transition of Care [facility] [phone]

## 2023-10-11 ENCOUNTER — Encounter (HOSPITAL_COMMUNITY): Payer: Self-pay | Admitting: Vascular Surgery

## 2023-10-11 ENCOUNTER — Other Ambulatory Visit: Payer: Self-pay

## 2023-10-11 DIAGNOSIS — I5032 Chronic diastolic (congestive) heart failure: Secondary | ICD-10-CM | POA: Diagnosis not present

## 2023-10-11 DIAGNOSIS — J439 Emphysema, unspecified: Secondary | ICD-10-CM | POA: Diagnosis not present

## 2023-10-11 DIAGNOSIS — I890 Lymphedema, not elsewhere classified: Secondary | ICD-10-CM | POA: Diagnosis not present

## 2023-10-11 DIAGNOSIS — I700 Atherosclerosis of aorta: Secondary | ICD-10-CM | POA: Diagnosis not present

## 2023-10-11 DIAGNOSIS — I6522 Occlusion and stenosis of left carotid artery: Secondary | ICD-10-CM | POA: Diagnosis not present

## 2023-10-11 DIAGNOSIS — I2510 Atherosclerotic heart disease of native coronary artery without angina pectoris: Secondary | ICD-10-CM | POA: Diagnosis not present

## 2023-10-11 DIAGNOSIS — I6521 Occlusion and stenosis of right carotid artery: Secondary | ICD-10-CM | POA: Diagnosis not present

## 2023-10-11 DIAGNOSIS — I3481 Nonrheumatic mitral (valve) annulus calcification: Secondary | ICD-10-CM | POA: Diagnosis not present

## 2023-10-11 NOTE — Progress Notes (Signed)
 Anesthesia Chart Review: Same day workup  78 yo male with pertinent hx including former smoker (54 pack/yr quit 2021), complex partial seizures (stable on Keppra  XR 500 mg daily), HFpEF, essential hypertension, GERD on PPI, BPH, chronic lymphedema.   Recent admission 9/13-9/17/25 for TIA with severe left ICA stenosis (string sign on CTA; LICA with high grade stenosis esimated at 74%). Echo with EF 55-60%, grade 1 dd, normal RV, mild MR. EEG with no evidence of seizures. Vascular surgery recommended outpatient TCAR. He was discharged on DAPT with ASA and Plavix .  BMP 10/08/23 reivewed, mild hypoNA sodium 134, otherwise unremarkable.   CBC 10/07/23 reviewed, WNL.  Pt will need DOS eval.  EKG 10/08/23: Sinus rhythm with Premature supraventricular complexes. Rate 82. Left anterior fascicular block  TTE 10/07/23: 1. Left ventricular ejection fraction, by estimation, is 55 to 60%. The  left ventricle has normal function. The left ventricle has no regional  wall motion abnormalities. Left ventricular diastolic parameters are  consistent with Grade I diastolic  dysfunction (impaired relaxation).   2. Right ventricular systolic function is normal. The right ventricular  size is normal. Tricuspid regurgitation signal is inadequate for assessing  PA pressure.   3. The mitral valve is normal in structure. Mild mitral valve  regurgitation. No evidence of mitral stenosis.   4. The aortic valve is tricuspid. Aortic valve regurgitation is not  visualized. No aortic stenosis is present.   Nuclear stress 09/26/22: Normal perfusion images. The study is overall low risk. Note PVCs in bigeminy at peak stress.   No ST deviation was noted. Arrhythmias during stress: frequent PVCs in bigeminy at peak stress. Arrhythmias during recovery: occasional PACs, occasional PVCs.   LV perfusion is normal. There is no evidence of ischemia. There is no evidence of infarction.   Left ventricular function is normal. Nuclear  stress EF: 66%. The left ventricular ejection fraction is hyperdynamic (>65%). End diastolic cavity size is normal. End systolic cavity size is normal. No evidence of transient ischemic dilation (TID) noted.   Prior study not available for comparison.    Lynwood Geofm RIGGERS [facility] Stay [facility]/Anesthesiology Phone [phone] 10/11/2023 1:57 PM

## 2023-10-11 NOTE — Progress Notes (Signed)
 SDW CALL  Patient was given pre-op instructions over the phone. The opportunity was given for the patient to ask questions. No further questions asked. Patient verbalized understanding of instructions given.   PCP - Jolinda Norene HERO, DO  Cardiologist - O'Neal, Darryle Ned, MD   PPM/ICD - denies Device Orders - n/a Rep Notified - n/a  Chest x-ray - 10-07-23 EKG - 10-08-23 Stress Test - 09-26-22 ECHO - 10-07-23 Cardiac Cath -   Sleep Study - denies CPAP - n/a  Dm denies   Blood Thinner Instructions:plavix  continue Aspirin  Instructions:continue  ERAS Protcol -NPO   COVID TEST-    Anesthesia review: yes  Patient denies shortness of breath, fever, cough and chest pain over the phone call   All instructions explained to the patient, with a verbal understanding of the material. Patient agrees to go over the instructions while at home for a better understanding.

## 2023-10-11 NOTE — Anesthesia Preprocedure Evaluation (Addendum)
 Anesthesia Evaluation  Patient identified by MRN, date of birth, ID band Patient awake    Reviewed: Allergy & Precautions, NPO status , Patient's Chart, lab work & pertinent test results, reviewed documented beta blocker date and time   History of Anesthesia Complications Negative for: history of anesthetic complications  Airway Mallampati: III  TM Distance: >3 FB Neck ROM: Full    Dental  (+) Dental Advisory Given, Edentulous Upper, Poor Dentition, Missing,    Pulmonary COPD, neg recent URI, former smoker   breath sounds clear to auscultation       Cardiovascular hypertension, +CHF  (-) Past MI and (-) Cardiac Stents  Rhythm:Regular Rate:Normal     Neuro/Psych Seizures -,  PSYCHIATRIC DISORDERS Anxiety     TIA   GI/Hepatic PUD,GERD  Poorly Controlled,,  Endo/Other  neg diabetes    Renal/GU Renal disease     Musculoskeletal   Abdominal   Peds  Hematology   Anesthesia Other Findings Alert and oriented to person, place and time  Reproductive/Obstetrics                              Anesthesia Physical Anesthesia Plan  ASA: 3  Anesthesia Plan: General   Post-op Pain Management:    Induction: Intravenous and Rapid sequence  PONV Risk Score and Plan: 1 and Ondansetron   Airway Management Planned: Oral ETT  Additional Equipment: Arterial line  Intra-op Plan:   Post-operative Plan: Extubation in OR  Informed Consent:      Dental advisory given  Plan Discussed with:   Anesthesia Plan Comments: (Anesthesia Hx: None to review  TTE: Normal biventricular function, mild MR Carotid U/S: Moderate R ICA stenosis, Severe L ICA stenosis  78 year old male with PMH of COPD, former smoker, peptic ulcer disease, GERD, HTN, HLD, chronic back pain, seizures (on Keppra ), and recent confusion and vision changes secondary to bilateral ICA stenosis - who presents for L TCAR. Labs reviewed - Hgb  14.5, Plts 179, Cr 0.94, Glu 109. Type and Screen ordered for Pre-op. Plan for GETA, PIV x 2, arterial line.  )         Anesthesia Quick Evaluation

## 2023-10-14 ENCOUNTER — Inpatient Hospital Stay (HOSPITAL_COMMUNITY): Payer: Self-pay | Admitting: Physician Assistant

## 2023-10-14 ENCOUNTER — Other Ambulatory Visit: Payer: Self-pay

## 2023-10-14 ENCOUNTER — Encounter (HOSPITAL_COMMUNITY): Payer: Self-pay | Admitting: Vascular Surgery

## 2023-10-14 ENCOUNTER — Inpatient Hospital Stay (HOSPITAL_COMMUNITY)
Admission: RE | Admit: 2023-10-14 | Discharge: 2023-10-15 | DRG: 035 | Disposition: A | Discharge status: Home / Self Care | Attending: Vascular Surgery | Admitting: Vascular Surgery

## 2023-10-14 ENCOUNTER — Encounter (HOSPITAL_COMMUNITY)
Admission: RE | Disposition: A | Discharge status: Home / Self Care | Payer: Self-pay | Source: Home / Self Care | Attending: Vascular Surgery

## 2023-10-14 ENCOUNTER — Inpatient Hospital Stay (HOSPITAL_COMMUNITY)

## 2023-10-14 DIAGNOSIS — Z87891 Personal history of nicotine dependence: Secondary | ICD-10-CM

## 2023-10-14 DIAGNOSIS — Z791 Long term (current) use of non-steroidal anti-inflammatories (NSAID): Secondary | ICD-10-CM

## 2023-10-14 DIAGNOSIS — Z800 Family history of malignant neoplasm of digestive organs: Secondary | ICD-10-CM | POA: Diagnosis not present

## 2023-10-14 DIAGNOSIS — I5032 Chronic diastolic (congestive) heart failure: Secondary | ICD-10-CM | POA: Diagnosis present

## 2023-10-14 DIAGNOSIS — J449 Chronic obstructive pulmonary disease, unspecified: Secondary | ICD-10-CM | POA: Diagnosis not present

## 2023-10-14 DIAGNOSIS — I6522 Occlusion and stenosis of left carotid artery: Secondary | ICD-10-CM | POA: Diagnosis not present

## 2023-10-14 DIAGNOSIS — I110 Hypertensive heart disease with heart failure: Secondary | ICD-10-CM | POA: Diagnosis not present

## 2023-10-14 DIAGNOSIS — Z83719 Family history of colon polyps, unspecified: Secondary | ICD-10-CM

## 2023-10-14 DIAGNOSIS — F411 Generalized anxiety disorder: Secondary | ICD-10-CM | POA: Diagnosis present

## 2023-10-14 DIAGNOSIS — Z604 Social exclusion and rejection: Secondary | ICD-10-CM | POA: Diagnosis not present

## 2023-10-14 DIAGNOSIS — M545 Low back pain, unspecified: Secondary | ICD-10-CM | POA: Diagnosis present

## 2023-10-14 DIAGNOSIS — Z8711 Personal history of peptic ulcer disease: Secondary | ICD-10-CM | POA: Diagnosis not present

## 2023-10-14 DIAGNOSIS — G459 Transient cerebral ischemic attack, unspecified: Principal | ICD-10-CM

## 2023-10-14 DIAGNOSIS — G8929 Other chronic pain: Secondary | ICD-10-CM | POA: Diagnosis present

## 2023-10-14 DIAGNOSIS — Z8673 Personal history of transient ischemic attack (TIA), and cerebral infarction without residual deficits: Secondary | ICD-10-CM | POA: Diagnosis not present

## 2023-10-14 DIAGNOSIS — I6529 Occlusion and stenosis of unspecified carotid artery: Secondary | ICD-10-CM | POA: Diagnosis present

## 2023-10-14 DIAGNOSIS — Z79899 Other long term (current) drug therapy: Secondary | ICD-10-CM

## 2023-10-14 DIAGNOSIS — E785 Hyperlipidemia, unspecified: Secondary | ICD-10-CM | POA: Diagnosis present

## 2023-10-14 DIAGNOSIS — Z48812 Encounter for surgical aftercare following surgery on the circulatory system: Secondary | ICD-10-CM

## 2023-10-14 DIAGNOSIS — I10 Essential (primary) hypertension: Secondary | ICD-10-CM | POA: Diagnosis present

## 2023-10-14 DIAGNOSIS — F419 Anxiety disorder, unspecified: Secondary | ICD-10-CM | POA: Diagnosis present

## 2023-10-14 DIAGNOSIS — I5033 Acute on chronic diastolic (congestive) heart failure: Secondary | ICD-10-CM

## 2023-10-14 HISTORY — PX: TRANSCAROTID ARTERY REVASCULARIZATIONÂ: SHX6778

## 2023-10-14 HISTORY — PX: ULTRASOUND GUIDANCE FOR VASCULAR ACCESS: SHX6516

## 2023-10-14 HISTORY — DX: Chronic obstructive pulmonary disease, unspecified: J44.9

## 2023-10-14 HISTORY — DX: Cerebral infarction, unspecified: I63.9

## 2023-10-14 LAB — ABO/RH: ABO/RH(D): O POS

## 2023-10-14 LAB — CBC
HCT: 37.4 % — ABNORMAL LOW (ref 39.0–52.0)
Hemoglobin: 13.2 g/dL (ref 13.0–17.0)
MCH: 32.7 pg (ref 26.0–34.0)
MCHC: 35.3 g/dL (ref 30.0–36.0)
MCV: 92.6 fL (ref 80.0–100.0)
Platelets: 221 K/uL (ref 150–400)
RBC: 4.04 MIL/uL — ABNORMAL LOW (ref 4.22–5.81)
RDW: 12.5 % (ref 11.5–15.5)
WBC: 7.8 K/uL (ref 4.0–10.5)
nRBC: 0 % (ref 0.0–0.2)

## 2023-10-14 LAB — POCT ACTIVATED CLOTTING TIME
Activated Clotting Time: 227 s
Activated Clotting Time: 250 s

## 2023-10-14 LAB — TYPE AND SCREEN
ABO/RH(D): O POS
Antibody Screen: NEGATIVE

## 2023-10-14 LAB — SURGICAL PCR SCREEN
MRSA, PCR: NEGATIVE
Staphylococcus aureus: NEGATIVE

## 2023-10-14 LAB — CREATININE, SERUM
Creatinine, Ser: 0.89 mg/dL (ref 0.61–1.24)
GFR, Estimated: >60 mL/min (ref >60)

## 2023-10-14 SURGERY — TRANSCAROTID ARTERY REVASCULARIZATION (TCAR)
Anesthesia: General | Site: Neck | Laterality: Left

## 2023-10-14 MED ORDER — HEPARIN SODIUM (PORCINE) 1000 UNIT/ML IJ SOLN
INTRAMUSCULAR | Status: DC | PRN
Start: 2023-10-14 — End: 2023-10-14
  Administered 2023-10-14: 9000 [IU] via INTRAVENOUS
  Administered 2023-10-14: 3000 [IU] via INTRAVENOUS

## 2023-10-14 MED ORDER — HYDROMORPHONE HCL 1 MG/ML IJ SOLN: 0.5000 mg | INTRAMUSCULAR | Status: DC | PRN

## 2023-10-14 MED ORDER — SODIUM CHLORIDE 0.9 % IV SOLN
0.0125 ug/kg/min | INTRAVENOUS | Status: AC
  Administered 2023-10-14: .1 ug/kg/min via INTRAVENOUS
  Filled 2023-10-14: qty 2000

## 2023-10-14 MED ORDER — DOCUSATE SODIUM 100 MG PO CAPS
100.0000 mg | ORAL_CAPSULE | Freq: Every day | ORAL | Status: DC
  Administered 2023-10-15: 100 mg via ORAL
  Filled 2023-10-14: qty 1

## 2023-10-14 MED ORDER — FENTANYL CITRATE (PF) 250 MCG/5ML IJ SOLN
INTRAMUSCULAR | Status: DC | PRN
  Administered 2023-10-14 (×2): 50 ug via INTRAVENOUS

## 2023-10-14 MED ORDER — ROSUVASTATIN CALCIUM 20 MG PO TABS
20.0000 mg | ORAL_TABLET | Freq: Every day | ORAL | Status: DC
  Administered 2023-10-14 – 2023-10-15 (×2): 20 mg via ORAL
  Filled 2023-10-14 (×2): qty 1

## 2023-10-14 MED ORDER — ACETAMINOPHEN 650 MG RE SUPP: 325.0000 mg | RECTAL | Status: DC | PRN

## 2023-10-14 MED ORDER — ASPIRIN 81 MG PO TBEC
81.0000 mg | DELAYED_RELEASE_TABLET | Freq: Every day | ORAL | Status: DC
  Administered 2023-10-15: 81 mg via ORAL
  Filled 2023-10-14: qty 1

## 2023-10-14 MED ORDER — PHENOL 1.4 % MT LIQD: 1.0000 | OROMUCOSAL | Status: DC | PRN

## 2023-10-14 MED ORDER — LACTATED RINGERS IV SOLN
INTRAVENOUS | Status: DC
Start: 2023-10-14 — End: 2023-10-14

## 2023-10-14 MED ORDER — IODIXANOL 320 MG/ML IV SOLN
INTRAVENOUS | Status: DC | PRN
  Administered 2023-10-14: 24 mL via INTRA_ARTERIAL

## 2023-10-14 MED ORDER — OXYCODONE HCL 5 MG/5ML PO SOLN: 5.0000 mg | Freq: Once | ORAL | Status: DC | PRN

## 2023-10-14 MED ORDER — EPHEDRINE SULFATE-NACL 50-0.9 MG/10ML-% IV SOSY
PREFILLED_SYRINGE | INTRAVENOUS | Status: DC | PRN
  Administered 2023-10-14 (×3): 5 mg via INTRAVENOUS

## 2023-10-14 MED ORDER — CHLORHEXIDINE GLUCONATE 0.12 % MT SOLN
OROMUCOSAL | Status: AC
  Administered 2023-10-14: 15 mL via OROMUCOSAL
  Filled 2023-10-14: qty 15

## 2023-10-14 MED ORDER — OXYCODONE HCL 5 MG PO TABS: 5.0000 mg | ORAL_TABLET | ORAL | Status: DC | PRN

## 2023-10-14 MED ORDER — ROCURONIUM BROMIDE 10 MG/ML (PF) SYRINGE
PREFILLED_SYRINGE | INTRAVENOUS | Status: DC | PRN
  Administered 2023-10-14: 60 mg via INTRAVENOUS
  Administered 2023-10-14: 10 mg via INTRAVENOUS

## 2023-10-14 MED ORDER — TAMSULOSIN HCL 0.4 MG PO CAPS
0.4000 mg | ORAL_CAPSULE | Freq: Every day | ORAL | Status: DC
  Administered 2023-10-14: 0.4 mg via ORAL
  Filled 2023-10-14: qty 1

## 2023-10-14 MED ORDER — PHENYLEPHRINE HCL-NACL 20-0.9 MG/250ML-% IV SOLN
INTRAVENOUS | Status: DC | PRN
  Administered 2023-10-14: 50 ug/min via INTRAVENOUS

## 2023-10-14 MED ORDER — ONDANSETRON HCL 4 MG/2ML IJ SOLN: 4.0000 mg | Freq: Four times a day (QID) | INTRAMUSCULAR | Status: DC | PRN

## 2023-10-14 MED ORDER — HEPARIN 6000 UNIT IRRIGATION SOLUTION
Status: AC
  Filled 2023-10-14: qty 500

## 2023-10-14 MED ORDER — 0.9 % SODIUM CHLORIDE (POUR BTL) OPTIME
TOPICAL | Status: DC | PRN
  Administered 2023-10-14: 1000 mL

## 2023-10-14 MED ORDER — PROPOFOL 10 MG/ML IV BOLUS
INTRAVENOUS | Status: AC
Start: 2023-10-14 — End: 2023-10-14
  Filled 2023-10-14: qty 20

## 2023-10-14 MED ORDER — HEMOSTATIC AGENTS (NO CHARGE) OPTIME
TOPICAL | Status: DC | PRN
  Administered 2023-10-14: 1 via TOPICAL

## 2023-10-14 MED ORDER — POLYETHYLENE GLYCOL 3350 17 G PO PACK: 17.0000 g | PACK | Freq: Every day | ORAL | Status: DC | PRN

## 2023-10-14 MED ORDER — SODIUM CHLORIDE 0.9 % IV SOLN: INTRAVENOUS | Status: DC

## 2023-10-14 MED ORDER — CLOPIDOGREL BISULFATE 75 MG PO TABS
75.0000 mg | ORAL_TABLET | Freq: Every day | ORAL | Status: DC
  Administered 2023-10-15: 75 mg via ORAL
  Filled 2023-10-14: qty 1

## 2023-10-14 MED ORDER — LABETALOL HCL 5 MG/ML IV SOLN: 10.0000 mg | INTRAVENOUS | Status: DC | PRN

## 2023-10-14 MED ORDER — CHLORHEXIDINE GLUCONATE CLOTH 2 % EX PADS
6.0000 | MEDICATED_PAD | Freq: Once | CUTANEOUS | Status: AC
  Administered 2023-10-14: 6 via TOPICAL

## 2023-10-14 MED ORDER — CLEVIDIPINE BUTYRATE 0.5 MG/ML IV EMUL
INTRAVENOUS | Status: DC | PRN
  Administered 2023-10-14: 1 mg/h via INTRAVENOUS

## 2023-10-14 MED ORDER — CHLORHEXIDINE GLUCONATE CLOTH 2 % EX PADS: 6.0000 | MEDICATED_PAD | Freq: Once | CUTANEOUS | Status: DC

## 2023-10-14 MED ORDER — CEFAZOLIN SODIUM-DEXTROSE 2-4 GM/100ML-% IV SOLN
INTRAVENOUS | Status: AC
  Filled 2023-10-14: qty 100

## 2023-10-14 MED ORDER — ACETAMINOPHEN 10 MG/ML IV SOLN: 1000.0000 mg | Freq: Once | INTRAVENOUS | Status: DC | PRN

## 2023-10-14 MED ORDER — LEVETIRACETAM ER 500 MG PO TB24
500.0000 mg | ORAL_TABLET | Freq: Every day | ORAL | Status: DC
  Administered 2023-10-14: 500 mg via ORAL
  Filled 2023-10-14: qty 1

## 2023-10-14 MED ORDER — ONDANSETRON HCL 4 MG/2ML IJ SOLN: 4.0000 mg | Freq: Once | INTRAMUSCULAR | Status: DC | PRN

## 2023-10-14 MED ORDER — BISACODYL 5 MG PO TBEC: 5.0000 mg | DELAYED_RELEASE_TABLET | Freq: Every day | ORAL | Status: DC | PRN

## 2023-10-14 MED ORDER — HEPARIN SODIUM (PORCINE) 5000 UNIT/ML IJ SOLN
5000.0000 [IU] | Freq: Three times a day (TID) | INTRAMUSCULAR | Status: DC
  Administered 2023-10-15: 5000 [IU] via SUBCUTANEOUS
  Filled 2023-10-14: qty 1

## 2023-10-14 MED ORDER — HYDRALAZINE HCL 20 MG/ML IJ SOLN
5.0000 mg | INTRAMUSCULAR | Status: DC | PRN
  Administered 2023-10-14: 5 mg via INTRAVENOUS
  Filled 2023-10-14: qty 1

## 2023-10-14 MED ORDER — FENTANYL CITRATE (PF) 100 MCG/2ML IJ SOLN
25.0000 ug | INTRAMUSCULAR | Status: DC | PRN
  Administered 2023-10-14: 25 ug via INTRAVENOUS

## 2023-10-14 MED ORDER — OXYCODONE HCL 5 MG PO TABS: 5.0000 mg | ORAL_TABLET | Freq: Once | ORAL | Status: DC | PRN

## 2023-10-14 MED ORDER — CHLORHEXIDINE GLUCONATE 0.12 % MT SOLN: 15.0000 mL | Freq: Once | OROMUCOSAL | Status: AC

## 2023-10-14 MED ORDER — FENTANYL CITRATE (PF) 250 MCG/5ML IJ SOLN
INTRAMUSCULAR | Status: AC
  Filled 2023-10-14: qty 5

## 2023-10-14 MED ORDER — PROTAMINE SULFATE 10 MG/ML IV SOLN
INTRAVENOUS | Status: DC | PRN
  Administered 2023-10-14 (×2): 10 mg via INTRAVENOUS
  Administered 2023-10-14: 30 mg via INTRAVENOUS

## 2023-10-14 MED ORDER — METOPROLOL TARTRATE 25 MG PO TABS
25.0000 mg | ORAL_TABLET | Freq: Two times a day (BID) | ORAL | Status: DC
Start: 2023-10-14 — End: 2023-10-15
  Administered 2023-10-14 – 2023-10-15 (×2): 25 mg via ORAL
  Filled 2023-10-14 (×2): qty 1

## 2023-10-14 MED ORDER — ACETAMINOPHEN 325 MG PO TABS
325.0000 mg | ORAL_TABLET | ORAL | Status: DC | PRN
  Administered 2023-10-14: 650 mg via ORAL
  Filled 2023-10-14: qty 2

## 2023-10-14 MED ORDER — CEFAZOLIN SODIUM-DEXTROSE 2-4 GM/100ML-% IV SOLN
2.0000 g | INTRAVENOUS | Status: AC
  Administered 2023-10-14: 2 g via INTRAVENOUS

## 2023-10-14 MED ORDER — PROPOFOL 10 MG/ML IV BOLUS
INTRAVENOUS | Status: DC | PRN
  Administered 2023-10-14: 170 mg via INTRAVENOUS

## 2023-10-14 MED ORDER — POTASSIUM CHLORIDE CRYS ER 20 MEQ PO TBCR: 40.0000 meq | EXTENDED_RELEASE_TABLET | Freq: Every day | ORAL | Status: DC | PRN

## 2023-10-14 MED ORDER — METOPROLOL TARTRATE 5 MG/5ML IV SOLN: 2.5000 mg | INTRAVENOUS | Status: DC | PRN

## 2023-10-14 MED ORDER — ORAL CARE MOUTH RINSE: 15.0000 mL | Freq: Once | OROMUCOSAL | Status: AC

## 2023-10-14 MED ORDER — SUGAMMADEX SODIUM 200 MG/2ML IV SOLN
INTRAVENOUS | Status: DC | PRN
  Administered 2023-10-14: 362.8 mg via INTRAVENOUS

## 2023-10-14 MED ORDER — HEPARIN 6000 UNIT IRRIGATION SOLUTION
Status: DC | PRN
  Administered 2023-10-14: 1

## 2023-10-14 MED ORDER — DEXAMETHASONE SODIUM PHOSPHATE 10 MG/ML IJ SOLN
INTRAMUSCULAR | Status: DC | PRN
  Administered 2023-10-14: 5 mg via INTRAVENOUS

## 2023-10-14 MED ORDER — SERTRALINE HCL 50 MG PO TABS
50.0000 mg | ORAL_TABLET | Freq: Every day | ORAL | Status: DC
  Administered 2023-10-14 – 2023-10-15 (×2): 50 mg via ORAL
  Filled 2023-10-14 (×2): qty 1

## 2023-10-14 MED ORDER — TRAZODONE HCL 50 MG PO TABS: 50.0000 mg | ORAL_TABLET | Freq: Every evening | ORAL | Status: DC | PRN

## 2023-10-14 MED ORDER — DROPERIDOL 2.5 MG/ML IJ SOLN: 0.6250 mg | Freq: Once | INTRAMUSCULAR | Status: DC | PRN

## 2023-10-14 MED ORDER — ONDANSETRON HCL 4 MG/2ML IJ SOLN
INTRAMUSCULAR | Status: DC | PRN
  Administered 2023-10-14: 4 mg via INTRAVENOUS

## 2023-10-14 MED ORDER — LIDOCAINE 2% (20 MG/ML) 5 ML SYRINGE
INTRAMUSCULAR | Status: DC | PRN
  Administered 2023-10-14: 100 mg via INTRAVENOUS

## 2023-10-14 MED ORDER — GLYCOPYRROLATE PF 0.2 MG/ML IJ SOSY
PREFILLED_SYRINGE | INTRAMUSCULAR | Status: DC | PRN
Start: 2023-10-14 — End: 2023-10-14
  Administered 2023-10-14: .2 mg via INTRAVENOUS

## 2023-10-14 MED ORDER — PHENYLEPHRINE 80 MCG/ML (10ML) SYRINGE FOR IV PUSH (FOR BLOOD PRESSURE SUPPORT)
PREFILLED_SYRINGE | INTRAVENOUS | Status: DC | PRN
  Administered 2023-10-14: 160 ug via INTRAVENOUS
  Administered 2023-10-14 (×4): 80 ug via INTRAVENOUS

## 2023-10-14 MED ORDER — SODIUM CHLORIDE 0.9 % IV SOLN: 500.0000 mL | Freq: Once | INTRAVENOUS | Status: DC | PRN

## 2023-10-14 MED ORDER — CEFAZOLIN SODIUM-DEXTROSE 2-4 GM/100ML-% IV SOLN
2.0000 g | Freq: Three times a day (TID) | INTRAVENOUS | Status: AC
  Administered 2023-10-14 – 2023-10-15 (×2): 2 g via INTRAVENOUS
  Filled 2023-10-14 (×2): qty 100

## 2023-10-14 MED ORDER — FENTANYL CITRATE (PF) 100 MCG/2ML IJ SOLN
INTRAMUSCULAR | Status: AC
  Filled 2023-10-14: qty 2

## 2023-10-14 SURGICAL SUPPLY — 37 items
BAG BANDED W/RUBBER/TAPE 36X54 (MISCELLANEOUS) ×1 IMPLANT
BAG COUNTER SPONGE SURGICOUNT (BAG) ×1 IMPLANT
BALLOON STERLING RX 7X30X80 (BALLOONS) IMPLANT
CANISTER SUCTION 3000ML PPV (SUCTIONS) ×1 IMPLANT
CATH BALLN ENROUTE 6X35 (CATHETERS) IMPLANT
CLIP TI MEDIUM 6 (CLIP) ×2 IMPLANT
CLIP TI WIDE RED SMALL 6 (CLIP) ×2 IMPLANT
COVER DOME SNAP 22 D (MISCELLANEOUS) ×1 IMPLANT
COVER PROBE W GEL 5X96 (DRAPES) ×1 IMPLANT
DERMABOND ADVANCED .7 DNX12 (GAUZE/BANDAGES/DRESSINGS) ×1 IMPLANT
DRAPE FEMORAL ANGIO 80X135IN (DRAPES) ×1 IMPLANT
ELECTRODE REM PT RTRN 9FT ADLT (ELECTROSURGICAL) ×1 IMPLANT
GOWN STRL REUS W/ TWL LRG LVL3 (GOWN DISPOSABLE) ×2 IMPLANT
GOWN STRL REUS W/TWL 2XL LVL3 (GOWN DISPOSABLE) ×2 IMPLANT
GUIDEWIRE ENROUTE 0.014 (WIRE) ×1 IMPLANT
HEMOSTAT SNOW SURGICEL 2X4 (HEMOSTASIS) IMPLANT
KIT BASIN OR (CUSTOM PROCEDURE TRAY) ×1 IMPLANT
KIT ENCORE 26 ADVANTAGE (KITS) ×1 IMPLANT
KIT INTRODUCER GALT 7 (INTRODUCER) ×1 IMPLANT
KIT TURNOVER KIT B (KITS) ×1 IMPLANT
PACK CAROTID (CUSTOM PROCEDURE TRAY) ×1 IMPLANT
POSITIONER HEAD DONUT 9IN (MISCELLANEOUS) ×1 IMPLANT
SET MICROPUNCTURE 5F STIFF (MISCELLANEOUS) ×1 IMPLANT
STATION PROTECTION PRESSURIZED (MISCELLANEOUS) ×1 IMPLANT
STENT TRANSCAROTID SYS 10X30 (Permanent Stent) IMPLANT
STENT TRANSCAROTID SYS 10X40 (Permanent Stent) IMPLANT
SUT MNCRL AB 4-0 PS2 18 (SUTURE) ×1 IMPLANT
SUT PROLENE 5 0 C 1 24 (SUTURE) ×1 IMPLANT
SUT SILK 2 0 SH (SUTURE) ×1 IMPLANT
SUT VIC AB 3-0 SH 27X BRD (SUTURE) ×1 IMPLANT
SYR 10ML LL (SYRINGE) ×3 IMPLANT
SYR 20ML LL LF (SYRINGE) ×1 IMPLANT
SYSTEM ENROUTE TCAR NEURO PLUS (FILTER) IMPLANT
TAPE UMBILICAL 1/8X30 (MISCELLANEOUS) IMPLANT
TOWEL GREEN STERILE (TOWEL DISPOSABLE) ×1 IMPLANT
WATER STERILE IRR 1000ML POUR (IV SOLUTION) ×1 IMPLANT
WIRE BENTSON .035X145CM (WIRE) ×1 IMPLANT

## 2023-10-14 NOTE — Anesthesia Procedure Notes (Signed)
 Procedure Name: Intubation Date/Time: 10/14/2023 10:36 AM  Performed by: Virgil Ee, CRNAPre-anesthesia Checklist: Patient identified, Patient being monitored, Timeout performed, Emergency Drugs available and Suction available Patient Re-evaluated:Patient Re-evaluated prior to induction Oxygen  Delivery Method: Circle system utilized Preoxygenation: Pre-oxygenation with 100% oxygen  Induction Type: IV induction Ventilation: Mask ventilation without difficulty Laryngoscope Size: Mac and 4 Grade View: Grade I Tube type: Oral Tube size: 7.0 mm Number of attempts: 1 Airway Equipment and Method: Stylet Placement Confirmation: ETT inserted through vocal cords under direct vision, positive ETCO2 and breath sounds checked- equal and bilateral Secured at: 23 cm Tube secured with: Tape Dental Injury: Teeth and Oropharynx as per pre-operative assessment

## 2023-10-14 NOTE — Op Note (Signed)
 NAME: Eric Cook    MRN: [id number] DOB: 1945/03/08    DATE OF OPERATION: 10/14/2023  PREOP DIAGNOSIS:    Symptomatic left internal carotid artery stenosis  POSTOP DIAGNOSIS:    Same  PROCEDURE:    Left transcarotid artery revascularization  SURGEON: Fonda FORBES Rim  ASSIST: Sherrilee Holster, PA  ANESTHESIA: General  EBL: 15 mL  INDICATIONS:    Eric Cook is a 78 y.o. male who presented last week with TIA and right sided deficits which fortunately resolved.  He also had issues with fine motor skills.  FINDINGS:   Greater than 60% stenosis of the left internal carotid artery with severe atherosclerotic disease  TECHNIQUE:   The patient was brought to the operating room, where support lines were placed and general anesthesia was secured. The left neck and left groin were prepped and the patient was sterilely draped. A transverse 2-4 cm incision was made between the sternal and clavicular heads of the sternocleidomastoid muscle, below the omohyoid. Following longitudinal division of the carotid sheath the jugular vein was partially dissected and retracted medially. Once 3 cm of common carotid artery (CCA) were isolated, umbilical tape was placed around the proximal 1/3 of the CCA under direct vision. A 5.0 polypropylene suture was pre-placed in the anterior wall of the CCA, in a "U stitch" configuration, close to the clavicle to facilitate hemostasis upon removal of the arterial sheath at completion of the TCAR procedure.  The contralateral (left) common femoral vein (CFV) was accessed under ultrasound guidance, using standard Seldinger and micropuncture access technique. Permanent recorded image(s) was/were saved in the patient's medical record. The Venous Return Sheath was advanced into the CFV over the 0.035" wire provided. Blood was aspirated from the flow line followed by flushing of the Venous Sheath with heparinized saline. The Venous Sheath was secured to the  patient's skin with suture to maintain optimal position in the vessel.  Heparin  was given to obtain a therapeutic activated clotting time >250 seconds prior to arterial access. A 4-French non-stiffened ENHANCE Transcarotid / Peripheral Access set was used, puncturing the artery with the 21G needle through the pre-placed "U" stitch while holding gentle traction on the umbilical tape to stabilize and centralize the CCA within the incision. Careful attention was paid to the change in CCA shape when using the umbilical tape to control or lift the artery. The micropuncture wire was then advanced 3-4 cm into the CCA and, the 21G needle was removed. The micropuncture sheath was advanced 2-3 cm into the CCA and the wire and dilator were removed. Pulsatile backflow indicated correct positioning. The provided 0.035 J-tipped guidewire was inserted as close as possible to the bifurcation without engaging the lesion. After micropuncture sheath removal, the Transcarotid Arterial Sheath was advanced to the 2.5cm marker and the 0.035" wire and dilator were then removed. Arterial Sheath position was assessed under fluoroscopy in two projections to ensure that the sheath tip was oriented coaxially in the CCA. The Arterial Sheath was sutured to the patient with gentle forward tension. Blood was slowly aspirated followed by flushing with heparinized saline. No ingress of air bubbles through the passive hemostatic valve was observed. The stopcocks were closed. Traction applied to the CCA previously to facilitate access was gently released.  The Flow Controller was connected to the Transcarotid Arterial Sheath, prepared by passively allowing a column of arterial blood to fill the line and connected to the Venous Return Sheath. CCA inflow was occluded proximal to the arteriotomy  with a vascular clamp to achieve active flow reversal. To confirm flow reversal, a saline bolus was delivered into the venous flow line on both "High" and  "Low" flow settings of the Flow Controller. Angiograms were performed with slow injections of a small amount of contrast filling just past the lesion to minimize antegrade transmission of micro-bubbles.  Prior to lesion manipulation, heart rate (70bpm) and systolic BP (140-159mmHg) were managed upwards to optimize flow reversal and procedural neuroprotection. The lesion was crossed with an 0.014" ENROUTE guidewire and pre-dilation of the lesion was performed with a 6mm x 35mm rapid exchange 0.014" compatible balloon catheter to 8 atmospheres for 10 seconds. Stenting was performed with an 10mm x 40mm ENROUTE Transcarotid stent, sized appropriately to the right CCA. AP and lateral angiograms (gentle contrast injections) were performed to confirm stent placement and arterial wall stent apposition.  The stent was little high, which was purposeful as I wanted to place the stent within the bend of the ICA.  There was 30% residual stenosis which I elected to balloon using a 7 mm angioplasty balloon, followed by extending the stent proximally into the common carotid artery using a 10 mm x 30 mm stent.  Follow-up angiography demonstrated excellent result.  The wire was pulled back.  The tortuosity in the ICA preop was no longer present due to the stent.  At [facility] case completion, antegrade flow was restored by releasing the clamp on the CCA then closing the NPS stopcocks to the flow lines. The Transcarotid Arterial Sheath was removed and the pre-closure suture was tied. Heparin  reversal was employed.  A small amount of thrombin product was left in the wound bed-surgical snow.  Patient was closed in layers using Vicryl with Monocryl and Dermabond to the level of the skin.  The Venous Return Sheath was removed and hemostasis was achieved with brief manual compression.  The patient tolerated the procedure well and was extubated on the table. The patient was moving all four extremities to command prior to transfer to  the recovery room.   Fonda FORBES Rim, MD [facility] DATE OF DICTATION:   10/14/2023

## 2023-10-14 NOTE — Consult Note (Signed)
 Hospital Consult  Patient seen and examined in preop holding.  No complaints. No changes to medication history or physical exam since last seen. After discussing the risks and benefits of LEFT TCAR for symptomatic ICA stenosis, Eric Cook elected to proceed.   Fonda FORBES Rim MD   Reason for Consult: Symptomatic ICA stenosis Requesting Physician: Hospital medicine MRN #:  [id number]  History of Present Illness: This is a 78 y.o. male who presented overnight with TIA-like symptoms-speech difficulties, trouble with his right hand.  Vascular surgery was called after imaging demonstrated moderate stenosis in the right ICA, severe stenosis in the left ICA.  On exam, Eric Cook was doing well.  He was undergoing EEG monitoring.  He noted that yesterday he was unable to text using the right hand due to losing fine motor movements.  He had speech difficulties appreciated by his son.  Fortunately, all of these have resolved.   Past Medical History:  Diagnosis Date   Anxiety    Chronic back pain    lumbar   COPD (chronic obstructive pulmonary disease) (HCC)    HLD (hyperlipidemia)    Hyperlipidemia    Hypertension    Peptic ulcer    Seizures (HCC)    medication related   Stroke [facility])     Past Surgical History:  Procedure Laterality Date   COLONOSCOPY     LUMBAR DISC SURGERY     L3-L4   UPPER GASTROINTESTINAL ENDOSCOPY      No Known Allergies  Prior to Admission medications   Medication Sig Start Date End Date Taking? Authorizing Provider  acetaminophen  (TYLENOL ) 500 MG tablet Take 500 mg by mouth as needed for moderate pain or mild pain (As Needed).   Yes [provider]  amLODipine  (NORVASC ) 5 MG tablet Take 1 tablet (5 mg total) by mouth daily. 10/04/23  Yes Curatolo, Adam, DO  cimetidine  (TAGAMET ) 400 MG tablet TAKE 1 TABLET TWICE DAILY AS NEEDED 07/15/23  Yes Gottschalk, Ashly M, DO  furosemide  (LASIX ) 20 MG tablet TAKE 1 TABLET (20 MG TOTAL) BY MOUTH DAILY AS NEEDED  FOR FLUID. 01/09/23  Yes Gottschalk, Ashly M, DO  levETIRAcetam  (KEPPRA  XR) 500 MG 24 hr tablet Take 1 tablet (500 mg total) by mouth at bedtime. 02/20/23  Yes McCue, Harlene, NP  meloxicam (MOBIC) 15 MG tablet Take 15 mg by mouth daily. 09/20/23  Yes [provider]  sertraline  (ZOLOFT ) 50 MG tablet TAKE 1 TABLET BY MOUTH EVERY DAY 06/04/23  Yes Lavell Lye A, FNP  simvastatin  (ZOCOR ) 20 MG tablet TAKE 1 TABLET EVERY DAY AT 6 PM 08/15/23  Yes Gottschalk, Ashly M, DO  tamsulosin  (FLOMAX ) 0.4 MG CAPS capsule TAKE 1 CAPSULE EVERY DAY 07/29/23  Yes Gottschalk, Ashly M, DO  traZODone  (DESYREL ) 50 MG tablet TAKE 0.5-1 TABLETS BY MOUTH AT BEDTIME AS NEEDED FOR SLEEP. 11/21/22  Yes Gottschalk, Ashly M, DO  dicyclomine  (BENTYL ) 10 MG capsule Take 2 capsules (20 mg total) by mouth 3 (three) times daily before meals. TAKE 2 CAPSULES (20 MG) 15 MINUTES BEFORE MEALS AS NEEDED; Patient not taking: Reported on 10/06/2023 08/09/23   Abran Norleen SAILOR, MD  fluticasone  (FLONASE ) 50 MCG/ACT nasal spray USE 2 SPRAYS IN EACH NOSTRIL EVERY DAY Patient not taking: Reported on 10/06/2023 08/13/22   Jolinda Norene HERO, DO  losartan  (COZAAR ) 100 MG tablet Take 1 tablet (100 mg total) by mouth daily. Patient not taking: Reported on 10/06/2023 07/29/23   Jolinda Norene HERO, DO    Social History  Socioeconomic History   Marital status: Single    Spouse name: Not on file   Number of children: 1   Years of education: Not on file   Highest education level: Some college, no degree  Occupational History   Occupation: Lobbyist    Comment: part time  Tobacco Use   Smoking status: Former    Current packs/day: 0.00    Average packs/day: 1 pack/day for 54.0 years (54.0 ttl pk-yrs)    Types: Cigarettes    Start date: 04/1965    Quit date: 04/2019    Years since quitting: 4.4   Smokeless tobacco: Never  Vaping Use   Vaping status: Never Used  Substance and Sexual Activity   Alcohol use: Never   Drug use:  Never   Sexual activity: Never  Other Topics Concern   Not on file  Social History Narrative   Lives alone   Right Handed   Drinks 2-3 cups of caffeine daily   Son in [place]   Social Drivers of Health   Financial Resource Strain: Patient Declined (07/29/2023)   Overall Financial Resource Strain (CARDIA)    Difficulty of Paying Living Expenses: Patient declined  Food Insecurity: No Food Insecurity (10/10/2023)   Hunger Vital Sign    Worried About Running Out of Food in the Last Year: Never true    Ran Out of Food in the Last Year: Never true  Transportation Needs: No Transportation Needs (10/10/2023)   PRAPARE - Administrator, Civil Service (Medical): No    Lack of Transportation (Non-Medical): No  Physical Activity: Sufficiently Active (07/29/2023)   Exercise Vital Sign    Days of Exercise per Week: 4 days    Minutes of Exercise per Session: 60 min  Stress: No Stress Concern Present (07/29/2023)   [employer] of Occupational Health - Occupational Stress Questionnaire    Feeling of Stress: Only a little  Social Connections: Socially Isolated (10/06/2023)   Social Connection and Isolation Panel    Frequency of Communication with Friends and Family: More than three times a week    Frequency of Social Gatherings with Friends and Family: Twice a week    Attends Religious Services: Patient declined    Database administrator or Organizations: No    Attends Engineer, structural: Not on file    Marital Status: Divorced  Intimate Partner Violence: Not At Risk (10/10/2023)   Humiliation, Afraid, Rape, and Kick questionnaire    Fear of Current or Ex-Partner: No    Emotionally Abused: No    Physically Abused: No    Sexually Abused: No   Family History  Problem Relation Age of Onset   Colon polyps Brother 15   Colon cancer Brother    Colon polyps Son 45   Other Son        vertigo   Kidney Stones Son    Esophageal cancer Neg Hx    Pancreatic cancer Neg Hx     Stomach cancer Neg Hx    Rectal cancer Neg Hx     ROS: Otherwise negative unless mentioned in HPI  Physical Examination  Vitals:   10/14/23 0803  BP: (!) 154/82  Pulse: (!) 59  Resp: 18  Temp: 97.6 F (36.4 C)  SpO2: 98%   Body mass index is 29.53 kg/m.  General:  WDWN in NAD Gait: Not observed HENT: WNL, normocephalic Pulmonary: normal non-labored breathing, without Rales, rhonchi,  wheezing Cardiac: regular Abdomen:  soft, NT/ND,  no masses Skin: without rashes Vascular Exam/Pulses: 2+ pedal pulses Extremities: without ischemic changes, without Gangrene , without cellulitis; without open wounds;  Musculoskeletal: no muscle wasting or atrophy  Neurologic: A&O X 3;  No focal weakness or paresthesias are detected; speech is fluent/normal Psychiatric:  The pt has Normal affect. Lymph:  Unremarkable  CBC    Component Value Date/Time   WBC 8.4 10/07/2023 0609   RBC 4.40 10/07/2023 0609   HGB 14.5 10/07/2023 0609   HGB 13.3 07/01/2023 1107   HCT 42.5 10/07/2023 0609   HCT 41.4 07/01/2023 1107   PLT 179 10/07/2023 0609   PLT 229 07/01/2023 1107   MCV 96.6 10/07/2023 0609   MCV 97 07/01/2023 1107   MCH 33.0 10/07/2023 0609   MCHC 34.1 10/07/2023 0609   RDW 12.9 10/07/2023 0609   RDW 13.7 07/01/2023 1107   LYMPHSABS 1.2 10/05/2023 2008   LYMPHSABS 1.3 07/01/2023 1107   MONOABS 0.9 10/05/2023 2008   EOSABS 0.1 10/05/2023 2008   EOSABS 0.2 07/01/2023 1107   BASOSABS 0.1 10/05/2023 2008   BASOSABS 0.0 07/01/2023 1107    BMET    Component Value Date/Time   NA 134 (L) 10/08/2023 0505   NA 140 07/29/2023 1537   K 4.0 10/08/2023 0505   CL 102 10/08/2023 0505   CO2 20 (L) 10/08/2023 0505   GLUCOSE 106 (H) 10/08/2023 0505   BUN 10 10/08/2023 0505   BUN 14 07/29/2023 1537   CREATININE 0.94 10/08/2023 0505   CALCIUM  9.1 10/08/2023 0505   GFRNONAA >60 10/08/2023 0505   GFRAA 77 09/08/2019 1620    COAGS: Lab Results  Component Value Date   INR 1.0  10/05/2023    ASSESSMENT/PLAN: This is a 78 y.o. male who presented with left hemispheric strokelike symptoms diagnosed with TIA.  MRI negative.  CTA demonstrates greater than 50% stenosis in the left ICA, greater than 80% stenosis in the right ICA.  The left ICA is symptomatic, therefore this needs to be the priority.  I had a long conversation with Eric Cook, and found his son in the cafeteria and had another conversation with him regarding the need for revascularization.  We discussed that with symptomatic ICA stenosis and the risk of repeat stroke is 26% over the next 2 years.  With intervention, this decreases to 9%.  We discussed both carotid endarterectomy versus transcarotid artery revascularization.  I think is a candidate for both.  After discussing the risks and benefits of each, both Eric Cook and his son chose transcarotid artery revascularization.  Eric Cook's daughter-in-law has surgery scheduled for next week.  They elected to hold on a surgery until after she has her oophorectomy.  This will be scheduled by my office with plans for surgery on 9/29.  Please continue dual antiplatelet therapy, high intensity statin through the surgery date.   Fonda FORBES Rim MD [facility] [phone] 10/14/2023  9:39 AM

## 2023-10-14 NOTE — Transfer of Care (Signed)
 Immediate Anesthesia Transfer of Care Note  Patient: Eric Cook  Procedure(s) Performed: LEFT TRANSCAROTID ARTERY REVASCULARIZATION (Left: Neck) ULTRASOUND GUIDANCE, FOR VASCULAR ACCESS (Left: Neck)  Patient Location: PACU  Anesthesia Type:General  Level of Consciousness: awake  Airway & Oxygen  Therapy: Patient Spontanous Breathing  Post-op Assessment: Report given to RN, Post -op Vital signs reviewed and stable, and Patient moving all extremities X 4  Post vital signs: Reviewed and stable  Last Vitals:  Vitals Value Taken Time  BP 137/80 10/14/23 12:33  Temp    Pulse 75 10/14/23 12:38  Resp 17 10/14/23 12:38  SpO2 93 % 10/14/23 12:38  Vitals shown include unfiled device data.  Last Pain:  Vitals:   10/14/23 0837  TempSrc:   PainSc: 0-No pain         Complications: No notable events documented.

## 2023-10-14 NOTE — Progress Notes (Signed)
 Patient arrived at the unit from [facility] arrival pt is alert and oriented X4,CHG bath given,vitals taken,CCMD notified,left neck incision and left groin incision level 0,NIH scale 0,pt oriented to the unit,call bell in reach

## 2023-10-14 NOTE — H&P (Signed)
 Hospital Consult  Patient seen and examined in preop holding.  No complaints. No changes to medication history or physical exam since last seen. After discussing the risks and benefits of LEFT TCAR for symptomatic ICA stenosis, [name] elected to proceed.   Fonda FORBES Rim MD   Reason for Consult: Symptomatic ICA stenosis Requesting Physician: Hospital medicine MRN #:  [id number]  History of Present Illness: This is a 78 y.o. male who presented overnight with TIA-like symptoms-speech difficulties, trouble with his right hand.  Vascular surgery was called after imaging demonstrated moderate stenosis in the right ICA, severe stenosis in the left ICA.  On exam, Ray was doing well.  He was undergoing EEG monitoring.  He noted that yesterday he was unable to text using the right hand due to losing fine motor movements.  He had speech difficulties appreciated by his son.  Fortunately, all of these have resolved.   Past Medical History:  Diagnosis Date   Anxiety    Chronic back pain    lumbar   COPD (chronic obstructive pulmonary disease) (HCC)    HLD (hyperlipidemia)    Hyperlipidemia    Hypertension    Peptic ulcer    Seizures (HCC)    medication related   Stroke [facility])     Past Surgical History:  Procedure Laterality Date   COLONOSCOPY     LUMBAR DISC SURGERY     L3-L4   UPPER GASTROINTESTINAL ENDOSCOPY      No Known Allergies  Prior to Admission medications   Medication Sig Start Date End Date Taking? Authorizing Provider  acetaminophen  (TYLENOL ) 500 MG tablet Take 500 mg by mouth as needed for moderate pain or mild pain (As Needed).   Yes [provider]  amLODipine  (NORVASC ) 5 MG tablet Take 1 tablet (5 mg total) by mouth daily. 10/04/23  Yes Curatolo, Adam, DO  cimetidine  (TAGAMET ) 400 MG tablet TAKE 1 TABLET TWICE DAILY AS NEEDED 07/15/23  Yes Gottschalk, Ashly M, DO  furosemide  (LASIX ) 20 MG tablet TAKE 1 TABLET (20 MG TOTAL) BY MOUTH DAILY AS NEEDED  FOR FLUID. 01/09/23  Yes Jolinda Potter M, DO  levETIRAcetam  (KEPPRA  XR) 500 MG 24 hr tablet Take 1 tablet (500 mg total) by mouth at bedtime. 02/20/23  Yes McCue, Harlene, NP  meloxicam (MOBIC) 15 MG tablet Take 15 mg by mouth daily. 09/20/23  Yes [provider]  sertraline  (ZOLOFT ) 50 MG tablet TAKE 1 TABLET BY MOUTH EVERY DAY 06/04/23  Yes Hawks, Bari A, FNP  simvastatin  (ZOCOR ) 20 MG tablet TAKE 1 TABLET EVERY DAY AT 6 PM 08/15/23  Yes Gottschalk, Ashly M, DO  tamsulosin  (FLOMAX ) 0.4 MG CAPS capsule TAKE 1 CAPSULE EVERY DAY 07/29/23  Yes Gottschalk, Ashly M, DO  traZODone  (DESYREL ) 50 MG tablet TAKE 0.5-1 TABLETS BY MOUTH AT BEDTIME AS NEEDED FOR SLEEP. 11/21/22  Yes Gottschalk, Ashly M, DO  dicyclomine  (BENTYL ) 10 MG capsule Take 2 capsules (20 mg total) by mouth 3 (three) times daily before meals. TAKE 2 CAPSULES (20 MG) 15 MINUTES BEFORE MEALS AS NEEDED; Patient not taking: Reported on 10/06/2023 08/09/23   Abran Norleen SAILOR, MD  fluticasone  (FLONASE ) 50 MCG/ACT nasal spray USE 2 SPRAYS IN EACH NOSTRIL EVERY DAY Patient not taking: Reported on 10/06/2023 08/13/22   Jolinda Potter HERO, DO  losartan  (COZAAR ) 100 MG tablet Take 1 tablet (100 mg total) by mouth daily. Patient not taking: Reported on 10/06/2023 07/29/23   Jolinda Potter HERO, DO    Social History  Socioeconomic History   Marital status: Single    Spouse name: Not on file   Number of children: 1   Years of education: Not on file   Highest education level: Some college, no degree  Occupational History   Occupation: Lobbyist    Comment: part time  Tobacco Use   Smoking status: Former    Current packs/day: 0.00    Average packs/day: 1 pack/day for 54.0 years (54.0 ttl pk-yrs)    Types: Cigarettes    Start date: 04/1965    Quit date: 04/2019    Years since quitting: 4.4   Smokeless tobacco: Never  Vaping Use   Vaping status: Never Used  Substance and Sexual Activity   Alcohol use: Never   Drug use:  Never   Sexual activity: Never  Other Topics Concern   Not on file  Social History Narrative   Lives alone   Right Handed   Drinks 2-3 cups of caffeine daily   Son in [place]   Social Drivers of Health   Financial Resource Strain: Patient Declined (07/29/2023)   Overall Financial Resource Strain (CARDIA)    Difficulty of Paying Living Expenses: Patient declined  Food Insecurity: No Food Insecurity (10/10/2023)   Hunger Vital Sign    Worried About Running Out of Food in the Last Year: Never true    Ran Out of Food in the Last Year: Never true  Transportation Needs: No Transportation Needs (10/10/2023)   PRAPARE - Administrator, Civil Service (Medical): No    Lack of Transportation (Non-Medical): No  Physical Activity: Sufficiently Active (07/29/2023)   Exercise Vital Sign    Days of Exercise per Week: 4 days    Minutes of Exercise per Session: 60 min  Stress: No Stress Concern Present (07/29/2023)   [employer] of Occupational Health - Occupational Stress Questionnaire    Feeling of Stress: Only a little  Social Connections: Socially Isolated (10/06/2023)   Social Connection and Isolation Panel    Frequency of Communication with Friends and Family: More than three times a week    Frequency of Social Gatherings with Friends and Family: Twice a week    Attends Religious Services: Patient declined    Database administrator or Organizations: No    Attends Engineer, structural: Not on file    Marital Status: Divorced  Intimate Partner Violence: Not At Risk (10/10/2023)   Humiliation, Afraid, Rape, and Kick questionnaire    Fear of Current or Ex-Partner: No    Emotionally Abused: No    Physically Abused: No    Sexually Abused: No   Family History  Problem Relation Age of Onset   Colon polyps Brother 82   Colon cancer Brother    Colon polyps Son 73   Other Son        vertigo   Kidney Stones Son    Esophageal cancer Neg Hx    Pancreatic cancer Neg Hx     Stomach cancer Neg Hx    Rectal cancer Neg Hx     ROS: Otherwise negative unless mentioned in HPI  Physical Examination  Vitals:   10/14/23 0803  BP: (!) 154/82  Pulse: (!) 59  Resp: 18  Temp: 97.6 F (36.4 C)  SpO2: 98%   Body mass index is 29.53 kg/m.  General:  WDWN in NAD Gait: Not observed HENT: WNL, normocephalic Pulmonary: normal non-labored breathing, without Rales, rhonchi,  wheezing Cardiac: regular Abdomen:  soft, NT/ND,  no masses Skin: without rashes Vascular Exam/Pulses: 2+ pedal pulses Extremities: without ischemic changes, without Gangrene , without cellulitis; without open wounds;  Musculoskeletal: no muscle wasting or atrophy  Neurologic: A&O X 3;  No focal weakness or paresthesias are detected; speech is fluent/normal Psychiatric:  The pt has Normal affect. Lymph:  Unremarkable  CBC    Component Value Date/Time   WBC 8.4 10/07/2023 0609   RBC 4.40 10/07/2023 0609   HGB 14.5 10/07/2023 0609   HGB 13.3 07/01/2023 1107   HCT 42.5 10/07/2023 0609   HCT 41.4 07/01/2023 1107   PLT 179 10/07/2023 0609   PLT 229 07/01/2023 1107   MCV 96.6 10/07/2023 0609   MCV 97 07/01/2023 1107   MCH 33.0 10/07/2023 0609   MCHC 34.1 10/07/2023 0609   RDW 12.9 10/07/2023 0609   RDW 13.7 07/01/2023 1107   LYMPHSABS 1.2 10/05/2023 2008   LYMPHSABS 1.3 07/01/2023 1107   MONOABS 0.9 10/05/2023 2008   EOSABS 0.1 10/05/2023 2008   EOSABS 0.2 07/01/2023 1107   BASOSABS 0.1 10/05/2023 2008   BASOSABS 0.0 07/01/2023 1107    BMET    Component Value Date/Time   NA 134 (L) 10/08/2023 0505   NA 140 07/29/2023 1537   K 4.0 10/08/2023 0505   CL 102 10/08/2023 0505   CO2 20 (L) 10/08/2023 0505   GLUCOSE 106 (H) 10/08/2023 0505   BUN 10 10/08/2023 0505   BUN 14 07/29/2023 1537   CREATININE 0.94 10/08/2023 0505   CALCIUM  9.1 10/08/2023 0505   GFRNONAA >60 10/08/2023 0505   GFRAA 77 09/08/2019 1620    COAGS: Lab Results  Component Value Date   INR 1.0  10/05/2023    ASSESSMENT/PLAN: This is a 78 y.o. male who presented with left hemispheric strokelike symptoms diagnosed with TIA.  MRI negative.  CTA demonstrates greater than 50% stenosis in the left ICA, greater than 80% stenosis in the right ICA.  The left ICA is symptomatic, therefore this needs to be the priority.  I had a long conversation with Ray, and found his son in the cafeteria and had another conversation with him regarding the need for revascularization.  We discussed that with symptomatic ICA stenosis and the risk of repeat stroke is 26% over the next 2 years.  With intervention, this decreases to 9%.  We discussed both carotid endarterectomy versus transcarotid artery revascularization.  I think is a candidate for both.  After discussing the risks and benefits of each, both Ray and his son chose transcarotid artery revascularization.  Ray's daughter-in-law has surgery scheduled for next week.  They elected to hold on a surgery until after she has her oophorectomy.  This will be scheduled by my office with plans for surgery on 9/29.  Please continue dual antiplatelet therapy, high intensity statin through the surgery date.   Fonda FORBES Rim MD [facility] [phone] 10/14/2023  9:40 AM

## 2023-10-14 NOTE — Anesthesia Postprocedure Evaluation (Signed)
 Anesthesia Post Note  Patient: Eric Cook  Procedure(s) Performed: LEFT TRANSCAROTID ARTERY REVASCULARIZATION (Left: Neck) ULTRASOUND GUIDANCE, FOR VASCULAR ACCESS (Left: Neck)     Patient location during evaluation: PACU Anesthesia Type: General Level of consciousness: patient cooperative Pain management: pain level controlled Vital Signs Assessment: post-procedure vital signs reviewed and stable Respiratory status: spontaneous breathing Cardiovascular status: stable Postop Assessment: no apparent nausea or vomiting, no headache and adequate PO intake Anesthetic complications: no   No notable events documented.                Lauraine KATHEE [place]

## 2023-10-14 NOTE — Progress Notes (Signed)
 Doing well postop - no deficits

## 2023-10-14 NOTE — Anesthesia Procedure Notes (Signed)
 Arterial Line Insertion Start/End9/22/2025 9:29 AM, 10/14/2023 9:29 AM Performed by: CRNA  Patient location: Pre-op. Preanesthetic checklist: patient identified, IV checked, site marked, risks and benefits discussed, surgical consent, monitors and equipment checked, pre-op evaluation, timeout performed and anesthesia consent Lidocaine  1% used for infiltration Left, radial was placed Catheter size: 20 G Hand hygiene performed  and maximum sterile barriers used   Attempts: 1 Procedure performed without using ultrasound guided technique. Following insertion, dressing applied and Biopatch. Post procedure assessment: normal  Patient tolerated the procedure well with no immediate complications.

## 2023-10-14 NOTE — Discharge Instructions (Signed)

## 2023-10-15 ENCOUNTER — Encounter (HOSPITAL_COMMUNITY): Payer: Self-pay | Admitting: Vascular Surgery

## 2023-10-15 ENCOUNTER — Other Ambulatory Visit (HOSPITAL_COMMUNITY): Payer: Self-pay

## 2023-10-15 LAB — CBC
HCT: 35.8 % — ABNORMAL LOW (ref 39.0–52.0)
Hemoglobin: 12.5 g/dL — ABNORMAL LOW (ref 13.0–17.0)
MCH: 32.6 pg (ref 26.0–34.0)
MCHC: 34.9 g/dL (ref 30.0–36.0)
MCV: 93.2 fL (ref 80.0–100.0)
Platelets: 227 K/uL (ref 150–400)
RBC: 3.84 MIL/uL — ABNORMAL LOW (ref 4.22–5.81)
RDW: 12.5 % (ref 11.5–15.5)
WBC: 10.4 K/uL (ref 4.0–10.5)
nRBC: 0 % (ref 0.0–0.2)

## 2023-10-15 LAB — COMPREHENSIVE METABOLIC PANEL WITH GFR
ALT: 19 U/L (ref 0–44)
AST: 25 U/L (ref 15–41)
Albumin: 3.5 g/dL (ref 3.5–5.0)
Alkaline Phosphatase: 66 U/L (ref 38–126)
Anion gap: 9 (ref 5–15)
BUN: 12 mg/dL (ref 8–23)
CO2: 24 mmol/L (ref 22–32)
Calcium: 9 mg/dL (ref 8.9–10.3)
Chloride: 105 mmol/L (ref 98–111)
Creatinine, Ser: 1 mg/dL (ref 0.61–1.24)
GFR, Estimated: >60 mL/min (ref >60)
Glucose, Bld: 119 mg/dL — ABNORMAL HIGH (ref 70–99)
Potassium: 3.1 mmol/L — ABNORMAL LOW (ref 3.5–5.1)
Sodium: 138 mmol/L (ref 135–145)
Total Bilirubin: 0.7 mg/dL (ref 0.0–1.2)
Total Protein: 6 g/dL — ABNORMAL LOW (ref 6.5–8.1)

## 2023-10-15 LAB — LIPID PANEL
Cholesterol: 100 mg/dL (ref 0–200)
HDL: 31 mg/dL — ABNORMAL LOW (ref >40)
LDL Cholesterol: 47 mg/dL (ref 0–99)
Total CHOL/HDL Ratio: 3.2 ratio
Triglycerides: 109 mg/dL (ref <150)
VLDL: 22 mg/dL (ref 0–40)

## 2023-10-15 MED ORDER — STROKE: EARLY STAGES OF RECOVERY BOOK
Freq: Once | Status: DC
  Filled 2023-10-15: qty 1

## 2023-10-15 MED ORDER — OXYCODONE HCL 5 MG PO TABS
5.0000 mg | ORAL_TABLET | Freq: Four times a day (QID) | ORAL | 0 refills | Status: DC | PRN
  Filled 2023-10-15: qty 12, 3d supply, fill #0

## 2023-10-15 NOTE — Progress Notes (Signed)
 PHARMACIST LIPID MONITORING   Eric Cook is a 78 y.o. male admitted on 10/14/2023 with L carotid artery sternosis. Pharmacy has been consulted to optimize lipid-lowering therapy with the indication of secondary prevention for clinical ASCVD.  Recent Labs:  Lipid Panel (last 6 months):   Lab Results  Component Value Date   CHOL 100 10/15/2023   TRIG 109 10/15/2023   HDL 31 (L) 10/15/2023   CHOLHDL 3.2 10/15/2023   VLDL 22 10/15/2023   LDLCALC 47 10/15/2023    Hepatic function panel (last 6 months):   Lab Results  Component Value Date   AST 25 10/15/2023   ALT 19 10/15/2023   ALKPHOS 66 10/15/2023   BILITOT 0.7 10/15/2023    SCr (since admission):   Serum creatinine: 1 mg/dL 90/76/74 9588 Estimated creatinine clearance: 68.9 mL/min  Current therapy and lipid therapy tolerance Current lipid-lowering therapy: crestor  20mg /d Previous lipid-lowering therapies (if applicable): unk Documented or reported allergies or intolerances to lipid-lowering therapies (if applicable): unk  Assessment:   Patient is excluded from the protocol due to controlled LDL (ESRD, elevated LFTs, pregnancy/breastfeeding, active liver disease)  Plan:    1.Statin intensity (high intensity recommended for all patients regardless of the LDL):  No statin changes. The patient is already on a high intensity statin.  2.Add ezetimibe (if any one of the following):   Not indicated at this time.  3.Refer to lipid clinic:   No  4.Follow-up with:  Primary care provider - Jolinda Potter M, DO  5.Follow-up labs after discharge:  No changes in lipid therapy, repeat a lipid panel in one year.        Ozell Jamaica, PharmD, BCPS, [facility] Clinical Pharmacist [phone] Please check AMION for all [facility] numbers 10/15/2023

## 2023-10-15 NOTE — Plan of Care (Signed)

## 2023-10-15 NOTE — Progress Notes (Addendum)
  Progress Note    10/15/2023 7:40 AM 1 Day Post-Op  Subjective:  no complaints, about to eat breakfast    Vitals:   10/15/23 0200 10/15/23 0513  BP:  (!) 164/75  Pulse: 70 (!) 107  Resp:  20  Temp:  98.7 F (37.1 C)  SpO2:  95%    Physical Exam: General:  sitting up in bed, NAD Cardiac:  regular Lungs:  nonlabored Incisions:  left sided neck incision c/d/l  Extremities:  moving all extremities equally  Neuro: no slurred speech, weakness, or numbness   CBC    Component Value Date/Time   WBC 10.4 10/15/2023 0409   RBC 3.84 (L) 10/15/2023 0409   HGB 12.5 (L) 10/15/2023 0409   HGB 13.3 07/01/2023 1107   HCT 35.8 (L) 10/15/2023 0409   HCT 41.4 07/01/2023 1107   PLT 227 10/15/2023 0409   PLT 229 07/01/2023 1107   MCV 93.2 10/15/2023 0409   MCV 97 07/01/2023 1107   MCH 32.6 10/15/2023 0409   MCHC 34.9 10/15/2023 0409   RDW 12.5 10/15/2023 0409   RDW 13.7 07/01/2023 1107   LYMPHSABS 1.2 10/05/2023 2008   LYMPHSABS 1.3 07/01/2023 1107   MONOABS 0.9 10/05/2023 2008   EOSABS 0.1 10/05/2023 2008   EOSABS 0.2 07/01/2023 1107   BASOSABS 0.1 10/05/2023 2008   BASOSABS 0.0 07/01/2023 1107    BMET    Component Value Date/Time   NA 138 10/15/2023 0411   NA 140 07/29/2023 1537   K 3.1 (L) 10/15/2023 0411   CL 105 10/15/2023 0411   CO2 24 10/15/2023 0411   GLUCOSE 119 (H) 10/15/2023 0411   BUN 12 10/15/2023 0411   BUN 14 07/29/2023 1537   CREATININE 1.00 10/15/2023 0411   CALCIUM  9.0 10/15/2023 0411   GFRNONAA >60 10/15/2023 0411   GFRAA 77 09/08/2019 1620    INR    Component Value Date/Time   INR 1.0 10/05/2023 2008     Intake/Output Summary (Last 24 hours) at 10/15/2023 0740 Last data filed at 10/14/2023 1718 Gross per 24 hour  Intake 340 ml  Output 1125 ml  Net -785 ml      Assessment/Plan:  78 y.o. male is 1 day post op, s/p: L TCAR   -He is doing well this morning without any complaints -Left sided neck incision is soft without  hematoma -He is neurologically intact on exam without stroke-like symptoms -He is tolerating a normal diet without swallowing difficulty. He is also ambulating at baseline and voiding without difficulty -Labs are stable this morning. Hgb 12.5 -Stable for d/c home today. Will arrange follow up in 4 weeks. Continue asa, plavix , and statin   Ahmed Holster, PA-C [facility] [phone] 10/15/2023 7:40 AM  VASCULAR STAFF ADDENDUM: I have independently interviewed and examined the patient. I agree with the above.  Plan with be for right sided revascularization at the 6 week mark for asymptomatic critical ICA disease  Fonda FORBES Rim MD [facility] Phone Number: [phone] 10/15/2023 9:42 AM

## 2023-10-15 NOTE — Progress Notes (Signed)
 Aline removed, no bleeding issue noted, will continue to monitor

## 2023-10-15 NOTE — Progress Notes (Signed)
 Discharge Instruction given to patient and son.  Patient and son states that they have received stroke education for the primary nurse.  Son states, I have the stroke education booklet at home.   Extensive discharge education completed.  Both verbally understands discharge instructions and able to teach back.  NO PIV or TELE on during discharge instruction.  TOC meds ready Volunteer services at bedside.

## 2023-10-16 ENCOUNTER — Telehealth: Payer: Self-pay | Admitting: *Deleted

## 2023-10-16 ENCOUNTER — Encounter: Payer: Self-pay | Admitting: Family Medicine

## 2023-10-16 ENCOUNTER — Ambulatory Visit: Admitting: Family Medicine

## 2023-10-16 VITALS — BP 125/63 | HR 62 | Temp 97.6°F | Ht 69.0 in | Wt 203.1 lb

## 2023-10-16 DIAGNOSIS — I6529 Occlusion and stenosis of unspecified carotid artery: Secondary | ICD-10-CM

## 2023-10-16 DIAGNOSIS — Z09 Encounter for follow-up examination after completed treatment for conditions other than malignant neoplasm: Secondary | ICD-10-CM | POA: Diagnosis not present

## 2023-10-16 DIAGNOSIS — E876 Hypokalemia: Secondary | ICD-10-CM | POA: Diagnosis not present

## 2023-10-16 DIAGNOSIS — R2990 Unspecified symptoms and signs involving the nervous system: Secondary | ICD-10-CM | POA: Diagnosis not present

## 2023-10-16 DIAGNOSIS — R051 Acute cough: Secondary | ICD-10-CM

## 2023-10-16 MED ORDER — POTASSIUM CHLORIDE CRYS ER 20 MEQ PO TBCR: 20.0000 meq | EXTENDED_RELEASE_TABLET | Freq: Two times a day (BID) | ORAL | 0 refills | Status: DC

## 2023-10-16 NOTE — Transitions of Care (Post Inpatient/ED Visit) (Signed)
   10/16/2023  Name: Eric Cook MRN: [id number] DOB: 14-Jun-1945  Today's TOC FU Call Status: Today's TOC FU Call Status:: Unsuccessful Call (1st Attempt) Unsuccessful Call (1st Attempt) Date: 10/16/23  Attempted to reach the patient regarding the most recent Inpatient/ED visit.  Follow Up Plan: Additional outreach attempts will be made to reach the patient to complete the Transitions of Care (Post Inpatient/ED visit) call.   Cathlean Headland BSN RN [facility] Health Care Management Coordinator [email or web address] Direct Dial: [phone]  Fax: [phone] Website: [email or web address]

## 2023-10-16 NOTE — Patient Instructions (Signed)
 Hypokalemia Hypokalemia means that the amount of potassium in the blood is lower than normal. Potassium is a mineral (electrolyte) that helps regulate the amount of fluid in the body. It also stimulates muscle tightening (contraction) and helps nerves work properly. Normally, most of the body's potassium is inside cells, and only a very small amount is in the blood. Because the amount in the blood is so small, minor changes to potassium levels in the blood can be life-threatening. What are the causes? This condition may be caused by: Antibiotic medicine. Diarrhea or vomiting. Taking too much of a medicine that helps you have a bowel movement (laxative) can cause diarrhea and lead to hypokalemia. Chronic kidney disease (CKD). Medicines that help the body get rid of excess fluid (diuretics). Eating disorders, such as anorexia or bulimia. Low magnesium levels in the body. Sweating a lot. What are the signs or symptoms? Symptoms of this condition include: Weakness. Constipation. Fatigue. Muscle cramps. Mental confusion. Skipped heartbeats or irregular heartbeat (palpitations). Tingling or numbness. How is this diagnosed? This condition is diagnosed with a blood test. How is this treated? This condition may be treated by: Taking potassium supplements. Adjusting the medicines that you take. Eating more foods that contain a lot of potassium. If your potassium level is very low, you may need to get potassium through an IV and be monitored in the hospital. Follow these instructions at home: Eating and drinking  Eat a healthy diet. A healthy diet includes fresh fruits and vegetables, whole grains, healthy fats, and lean proteins. If told, eat more foods that contain a lot of potassium. These include: Nuts, such as peanuts and pistachios. Seeds, such as sunflower seeds and pumpkin seeds. Peas, lentils, and lima beans. Whole grain and bran cereals and breads. Fresh fruits and vegetables,  such as apricots, avocado, bananas, cantaloupe, kiwi, oranges, tomatoes, asparagus, and potatoes. Juices, such as orange, tomato, and prune. Lean meats, including fish. Milk and milk products, such as yogurt. General instructions Take over-the-counter and prescription medicines only as told by your health care provider. This includes vitamins, natural food products, and supplements. Keep all follow-up visits. This is important. Contact a health care provider if: You have weakness that gets worse. You feel your heart pounding or racing. You vomit. You have diarrhea. You have diabetes and you have trouble keeping your blood sugar in your target range. Get help right away if: You have chest pain. You have shortness of breath. You have vomiting or diarrhea that lasts for more than 2 days. You faint. These symptoms may be an emergency. Get help right away. Call 911. Do not wait to see if the symptoms will go away. Do not drive yourself to the hospital. Summary Hypokalemia means that the amount of potassium in the blood is lower than normal. This condition is diagnosed with a blood test. Hypokalemia may be treated by taking potassium supplements, adjusting the medicines that you take, or eating more foods that are high in potassium. If your potassium level is very low, you may need to get potassium through an IV and be monitored in the hospital. This information is not intended to replace advice given to you by your health care provider. Make sure you discuss any questions you have with your health care provider. Document Revised: 09/22/2020 Document Reviewed: 09/22/2020 Elsevier Patient Education  2024 [employer].

## 2023-10-16 NOTE — Progress Notes (Signed)
 Subjective: CC:TIA/ hospital dc follow up PCP: Jolinda Norene HERO, DO YEP:Eric Cook is a 78 y.o. male presenting to clinic today for:  Discussed the use of AI scribe software for clinical note transcription with the patient, who gave verbal consent to proceed.  History of Present Illness  Patient was found to have TIA due to high-grade stenosis.  He was advised to continue 21 days of dual antiplatelet therapy with Plavix  and aspirin .  Then aspirin  alone.  He had left transcarotid artery revascularization on 10/14/2023.  Discharged with home health services.  He is brought to the office today by a distant relative as his son is having procedure himself.  He had labs drawn yesterday.  Has not been contacted about them just yet.  Reports that he seems to be doing well after his procedure.  No difficulty swallowing.  No substantial soreness and no drainage along the incision site.  He does have many areas where he had labs drawn that are bruised and still wrapped.  Not sure if they need to unwrap the bandages today or replace them.  He is overall feeling well and has been doing well off of the amlodipine  and losartan .  He actually mowed the lawn shortly after discharge from the hospital.  Compliant with the beta-blocker.  His son has gone through all of his medications and already contacted the pharmacy to make sure that the appropriate meds are being filled going forward.   ROS: Per HPI  No Known Allergies Past Medical History:  Diagnosis Date   Anxiety    Chronic back pain    lumbar   COPD (chronic obstructive pulmonary disease) (HCC)    HLD (hyperlipidemia)    Hyperlipidemia    Hypertension    Peptic ulcer    Seizures (HCC)    medication related   Stroke [facility])     Current Outpatient Medications:    acetaminophen  (TYLENOL ) 500 MG tablet, Take 500 mg by mouth as needed for moderate pain or mild pain (As Needed)., Disp: , Rfl:    aspirin  EC 81 MG tablet, Take 1 tablet (81 mg  total) by mouth daily. Swallow whole., Disp: 30 tablet, Rfl: 0   clopidogrel  (PLAVIX ) 75 MG tablet, Take 1 tablet (75 mg total) by mouth daily., Disp: 30 tablet, Rfl: 0   furosemide  (LASIX ) 20 MG tablet, TAKE 1 TABLET (20 MG TOTAL) BY MOUTH DAILY AS NEEDED FOR FLUID., Disp: 90 tablet, Rfl: 0   levETIRAcetam  (KEPPRA  XR) 500 MG 24 hr tablet, Take 1 tablet (500 mg total) by mouth at bedtime., Disp: 90 tablet, Rfl: 3   metoprolol  tartrate (LOPRESSOR ) 25 MG tablet, Take 1 tablet (25 mg total) by mouth 2 (two) times daily., Disp: 60 tablet, Rfl: 0   oxyCODONE  (OXY IR/ROXICODONE ) 5 MG immediate release tablet, Take 1 tablet (5 mg total) by mouth every 6 (six) hours as needed for moderate pain (pain score 4-6)., Disp: 12 tablet, Rfl: 0   pantoprazole  (PROTONIX ) 40 MG tablet, Take 1 tablet (40 mg total) by mouth daily., Disp: 15 tablet, Rfl: 0   rosuvastatin  (CRESTOR ) 20 MG tablet, Take 1 tablet (20 mg total) by mouth daily., Disp: 30 tablet, Rfl: 0   sertraline  (ZOLOFT ) 50 MG tablet, TAKE 1 TABLET BY MOUTH EVERY DAY, Disp: 90 tablet, Rfl: 2   tamsulosin  (FLOMAX ) 0.4 MG CAPS capsule, Take 1 capsule (0.4 mg total) by mouth daily after supper., Disp: 30 capsule, Rfl: 0   traZODone  (DESYREL ) 50 MG tablet, TAKE  0.5-1 TABLETS BY MOUTH AT BEDTIME AS NEEDED FOR SLEEP., Disp: 90 tablet, Rfl: 3 Social History   Socioeconomic History   Marital status: Single    Spouse name: Not on file   Number of children: 1   Years of education: Not on file   Highest education level: Some college, no degree  Occupational History   Occupation: Lobbyist    Comment: part time  Tobacco Use   Smoking status: Former    Current packs/day: 0.00    Average packs/day: 1 pack/day for 54.0 years (54.0 ttl pk-yrs)    Types: Cigarettes    Start date: 04/1965    Quit date: 04/2019    Years since quitting: 4.4   Smokeless tobacco: Never  Vaping Use   Vaping status: Never Used  Substance and Sexual Activity   Alcohol  use: Never   Drug use: Never   Sexual activity: Never  Other Topics Concern   Not on file  Social History Narrative   Lives alone   Right Handed   Drinks 2-3 cups of caffeine daily   Son in [place]   Social Drivers of Health   Financial Resource Strain: Patient Declined (07/29/2023)   Overall Financial Resource Strain (CARDIA)    Difficulty of Paying Living Expenses: Patient declined  Food Insecurity: No Food Insecurity (10/15/2023)   Hunger Vital Sign    Worried About Running Out of Food in the Last Year: Never true    Ran Out of Food in the Last Year: Never true  Transportation Needs: No Transportation Needs (10/15/2023)   PRAPARE - Administrator, Civil Service (Medical): No    Lack of Transportation (Non-Medical): No  Physical Activity: Sufficiently Active (07/29/2023)   Exercise Vital Sign    Days of Exercise per Week: 4 days    Minutes of Exercise per Session: 60 min  Stress: No Stress Concern Present (07/29/2023)   [employer] of Occupational Health - Occupational Stress Questionnaire    Feeling of Stress: Only a little  Social Connections: Unknown (10/15/2023)   Social Connection and Isolation Panel    Frequency of Communication with Friends and Family: More than three times a week    Frequency of Social Gatherings with Friends and Family: Twice a week    Attends Religious Services: Not on Marketing executive or Organizations: Not on file    Attends Banker Meetings: Not on file    Marital Status: Not on file  Recent Concern: Social Connections - Socially Isolated (10/06/2023)   Social Connection and Isolation Panel    Frequency of Communication with Friends and Family: More than three times a week    Frequency of Social Gatherings with Friends and Family: Twice a week    Attends Religious Services: Patient declined    Database administrator or Organizations: No    Attends Engineer, structural: Not on file    Marital  Status: Divorced  Intimate Partner Violence: Unknown (10/15/2023)   Humiliation, Afraid, Rape, and Kick questionnaire    Fear of Current or Ex-Partner: No    Emotionally Abused: No    Physically Abused: Not on file    Sexually Abused: Not on file   Family History  Problem Relation Age of Onset   Colon polyps Brother 25   Colon cancer Brother    Colon polyps Son 37   Other Son        vertigo  Kidney Stones Son    Esophageal cancer Neg Hx    Pancreatic cancer Neg Hx    Stomach cancer Neg Hx    Rectal cancer Neg Hx     Objective: Office vital signs reviewed. BP 125/63   Pulse 62   Temp 97.6 F (36.4 C)   Ht 5' 9 (1.753 m)   Wt 203 lb 2 oz (92.1 kg)   SpO2 93%   BMI 30.00 kg/m   Physical Examination:  General: Awake, alert, well nourished, No acute distress HEENT: sclera white, MMM Neck: Uncomplicated appearing postop site along the left side of the neck that has some swelling and surrounding ecchymosis but no warmth, induration or drainage Cardio: regular rate and rhythm, S1S2 heard, no murmurs appreciated Pulm: clear to auscultation bilaterally, no wheezes, rhonchi or rales; normal work of breathing on room air.  Intermittently coughs/clears throat Skin: Multiple areas of ecchymosis along bilateral forearms and hands  Assessment/ Plan: 78 y.o. male   Assessment & Plan  Stroke-like symptom  Hospital discharge follow-up  Hypokalemia - Plan: potassium chloride  SA (KLOR-CON  M) 20 MEQ tablet  Acute cough  Status post left transcarotid artery revascularization and doing well.  Labs were obtained yesterday and demonstrated hypokalemia so I have replaced his potassium for the next 3 days.    His lung exam was unremarkable but I did appreciate a slight wet sounding cough today that was intermittent and I suspect it is coming from the upper bronchi.  We discussed red flag signs and symptoms warranting reevaluation and chest x-ray and both he and his family member voiced  good understanding.  Okay to proceed with plain Mucinex  and water  to try and promote expectoration of the mucus.  No orders of the defined types were placed in this encounter.  No orders of the defined types were placed in this encounter.   Today's visit is for Transitional Care Management.  The patient was discharged from [facility] on 10/09/2023 with a primary diagnosis of TIA.   Contact with the patient and/or caregiver, by a clinical staff member, was made on 10/10/2023 and was documented as a telephone encounter within the EMR.  Through chart review and discussion with the patient I have determined that management of their condition is of moderate complexity.    Norene CHRISTELLA Fielding, DO [facility] [phone]

## 2023-10-17 ENCOUNTER — Telehealth: Payer: Self-pay | Admitting: *Deleted

## 2023-10-17 ENCOUNTER — Telehealth: Admitting: *Deleted

## 2023-10-17 DIAGNOSIS — I5032 Chronic diastolic (congestive) heart failure: Secondary | ICD-10-CM | POA: Diagnosis not present

## 2023-10-17 DIAGNOSIS — I6521 Occlusion and stenosis of right carotid artery: Secondary | ICD-10-CM | POA: Diagnosis not present

## 2023-10-17 DIAGNOSIS — I700 Atherosclerosis of aorta: Secondary | ICD-10-CM | POA: Diagnosis not present

## 2023-10-17 DIAGNOSIS — H524 Presbyopia: Secondary | ICD-10-CM | POA: Diagnosis not present

## 2023-10-17 DIAGNOSIS — I6522 Occlusion and stenosis of left carotid artery: Secondary | ICD-10-CM | POA: Diagnosis not present

## 2023-10-17 DIAGNOSIS — J439 Emphysema, unspecified: Secondary | ICD-10-CM | POA: Diagnosis not present

## 2023-10-17 DIAGNOSIS — I3481 Nonrheumatic mitral (valve) annulus calcification: Secondary | ICD-10-CM | POA: Diagnosis not present

## 2023-10-17 DIAGNOSIS — I110 Hypertensive heart disease with heart failure: Secondary | ICD-10-CM | POA: Diagnosis not present

## 2023-10-17 DIAGNOSIS — I2510 Atherosclerotic heart disease of native coronary artery without angina pectoris: Secondary | ICD-10-CM | POA: Diagnosis not present

## 2023-10-17 DIAGNOSIS — I890 Lymphedema, not elsewhere classified: Secondary | ICD-10-CM | POA: Diagnosis not present

## 2023-10-17 NOTE — Transitions of Care (Post Inpatient/ED Visit) (Signed)
 10/17/2023  Name: Eric Cook MRN: [id number] DOB: [birth year]  Today's TOC FU Call Status: Today's TOC FU Call Status:: Successful TOC FU Call Completed TOC FU Call Complete Date: 10/17/23 Patient's Name and Date of Birth confirmed.  Transition Care Management Follow-up Telephone Call Date of Discharge: 10/15/23 Discharge Facility: [facility]) Type of Discharge: Inpatient Admission Primary Inpatient Discharge Diagnosis:: symptomatic carotid artery stenosis- left How have you been since you were released from the hospital?: Better (eating, drinking well, pt ambulating well)  Items Reviewed: Did you receive and understand the discharge instructions provided?: Yes Any new allergies since your discharge?: No Dietary orders reviewed?: Yes Type of Diet Ordered:: heart healthy Do you have support at home?: Yes People in Home [RPT]: alone Name of Support/Comfort Primary Source: adult son [name] assists pt as needed Reviewed signs /symptoms TIA/ CVA action plan- importance of seeking emergency assistance immediately Reviewed signs /symptoms of infection  Medications Reviewed Today: Medications Reviewed Today     Reviewed by Aura Mliss LABOR, RN (Registered Nurse) on 10/17/23 at [phone]  Med List Status: <None>   Medication Order Taking? Sig Documenting Provider Last Dose Status Informant  acetaminophen  (TYLENOL ) 500 MG tablet [id number] Yes Take 500 mg by mouth as needed for moderate pain or mild pain (As Needed). [provider]  Active Self, Child, Pharmacy Records  aspirin  EC 81 MG tablet [id number] Yes Take 1 tablet (81 mg total) by mouth daily. Swallow whole. Dino Antu, MD  Active   clopidogrel  (PLAVIX ) 75 MG tablet [id number] Yes Take 1 tablet (75 mg total) by mouth daily. Rashid, Farhan, MD  Active   furosemide  (LASIX ) 20 MG tablet [id number] Yes TAKE 1 TABLET (20 MG TOTAL) BY MOUTH DAILY AS NEEDED FOR FLUID. Jolinda Norene HERO, DO  Active Self, Child, Pharmacy Records   levETIRAcetam  (KEPPRA  XR) 500 MG 24 hr tablet [id number] Yes Take 1 tablet (500 mg total) by mouth at bedtime. Whitfield Raisin, NP  Active Self, Child, Pharmacy Records  metoprolol  tartrate (LOPRESSOR ) 25 MG tablet [id number] Yes Take 1 tablet (25 mg total) by mouth 2 (two) times daily. Dino Antu, MD  Active   oxyCODONE  (OXY IR/ROXICODONE ) 5 MG immediate release tablet [id number]  Take 1 tablet (5 mg total) by mouth every 6 (six) hours as needed for moderate pain (pain score 4-6).  Patient not taking: Reported on 10/17/2023   Elna Ahmed SQUIBB, PA-C  Active   pantoprazole  (PROTONIX ) 40 MG tablet [id number] Yes Take 1 tablet (40 mg total) by mouth daily. Rashid, Farhan, MD  Active   potassium chloride  SA (KLOR-CON  M) 20 MEQ tablet [id number] Yes Take 1 tablet (20 mEq total) by mouth 2 (two) times daily for 3 days. Jolinda Norene M, DO  Active   rosuvastatin  (CRESTOR ) 20 MG tablet [id number] Yes Take 1 tablet (20 mg total) by mouth daily. Rashid, Farhan, MD  Active   sertraline  (ZOLOFT ) 50 MG tablet [id number] Yes TAKE 1 TABLET BY MOUTH EVERY DAY Hawks, Christy A, FNP  Active Self, Child, Pharmacy Records  tamsulosin  (FLOMAX ) 0.4 MG CAPS capsule [id number] Yes Take 1 capsule (0.4 mg total) by mouth daily after supper. Rashid, Farhan, MD  Active   traZODone  (DESYREL ) 50 MG tablet [id number] Yes TAKE 0.5-1 TABLETS BY MOUTH AT BEDTIME AS NEEDED FOR SLEEP. Jolinda Norene HERO, DO  Active Self, Child, Pharmacy Records            Home Care and Equipment/Supplies: Were Home Health Services Ordered?: Yes Name of Home Health  Agency:: [facility] Has Agency set up a time to come to your home?: Yes First Home Health Visit Date: 10/17/23  Functional Questionnaire: Do you need assistance with meal preparation?: No Do you need assistance with eating?: No Do you have difficulty maintaining continence: No Do you need assistance with getting out of bed/getting out of a chair/moving?: No Do you have difficulty  managing or taking your medications?: No  Follow up appointments reviewed: PCP Follow-up appointment confirmed?: Yes Date of PCP follow-up appointment?: 10/16/23 Follow-up Provider: Norene Fielding Specialist [facility] Follow-up appointment confirmed?: No Reason Specialist Follow-Up Not Confirmed: Patient has Specialist Provider Number and will Call for Appointment Do you need transportation to your follow-up appointment?: No Do you understand care options if your condition(s) worsen?: Yes-patient verbalized understanding  SDOH Interventions Today    Flowsheet Row Most Recent Value  SDOH Interventions   Food Insecurity Interventions Intervention Not Indicated  Housing Interventions Intervention Not Indicated  Transportation Interventions Intervention Not Indicated  Utilities Interventions Intervention Not Indicated    Mliss Creed [facility], BSN RN Care Manager/ Transition of Care [place] Population Health [phone]

## 2023-10-18 ENCOUNTER — Ambulatory Visit

## 2023-10-18 DIAGNOSIS — I6522 Occlusion and stenosis of left carotid artery: Secondary | ICD-10-CM

## 2023-10-18 DIAGNOSIS — I700 Atherosclerosis of aorta: Secondary | ICD-10-CM

## 2023-10-18 DIAGNOSIS — I5032 Chronic diastolic (congestive) heart failure: Secondary | ICD-10-CM

## 2023-10-18 DIAGNOSIS — I110 Hypertensive heart disease with heart failure: Secondary | ICD-10-CM | POA: Diagnosis not present

## 2023-10-18 DIAGNOSIS — M4856XD Collapsed vertebra, not elsewhere classified, lumbar region, subsequent encounter for fracture with routine healing: Secondary | ICD-10-CM

## 2023-10-18 DIAGNOSIS — I6521 Occlusion and stenosis of right carotid artery: Secondary | ICD-10-CM | POA: Diagnosis not present

## 2023-10-18 DIAGNOSIS — M4854XD Collapsed vertebra, not elsewhere classified, thoracic region, subsequent encounter for fracture with routine healing: Secondary | ICD-10-CM

## 2023-10-18 DIAGNOSIS — I890 Lymphedema, not elsewhere classified: Secondary | ICD-10-CM

## 2023-10-18 DIAGNOSIS — J439 Emphysema, unspecified: Secondary | ICD-10-CM

## 2023-10-18 DIAGNOSIS — I3481 Nonrheumatic mitral (valve) annulus calcification: Secondary | ICD-10-CM

## 2023-10-18 DIAGNOSIS — I2510 Atherosclerotic heart disease of native coronary artery without angina pectoris: Secondary | ICD-10-CM

## 2023-10-18 DIAGNOSIS — G8929 Other chronic pain: Secondary | ICD-10-CM

## 2023-10-21 ENCOUNTER — Telehealth: Payer: Self-pay | Admitting: Internal Medicine

## 2023-10-21 NOTE — Telephone Encounter (Signed)
 Inbound call from patient stating he received a latter from [facility] laboratories, where polyps were sent, stating he has no insurance. Requesting a call back to discuss further. Please advise, thank you

## 2023-10-21 NOTE — Telephone Encounter (Signed)
 Patient is advised to contact [facility] at the number provided on his letter and provide them with his current insurance information. They should be able to resubmit any claims to his current insurance at that time. He verbalizes understanding.

## 2023-10-23 DIAGNOSIS — I890 Lymphedema, not elsewhere classified: Secondary | ICD-10-CM | POA: Diagnosis not present

## 2023-10-23 NOTE — Discharge Summary (Signed)
 Discharge Summary     Eric Cook Nov 16, 1945 78 y.o. male  [id number]  Admission Date: 10/14/2023  Discharge Date: 10/15/2023  Physician: Fonda Rim, MD  Admission Diagnosis: Stenosis of left carotid artery [I65.22] Carotid artery stenosis [I65.29] Symptomatic carotid artery stenosis, left [I65.22]  Discharge Day Diagnosis: Stenosis of left carotid artery [I65.22] Carotid artery stenosis [I65.29] Symptomatic carotid artery stenosis, left [facility]  Hospital Course:  The patient was admitted to the hospital and taken to the operating room on 10/14/2023 and underwent left TCAR.  The pt tolerated the procedure well and was transported to the PACU in good condition.  By POD 1, the pt neuro status was intact.  His incision was intact without hematoma.  Hemoglobin was stable at 12.5.  He was tolerating a normal diet without swallowing difficulty, voiding without difficulty, and ambulating at baseline.  He was discharged home on POD 1.   No results for input(s): NA, K, CL, CO2, GLUCOSE, BUN, CALCIUM  in the last 72 hours.  Invalid input(s): CRATININE No results for input(s): WBC, HGB, HCT, PLT in the last 72 hours. No results for input(s): INR in the last 72 hours.   Discharge Instructions     Call MD for:  redness, tenderness, or signs of infection (pain, swelling, redness, odor or green/yellow discharge around incision site)   Complete by: As directed    Call MD for:  severe uncontrolled pain   Complete by: As directed    Call MD for:  temperature >100.4   Complete by: As directed    Diet - low sodium heart healthy   Complete by: As directed    Discharge wound care:   Complete by: As directed    Wash your incision daily with soap and water , then pat dry   Increase activity slowly   Complete by: As directed        Discharge Diagnosis:  Stenosis of left carotid artery [I65.22] Carotid artery stenosis [I65.29] Symptomatic carotid  artery stenosis, left [I65.22]  Secondary Diagnosis: Patient Active Problem List   Diagnosis Date Noted   Carotid artery stenosis 10/14/2023   Symptomatic carotid artery stenosis, left 10/14/2023   TIA (transient ischemic attack) 10/07/2023   Hypokalemia 08/04/2022   Dyslipidemia 08/04/2022   Acute on chronic diastolic CHF (congestive heart failure) (HCC) 08/03/2022   COPD (chronic obstructive pulmonary disease) (HCC) 08/03/2022   Chronic congestive heart failure with left ventricular diastolic dysfunction (HCC) 08/03/2022   Partial symptomatic epilepsy with complex partial seizures, intractable, without status epilepticus (HCC) 12/01/2019   Urine frequency 11/09/2019   Diarrhea 11/09/2019   Closed compression fracture of L3 lumbar vertebra, initial encounter (HCC) 08/25/2019   Acute metabolic encephalopathy 08/24/2019   HTN (hypertension) 08/23/2019   BPH (benign prostatic hyperplasia) 08/23/2019   GAD (generalized anxiety disorder) 08/23/2019   Acute confusional state 08/23/2019   Past Medical History:  Diagnosis Date   Anxiety    Chronic back pain    lumbar   COPD (chronic obstructive pulmonary disease) (HCC)    HLD (hyperlipidemia)    Hyperlipidemia    Hypertension    Peptic ulcer    Seizures (HCC)    medication related   Stroke [facility])     Allergies as of 10/15/2023   No Known Allergies      Medication List     TAKE these medications    acetaminophen  500 MG tablet Commonly known as: TYLENOL  Take 500 mg by mouth as needed for moderate pain or mild pain (As Needed).  aspirin  EC 81 MG tablet Take 1 tablet (81 mg total) by mouth daily. Swallow whole.   clopidogrel  75 MG tablet Commonly known as: PLAVIX  Take 1 tablet (75 mg total) by mouth daily.   furosemide  20 MG tablet Commonly known as: LASIX  TAKE 1 TABLET (20 MG TOTAL) BY MOUTH DAILY AS NEEDED FOR FLUID.   levETIRAcetam  500 MG 24 hr tablet Commonly known as: KEPPRA  XR Take 1 tablet (500 mg total) by  mouth at bedtime.   metoprolol  tartrate 25 MG tablet Commonly known as: LOPRESSOR  Take 1 tablet (25 mg total) by mouth 2 (two) times daily.   oxyCODONE  5 MG immediate release tablet Commonly known as: Oxy IR/ROXICODONE  Take 1 tablet (5 mg total) by mouth every 6 (six) hours as needed for moderate pain (pain score 4-6).   pantoprazole  40 MG tablet Commonly known as: Protonix  Take 1 tablet (40 mg total) by mouth daily.   rosuvastatin  20 MG tablet Commonly known as: CRESTOR  Take 1 tablet (20 mg total) by mouth daily.   sertraline  50 MG tablet Commonly known as: ZOLOFT  TAKE 1 TABLET BY MOUTH EVERY DAY   tamsulosin  0.4 MG Caps capsule Commonly known as: FLOMAX  Take 1 capsule (0.4 mg total) by mouth daily after supper.   traZODone  50 MG tablet Commonly known as: DESYREL  TAKE 0.5-1 TABLETS BY MOUTH AT BEDTIME AS NEEDED FOR SLEEP.               Discharge Care Instructions  (From admission, onward)           Start     Ordered   10/15/23 0000  Discharge wound care:       Comments: Wash your incision daily with soap and water , then pat dry   10/15/23 0746             Discharge Instructions:   [facility] Discharge Instructions Carotid Revascularization  Please refer to the following instructions for your post-procedure care. Your surgeon or physician assistant will discuss any changes with you.  Activity  You are encouraged to walk as much as you can. You can slowly return to normal activities but must avoid strenuous activity and heavy lifting until your doctor tell you it's OK. Avoid activities such as vacuuming or swinging a golf club. You can drive after one week if you are comfortable and you are no longer taking prescription pain medications. It is normal to feel tired for serval weeks after your surgery. It is also normal to have difficulty with sleep habits, eating, and bowel movements after surgery. These will go away  with time.  Bathing/Showering  You may shower after you come home. Do not soak in a bathtub, hot tub, or swim until the incision heals completely.  Incision Care  Shower every day. Clean your incision with mild soap and water . Pat the area dry with a clean towel. You do not need a bandage unless otherwise instructed. Do not apply any ointments or creams to your incision. You may have skin glue on your incision. Do not peel it off. It will come off on its own in about one week. Your incision may feel thickened and raised for several weeks after your surgery. This is normal and the skin will soften over time. For Men Only: It's OK to shave around the incision but do not shave the incision itself for 2 weeks. It is common to have numbness under your chin that could last for several months.  Diet  Resume your normal diet. There are no special food restrictions following this procedure. A low fat/low cholesterol diet is recommended for all patients with vascular disease. In order to heal from your surgery, it is CRITICAL to get adequate nutrition. Your body requires vitamins, minerals, and protein. Vegetables are the best source of vitamins and minerals. Vegetables also provide the perfect balance of protein. Processed food has little nutritional value, so try to avoid this.  Medications  Resume taking all of your medications unless your doctor or physician assistant tells you not to.  If your incision is causing pain, you may take over-the- counter pain relievers such as acetaminophen  (Tylenol ). If you were prescribed a stronger pain medication, please be aware these medications can cause nausea and constipation.  Prevent nausea by taking the medication with a snack or meal. Avoid constipation by drinking plenty of fluids and eating foods with a high amount of fiber, such as fruits, vegetables, and grains. Do not take Tylenol  if you are taking prescription pain medications.  Follow Up  Our office  will schedule a follow up appointment 2-3 weeks following discharge.  Please call us  immediately for any of the following conditions  Increased pain, redness, drainage (pus) from your incision site. Fever of 101 degrees or higher. If you should develop stroke (slurred speech, difficulty swallowing, weakness on one side of your body, loss of vision) you should call 911 and go to the nearest emergency room.  Reduce your risk of vascular disease:  Stop smoking. If you would like help call [facility] at 1-800-QUIT-NOW (802-512-0253) or [place] at (340) [phone]. Manage your cholesterol Maintain a desired weight Control your diabetes Keep your blood pressure down  If you have any questions, please call the office at [phone].  Disposition: Home  Patient's condition: is Excellent  Follow up: 1. [facility] in 4 weeks with carotid duplex   Ahmed Holster, PA-C [facility] [phone]   --- For [facility] Registry use ---   Modified Rankin score at D/C (0-6): 0  IV medication needed for:  1. Hypertension: No 2. Hypotension: No  Post-op Complications: No  1. Post-op CVA or TIA: No  If yes: Event classification (right eye, left eye, right cortical, left cortical, verterobasilar, other):   If yes: Timing of event (intra-op, <6 hrs post-op, >=6 hrs post-op, unknown):   2. CN injury: No  If yes: CN  injuried   3. Myocardial infarction: No  If yes: Dx by (EKG or clinical, Troponin):   4.  CHF: No  5.  Dysrhythmia (new): No  6. Wound infection: No  7. Reperfusion symptoms: No  8. Return to OR: No  If yes: return to OR for (bleeding, neurologic, other CEA incision, other):   Discharge medications: Statin use:  Yes ASA use:  Yes   Beta blocker use:  Yes ACE-Inhibitor use:  No  ARB use:  No CCB use: No P2Y12 Antagonist use: Yes, [x ] Plavix , [ ]  Plasugrel, [ ]  Ticlopinine, [ ]  Ticagrelor, [ ]  Other, [ ]  No for medical reason, [ ]  Non-compliant, [ ]   Not-indicated Anti-coagulant use:  No, [ ]  Warfarin, [ ]  Rivaroxaban, [ ]  Dabigatran,

## 2023-10-24 ENCOUNTER — Other Ambulatory Visit: Payer: Self-pay | Admitting: *Deleted

## 2023-10-24 DIAGNOSIS — I6522 Occlusion and stenosis of left carotid artery: Secondary | ICD-10-CM

## 2023-10-30 DIAGNOSIS — I700 Atherosclerosis of aorta: Secondary | ICD-10-CM | POA: Diagnosis not present

## 2023-11-06 ENCOUNTER — Telehealth: Payer: Self-pay | Admitting: Family Medicine

## 2023-11-06 ENCOUNTER — Telehealth: Payer: Self-pay | Admitting: *Deleted

## 2023-11-06 ENCOUNTER — Ambulatory Visit: Admitting: Adult Health

## 2023-11-06 ENCOUNTER — Encounter: Payer: Self-pay | Admitting: Adult Health

## 2023-11-06 VITALS — BP 146/79 | HR 61 | Ht 69.0 in | Wt 203.0 lb

## 2023-11-06 DIAGNOSIS — Z9862 Peripheral vascular angioplasty status: Secondary | ICD-10-CM | POA: Diagnosis not present

## 2023-11-06 DIAGNOSIS — G459 Transient cerebral ischemic attack, unspecified: Secondary | ICD-10-CM

## 2023-11-06 DIAGNOSIS — G40219 Localization-related (focal) (partial) symptomatic epilepsy and epileptic syndromes with complex partial seizures, intractable, without status epilepticus: Secondary | ICD-10-CM | POA: Diagnosis not present

## 2023-11-06 MED ORDER — METOPROLOL TARTRATE 25 MG PO TABS: 25.0000 mg | ORAL_TABLET | Freq: Two times a day (BID) | ORAL | 0 refills | Status: DC

## 2023-11-06 MED ORDER — LEVETIRACETAM ER 500 MG PO TB24: 500.0000 mg | ORAL_TABLET | Freq: Every day | ORAL | 3 refills | Status: AC

## 2023-11-06 MED ORDER — CLOPIDOGREL BISULFATE 75 MG PO TABS: 75.0000 mg | ORAL_TABLET | Freq: Every day | ORAL | 0 refills | Status: DC

## 2023-11-06 MED ORDER — ROSUVASTATIN CALCIUM 20 MG PO TABS: 20.0000 mg | ORAL_TABLET | Freq: Every day | ORAL | 0 refills | Status: DC

## 2023-11-06 MED ORDER — ASPIRIN 81 MG PO TBEC: 81.0000 mg | DELAYED_RELEASE_TABLET | Freq: Every day | ORAL | 0 refills | Status: DC

## 2023-11-06 NOTE — Telephone Encounter (Signed)
 FYI VM from Denis PT w/ [facility] Pt had 1 more visit for discharge for PT, pt refused, said he did not need, he is driving and getting around fine.

## 2023-11-06 NOTE — Progress Notes (Signed)
 [facility] [address] [place]. [place] [address] [phone]       OFFICE FOLLOW UP NOTE  [name] Date of Birth:  09-03-1945 Medical Record Number:  [id number]    Primary neurologist: Dr. [name] Reason for visit: HFU - TIA, hx of Seizure     Chief Complaint  Patient presents with   Seizures    Rm 3 alone  Pt is well and stable, reports no sz or stroke concerns.        HPI:    Update 11/06/2023 JM: Patient returns for sooner scheduled visit for recent hospital follow-up.  He presented to ED on 9/13 after transient episode of aphasia, right hand weakness, right leaning and confusion.  Symptoms resolved on ED assessment. CT head and MRI brain negative for acute abnormality.  CTA head/neck right ICA with string sign stenosis and left ICA with high-grade stenosis estimated at 74%.  Suspected symptoms in setting of left brain TIA secondary to left ICA stenosis.  Placed on DAPT and scheduled for ICA intervention 9/29.  Continued on Keppra  for seizure prevention.  LDL 107, switch simvastatin  to Crestor  20 mg daily.  A1c 5.0.  Currently, he has been stable without any recurrent or new stroke/TIA symptoms.  He has returned back to prior activities without difficulty.  He remains on aspirin  and Plavix  as well as Crestor  without side effects.  Has follow-up with vascular surgery next week. Blood pressure routinely monitored at home and typically 130s/80s.  Has since had follow-up with PCP and scheduled for follow-up visit in January.  He remains on Keppra  without side effects and denies any seizure activity.  No questions or concerns at this time.       History provided for reference purposes only Update 02/20/2023 JM: Patient returns for 1 year seizure follow-up via MyChart video visit.  Has been stable without any recurrent seizure activity.  Compliant on Keppra  XR 500 mg daily, denies side effects.  Continues to maintain ADLs and IADLs independently as  well as driving.  Routinely follows with PCP, lab work within the past year satisfactory.  He has been having lower back issues recently, being followed by orthopedics.   Update 02/22/2022 JM: Patient returns for 1 year seizure follow-up unaccompanied.  Denies any seizure activity.  Compliant on Keppra  XR 500mg  daily. Tolerating well.  Maintains ADLs and IADLs independently as well as driving.  Lab work routinely monitored by PCP. No concerns at this time.   Update 02/22/2021 JM: returns for 6 month seizure follow up unaccompanied.  Overall doing well.  Has not had any seizure activity.  Compliant on Keppra  XR 500 mg daily without side effects.  Remains independent with ADLs and IADLs as well as driving.  CBC and CMP completed 08/2020 unremarkable.  No new concerns at this time.  Update 08/22/2020 JM: Mr. Bergfeld returns for 70-month seizure follow-up accompanied by his son.  He has been doing well since prior visit without any seizure activity and Keppra  250 mg twice daily tolerating without side effects.  He does question possibility of stopping Keppra .  Repeat EEG 02/2020 normal without evidence of seizure activity.  He has since returned back to driving without any difficulty.  Maintains ADLs and IADLs independently.  He routinely follows with his PCP Dr. Jolinda every 3 to 4 months - has appt tomorrow and believes routine lab work will be completed at that time.  No further concerns at this time.  Update 02/22/2020 JM: Mr. [name]  returns for seizure follow-up accompanied by his son.  He has remained on Keppra  250 mg twice daily tolerating well without side effects and has not had any additional seizure type activity.  Recommended repeat EEG not yet completed -patient and son thought today's visit was to have this completed.  He questions return back to driving as it has been 6 months since any seizure activity.  No concerns at this time.  Initial consult visit 11/10/2019 Dr. [name]: Eric Cook is a 78 year old  Caucasian male with past medical history of hypertension, hyperlipidemia and anxiety who is seen today for initial office consultation visit for new onset seizures.  He is accompanied by his son Eric Cook today.  History is obtained from them, review of electronic medical records and I personally reviewed available imaging films in PACS.  Patient had an episode on 08/24/2019 of sudden onset of speech difficulties.  He blames this on having stopped Xanax  suddenly which she had been taking long-term 2 days prior.  He had also been unable to sleep the night before.  His son noticed that his speech did not make sense.  The patient called his son over the phone and kept on repeating saying I want to kill [name].  And the son came to his house and noticed him to be confused he was also haddock involuntary rubbing movement of his hands automatically.  He appeared disoriented and had word finding difficulties and trouble completing sentences.  These symptoms lasted about 24 hours and gradually improve the next day.  He was admitted to [facility] where MRI scan of the brain showed no acute abnormality but showed mild generalized atrophy and old left basal ganglia lacune.  EEG on 08/24/2019 showed focal left frontotemporal slowing in the 3 to 6 Hz range.  CBC, BMP and urine drug screen were negative except for benzos.  Patient was seen by Dr. [name] neurologist who had strong suspicion for complex partial seizure and started up with patient on Keppra .  Patient has been taking Keppra  250 mg twice daily which seems to be tolerating well and has had no further episodes like this since he left the hospital.  He is been having some pain in his pelvis related to his prostate and has an upcoming appointment to see a urologist for that.  He denies any prior history of seizures, significant head injury with loss of consciousness, strokes or TIA.  There is no family history of epilepsy.  He denies drinking alcohol, using marijuana or  drugs of abuse.  ROS:   14 system review of systems is positive for those listed in HPI and all other systems negative  PMH:  Past Medical History:  Diagnosis Date   Anxiety    Chronic back pain    lumbar   COPD (chronic obstructive pulmonary disease) (HCC)    HLD (hyperlipidemia)    Hyperlipidemia    Hypertension    Peptic ulcer    Seizures (HCC)    medication related   Stroke [facility])     Social History:  Social History   Socioeconomic History   Marital status: Single    Spouse name: Not on file   Number of children: 1   Years of education: Not on file   Highest education level: Some college, no degree  Occupational History   Occupation: Lobbyist    Comment: part time  Tobacco Use   Smoking status: Former    Current packs/day: 0.00    Average packs/day:  1 pack/day for 54.0 years (54.0 ttl pk-yrs)    Types: Cigarettes    Start date: 04/1965    Quit date: 04/2019    Years since quitting: 4.5   Smokeless tobacco: Never  Vaping Use   Vaping status: Never Used  Substance and Sexual Activity   Alcohol use: Never   Drug use: Never   Sexual activity: Never  Other Topics Concern   Not on file  Social History Narrative   Lives alone   Right Handed   Drinks 2-3 cups of caffeine daily   Son in [place]   Social Drivers of Health   Financial Resource Strain: Patient Declined (07/29/2023)   Overall Financial Resource Strain (CARDIA)    Difficulty of Paying Living Expenses: Patient declined  Food Insecurity: No Food Insecurity (10/17/2023)   Hunger Vital Sign    Worried About Running Out of Food in the Last Year: Never true    Ran Out of Food in the Last Year: Never true  Transportation Needs: No Transportation Needs (10/17/2023)   PRAPARE - Administrator, Civil Service (Medical): No    Lack of Transportation (Non-Medical): No  Physical Activity: Sufficiently Active (07/29/2023)   Exercise Vital Sign    Days of Exercise per Week: 4 days     Minutes of Exercise per Session: 60 min  Stress: No Stress Concern Present (07/29/2023)   [employer] of Occupational Health - Occupational Stress Questionnaire    Feeling of Stress: Only a little  Social Connections: Unknown (10/15/2023)   Social Connection and Isolation Panel    Frequency of Communication with Friends and Family: More than three times a week    Frequency of Social Gatherings with Friends and Family: Twice a week    Attends Religious Services: Not on Marketing executive or Organizations: Not on file    Attends Banker Meetings: Not on file    Marital Status: Not on file  Recent Concern: Social Connections - Socially Isolated (10/06/2023)   Social Connection and Isolation Panel    Frequency of Communication with Friends and Family: More than three times a week    Frequency of Social Gatherings with Friends and Family: Twice a week    Attends Religious Services: Patient declined    Database administrator or Organizations: No    Attends Engineer, structural: Not on file    Marital Status: Divorced  Intimate Partner Violence: Not At Risk (10/17/2023)   Humiliation, Afraid, Rape, and Kick questionnaire    Fear of Current or Ex-Partner: No    Emotionally Abused: No    Physically Abused: No    Sexually Abused: No    Medications:   Current Outpatient Medications on File Prior to Visit  Medication Sig Dispense Refill   acetaminophen  (TYLENOL ) 500 MG tablet Take 500 mg by mouth as needed for moderate pain or mild pain (As Needed).     aspirin  EC 81 MG tablet Take 1 tablet (81 mg total) by mouth daily. Swallow whole. 30 tablet 0   clopidogrel  (PLAVIX ) 75 MG tablet Take 1 tablet (75 mg total) by mouth daily. 30 tablet 0   furosemide  (LASIX ) 20 MG tablet TAKE 1 TABLET (20 MG TOTAL) BY MOUTH DAILY AS NEEDED FOR FLUID. 90 tablet 0   metoprolol  tartrate (LOPRESSOR ) 25 MG tablet Take 1 tablet (25 mg total) by mouth 2 (two) times daily. 60  tablet 0   pantoprazole  (PROTONIX ) 40 MG  tablet Take 1 tablet (40 mg total) by mouth daily. 15 tablet 0   rosuvastatin  (CRESTOR ) 20 MG tablet Take 1 tablet (20 mg total) by mouth daily. 30 tablet 0   sertraline  (ZOLOFT ) 50 MG tablet TAKE 1 TABLET BY MOUTH EVERY DAY 90 tablet 2   tamsulosin  (FLOMAX ) 0.4 MG CAPS capsule Take 1 capsule (0.4 mg total) by mouth daily after supper. 30 capsule 0   traZODone  (DESYREL ) 50 MG tablet TAKE 0.5-1 TABLETS BY MOUTH AT BEDTIME AS NEEDED FOR SLEEP. 90 tablet 3   No current facility-administered medications on file prior to visit.    Allergies:  No Known Allergies  Physical Exam Today's Vitals   11/06/23 0900  BP: (!) 146/79  Pulse: 61  Weight: 203 lb (92.1 kg)  Height: 5' 9 (1.753 m)   Body mass index is 29.98 kg/m.  General: well developed, well nourished, pleasant elderly Caucasian male, seated, in no evident distress  Neurologic Exam Mental Status: Awake and fully alert. Oriented to place and time. Recent and remote memory intact. Attention span, concentration and fund of knowledge appropriate. Mood and affect appropriate.  Cranial Nerves: Pupils equal, briskly reactive to light. Extraocular movements full without nystagmus. Visual fields full to confrontation.  HOH bilaterally. Facial sensation intact. Face, tongue, palate moves normally and symmetrically.  Motor: Normal bulk and tone. Normal strength in all tested extremity muscles Sensory.: intact to touch , pinprick , position and vibratory sensation.  Coordination: Rapid alternating movements normal in all extremities. Finger-to-nose and heel-to-shin performed accurately bilaterally. Gait and Station: Arises from chair without difficulty. Stance is normal. Gait demonstrates normal stride length and balance without use of AD.         ASSESSMENT/PLAN: 78 year old Caucasian male with recurrent transient episodes of speech disturbance, automatisms and confusion disorientation likely  complex partial seizures on 08/23/2019.  Unremarkable brain imaging but abnormal EEG showing focal left frontotemporal slowing.  Transient episode of aphasia, right hand weakness, right sided leaning and confusion on 10/05/2023, felt to be in setting of left brain TIA secondary to left ICA stenosis s/p TCAR 9/22.     TIA S/p L TCAR - no recurrent stroke/TIA symptoms - Remains on aspirin  and Plavix  with duration determined by VVS - f/u with VVS as scheduled on 10/23 - Continue Crestor  20 mg daily - Advised to continue close follow-up with PCP for stroke risk factor management   3. Partial symptomatic epilepsy with complex partial seizures -Continue Keppra  XR 500mg  nightly - refill provided -routine lab work by PCP  -EEG 08/24/2019 cortical dysfunction in the left frontotemporal region possibly postictal state -EEG 03/07/2020 normal without evidence of seizures -advised to avoid seizure provoking triggers like abrupt medication discontinuation, medication noncompliance, alcohol, extremes of activity and exertion and abrupt changes in diet      Follow-up in 1 year or call earlier if needed   CC:  Jolinda Potter M, DO   I personally spent a total of 43 minutes in the care of the patient today including preparing to see the patient, getting/reviewing separately obtained history, performing a medically appropriate exam/evaluation, counseling and educating, placing orders, and documenting clinical information in the EHR.   Harlene Bogaert, AGNP-BC  [facility] [address] [place], [place] [address]  Phone [phone] Fax [phone] Note: This document was prepared with digital dictation and possible smart phrase technology. Any transcriptional errors that result from this process are unintentional.

## 2023-11-06 NOTE — Telephone Encounter (Signed)
 Copied from CRM #[id number]. Topic: Clinical - Medication Refill >> Nov 06, 2023  1:37 PM Carla L wrote: Medication: aspirin  ec 81 mg tablet Clopidogrel  (plavix ) 75 mg tablet rosuvastatin  (CRESTOR ) 20 MG tablet metoprolol  tartrate (LOPRESSOR ) 25 MG tablet   Patient states medications were refilled by hospital when he was admitted. Patient states he is due for refill soon and requesting for Dr. Jolinda to refill prescriptions and send to pharmacy below.  Has the patient contacted their pharmacy? Yes [id number] - [place], [place] - [address] [place] [address] Phone: [phone] Fax: [phone]  Is this the correct pharmacy for this prescription? Yes   Has the prescription been filled recently? No  Is the patient out of the medication? No  Has the patient been seen for an appointment in the last year OR does the patient have an upcoming appointment? Yes  Can we respond through MyChart? No  Agent: Please be advised that Rx refills may take up to 3 business days. We ask that you follow-up with your pharmacy.

## 2023-11-06 NOTE — Telephone Encounter (Signed)
 Patient actually sees a cardiologist, Dr. Vernice, whom he is overdue for follow-up with.  I went ahead and renewed all of the medications but please make sure that he has appropriate follow-up with his heart specialist.

## 2023-11-06 NOTE — Patient Instructions (Signed)
 Continue Keppra  XR 500 mg daily for seizure prevention  Continue aspirin  81 mg daily  and Crestor  for secondary stroke prevention  Follow-up with vascular surgery next week, they will determine ongoing duration of Plavix   Continue to follow up with PCP regarding blood pressure and cholesterol management  Maintain strict control of hypertension with blood pressure goal below 130/90 and cholesterol with LDL cholesterol (bad cholesterol) goal below 70 mg/dL.   Signs of a Stroke? Follow the BEFAST method:  Balance Watch for a sudden loss of balance, trouble with coordination or vertigo Eyes Is there a sudden loss of vision in one or both eyes? Or double vision?  Face: Ask the person to smile. Does one side of the face droop or is it numb?  Arms: Ask the person to raise both arms. Does one arm drift downward? Is there weakness or numbness of a leg? Speech: Ask the person to repeat a simple phrase. Does the speech sound slurred/strange? Is the person confused ? Time: If you observe any of these signs, call 911.     Followup in the future with me in 1 year or call earlier if needed       Thank you for coming to see us  at [facility]. I hope we have been able to provide you high quality care today.  You may receive a patient satisfaction survey over the next few weeks. We would appreciate your feedback and comments so that we may continue to improve ourselves and the health of our patients.

## 2023-11-14 ENCOUNTER — Ambulatory Visit: Payer: Self-pay

## 2023-11-14 ENCOUNTER — Other Ambulatory Visit: Payer: Self-pay

## 2023-11-14 ENCOUNTER — Ambulatory Visit: Admitting: Vascular Surgery

## 2023-11-14 ENCOUNTER — Encounter: Payer: Self-pay | Admitting: Vascular Surgery

## 2023-11-14 ENCOUNTER — Ambulatory Visit (HOSPITAL_COMMUNITY)
Admission: RE | Admit: 2023-11-14 | Discharge: 2023-11-14 | Disposition: A | Discharge status: Home / Self Care | Source: Ambulatory Visit | Attending: Vascular Surgery | Admitting: Vascular Surgery

## 2023-11-14 VITALS — BP 193/81 | HR 63 | Temp 98.0°F | Resp 18 | Ht 69.0 in | Wt 203.0 lb

## 2023-11-14 DIAGNOSIS — I6521 Occlusion and stenosis of right carotid artery: Secondary | ICD-10-CM | POA: Diagnosis not present

## 2023-11-14 DIAGNOSIS — Z9889 Other specified postprocedural states: Secondary | ICD-10-CM

## 2023-11-14 DIAGNOSIS — I6522 Occlusion and stenosis of left carotid artery: Secondary | ICD-10-CM | POA: Insufficient documentation

## 2023-11-14 DIAGNOSIS — Z95828 Presence of other vascular implants and grafts: Secondary | ICD-10-CM | POA: Diagnosis not present

## 2023-11-14 NOTE — Progress Notes (Signed)
 Office Note     HPI: Eric Cook is a 78 y.o. (Oct 10, 1945) male presenting in follow-up status post 10/14/2023 left TCAR for symptomatic ICA stenosis.  On exam, Eric Cook was doing well, accompanied by his son.  His wife, who also recently had surgery was also doing well. Eric Cook denies new TIA, stroke, amaurosis.  As discussed while he was inpatient, he has severe, asymptomatic, right-sided ICA stenosis as well.  The pt is  on a statin for cholesterol management.  The pt is  on a daily aspirin .   Other AC:  plavix  The pt is  on medication for hypertension.   The pt is  not diabetic.  Tobacco hx:  former  Past Medical History:  Diagnosis Date   Anxiety    Carotid artery occlusion    Chronic back pain    lumbar   COPD (chronic obstructive pulmonary disease) (HCC)    HLD (hyperlipidemia)    Hyperlipidemia    Hypertension    Peptic ulcer    Seizures (HCC)    medication related   Stroke [facility])     Past Surgical History:  Procedure Laterality Date   COLONOSCOPY     LUMBAR DISC SURGERY     L3-L4   TRANSCAROTID ARTERY REVASCULARIZATION  Left 10/14/2023   Procedure: LEFT TRANSCAROTID ARTERY REVASCULARIZATION;  Surgeon: Lanis Fonda BRAVO, MD;  Location: [facility] OR;  Service: Vascular;  Laterality: Left;   ULTRASOUND GUIDANCE FOR VASCULAR ACCESS Left 10/14/2023   Procedure: ULTRASOUND GUIDANCE, FOR VASCULAR ACCESS;  Surgeon: Lanis Fonda BRAVO, MD;  Location: [facility] OR;  Service: Vascular;  Laterality: Left;   UPPER GASTROINTESTINAL ENDOSCOPY      Social History   Socioeconomic History   Marital status: Single    Spouse name: Not on file   Number of children: 1   Years of education: Not on file   Highest education level: Some college, no degree  Occupational History   Occupation: Lobbyist    Comment: part time  Tobacco Use   Smoking status: Former    Current packs/day: 0.00    Average packs/day: 1 pack/day for 54.0 years (54.0 ttl pk-yrs)    Types: Cigarettes    Start  date: 04/1965    Quit date: 04/2019    Years since quitting: 4.5   Smokeless tobacco: Never  Vaping Use   Vaping status: Never Used  Substance and Sexual Activity   Alcohol use: Never   Drug use: Never   Sexual activity: Never  Other Topics Concern   Not on file  Social History Narrative   Lives alone   Right Handed   Drinks 2-3 cups of caffeine daily   Son in [place]   Social Drivers of Health   Financial Resource Strain: Patient Declined (07/29/2023)   Overall Financial Resource Strain (CARDIA)    Difficulty of Paying Living Expenses: Patient declined  Food Insecurity: No Food Insecurity (10/17/2023)   Hunger Vital Sign    Worried About Running Out of Food in the Last Year: Never true    Ran Out of Food in the Last Year: Never true  Transportation Needs: No Transportation Needs (10/17/2023)   PRAPARE - Administrator, Civil Service (Medical): No    Lack of Transportation (Non-Medical): No  Physical Activity: Sufficiently Active (07/29/2023)   Exercise Vital Sign    Days of Exercise per Week: 4 days    Minutes of Exercise per Session: 60 min  Stress: No Stress Concern Present (07/29/2023)  [employer] of Occupational Health - Occupational Stress Questionnaire    Feeling of Stress: Only a little  Social Connections: Unknown (10/15/2023)   Social Connection and Isolation Panel    Frequency of Communication with Friends and Family: More than three times a week    Frequency of Social Gatherings with Friends and Family: Twice a week    Attends Religious Services: Not on Marketing executive or Organizations: Not on file    Attends Banker Meetings: Not on file    Marital Status: Not on file  Recent Concern: Social Connections - Socially Isolated (10/06/2023)   Social Connection and Isolation Panel    Frequency of Communication with Friends and Family: More than three times a week    Frequency of Social Gatherings with Friends and  Family: Twice a week    Attends Religious Services: Patient declined    Database administrator or Organizations: No    Attends Engineer, structural: Not on file    Marital Status: Divorced  Intimate Partner Violence: Not At Risk (10/17/2023)   Humiliation, Afraid, Rape, and Kick questionnaire    Fear of Current or Ex-Partner: No    Emotionally Abused: No    Physically Abused: No    Sexually Abused: No   Family History  Problem Relation Age of Onset   Colon polyps Brother 31   Colon cancer Brother    Colon polyps Son 41   Other Son        vertigo   Kidney Stones Son    Esophageal cancer Neg Hx    Pancreatic cancer Neg Hx    Stomach cancer Neg Hx    Rectal cancer Neg Hx     Current Outpatient Medications  Medication Sig Dispense Refill   acetaminophen  (TYLENOL ) 500 MG tablet Take 500 mg by mouth as needed for moderate pain or mild pain (As Needed).     aspirin  EC 81 MG tablet Take 1 tablet (81 mg total) by mouth daily. Swallow whole. 30 tablet 0   clopidogrel  (PLAVIX ) 75 MG tablet Take 1 tablet (75 mg total) by mouth daily. 30 tablet 0   furosemide  (LASIX ) 20 MG tablet TAKE 1 TABLET (20 MG TOTAL) BY MOUTH DAILY AS NEEDED FOR FLUID. 90 tablet 0   levETIRAcetam  (KEPPRA  XR) 500 MG 24 hr tablet Take 1 tablet (500 mg total) by mouth at bedtime. 90 tablet 3   metoprolol  tartrate (LOPRESSOR ) 25 MG tablet Take 1 tablet (25 mg total) by mouth 2 (two) times daily. 60 tablet 0   pantoprazole  (PROTONIX ) 40 MG tablet Take 1 tablet (40 mg total) by mouth daily. 15 tablet 0   rosuvastatin  (CRESTOR ) 20 MG tablet Take 1 tablet (20 mg total) by mouth daily. 30 tablet 0   sertraline  (ZOLOFT ) 50 MG tablet TAKE 1 TABLET BY MOUTH EVERY DAY 90 tablet 2   tamsulosin  (FLOMAX ) 0.4 MG CAPS capsule Take 1 capsule (0.4 mg total) by mouth daily after supper. 30 capsule 0   traZODone  (DESYREL ) 50 MG tablet TAKE 0.5-1 TABLETS BY MOUTH AT BEDTIME AS NEEDED FOR SLEEP. 90 tablet 3   No current  facility-administered medications for this visit.    No Known Allergies   REVIEW OF SYSTEMS:  [X]  denotes positive finding, [ ]  denotes negative finding Cardiac  Comments:  Chest pain or chest pressure:    Shortness of breath upon exertion:    Short of breath when lying flat:  Irregular heart rhythm:        Vascular    Pain in calf, thigh, or hip brought on by ambulation:    Pain in feet at night that wakes you up from your sleep:     Blood clot in your veins:    Leg swelling:         Pulmonary    Oxygen  at home:    Productive cough:     Wheezing:         Neurologic    Sudden weakness in arms or legs:     Sudden numbness in arms or legs:     Sudden onset of difficulty speaking or slurred speech:    Temporary loss of vision in one eye:     Problems with dizziness:         Gastrointestinal    Blood in stool:     Vomited blood:         Genitourinary    Burning when urinating:     Blood in urine:        Psychiatric    Major depression:         Hematologic    Bleeding problems:    Problems with blood clotting too easily:        Skin    Rashes or ulcers:        Constitutional    Fever or chills:      PHYSICAL EXAMINATION:  Vitals:   11/14/23 1428 11/14/23 1430  BP: (!) 193/88 (!) 193/81  Pulse: 63   Resp: 18   Temp: 98 F (36.7 C)   TempSrc: Temporal   SpO2: 97%   Weight: 203 lb (92.1 kg)   Height: 5' 9 (1.753 m)     General:  WDWN in NAD; vital signs documented above Gait: Not observed HENT: WNL, normocephalic Pulmonary: normal non-labored breathing , without wheezing Cardiac: regular HR Abdomen: soft, NT, no masses Skin: without rashes Vascular Exam/Pulses:  Right Left  Radial 2+ (normal) 2+ (normal)  Ulnar    Femoral    Popliteal    DP    PT     Extremities: without ischemic changes, without Gangrene , without cellulitis; without open wounds;  Musculoskeletal: no muscle wasting or atrophy  Neurologic: A&O X 3;  No focal weakness  or paresthesias are detected Psychiatric:  The pt has Normal affect.   Non-Invasive Vascular Imaging:   Right Carotid Findings:  +----------+-------+--------+--------+-----------------------+-------------  ----+           PSV    EDV cm/sStenosisPlaque Description     Comments                      cm/s                                                              +----------+-------+--------+--------+-----------------------+-------------  ----+  CCA Prox  49     9                                      intimal  thickening          +----------+-------+--------+--------+-----------------------+-------------  ----+  CCA Distal28     5                                      intimal                                                                     thickening          +----------+-------+--------+--------+-----------------------+-------------  ----+  ICA Prox  565    137     80-99%  calcific and                                                                heterogenous                               +----------+-------+--------+--------+-----------------------+-------------  ----+  ICA Mid   122    0       40-59%                                             +----------+-------+--------+--------+-----------------------+-------------  ----+  ICA Distal58     11                                                         +----------+-------+--------+--------+-----------------------+-------------  ----+  ECA      65     8               heterogenous                               +----------+-------+--------+--------+-----------------------+-------------  ----+   +----------+--------+-------+----------------+-------------------+           PSV cm/sEDV cmsDescribe        Arm Pressure (mmHG)   +----------+--------+-------+----------------+-------------------+  [id number]     0      Multiphasic, TWO829                  +----------+--------+-------+----------------+-------------------+   +---------+--------+--+--------+-+---------+  VertebralPSV cm/s33EDV cm/s7Antegrade  +---------+--------+--+--------+-+---------+      Left Carotid Findings:  +----------+--------+--------+--------+------------------+-----------------  -+           PSV cm/sEDV cm/sStenosisPlaque DescriptionComments             +----------+--------+--------+--------+------------------+-----------------  -+  CCA Prox  86      7  intimal  thickening  +----------+--------+--------+--------+------------------+-----------------  -+  CCA Distal54      6                                 intimal  thickening  +----------+--------+--------+--------+------------------+-----------------  -+  ICA Prox  41      9       1-39%                                          +----------+--------+--------+--------+------------------+-----------------  -+  ICA Mid   69      14                                                     +----------+--------+--------+--------+------------------+-----------------  -+  ICA Distal53      9                                                      +----------+--------+--------+--------+------------------+-----------------  -+  ECA      77      0               heterogenous                           +----------+--------+--------+--------+------------------+-----------------  -+   +----------+--------+--------+----------------+-------------------+           PSV cm/sEDV cm/sDescribe        Arm Pressure (mmHG)  +----------+--------+--------+----------------+-------------------+  Subclavian80     0       Multiphasic, TWO829                   +----------+--------+--------+----------------+-------------------+   +---------+--------+--+--------+--+---------+  VertebralPSV cm/s43EDV cm/s11Antegrade  +---------+--------+--+--------+--+---------+      Left Stent(s):  +----------------------------+--------+--------+------------+--------+-----  ----+  CCA distal to ICA distal    PSV cm/sEDV cm/sStenosis     WaveformComments   stent                                                                       +----------------------------+--------+--------+------------+--------+-----  ----+  Prox to Stent               48      8       <50%                                                                        stenosis                        +----------------------------+--------+--------+------------+--------+-----  ----+  Proximal Stent              54      6                                       +----------------------------+--------+--------+------------+--------+-----  ----+  Mid Stent                   41      9                                       +----------------------------+--------+--------+------------+--------+-----  ----+  Distal Stent                54      9                           ICA                                                                          distal     +----------------------------+--------+--------+------------+--------+-----  ----+   Unable to visualize the distal stent and distal to stent.    Summary:  Right Carotid: Velocities in the right ICA are consistent with a 80-99%                 stenosis. The ECA appears <50% stenosed.   Left Carotid: The ECA appears <50% stenosed. Patent left CCA distal to ICA                distal stent. <50% stenosis is seen within the stent. Unable  to               visualize distal stent and distal to stent.   Vertebrals:  Bilateral vertebral arteries demonstrate antegrade flow.   Subclavians: Normal flow hemodynamics were seen in bilateral subclavian               arteries.   *See table(s) above for measurements and observations.        ASSESSMENT/PLAN: MIKHAIL HALLENBECK is a 78 y.o. male presenting in follow-up status post 10/14/2023 left TCAR for symptomatic ICA stenosis.  He has recovered well.  The incision site has healed.  He has no new deficits.  Carotid duplex ultrasound was reviewed.  The stent is widely patent. Right sided internal carotid artery stenosis is critical-velocities over 500.  I am worried that waiting 3 months would result in stroke  During his visit today we discussed right sided carotid intervention for asymptomatic critical stenosis.  We discussed both carotid endarterectomy versus transcarotid artery stenting.  He elected to move forward with transcarotid artery stenting as he had a good experience with the operation roughly 1 month ago.  I would like for him to have the operation after at least 6 weeks to decrease the risk of hyperperfusion syndrome.   Fonda FORBES Rim, MD [facility] [phone]

## 2023-11-14 NOTE — Telephone Encounter (Signed)
 Patient is scheduled to see PCP for this issue tomorrow.

## 2023-11-14 NOTE — Telephone Encounter (Signed)
 FYI Only or Action Required?: FYI only for provider.  Patient was last seen in primary care on 10/16/2023 by Jolinda Norene HERO, DO.  Called Nurse Triage reporting Hypertension.  Symptoms began several weeks ago.  Interventions attempted: Prescription medications: metoprolol .  Symptoms are: unchanged.  Triage Disposition: See Physician Within 24 Hours  Patient/caregiver understands and will follow disposition?: Yes        Copied from CRM #[id number]. Topic: Clinical - Red Word Triage >> Nov 14, 2023  4:10 PM Teressa P wrote: Red Word that prompted transfer to Nurse Triage: Patient had a post surgery appt today and his blood pressure was high 190/80 and they told him to call his primary. Reason for Disposition  Systolic BP >= 180 OR Diastolic >= 110  Answer Assessment - Initial Assessment Questions 1. BLOOD PRESSURE: What is your blood pressure? Did you take at least two measurements 5 minutes apart?     190/80 - taken in office during post-op follow up appt-- pt advised to call PCP to see if Rx needs adjustment.  Pt currently at son's home, triager asked to repeat on home machine 1622: 189/78 2. ONSET: When did you take your blood pressure?     2-3 weeks 3. HOW: How did you take your blood pressure? (e.g., automatic home BP monitor, visiting nurse)     auto 4. HISTORY: Do you have a history of high blood pressure?     yes 5. MEDICINES: Are you taking any medicines for blood pressure? Have you missed any doses recently?     metoprolol  tartrate (LOPRESSOR ) 6. OTHER SYMPTOMS: Do you have any symptoms? (e.g., blurred vision, chest pain, difficulty breathing, headache, weakness)     N/a 7. PREGNANCY: Is there any chance you are pregnant? When was your last menstrual period?     N/a  Protocols used: Blood Pressure - High-A-AH

## 2023-11-15 ENCOUNTER — Ambulatory Visit (INDEPENDENT_AMBULATORY_CARE_PROVIDER_SITE_OTHER): Admitting: Family Medicine

## 2023-11-15 ENCOUNTER — Encounter: Payer: Self-pay | Admitting: Family Medicine

## 2023-11-15 VITALS — BP 162/69 | HR 57 | Temp 97.6°F | Ht 69.0 in | Wt 203.0 lb

## 2023-11-15 DIAGNOSIS — I10 Essential (primary) hypertension: Secondary | ICD-10-CM | POA: Diagnosis not present

## 2023-11-15 DIAGNOSIS — I6521 Occlusion and stenosis of right carotid artery: Secondary | ICD-10-CM

## 2023-11-15 MED ORDER — LOSARTAN POTASSIUM 25 MG PO TABS: 25.0000 mg | ORAL_TABLET | Freq: Every day | ORAL | 0 refills | Status: DC

## 2023-11-15 NOTE — Progress Notes (Signed)
 Subjective: CC:HTN PCP: Jolinda Norene HERO, DO YEP:Eric Cook is a 78 y.o. male presenting to clinic today for:  BP noted to be 193/81 at vascular surgery visit yesterday. BP was controlled at our last OV last month.  He reports that he has been feeling well and he has not had any major changes in diet.  He is compliant with his blood pressure medication.  Anticipated right carotid surgery soon for critical stenosis.   ROS: Per HPI  No Known Allergies Past Medical History:  Diagnosis Date   Anxiety    Carotid artery occlusion    Chronic back pain    lumbar   COPD (chronic obstructive pulmonary disease) (HCC)    HLD (hyperlipidemia)    Hyperlipidemia    Hypertension    Peptic ulcer    Seizures (HCC)    medication related   Stroke [facility])     Current Outpatient Medications:    acetaminophen  (TYLENOL ) 500 MG tablet, Take 500 mg by mouth as needed for moderate pain or mild pain (As Needed)., Disp: , Rfl:    aspirin  EC 81 MG tablet, Take 1 tablet (81 mg total) by mouth daily. Swallow whole., Disp: 30 tablet, Rfl: 0   clopidogrel  (PLAVIX ) 75 MG tablet, Take 1 tablet (75 mg total) by mouth daily., Disp: 30 tablet, Rfl: 0   furosemide  (LASIX ) 20 MG tablet, TAKE 1 TABLET (20 MG TOTAL) BY MOUTH DAILY AS NEEDED FOR FLUID., Disp: 90 tablet, Rfl: 0   levETIRAcetam  (KEPPRA  XR) 500 MG 24 hr tablet, Take 1 tablet (500 mg total) by mouth at bedtime., Disp: 90 tablet, Rfl: 3   metoprolol  tartrate (LOPRESSOR ) 25 MG tablet, Take 1 tablet (25 mg total) by mouth 2 (two) times daily., Disp: 60 tablet, Rfl: 0   pantoprazole  (PROTONIX ) 40 MG tablet, Take 1 tablet (40 mg total) by mouth daily., Disp: 15 tablet, Rfl: 0   rosuvastatin  (CRESTOR ) 20 MG tablet, Take 1 tablet (20 mg total) by mouth daily., Disp: 30 tablet, Rfl: 0   sertraline  (ZOLOFT ) 50 MG tablet, TAKE 1 TABLET BY MOUTH EVERY DAY, Disp: 90 tablet, Rfl: 2   tamsulosin  (FLOMAX ) 0.4 MG CAPS capsule, Take 1 capsule (0.4 mg total) by mouth  daily after supper., Disp: 30 capsule, Rfl: 0   traZODone  (DESYREL ) 50 MG tablet, TAKE 0.5-1 TABLETS BY MOUTH AT BEDTIME AS NEEDED FOR SLEEP., Disp: 90 tablet, Rfl: 3 Social History   Socioeconomic History   Marital status: Single    Spouse name: Not on file   Number of children: 1   Years of education: Not on file   Highest education level: Some college, no degree  Occupational History   Occupation: Lobbyist    Comment: part time  Tobacco Use   Smoking status: Former    Current packs/day: 0.00    Average packs/day: 1 pack/day for 54.0 years (54.0 ttl pk-yrs)    Types: Cigarettes    Start date: 04/1965    Quit date: 04/2019    Years since quitting: 4.5   Smokeless tobacco: Never  Vaping Use   Vaping status: Never Used  Substance and Sexual Activity   Alcohol use: Never   Drug use: Never   Sexual activity: Never  Other Topics Concern   Not on file  Social History Narrative   Lives alone   Right Handed   Drinks 2-3 cups of caffeine daily   Son in [place]   Social Drivers of [employer]  Strain: Low Risk  (11/14/2023)   Overall Financial Resource Strain (CARDIA)    Difficulty of Paying Living Expenses: Not very hard  Food Insecurity: No Food Insecurity (11/14/2023)   Hunger Vital Sign    Worried About Running Out of Food in the Last Year: Never true    Ran Out of Food in the Last Year: Never true  Transportation Needs: No Transportation Needs (11/14/2023)   PRAPARE - Administrator, Civil Service (Medical): No    Lack of Transportation (Non-Medical): No  Physical Activity: Sufficiently Active (11/14/2023)   Exercise Vital Sign    Days of Exercise per Week: 6 days    Minutes of Exercise per Session: 60 min  Stress: Stress Concern Present (11/14/2023)   [employer] of Occupational Health - Occupational Stress Questionnaire    Feeling of Stress: To some extent  Social Connections: Unknown (11/14/2023)   Social  Connection and Isolation Panel    Frequency of Communication with Friends and Family: More than three times a week    Frequency of Social Gatherings with Friends and Family: Once a week    Attends Religious Services: Patient declined    Database administrator or Organizations: Patient declined    Attends Engineer, structural: Not on file    Marital Status: Divorced  Recent Concern: Social Connections - Socially Isolated (10/06/2023)   Social Connection and Isolation Panel    Frequency of Communication with Friends and Family: More than three times a week    Frequency of Social Gatherings with Friends and Family: Twice a week    Attends Religious Services: Patient declined    Database administrator or Organizations: No    Attends Engineer, structural: Not on file    Marital Status: Divorced  Intimate Partner Violence: Not At Risk (10/17/2023)   Humiliation, Afraid, Rape, and Kick questionnaire    Fear of Current or Ex-Partner: No    Emotionally Abused: No    Physically Abused: No    Sexually Abused: No   Family History  Problem Relation Age of Onset   Colon polyps Brother 64   Colon cancer Brother    Colon polyps Son 48   Other Son        vertigo   Kidney Stones Son    Esophageal cancer Neg Hx    Pancreatic cancer Neg Hx    Stomach cancer Neg Hx    Rectal cancer Neg Hx     Objective: Office vital signs reviewed. BP (!) 162/69   Pulse (!) 57   Temp 97.6 F (36.4 C)   Ht 5' 9 (1.753 m)   Wt 203 lb (92.1 kg)   SpO2 98%   BMI 29.98 kg/m   Physical Examination:  General: Awake, alert, nontoxic elderly male, No acute distress HEENT: sclera white, MMM Cardio: regular rate and rhythm, S1S2 heard, no murmurs appreciated Pulm: clear to auscultation bilaterally, no wheezes, rhonchi or rales; normal work of breathing on room air Extremities: warm, well perfused, No edema, cyanosis or clubbing; +2 pulses bilaterally    Assessment/ Plan: 78 y.o. male    Essential hypertension - Plan: losartan  (COZAAR ) 25 MG tablet, Basic Metabolic Panel  Stenosis of right carotid artery   Start losartan  25 mg daily.  Future order for BMP to be collected in 2 weeks.  2-week follow-up with nurse for blood pressure recheck.  Discussed goal of less than 140/90   Norene CHRISTELLA Fielding, DO [facility] [phone]

## 2023-11-28 ENCOUNTER — Other Ambulatory Visit: Payer: Self-pay | Admitting: *Deleted

## 2023-11-29 ENCOUNTER — Other Ambulatory Visit: Payer: Self-pay

## 2023-11-29 ENCOUNTER — Other Ambulatory Visit: Payer: Self-pay | Admitting: Family Medicine

## 2023-11-29 ENCOUNTER — Ambulatory Visit: Payer: Self-pay

## 2023-11-29 ENCOUNTER — Telehealth: Payer: Self-pay | Admitting: *Deleted

## 2023-11-29 DIAGNOSIS — I10 Essential (primary) hypertension: Secondary | ICD-10-CM

## 2023-11-29 MED ORDER — LOSARTAN POTASSIUM 50 MG PO TABS: 50.0000 mg | ORAL_TABLET | Freq: Every day | ORAL | 3 refills | Status: DC

## 2023-11-29 NOTE — Progress Notes (Signed)
 Please advise him to increase to 50mg  of Losartan  daily. New rx sent to mail order but can double up on the 25mg s for now.

## 2023-11-29 NOTE — Progress Notes (Signed)
 Patient is in office today for a nurse visit for Blood Pressure Check. 187/80 followed by 184/72, right arm, normal cuff, sitting

## 2023-11-29 NOTE — Telephone Encounter (Signed)
 Contacted patient. Notified patient. Patient verbalized understanding.

## 2023-11-30 LAB — BASIC METABOLIC PANEL WITH GFR
BUN/Creatinine Ratio: 14 (ref 10–24)
BUN: 14 mg/dL (ref 8–27)
CO2: 24 mmol/L (ref 20–29)
Calcium: 10 mg/dL (ref 8.6–10.2)
Chloride: 103 mmol/L (ref 96–106)
Creatinine, Ser: 1.02 mg/dL (ref 0.76–1.27)
Glucose: 95 mg/dL (ref 70–99)
Potassium: 3.6 mmol/L (ref 3.5–5.2)
Sodium: 143 mmol/L (ref 134–144)
eGFR: 76 mL/min/1.73 (ref >59)

## 2023-12-02 ENCOUNTER — Ambulatory Visit: Payer: Self-pay | Admitting: Family Medicine

## 2023-12-03 ENCOUNTER — Ambulatory Visit: Admitting: Pulmonary Disease

## 2023-12-03 ENCOUNTER — Encounter: Payer: Self-pay | Admitting: Pulmonary Disease

## 2023-12-03 VITALS — BP 136/72 | HR 83 | Ht 69.0 in | Wt 205.8 lb

## 2023-12-03 DIAGNOSIS — R0609 Other forms of dyspnea: Secondary | ICD-10-CM | POA: Diagnosis not present

## 2023-12-03 DIAGNOSIS — R053 Chronic cough: Secondary | ICD-10-CM | POA: Diagnosis not present

## 2023-12-03 DIAGNOSIS — J432 Centrilobular emphysema: Secondary | ICD-10-CM

## 2023-12-03 NOTE — Patient Instructions (Signed)
 It is nice to see you again  Lets revisit inhaler couple months once you get your all your procedures  For the nasal congestion I recommend an antihistamine, common brand names are Xyzal, Allegra, Zyrtec , and Claritin.  The brand or the generic would be fine.  Take 1 tablet at night.  Use nasal sprays you have at home, 1 spray each nostril twice a day.  If it makes the bleeding worse then just stop it for a few days.  You have quite a bit of congestion your sinuses I can hear it in your throat.  Fortunately your lungs are very clear.  This is very reassuring.  Return to clinic in 2 months or sooner as needed with Dr. Annella

## 2023-12-03 NOTE — Progress Notes (Signed)
 @Patient  ID: Eric Cook, male    DOB: 05-Mar-1945, 78 y.o.   MRN: [id number]  Chief Complaint  Patient presents with   Medical Management of Chronic Issues    Pt states  3/4 week ago stunt was placed     Referring provider: Jolinda Norene HERO, DO  HPI:   78 y.o. man whom are seen in follow up for evaluation of chronic cough.  Most recent note from PCP reviewed.  Multiple hospital notes, vascular surgery notes reviewed.  Cough is ok.  Dyspnea seems stable.  Minimal.  Cough a bit worse over the last couple of weeks.  He reports quite a bit of nasal congestion, postnasal drip symptoms.  Sometimes feels like it settles in his chest.  He is been without his inhaler now for many months.  Trelegy.  It helped quite a bit.  Unfortunately too expensive.  Despite being without it for some time, symptoms really are not any worse.  This is reassuring.  He has had multiple hospital visits recently and undergoing vascularization of his carotid arteries.  After TIA.  Second procedure planned in the near future.  He had a CT scan chest abdomen pelvis with contrast not for pulmonary reason in the interim since last visit, 06-2023, on my review and interpretation reveals clear lungs, emphysema, no other concerning findings.  HPI at initial visit: Patient has daily cough.  At least present for 2 years.  On further questioning likely present longer.  Intermittent cough for 20+ years.  Worse in the mornings.  And in the evenings.  Productive of sputum about 40% of the time.  Sometimes has cough and cannot bring up sputum.  He is able to mobilize sputum cough seems to improve.  Recently put on PPI about 2 weeks ago.  This is helped cough about 30%.  Has decreased in frequency and severity of cough a bit.  He continues to have ongoing sputum production.  He does endorse nasal congestion, postnasal drip symptoms.  Uses Flonase  daily.  He is a former smoker, 40-pack-year, updated today as was underrepresented in the  social history tab.  No seasonal environmental factors he can identify that makes the cough better or worse.  No position makes things better or worse.  No other alleviating or exacerbating factors.  Has not used inhalers.  Reviewed most recent chest x-ray 08/2019 that on my review and interpretation reveals mild hyperinflation, bronchitic changes in the bases.  PMH: Stomach pain, hypertension, GERD, history of tobacco abuse in remission Surgical history: Reviewed, denies any Family history: No significant rest or illness in first relatives Social history: Former smoker, at least 40-pack-year, quit 2020  Questionaires / Pulmonary Flowsheets:   ACT:      No data to display          MMRC:     No data to display          Epworth:      No data to display          Tests:   FENO:  No results found for: NITRICOXIDE  PFT:    Latest Ref Rng & Units 12/19/2021    9:44 AM  PFT Results  FVC-Pre L 3.14   FVC-Predicted Pre % 78   FVC-Post L 3.25   FVC-Predicted Post % 81   Pre FEV1/FVC % % 72   Post FEV1/FCV % % 72   FEV1-Pre L 2.26   FEV1-Predicted Pre % 78   FEV1-Post L 2.33  DLCO uncorrected ml/min/mmHg 16.73   DLCO UNC% % 69   DLCO corrected ml/min/mmHg 16.73   DLCO COR %Predicted % 69   DLVA Predicted % 74   Personally reviewed and interpreted as normal spirometry, no bronchodilator response.  Lung volumes are normal limits DLCO moderately reduced.  WALK:      No data to display          Imaging: Personally reviewed and as per EMR discussion this note VAS US  CAROTID Result Date: 11/14/2023 Carotid Arterial Duplex Study Patient Name:  Eric Cook  Date of Exam:   11/14/2023 Medical Rec #: [id number]        Accession #:    [id number] Date of Birth: May 16, 1945        Patient Gender: M Patient Age:   78 years Exam Location:  [address] Procedure:      VAS US  CAROTID Referring Phys: Eric Cook  --------------------------------------------------------------------------------  Indications:       Stenosis of the left carotid artery. Risk Factors:      Hypertension, hyperlipidemia, past history of smoking. Comparison Study:  N/A Performing Technologist: Eric Cook  Examination Guidelines: A complete evaluation includes B-mode imaging, spectral Doppler, color Doppler, and power Doppler as needed of all accessible portions of each vessel. Bilateral testing is considered an integral part of a complete examination. Limited examinations for reoccurring indications may be performed as noted.  Right Carotid Findings: +----------+-------+--------+--------+-----------------------+-----------------+           PSV    EDV cm/sStenosisPlaque Description     Comments                    cm/s                                                            +----------+-------+--------+--------+-----------------------+-----------------+ CCA Prox  49     9                                      intimal                                                                   thickening        +----------+-------+--------+--------+-----------------------+-----------------+ CCA Distal28     5                                      intimal                                                                   thickening        +----------+-------+--------+--------+-----------------------+-----------------+ ICA Prox  565    137  80-99%  calcific and                                                              heterogenous                             +----------+-------+--------+--------+-----------------------+-----------------+ ICA Mid   122    0       40-59%                                           +----------+-------+--------+--------+-----------------------+-----------------+ ICA Distal58     11                                                        +----------+-------+--------+--------+-----------------------+-----------------+ ECA       65     8               heterogenous                             +----------+-------+--------+--------+-----------------------+-----------------+ +----------+--------+-------+----------------+-------------------+           PSV cm/sEDV cmsDescribe        Arm Pressure (mmHG) +----------+--------+-------+----------------+-------------------+ [id number]      0      Multiphasic, [id number]                 +----------+--------+-------+----------------+-------------------+ +---------+--------+--+--------+-+---------+ VertebralPSV cm/s33EDV cm/s7Antegrade +---------+--------+--+--------+-+---------+  Left Carotid Findings: +----------+--------+--------+--------+------------------+------------------+           PSV cm/sEDV cm/sStenosisPlaque DescriptionComments           +----------+--------+--------+--------+------------------+------------------+ CCA Prox  86      7                                 intimal thickening +----------+--------+--------+--------+------------------+------------------+ CCA Distal54      6                                 intimal thickening +----------+--------+--------+--------+------------------+------------------+ ICA Prox  41      9       1-39%                                        +----------+--------+--------+--------+------------------+------------------+ ICA Mid   69      14                                                   +----------+--------+--------+--------+------------------+------------------+ ICA Distal53      9                                                    +----------+--------+--------+--------+------------------+------------------+  ECA       77      0               heterogenous                         +----------+--------+--------+--------+------------------+------------------+  +----------+--------+--------+----------------+-------------------+           PSV cm/sEDV cm/sDescribe        Arm Pressure (mmHG) +----------+--------+--------+----------------+-------------------+ Subclavian80      0       Multiphasic, [id number]                 +----------+--------+--------+----------------+-------------------+ +---------+--------+--+--------+--+---------+ VertebralPSV cm/s43EDV cm/s11Antegrade +---------+--------+--+--------+--+---------+  Left Stent(s): +----------------------------+--------+--------+------------+--------+---------+ CCA distal to ICA distal    PSV cm/sEDV cm/sStenosis    WaveformComments  stent                                                                     +----------------------------+--------+--------+------------+--------+---------+ Prox to Stent               48      8       <50%                                                                      stenosis                      +----------------------------+--------+--------+------------+--------+---------+ Proximal Stent              54      6                                     +----------------------------+--------+--------+------------+--------+---------+ Mid Stent                   41      9                                     +----------------------------+--------+--------+------------+--------+---------+ Distal Stent                54      9                           ICA                                                                       distal    +----------------------------+--------+--------+------------+--------+---------+ Unable to visualize the distal stent and distal to stent.  Summary: Right Carotid: Velocities in the right ICA are consistent  with a 80-99%                stenosis. The ECA appears <50% stenosed. Left Carotid: The ECA appears <50% stenosed. Patent left CCA distal to ICA               distal stent. <50% stenosis is seen  within the stent. Unable to               visualize distal stent and distal to stent. Vertebrals:  Bilateral vertebral arteries demonstrate antegrade flow. Subclavians: Normal flow hemodynamics were seen in bilateral subclavian              arteries. *See table(s) above for measurements and observations.  Electronically signed by Eric Rim on 11/14/2023 at 4:13:35 PM.    Final     Lab Results: Personally reviewed CBC    Component Value Date/Time   WBC 10.4 10/15/2023 0409   RBC 3.84 (L) 10/15/2023 0409   HGB 12.5 (L) 10/15/2023 0409   HGB 13.3 07/01/2023 1107   HCT 35.8 (L) 10/15/2023 0409   HCT 41.4 07/01/2023 1107   PLT 227 10/15/2023 0409   PLT 229 07/01/2023 1107   MCV 93.2 10/15/2023 0409   MCV 97 07/01/2023 1107   MCH 32.6 10/15/2023 0409   MCHC 34.9 10/15/2023 0409   RDW 12.5 10/15/2023 0409   RDW 13.7 07/01/2023 1107   LYMPHSABS 1.2 10/05/2023 2008   LYMPHSABS 1.3 07/01/2023 1107   MONOABS 0.9 10/05/2023 2008   EOSABS 0.1 10/05/2023 2008   EOSABS 0.2 07/01/2023 1107   BASOSABS 0.1 10/05/2023 2008   BASOSABS 0.0 07/01/2023 1107    BMET    Component Value Date/Time   NA 143 11/29/2023 0933   K 3.6 11/29/2023 0933   CL 103 11/29/2023 0933   CO2 24 11/29/2023 0933   GLUCOSE 95 11/29/2023 0933   GLUCOSE 119 (H) 10/15/2023 0411   BUN 14 11/29/2023 0933   CREATININE 1.02 11/29/2023 0933   CALCIUM  10.0 11/29/2023 0933   GFRNONAA >60 10/15/2023 0411   GFRAA 77 09/08/2019 1620    BNP    Component Value Date/Time   BNP 6.7 08/07/2022 1442   BNP 98.5 08/03/2022 0858    ProBNP    Component Value Date/Time   PROBNP 746.0 (H) 07/18/2023 1010    Specialty Problems       Pulmonary Problems   COPD (chronic obstructive pulmonary disease) (HCC)    No Known Allergies  Immunization History  Administered Date(s) Administered   Fluad Quad(high Dose 65+) 10/14/2019, 11/03/2020, 10/18/2021   Moderna Sars-Covid-2 Vaccination 03/18/2019, 04/15/2019, 12/01/2019    PNEUMOCOCCAL CONJUGATE-20 11/03/2020   Pfizer(Comirnaty)Fall Seasonal Vaccine 12 years and older 01/17/2022   Pneumococcal Conjugate-13 10/14/2019    Past Medical History:  Diagnosis Date   Anxiety    Carotid artery occlusion    Chronic back pain    lumbar   COPD (chronic obstructive pulmonary disease) (HCC)    HLD (hyperlipidemia)    Hyperlipidemia    Hypertension    Peptic ulcer    Seizures (HCC)    medication related   Stroke (HCC)     Tobacco History: Social History   Tobacco Use  Smoking Status Former   Current packs/day: 0.00   Average packs/day: 1 pack/day for 54.0 years (54.0 ttl pk-yrs)   Types: Cigarettes   Start date: 04/1965   Quit date: 04/2019   Years since quitting: 4.6  Smokeless Tobacco Never   Counseling given: Not Answered  Continue to not smoke  Outpatient Encounter Medications as of 12/03/2023  Medication Sig   acetaminophen  (TYLENOL ) 500 MG tablet Take 500 mg by mouth as needed for moderate pain or mild pain (As Needed).   aspirin  EC 81 MG tablet Take 1 tablet (81 mg total) by mouth daily. Swallow whole.   clopidogrel  (PLAVIX ) 75 MG tablet Take 1 tablet (75 mg total) by mouth daily.   furosemide  (LASIX ) 20 MG tablet TAKE 1 TABLET (20 MG TOTAL) BY MOUTH DAILY AS NEEDED FOR FLUID.   levETIRAcetam  (KEPPRA  XR) 500 MG 24 hr tablet Take 1 tablet (500 mg total) by mouth at bedtime.   losartan  (COZAAR ) 50 MG tablet Take 1 tablet (50 mg total) by mouth daily. For blood pressure **dose change   metoprolol  tartrate (LOPRESSOR ) 25 MG tablet TAKE 1 TABLET BY MOUTH TWICE A DAY   pantoprazole  (PROTONIX ) 40 MG tablet Take 1 tablet (40 mg total) by mouth daily.   rosuvastatin  (CRESTOR ) 20 MG tablet TAKE 1 TABLET BY MOUTH EVERY DAY   sertraline  (ZOLOFT ) 50 MG tablet TAKE 1 TABLET BY MOUTH EVERY DAY   tamsulosin  (FLOMAX ) 0.4 MG CAPS capsule Take 1 capsule (0.4 mg total) by mouth daily after supper.   traZODone  (DESYREL ) 50 MG tablet TAKE 0.5-1 TABLETS BY  MOUTH AT BEDTIME AS NEEDED FOR SLEEP.   No facility-administered encounter medications on file as of 12/03/2023.     Review of Systems  Review of Systems  N/a Physical Exam  BP 136/72   Pulse 83   Ht 5' 9 (1.753 m) Comment: per pt  Wt 205 lb 12.8 oz (93.4 kg)   SpO2 97%   BMI 30.39 kg/m   Wt Readings from Last 5 Encounters:  12/03/23 205 lb 12.8 oz (93.4 kg)  11/15/23 203 lb (92.1 kg)  11/14/23 203 lb (92.1 kg)  11/06/23 203 lb (92.1 kg)  10/16/23 203 lb 2 oz (92.1 kg)    BMI Readings from Last 5 Encounters:  12/03/23 30.39 kg/m  11/15/23 29.98 kg/m  11/14/23 29.98 kg/m  11/06/23 29.98 kg/m  10/16/23 30.00 kg/m     Physical Exam General: Sitting in chair, no distress Eyes: No icterus Neck: No JVP, small surgical scar above the left clavicle noted well healing Pulmonary: Distant, clear, normal work of breathing on room air Abdomen: Nondistended Cardiovascular: Regular rate and rhythm   Assessment & Plan:   Chronic cough: Multifactorial.  Has improved in the past albeit mildly with PPI therapy.  Raises suspicion of GERD.  Sputum production, chronic, underlying chronic bronchitis as well.  Historically improved with escalation to triple inhaled therapy via Trelegy.  Better controlled on ICS/LABA.  Unfortunately Trelegy too expensive.  A bit worse cough recently with sound like postnasal drip.  Encouraged to use antihistamine at night and nasal sprays twice a day.  See if this helps with postnasal drip and cough.  A lot of hospital bills with recent surgeries procedures and another 1 upcoming.  Discussed reconvening in the coming weeks to see what options we could use for inhalers, Trelegy specifically given improvement in the past but fortunately symptoms are fairly mild despite being off this medication.  Dyspnea exertion: Mild, PFTs in the past largely normal.  No worsening off inhalers.  Hope to resume inhalers in the future once sorting out cost issues and  hospital bills.  Return in about 2 months (around 02/02/2024) for f/u Dr. Annella.   Donnice JONELLE Annella, MD 12/03/2023

## 2023-12-04 ENCOUNTER — Other Ambulatory Visit: Payer: Self-pay | Admitting: Family Medicine

## 2023-12-04 DIAGNOSIS — F5104 Psychophysiologic insomnia: Secondary | ICD-10-CM

## 2023-12-04 NOTE — Telephone Encounter (Unsigned)
 Copied from CRM [id number]. Topic: Clinical - Medication Refill >> Dec 04, 2023 12:48 PM Emylou G wrote: Medication: traZODone  (DESYREL ) 50 MG tablet  Has the patient contacted their pharmacy? No (Agent: If no, request that the patient contact the pharmacy for the refill. If patient does not wish to contact the pharmacy document the reason why and proceed with request.) (Agent: If yes, when and what did the pharmacy advise?)  This is the patient's preferred pharmacy:    [id number] - [place], [place] - [address] [place] [address] Phone: [phone] Fax: [phone]    Is this the correct pharmacy for this prescription? Yes If no, delete pharmacy and type the correct one.   Has the prescription been filled recently? No  Is the patient out of the medication? Yes  Has the patient been seen for an appointment in the last year OR does the patient have an upcoming appointment? Yes  Can we respond through MyChart? Yes  Agent: Please be advised that Rx refills may take up to 3 business days. We ask that you follow-up with your pharmacy.

## 2023-12-05 ENCOUNTER — Other Ambulatory Visit: Payer: Self-pay | Admitting: Family Medicine

## 2023-12-05 DIAGNOSIS — F5104 Psychophysiologic insomnia: Secondary | ICD-10-CM

## 2023-12-05 MED ORDER — ASPIRIN 81 MG PO TBEC: 81.0000 mg | DELAYED_RELEASE_TABLET | Freq: Every day | ORAL | 2 refills | Status: AC

## 2023-12-05 MED ORDER — CLOPIDOGREL BISULFATE 75 MG PO TABS: 75.0000 mg | ORAL_TABLET | Freq: Every day | ORAL | 2 refills | Status: DC

## 2023-12-05 MED ORDER — TRAZODONE HCL 50 MG PO TABS: 50.0000 mg | ORAL_TABLET | Freq: Every evening | ORAL | 0 refills | Status: DC | PRN

## 2023-12-05 NOTE — Telephone Encounter (Signed)
  Prescription Request  12/05/2023  Is this a Controlled Substance medicine? yes  Have you seen your PCP in the last 2 weeks? NO  If YES, route message to pool  -  If NO, patient needs to be scheduled for appointment.  What is the name of the medication or equipment? Aspirin  81 mg, clopidogrel  75 mg,Trazodone  50 mg - Patient is out.  Have you contacted your pharmacy to request a refill? yes   Which pharmacy would you like this sent to? [facility] in [place]   Patient notified that their request is being sent to the clinical staff for review and that they should receive a response within 2 business days.

## 2023-12-09 NOTE — Progress Notes (Signed)
 Surgical Instructions   Your procedure is scheduled on Friday, November 21st, 2025. Report to [facility] Main Entrance A at 5:30 A.M., then check in with the Admitting office. Any questions or running late day of surgery: call [phone]  Questions prior to your surgery date: call [phone], Monday-Friday, 8am-4pm. If you experience any cold or flu symptoms such as cough, fever, chills, shortness of breath, etc. between now and your scheduled surgery, please notify us  at the above number.     Remember:  Do not eat or drink after midnight the night before your surgery    Take these medicines the morning of surgery with A SIP OF WATER : Aspirin  Clopidogrel  (Plavix ) Metoprolol  Tartrate (Lopressor ) Pantoprazole  (Protonix ) Rosuvastatin  (Crestor ) Sertraline  (Zoloft )   May take these medicines IF NEEDED: Acetaminophen  (Tylenol )    One week prior to surgery, STOP taking any Aleve, Naproxen, Ibuprofen , Motrin , Advil , Goody's, BC's, all herbal medications, fish oil, and non-prescription vitamins.                     Do NOT Smoke (Tobacco/Vaping) for 24 hours prior to your procedure.  If you use a CPAP at night, you may bring your mask/headgear for your overnight stay.   You will be asked to remove any contacts, glasses, piercing's, hearing aid's, dentures/partials prior to surgery. Please bring cases for these items if needed.    Patients discharged the day of surgery will not be allowed to drive home, and someone needs to stay with them for 24 hours.  SURGICAL WAITING ROOM VISITATION Patients may have no more than 2 support people in the waiting area - these visitors may rotate.   Pre-op nurse will coordinate an appropriate time for 1 ADULT support person, who may not rotate, to accompany patient in pre-op.  Children under the age of 5 must have an adult with them who is not the patient and must remain in the main waiting area with an adult.  If the patient needs to stay at  the hospital during part of their recovery, the visitor guidelines for inpatient rooms apply.  Please refer to the [facility] website for the visitor guidelines for any additional information.   If you received a COVID test during your pre-op visit  it is requested that you wear a mask when out in public, stay away from anyone that may not be feeling well and notify your surgeon if you develop symptoms. If you have been in contact with anyone that has tested positive in the last 10 days please notify you surgeon.      Pre-operative CHG Bathing Instructions   You can play a key role in reducing the risk of infection after surgery. Your skin needs to be as free of germs as possible. You can reduce the number of germs on your skin by washing with CHG (chlorhexidine  gluconate) soap before surgery. CHG is an antiseptic soap that kills germs and continues to kill germs even after washing.   DO NOT use if you have an allergy to chlorhexidine /CHG or antibacterial soaps. If your skin becomes reddened or irritated, stop using the CHG and notify one of our RNs at [phone].              TAKE A SHOWER THE NIGHT BEFORE SURGERY   Please keep in mind the following:  DO NOT shave, including legs and underarms, 48 hours prior to surgery.   You may shave your face before/day of surgery.  Place clean sheets  on your bed the night before surgery Use a clean washcloth (not used since being washed) for shower. DO NOT sleep with pet's night before surgery.  CHG Shower Instructions:  Wash your face and private area with normal soap. If you choose to wash your hair, wash first with your normal shampoo.  After you use shampoo/soap, rinse your hair and body thoroughly to remove shampoo/soap residue.  Turn the water  OFF and apply half the bottle of CHG soap to a CLEAN washcloth.  Apply CHG soap ONLY FROM YOUR NECK DOWN TO YOUR TOES (washing for 3-5 minutes)  DO NOT use CHG soap on face, private areas, open  wounds, or sores.  Pay special attention to the area where your surgery is being performed.  If you are having back surgery, having someone wash your back for you may be helpful. Wait 2 minutes after CHG soap is applied, then you may rinse off the CHG soap.  Pat dry with a clean towel  Put on clean pajamas    Additional instructions for the day of surgery: If you choose, you may shower the morning of surgery with an antibacterial soap.  DO NOT APPLY any lotions, deodorants, cologne, or perfumes.   Do not wear jewelry or makeup Do not wear nail polish, gel polish, artificial nails, or any other type of covering on natural nails (fingers and toes) Do not bring valuables to the hospital. [facility] is not responsible for valuables/personal belongings. Put on clean/comfortable clothes.  Please brush your teeth.  Ask your nurse before applying any prescription medications to the skin.

## 2023-12-10 ENCOUNTER — Other Ambulatory Visit: Payer: Self-pay

## 2023-12-10 ENCOUNTER — Encounter (HOSPITAL_COMMUNITY): Payer: Self-pay

## 2023-12-10 ENCOUNTER — Encounter (HOSPITAL_COMMUNITY)
Admission: RE | Admit: 2023-12-10 | Discharge: 2023-12-10 | Disposition: A | Discharge status: Home / Self Care | Source: Ambulatory Visit | Attending: Vascular Surgery | Admitting: Vascular Surgery

## 2023-12-10 VITALS — BP 180/86 | HR 71 | Temp 97.7°F | Resp 18 | Ht 69.0 in | Wt 203.4 lb

## 2023-12-10 DIAGNOSIS — E785 Hyperlipidemia, unspecified: Secondary | ICD-10-CM | POA: Diagnosis present

## 2023-12-10 DIAGNOSIS — Z01812 Encounter for preprocedural laboratory examination: Secondary | ICD-10-CM | POA: Insufficient documentation

## 2023-12-10 DIAGNOSIS — I110 Hypertensive heart disease with heart failure: Secondary | ICD-10-CM | POA: Insufficient documentation

## 2023-12-10 DIAGNOSIS — G40209 Localization-related (focal) (partial) symptomatic epilepsy and epileptic syndromes with complex partial seizures, not intractable, without status epilepticus: Secondary | ICD-10-CM | POA: Insufficient documentation

## 2023-12-10 DIAGNOSIS — I890 Lymphedema, not elsewhere classified: Secondary | ICD-10-CM | POA: Insufficient documentation

## 2023-12-10 DIAGNOSIS — I6523 Occlusion and stenosis of bilateral carotid arteries: Secondary | ICD-10-CM | POA: Diagnosis present

## 2023-12-10 DIAGNOSIS — E876 Hypokalemia: Secondary | ICD-10-CM | POA: Insufficient documentation

## 2023-12-10 DIAGNOSIS — N400 Enlarged prostate without lower urinary tract symptoms: Secondary | ICD-10-CM | POA: Insufficient documentation

## 2023-12-10 DIAGNOSIS — I5032 Chronic diastolic (congestive) heart failure: Secondary | ICD-10-CM | POA: Insufficient documentation

## 2023-12-10 DIAGNOSIS — Z8711 Personal history of peptic ulcer disease: Secondary | ICD-10-CM | POA: Diagnosis not present

## 2023-12-10 DIAGNOSIS — Z792 Long term (current) use of antibiotics: Secondary | ICD-10-CM | POA: Insufficient documentation

## 2023-12-10 DIAGNOSIS — I6521 Occlusion and stenosis of right carotid artery: Secondary | ICD-10-CM | POA: Diagnosis not present

## 2023-12-10 DIAGNOSIS — I6522 Occlusion and stenosis of left carotid artery: Secondary | ICD-10-CM | POA: Diagnosis not present

## 2023-12-10 DIAGNOSIS — Z87891 Personal history of nicotine dependence: Secondary | ICD-10-CM | POA: Diagnosis not present

## 2023-12-10 DIAGNOSIS — I5022 Chronic systolic (congestive) heart failure: Secondary | ICD-10-CM | POA: Diagnosis not present

## 2023-12-10 DIAGNOSIS — Z79899 Other long term (current) drug therapy: Secondary | ICD-10-CM | POA: Insufficient documentation

## 2023-12-10 DIAGNOSIS — K219 Gastro-esophageal reflux disease without esophagitis: Secondary | ICD-10-CM | POA: Diagnosis present

## 2023-12-10 DIAGNOSIS — G8929 Other chronic pain: Secondary | ICD-10-CM | POA: Diagnosis present

## 2023-12-10 DIAGNOSIS — I10 Essential (primary) hypertension: Secondary | ICD-10-CM | POA: Diagnosis present

## 2023-12-10 DIAGNOSIS — F419 Anxiety disorder, unspecified: Secondary | ICD-10-CM | POA: Diagnosis present

## 2023-12-10 DIAGNOSIS — J449 Chronic obstructive pulmonary disease, unspecified: Secondary | ICD-10-CM | POA: Diagnosis present

## 2023-12-10 DIAGNOSIS — I5033 Acute on chronic diastolic (congestive) heart failure: Secondary | ICD-10-CM | POA: Diagnosis not present

## 2023-12-10 DIAGNOSIS — Z01818 Encounter for other preprocedural examination: Secondary | ICD-10-CM

## 2023-12-10 DIAGNOSIS — Z8673 Personal history of transient ischemic attack (TIA), and cerebral infarction without residual deficits: Secondary | ICD-10-CM | POA: Diagnosis not present

## 2023-12-10 DIAGNOSIS — M549 Dorsalgia, unspecified: Secondary | ICD-10-CM | POA: Diagnosis present

## 2023-12-10 HISTORY — DX: Gastro-esophageal reflux disease without esophagitis: K21.9

## 2023-12-10 LAB — CBC
HCT: 40.9 % (ref 39.0–52.0)
Hemoglobin: 14.3 g/dL (ref 13.0–17.0)
MCH: 32.6 pg (ref 26.0–34.0)
MCHC: 35 g/dL (ref 30.0–36.0)
MCV: 93.2 fL (ref 80.0–100.0)
Platelets: 195 K/uL (ref 150–400)
RBC: 4.39 MIL/uL (ref 4.22–5.81)
RDW: 11.9 % (ref 11.5–15.5)
WBC: 5.7 K/uL (ref 4.0–10.5)
nRBC: 0 % (ref 0.0–0.2)

## 2023-12-10 LAB — URINALYSIS, ROUTINE W REFLEX MICROSCOPIC
Bacteria, UA: NONE SEEN
Bilirubin Urine: NEGATIVE
Glucose, UA: NEGATIVE mg/dL
Ketones, ur: NEGATIVE mg/dL
Leukocytes,Ua: NEGATIVE
Nitrite: NEGATIVE
Protein, ur: NEGATIVE mg/dL
Specific Gravity, Urine: 1.017 (ref 1.005–1.030)
pH: 5 (ref 5.0–8.0)

## 2023-12-10 LAB — COMPREHENSIVE METABOLIC PANEL WITH GFR
ALT: 27 U/L (ref 0–44)
AST: 35 U/L (ref 15–41)
Albumin: 4.3 g/dL (ref 3.5–5.0)
Alkaline Phosphatase: 82 U/L (ref 38–126)
Anion gap: 14 (ref 5–15)
BUN: 17 mg/dL (ref 8–23)
CO2: 23 mmol/L (ref 22–32)
Calcium: 9.6 mg/dL (ref 8.9–10.3)
Chloride: 103 mmol/L (ref 98–111)
Creatinine, Ser: 0.97 mg/dL (ref 0.61–1.24)
GFR, Estimated: >60 mL/min (ref >60)
Glucose, Bld: 104 mg/dL — ABNORMAL HIGH (ref 70–99)
Potassium: 3.3 mmol/L — ABNORMAL LOW (ref 3.5–5.1)
Sodium: 140 mmol/L (ref 135–145)
Total Bilirubin: 1.4 mg/dL — ABNORMAL HIGH (ref 0.0–1.2)
Total Protein: 7.1 g/dL (ref 6.5–8.1)

## 2023-12-10 LAB — TYPE AND SCREEN
ABO/RH(D): O POS
Antibody Screen: NEGATIVE

## 2023-12-10 LAB — SURGICAL PCR SCREEN
MRSA, PCR: NEGATIVE
Staphylococcus aureus: NEGATIVE

## 2023-12-10 LAB — APTT: aPTT: 34 s (ref 24–36)

## 2023-12-10 NOTE — Progress Notes (Signed)
 PCP - Norene Fielding Cardiologist - Darryle O'Neal - LOV - 09/11/22  PPM/ICD - denies Device Orders -  n/a Rep Notified - n/a  Chest x-ray - 10/06/23 EKG - 10/04/23 Stress Test - 09/26/22 ECHO - 10/07/23 Cardiac Cath - denies  Sleep Study - denies  No DM  Last dose of GLP1 agonist-  n/a GLP1 instructions:  n/a  Blood Thinner Instructions: patient instructed to continue Aspirin  and Plavix    ERAS Protcol - NPO PRE-SURGERY Ensure or G2-  n/a  COVID TEST-  n/a   Anesthesia review: yes - previously reviewed by Lynwood on 10/11/23  Patient denies shortness of breath, fever, cough and chest pain at PAT appointment   All instructions explained to the patient, with a verbal understanding of the material. Patient agrees to go over the instructions while at home for a better understanding. Patient also instructed to self quarantine after being tested for COVID-19. The opportunity to ask questions was provided.

## 2023-12-11 NOTE — Progress Notes (Signed)
 Anesthesia Chart Review:  78 yo male with pertinent hx including former smoker (54 pack/yr quit 2021), complex partial seizures (stable on Keppra  XR 500 mg daily), HFpEF, essential hypertension, GERD on PPI, BPH, chronic lymphedema.    Recent admission 9/13-9/17/25 for TIA with severe left ICA stenosis (string sign on CTA; LICA with high grade stenosis esimated at 74%). Echo with EF 55-60%, grade 1 dd, normal RV, mild MR. EEG with no evidence of seizures. Vascular surgery recommended outpatient TCAR. He was discharged on DAPT with ASA and Plavix .  Patient did subsequently undergo left TCAR on 10/14/2023 without complication.  Seen in follow-up by neurology on 11/06/2023 and noted to be doing well, no recurrent stroke/TIA symptoms.  Continued on Keppra  XR 500 mg nightly for seizure prophylaxis.  1 year follow-up recommended.  Seen in follow-up by vascular surgeon Dr. Lanis on 11/14/2023.  Duplex at that time showed left ICA stent widely patent.  Right side ICA with critical stenosis, TCAR recommended.  Blood pressure was also noted to be elevated, 193/81.  He subsequently followed up with PCP Dr. Jolinda on 11/15/2023 and was started on losartan  25 mg daily.  He is continued on metoprolol  tartrate 25 mg twice daily.  Patient was seen for recheck on 11/29/2023, blood pressure 187/80 at that time.  Losartan  was increased to 50 mg twice daily.   Blood pressure was significantly better, 136/72, when seen by pulmonologist Dr. Annella on 12/03/2023 for follow-up of chronic cough (felt to be multifactorial, prior PFTs largely normal, recommended continue PPI and antihistamine).  Blood pressure elevated preadmission testing visit, 180/86.  Patient advised to continue medications as prescribed and follow-up with PCP for management.  Preop labs reviewed, mild hypokalemia potassium 3.3, otherwise unremarkable.   EKG 10/08/23: Sinus rhythm with Premature supraventricular complexes. Rate 82. Left anterior  fascicular block   TTE 10/07/23: 1. Left ventricular ejection fraction, by estimation, is 55 to 60%. The  left ventricle has normal function. The left ventricle has no regional  wall motion abnormalities. Left ventricular diastolic parameters are  consistent with Grade I diastolic  dysfunction (impaired relaxation).   2. Right ventricular systolic function is normal. The right ventricular  size is normal. Tricuspid regurgitation signal is inadequate for assessing  PA pressure.   3. The mitral valve is normal in structure. Mild mitral valve  regurgitation. No evidence of mitral stenosis.   4. The aortic valve is tricuspid. Aortic valve regurgitation is not  visualized. No aortic stenosis is present.    Nuclear stress 09/26/22: Normal perfusion images. The study is overall low risk. Note PVCs in bigeminy at peak stress.   No ST deviation was noted. Arrhythmias during stress: frequent PVCs in bigeminy at peak stress. Arrhythmias during recovery: occasional PACs, occasional PVCs.   LV perfusion is normal. There is no evidence of ischemia. There is no evidence of infarction.   Left ventricular function is normal. Nuclear stress EF: 66%. The left ventricular ejection fraction is hyperdynamic (>65%). End diastolic cavity size is normal. End systolic cavity size is normal. No evidence of transient ischemic dilation (TID) noted.   Prior study not available for comparison.     Eric Cook [facility] Short Stay Center/Anesthesiology Phone [phone] 12/11/2023 1:12 PM

## 2023-12-11 NOTE — Anesthesia Preprocedure Evaluation (Addendum)
 Anesthesia Evaluation  Patient identified by MRN, date of birth, ID band Patient awake    Reviewed: Allergy & Precautions, NPO status , Patient's Chart, lab work & pertinent test results, reviewed documented beta blocker date and time   History of Anesthesia Complications Negative for: history of anesthetic complications  Airway Mallampati: III  TM Distance: >3 FB Neck ROM: Full    Dental  (+) Dental Advisory Given, Edentulous Upper, Poor Dentition, Missing,    Pulmonary COPD, neg recent URI, former smoker   breath sounds clear to auscultation       Cardiovascular hypertension, +CHF  (-) Past MI and (-) Cardiac Stents  Rhythm:Regular Rate:Normal     Neuro/Psych Seizures -,  PSYCHIATRIC DISORDERS Anxiety     TIA   GI/Hepatic PUD,GERD  Poorly Controlled,,  Endo/Other  neg diabetes    Renal/GU Renal disease     Musculoskeletal   Abdominal   Peds  Hematology   Anesthesia Other Findings Alert and oriented to person, place and time  Reproductive/Obstetrics                              Anesthesia Physical Anesthesia Plan  ASA: 3  Anesthesia Plan: General   Post-op Pain Management:    Induction: Intravenous and Rapid sequence  PONV Risk Score and Plan: 2 and Ondansetron  and Dexamethasone   Airway Management Planned: Oral ETT  Additional Equipment: Arterial line  Intra-op Plan:   Post-operative Plan: Extubation in OR  Informed Consent: I have reviewed the patients History and Physical, chart, labs and discussed the procedure including the risks, benefits and alternatives for the proposed anesthesia with the patient or authorized representative who has indicated his/her understanding and acceptance.     Dental advisory given  Plan Discussed with:   Anesthesia Plan Comments: ([name] note by Cook Hope, PA-C: 78 yo male with pertinent hx including former smoker (54 pack/yr quit 2021),  complex partial seizures (stable on Keppra  XR 500 mg daily), HFpEF, essential hypertension, GERD on PPI, BPH, chronic lymphedema.   Recent admission 9/13-9/17/25 for TIA with severe left ICA stenosis (string sign on CTA; LICA with high grade stenosis esimated at 74%). Echo with EF 55-60%, grade 1 dd, normal RV, mild MR. EEG with no evidence of seizures. Vascular surgery recommended outpatient TCAR. He was discharged on DAPT with ASA and Plavix .  Patient did subsequently undergo left TCAR on 10/14/2023 without complication.  Seen in follow-up by neurology on 11/06/2023 and noted to be doing well, no recurrent stroke/TIA symptoms.  Continued on Keppra  XR 500 mg nightly for seizure prophylaxis.  1 year follow-up recommended.  Seen in follow-up by vascular surgeon Dr. Lanis on 11/14/2023.  Duplex at that time showed left ICA stent widely patent.  Right side ICA with critical stenosis, TCAR recommended.  Blood pressure was also noted to be elevated, 193/81.  He subsequently followed up with PCP Dr. Jolinda on 11/15/2023 and was started on losartan  25 mg daily.  He is continued on metoprolol  tartrate 25 mg twice daily.  Patient was seen for recheck on 11/29/2023, blood pressure 187/80 at that time.  Losartan  was increased to 50 mg twice daily.  Blood pressure was significantly better, 136/72, when seen by pulmonologist Dr. Annella on 12/03/2023 for follow-up of chronic cough (felt to be multifactorial, prior PFTs largely normal, recommended continue PPI and antihistamine).  Blood pressure elevated preadmission testing visit, 180/86.  Patient advised to continue medications as prescribed and  follow-up with PCP for management.  Preop labs reviewed, mild hypokalemia potassium 3.3, otherwise unremarkable.  EKG 10/08/23: Sinus rhythm with Premature supraventricular complexes. Rate 82. Left anterior fascicular block  TTE 10/07/23: 1. Left ventricular ejection fraction, by estimation, is 55 to 60%. The   left ventricle has normal function. The left ventricle has no regional  wall motion abnormalities. Left ventricular diastolic parameters are  consistent with Grade I diastolic  dysfunction (impaired relaxation).  2. Right ventricular systolic function is normal. The right ventricular  size is normal. Tricuspid regurgitation signal is inadequate for assessing  PA pressure.  3. The mitral valve is normal in structure. Mild mitral valve  regurgitation. No evidence of mitral stenosis.  4. The aortic valve is tricuspid. Aortic valve regurgitation is not  visualized. No aortic stenosis is present.   Nuclear stress 09/26/22: Normal perfusion images. The study is overall low risk. Note PVCs in bigeminy at peak stress.   No ST deviation was noted. Arrhythmias during stress: frequent PVCs in bigeminy at peak stress. Arrhythmias during recovery: occasional PACs, occasional PVCs.   LV perfusion is normal. There is no evidence of ischemia. There is no evidence of infarction.   Left ventricular function is normal. Nuclear stress EF: 66%. The left ventricular ejection fraction is hyperdynamic (>65%). End diastolic cavity size is normal. End systolic cavity size is normal. No evidence of transient ischemic dilation (TID) noted.   Prior study not available for comparison.   )         Anesthesia Quick Evaluation

## 2023-12-13 ENCOUNTER — Inpatient Hospital Stay (HOSPITAL_COMMUNITY): Admitting: Certified Registered Nurse Anesthetist

## 2023-12-13 ENCOUNTER — Encounter (HOSPITAL_COMMUNITY)
Admission: RE | Disposition: A | Discharge status: Home / Self Care | Payer: Self-pay | Source: Home / Self Care | Attending: Vascular Surgery

## 2023-12-13 ENCOUNTER — Inpatient Hospital Stay (HOSPITAL_COMMUNITY)
Admission: RE | Admit: 2023-12-13 | Discharge: 2023-12-14 | DRG: 036 | Disposition: A | Discharge status: Home / Self Care | Attending: Vascular Surgery | Admitting: Vascular Surgery

## 2023-12-13 ENCOUNTER — Encounter (HOSPITAL_COMMUNITY): Payer: Self-pay | Admitting: Vascular Surgery

## 2023-12-13 ENCOUNTER — Inpatient Hospital Stay (HOSPITAL_COMMUNITY)

## 2023-12-13 ENCOUNTER — Encounter (HOSPITAL_COMMUNITY): Payer: Self-pay | Admitting: Physician Assistant

## 2023-12-13 ENCOUNTER — Other Ambulatory Visit: Payer: Self-pay

## 2023-12-13 DIAGNOSIS — E785 Hyperlipidemia, unspecified: Secondary | ICD-10-CM | POA: Diagnosis present

## 2023-12-13 DIAGNOSIS — I110 Hypertensive heart disease with heart failure: Secondary | ICD-10-CM

## 2023-12-13 DIAGNOSIS — M549 Dorsalgia, unspecified: Secondary | ICD-10-CM | POA: Diagnosis present

## 2023-12-13 DIAGNOSIS — Z87891 Personal history of nicotine dependence: Secondary | ICD-10-CM | POA: Diagnosis not present

## 2023-12-13 DIAGNOSIS — I6521 Occlusion and stenosis of right carotid artery: Secondary | ICD-10-CM

## 2023-12-13 DIAGNOSIS — I6523 Occlusion and stenosis of bilateral carotid arteries: Principal | ICD-10-CM | POA: Diagnosis present

## 2023-12-13 DIAGNOSIS — J449 Chronic obstructive pulmonary disease, unspecified: Secondary | ICD-10-CM | POA: Diagnosis present

## 2023-12-13 DIAGNOSIS — Z8711 Personal history of peptic ulcer disease: Secondary | ICD-10-CM | POA: Diagnosis not present

## 2023-12-13 DIAGNOSIS — I5033 Acute on chronic diastolic (congestive) heart failure: Secondary | ICD-10-CM | POA: Diagnosis not present

## 2023-12-13 DIAGNOSIS — I10 Essential (primary) hypertension: Secondary | ICD-10-CM | POA: Diagnosis present

## 2023-12-13 DIAGNOSIS — I6529 Occlusion and stenosis of unspecified carotid artery: Secondary | ICD-10-CM | POA: Diagnosis present

## 2023-12-13 DIAGNOSIS — K219 Gastro-esophageal reflux disease without esophagitis: Secondary | ICD-10-CM | POA: Diagnosis present

## 2023-12-13 DIAGNOSIS — Z8673 Personal history of transient ischemic attack (TIA), and cerebral infarction without residual deficits: Secondary | ICD-10-CM

## 2023-12-13 DIAGNOSIS — F419 Anxiety disorder, unspecified: Secondary | ICD-10-CM | POA: Diagnosis present

## 2023-12-13 DIAGNOSIS — I6522 Occlusion and stenosis of left carotid artery: Principal | ICD-10-CM | POA: Diagnosis present

## 2023-12-13 DIAGNOSIS — G8929 Other chronic pain: Secondary | ICD-10-CM | POA: Diagnosis present

## 2023-12-13 HISTORY — PX: ULTRASOUND GUIDANCE FOR VASCULAR ACCESS: SHX6516

## 2023-12-13 HISTORY — PX: TRANSCAROTID ARTERY REVASCULARIZATIONÂ: SHX6778

## 2023-12-13 SURGERY — TRANSCAROTID ARTERY REVASCULARIZATION (TCAR)
Anesthesia: General | Site: Neck | Laterality: Right

## 2023-12-13 MED ORDER — ACETAMINOPHEN 650 MG RE SUPP: 325.0000 mg | RECTAL | Status: DC | PRN

## 2023-12-13 MED ORDER — LIDOCAINE-EPINEPHRINE (PF) 1 %-1:200000 IJ SOLN
INTRAMUSCULAR | Status: AC
  Filled 2023-12-13: qty 30

## 2023-12-13 MED ORDER — HEMOSTATIC AGENTS (NO CHARGE) OPTIME
TOPICAL | Status: DC | PRN
  Administered 2023-12-13: 2 via TOPICAL

## 2023-12-13 MED ORDER — IODIXANOL 320 MG/ML IV SOLN
INTRAVENOUS | Status: DC | PRN
  Administered 2023-12-13: 15 mL via INTRA_ARTERIAL

## 2023-12-13 MED ORDER — ROCURONIUM BROMIDE 10 MG/ML (PF) SYRINGE
PREFILLED_SYRINGE | INTRAVENOUS | Status: DC | PRN
  Administered 2023-12-13: 60 mg via INTRAVENOUS
  Administered 2023-12-13: 10 mg via INTRAVENOUS

## 2023-12-13 MED ORDER — SODIUM CHLORIDE 0.9 % IV SOLN: 500.0000 mL | Freq: Once | INTRAVENOUS | Status: DC | PRN

## 2023-12-13 MED ORDER — 0.9 % SODIUM CHLORIDE (POUR BTL) OPTIME
TOPICAL | Status: DC | PRN
Start: 2023-12-13 — End: 2023-12-13
  Administered 2023-12-13: 1000 mL

## 2023-12-13 MED ORDER — HYDRALAZINE HCL 20 MG/ML IJ SOLN: 5.0000 mg | INTRAMUSCULAR | Status: DC | PRN

## 2023-12-13 MED ORDER — LIDOCAINE HCL 1 % IJ SOLN: INTRAMUSCULAR | Status: DC | PRN

## 2023-12-13 MED ORDER — OXYCODONE-ACETAMINOPHEN 5-325 MG PO TABS: 1.0000 | ORAL_TABLET | ORAL | Status: DC | PRN

## 2023-12-13 MED ORDER — POTASSIUM CHLORIDE CRYS ER 20 MEQ PO TBCR
40.0000 meq | EXTENDED_RELEASE_TABLET | Freq: Every day | ORAL | Status: DC | PRN
  Administered 2023-12-14: 60 meq via ORAL
  Filled 2023-12-13: qty 3

## 2023-12-13 MED ORDER — ACETAMINOPHEN 500 MG PO TABS: 500.0000 mg | ORAL_TABLET | ORAL | Status: DC | PRN

## 2023-12-13 MED ORDER — MORPHINE SULFATE (PF) 2 MG/ML IV SOLN: 2.0000 mg | INTRAVENOUS | Status: DC | PRN

## 2023-12-13 MED ORDER — CHLORHEXIDINE GLUCONATE CLOTH 2 % EX PADS
6.0000 | MEDICATED_PAD | Freq: Once | CUTANEOUS | Status: AC
  Administered 2023-12-13: 6 via TOPICAL

## 2023-12-13 MED ORDER — ACETAMINOPHEN 500 MG PO TABS
1000.0000 mg | ORAL_TABLET | Freq: Once | ORAL | Status: AC
  Administered 2023-12-13: 1000 mg via ORAL
  Filled 2023-12-13: qty 2

## 2023-12-13 MED ORDER — FENTANYL CITRATE (PF) 250 MCG/5ML IJ SOLN
INTRAMUSCULAR | Status: DC | PRN
  Administered 2023-12-13: 25 ug via INTRAVENOUS
  Administered 2023-12-13: 100 ug via INTRAVENOUS

## 2023-12-13 MED ORDER — CHLORHEXIDINE GLUCONATE 0.12 % MT SOLN
15.0000 mL | Freq: Once | OROMUCOSAL | Status: AC
Start: 2023-12-13 — End: 2023-12-13
  Administered 2023-12-13: 15 mL via OROMUCOSAL
  Filled 2023-12-13: qty 15

## 2023-12-13 MED ORDER — TRAZODONE HCL 50 MG PO TABS
50.0000 mg | ORAL_TABLET | Freq: Every evening | ORAL | Status: DC | PRN
Start: 2023-12-13 — End: 2023-12-14
  Administered 2023-12-13: 50 mg via ORAL
  Filled 2023-12-13: qty 1

## 2023-12-13 MED ORDER — CEFAZOLIN SODIUM-DEXTROSE 2-4 GM/100ML-% IV SOLN
2.0000 g | INTRAVENOUS | Status: AC
  Administered 2023-12-13: 2 g via INTRAVENOUS
  Filled 2023-12-13: qty 100

## 2023-12-13 MED ORDER — PROPOFOL 10 MG/ML IV BOLUS
INTRAVENOUS | Status: AC
  Filled 2023-12-13: qty 20

## 2023-12-13 MED ORDER — PROPOFOL 10 MG/ML IV BOLUS
INTRAVENOUS | Status: DC | PRN
  Administered 2023-12-13: 130 mg via INTRAVENOUS

## 2023-12-13 MED ORDER — ONDANSETRON HCL 4 MG/2ML IJ SOLN: 4.0000 mg | Freq: Four times a day (QID) | INTRAMUSCULAR | Status: DC | PRN

## 2023-12-13 MED ORDER — CEFAZOLIN SODIUM-DEXTROSE 2-4 GM/100ML-% IV SOLN
2.0000 g | Freq: Three times a day (TID) | INTRAVENOUS | Status: AC
  Administered 2023-12-13 – 2023-12-14 (×2): 2 g via INTRAVENOUS
  Filled 2023-12-13 (×2): qty 100

## 2023-12-13 MED ORDER — DEXAMETHASONE SOD PHOSPHATE PF 10 MG/ML IJ SOLN
INTRAMUSCULAR | Status: DC | PRN
  Administered 2023-12-13: 10 mg via INTRAVENOUS

## 2023-12-13 MED ORDER — POLYETHYLENE GLYCOL 3350 17 G PO PACK
17.0000 g | PACK | Freq: Every day | ORAL | Status: DC | PRN
Start: 2023-12-13 — End: 2023-12-14

## 2023-12-13 MED ORDER — LABETALOL HCL 5 MG/ML IV SOLN
INTRAVENOUS | Status: DC | PRN
  Administered 2023-12-13 (×4): 5 mg via INTRAVENOUS

## 2023-12-13 MED ORDER — PHENOL 1.4 % MT LIQD
1.0000 | OROMUCOSAL | Status: DC | PRN
Start: 2023-12-13 — End: 2023-12-14

## 2023-12-13 MED ORDER — FENTANYL CITRATE (PF) 100 MCG/2ML IJ SOLN
INTRAMUSCULAR | Status: AC
  Filled 2023-12-13: qty 2

## 2023-12-13 MED ORDER — METOPROLOL TARTRATE 25 MG PO TABS
25.0000 mg | ORAL_TABLET | Freq: Two times a day (BID) | ORAL | Status: DC
  Administered 2023-12-13: 25 mg via ORAL
  Filled 2023-12-13 (×2): qty 1

## 2023-12-13 MED ORDER — LEVETIRACETAM ER 500 MG PO TB24
500.0000 mg | ORAL_TABLET | Freq: Every day | ORAL | Status: DC
  Administered 2023-12-13: 500 mg via ORAL
  Filled 2023-12-13: qty 1

## 2023-12-13 MED ORDER — HEPARIN 6000 UNIT IRRIGATION SOLUTION
Status: AC
  Filled 2023-12-13: qty 500

## 2023-12-13 MED ORDER — LIDOCAINE 2% (20 MG/ML) 5 ML SYRINGE
INTRAMUSCULAR | Status: DC | PRN
  Administered 2023-12-13: 60 mg via INTRAVENOUS

## 2023-12-13 MED ORDER — LIDOCAINE-EPINEPHRINE (PF) 1 %-1:200000 IJ SOLN
INTRAMUSCULAR | Status: DC | PRN
  Administered 2023-12-13: 5 mL via INTRADERMAL

## 2023-12-13 MED ORDER — FENTANYL CITRATE (PF) 250 MCG/5ML IJ SOLN
INTRAMUSCULAR | Status: AC
  Filled 2023-12-13: qty 5

## 2023-12-13 MED ORDER — GLYCOPYRROLATE 0.2 MG/ML IJ SOLN
INTRAMUSCULAR | Status: DC | PRN
  Administered 2023-12-13 (×2): .2 mg via INTRAVENOUS

## 2023-12-13 MED ORDER — EPHEDRINE SULFATE-NACL 50-0.9 MG/10ML-% IV SOSY
PREFILLED_SYRINGE | INTRAVENOUS | Status: DC | PRN
  Administered 2023-12-13: 5 mg via INTRAVENOUS
  Administered 2023-12-13: 10 mg via INTRAVENOUS

## 2023-12-13 MED ORDER — LACTATED RINGERS IV SOLN
INTRAVENOUS | Status: DC
Start: 2023-12-13 — End: 2023-12-13

## 2023-12-13 MED ORDER — ONDANSETRON HCL 4 MG/2ML IJ SOLN
INTRAMUSCULAR | Status: DC | PRN
  Administered 2023-12-13: 4 mg via INTRAVENOUS
  Administered 2023-12-13: 30 mg via INTRAVENOUS

## 2023-12-13 MED ORDER — METOPROLOL TARTRATE 5 MG/5ML IV SOLN: 2.5000 mg | INTRAVENOUS | Status: DC | PRN

## 2023-12-13 MED ORDER — ASPIRIN 81 MG PO TBEC
81.0000 mg | DELAYED_RELEASE_TABLET | Freq: Every day | ORAL | Status: DC
  Administered 2023-12-14: 81 mg via ORAL
  Filled 2023-12-13: qty 1

## 2023-12-13 MED ORDER — SERTRALINE HCL 50 MG PO TABS
50.0000 mg | ORAL_TABLET | Freq: Every day | ORAL | Status: DC
  Administered 2023-12-14: 50 mg via ORAL
  Filled 2023-12-13: qty 1

## 2023-12-13 MED ORDER — LOSARTAN POTASSIUM 50 MG PO TABS
50.0000 mg | ORAL_TABLET | Freq: Every day | ORAL | Status: DC
  Filled 2023-12-13: qty 1

## 2023-12-13 MED ORDER — AMISULPRIDE (ANTIEMETIC) 5 MG/2ML IV SOLN: 10.0000 mg | Freq: Once | INTRAVENOUS | Status: DC | PRN

## 2023-12-13 MED ORDER — BISACODYL 5 MG PO TBEC: 5.0000 mg | DELAYED_RELEASE_TABLET | Freq: Every day | ORAL | Status: DC | PRN

## 2023-12-13 MED ORDER — CLOPIDOGREL BISULFATE 75 MG PO TABS
75.0000 mg | ORAL_TABLET | Freq: Every day | ORAL | Status: DC
  Administered 2023-12-14: 75 mg via ORAL
  Filled 2023-12-13: qty 1

## 2023-12-13 MED ORDER — HEPARIN 6000 UNIT IRRIGATION SOLUTION
Status: DC | PRN
Start: 2023-12-13 — End: 2023-12-13
  Administered 2023-12-13: 1

## 2023-12-13 MED ORDER — DOCUSATE SODIUM 100 MG PO CAPS
100.0000 mg | ORAL_CAPSULE | Freq: Every day | ORAL | Status: DC
  Filled 2023-12-13: qty 1

## 2023-12-13 MED ORDER — SODIUM CHLORIDE 0.9 % IV SOLN: INTRAVENOUS | Status: AC

## 2023-12-13 MED ORDER — PANTOPRAZOLE SODIUM 40 MG PO TBEC
40.0000 mg | DELAYED_RELEASE_TABLET | Freq: Every day | ORAL | Status: DC
  Administered 2023-12-14: 40 mg via ORAL
  Filled 2023-12-13: qty 1

## 2023-12-13 MED ORDER — PHENYLEPHRINE HCL-NACL 20-0.9 MG/250ML-% IV SOLN
INTRAVENOUS | Status: AC
  Filled 2023-12-13: qty 250

## 2023-12-13 MED ORDER — ACETAMINOPHEN 325 MG PO TABS
325.0000 mg | ORAL_TABLET | ORAL | Status: DC | PRN
  Administered 2023-12-13: 650 mg via ORAL
  Filled 2023-12-13: qty 2

## 2023-12-13 MED ORDER — LIDOCAINE HCL (PF) 1 % IJ SOLN
INTRAMUSCULAR | Status: AC
  Filled 2023-12-13: qty 30

## 2023-12-13 MED ORDER — CLEVIDIPINE BUTYRATE 0.5 MG/ML IV EMUL
INTRAVENOUS | Status: DC | PRN
  Administered 2023-12-13: 1.5 mg/h via INTRAVENOUS

## 2023-12-13 MED ORDER — CHLORHEXIDINE GLUCONATE CLOTH 2 % EX PADS: 6.0000 | MEDICATED_PAD | Freq: Once | CUTANEOUS | Status: DC

## 2023-12-13 MED ORDER — FENTANYL CITRATE (PF) 100 MCG/2ML IJ SOLN
25.0000 ug | INTRAMUSCULAR | Status: DC | PRN
  Administered 2023-12-13: 50 ug via INTRAVENOUS

## 2023-12-13 MED ORDER — PHENYLEPHRINE HCL-NACL 20-0.9 MG/250ML-% IV SOLN
INTRAVENOUS | Status: DC | PRN
  Administered 2023-12-13: 25 ug/min via INTRAVENOUS

## 2023-12-13 MED ORDER — PROTAMINE SULFATE 10 MG/ML IV SOLN
INTRAVENOUS | Status: DC | PRN
  Administered 2023-12-13: 50 mg via INTRAVENOUS

## 2023-12-13 MED ORDER — HEPARIN SODIUM (PORCINE) 1000 UNIT/ML IJ SOLN
INTRAMUSCULAR | Status: DC | PRN
  Administered 2023-12-13: 10000 [IU] via INTRAVENOUS

## 2023-12-13 MED ORDER — SODIUM CHLORIDE 0.9 % IV SOLN: INTRAVENOUS | Status: DC

## 2023-12-13 MED ORDER — ROSUVASTATIN CALCIUM 20 MG PO TABS
20.0000 mg | ORAL_TABLET | Freq: Every day | ORAL | Status: DC
  Administered 2023-12-14: 20 mg via ORAL
  Filled 2023-12-13: qty 1

## 2023-12-13 MED ORDER — TAMSULOSIN HCL 0.4 MG PO CAPS
0.4000 mg | ORAL_CAPSULE | Freq: Every day | ORAL | Status: DC
  Administered 2023-12-13: 0.4 mg via ORAL
  Filled 2023-12-13: qty 1

## 2023-12-13 MED ORDER — ORAL CARE MOUTH RINSE: 15.0000 mL | Freq: Once | OROMUCOSAL | Status: AC

## 2023-12-13 MED ORDER — SUGAMMADEX SODIUM 200 MG/2ML IV SOLN
INTRAVENOUS | Status: DC | PRN
Start: 2023-12-13 — End: 2023-12-13
  Administered 2023-12-13: 184.2 mg via INTRAVENOUS

## 2023-12-13 MED ORDER — LABETALOL HCL 5 MG/ML IV SOLN: 10.0000 mg | INTRAVENOUS | Status: DC | PRN

## 2023-12-13 SURGICAL SUPPLY — 40 items
BAG BANDED W/RUBBER/TAPE 36X54 (MISCELLANEOUS) ×2 IMPLANT
BAG COUNTER SPONGE SURGICOUNT (BAG) ×2 IMPLANT
CANISTER SUCTION 3000ML PPV (SUCTIONS) ×2 IMPLANT
CATH BALLN ENROUTE 5X35 (CATHETERS) IMPLANT
CATH ROBINSON RED A/P 18FR (CATHETERS) IMPLANT
CLIP TI MEDIUM 6 (CLIP) ×4 IMPLANT
CLIP TI WIDE RED SMALL 6 (CLIP) ×4 IMPLANT
COVER DOME SNAP 22 D (MISCELLANEOUS) ×2 IMPLANT
COVER PROBE W GEL 5X96 (DRAPES) ×2 IMPLANT
DERMABOND ADVANCED .7 DNX12 (GAUZE/BANDAGES/DRESSINGS) ×2 IMPLANT
DRAPE FEMORAL ANGIO 80X135IN (DRAPES) ×2 IMPLANT
ELECTRODE REM PT RTRN 9FT ADLT (ELECTROSURGICAL) ×2 IMPLANT
GOWN STRL REUS W/ TWL LRG LVL3 (GOWN DISPOSABLE) ×4 IMPLANT
GOWN STRL REUS W/TWL 2XL LVL3 (GOWN DISPOSABLE) ×4 IMPLANT
GUIDEWIRE ENROUTE 0.014 (WIRE) ×2 IMPLANT
HEMOSTAT SNOW SURGICEL 2X4 (HEMOSTASIS) IMPLANT
KIT BASIN OR (CUSTOM PROCEDURE TRAY) ×2 IMPLANT
KIT ENCORE 26 ADVANTAGE (KITS) ×2 IMPLANT
KIT INTRODUCER GALT 7 (INTRODUCER) ×2 IMPLANT
KIT TURNOVER KIT B (KITS) ×2 IMPLANT
NDL HYPO 25GX1X1/2 BEV (NEEDLE) IMPLANT
NEEDLE HYPO 25GX1X1/2 BEV (NEEDLE) ×2 IMPLANT
PACK CAROTID (CUSTOM PROCEDURE TRAY) ×2 IMPLANT
POSITIONER HEAD DONUT 9IN (MISCELLANEOUS) ×2 IMPLANT
SET MICROPUNCTURE 5F STIFF (MISCELLANEOUS) ×2 IMPLANT
SOLN STERILE WATER BTL 1000 ML (IV SOLUTION) ×2 IMPLANT
STATION PROTECTION PRESSURIZED (MISCELLANEOUS) ×2 IMPLANT
STENT SYS TRANSCAROTID 10-8X40 (Permanent Stent) IMPLANT
SUT MNCRL AB 4-0 PS2 18 (SUTURE) ×2 IMPLANT
SUT PROLENE 5 0 C 1 24 (SUTURE) ×2 IMPLANT
SUT SILK 2 0 PERMA HAND 18 BK (SUTURE) IMPLANT
SUT SILK 2 0 SH (SUTURE) ×2 IMPLANT
SUT SILK 3-0 18XBRD TIE 12 (SUTURE) IMPLANT
SUT VIC AB 3-0 SH 27X BRD (SUTURE) ×2 IMPLANT
SYR 10ML LL (SYRINGE) ×6 IMPLANT
SYR 20ML LL LF (SYRINGE) ×2 IMPLANT
SYR CONTROL 10ML LL (SYRINGE) IMPLANT
SYSTEM TRANSCAROTID NEUROPRTCT (MISCELLANEOUS) ×2 IMPLANT
TOWEL GREEN STERILE (TOWEL DISPOSABLE) ×2 IMPLANT
WIRE BENTSON .035X145CM (WIRE) ×2 IMPLANT

## 2023-12-13 NOTE — H&P (Signed)
 Office Note   Patient seen and examined in preop holding.  No complaints. No changes to medication history or physical exam since last seen in clinic. After discussing the risks and benefits of right sided TCAR for asymptomatic critical ICA stenosis, Eric Cook elected to proceed.   Fonda FORBES Rim MD   HPI: Eric Cook is a 78 y.o. ([birth year]) male presenting in follow-up status post 10/14/2023 left TCAR for symptomatic ICA stenosis.  On exam, Eric Cook was doing well, accompanied by his son.  His wife, who also recently had surgery was also doing well. Eric Cook denies new TIA, stroke, amaurosis.  As discussed while he was inpatient, he has severe, asymptomatic, right-sided ICA stenosis as well.  The pt is  on a statin for cholesterol management.  The pt is  on a Eric aspirin .   Other AC:  plavix  The pt is  on medication for hypertension.   The pt is  not diabetic.  Tobacco hx:  former  Past Medical History:  Diagnosis Date   Anxiety    Carotid artery occlusion    Chronic back pain    lumbar   COPD (chronic obstructive pulmonary disease) (HCC)    GERD (gastroesophageal reflux disease)    HLD (hyperlipidemia)    Hyperlipidemia    Hypertension    Peptic ulcer    Seizures (HCC)    medication related   Stroke [facility])     Past Surgical History:  Procedure Laterality Date   COLONOSCOPY     LUMBAR DISC SURGERY     L3-L4   TRANSCAROTID ARTERY REVASCULARIZATION  Left 10/14/2023   Procedure: LEFT TRANSCAROTID ARTERY REVASCULARIZATION;  Surgeon: Rim Fonda FORBES, MD;  Location: [facility] OR;  Service: Vascular;  Laterality: Left;   ULTRASOUND GUIDANCE FOR VASCULAR ACCESS Left 10/14/2023   Procedure: ULTRASOUND GUIDANCE, FOR VASCULAR ACCESS;  Surgeon: Rim Fonda FORBES, MD;  Location: [facility] OR;  Service: Vascular;  Laterality: Left;   UPPER GASTROINTESTINAL ENDOSCOPY      Social History   Socioeconomic History   Marital status: Single    Spouse name: Not on file   Number of children: 1    Years of education: Not on file   Highest education level: Some college, no degree  Occupational History   Occupation: lobbyist    Comment: part time  Tobacco Use   Smoking status: Former    Current packs/day: 0.00    Average packs/day: 1 pack/day for 54.0 years (54.0 ttl pk-yrs)    Types: Cigarettes    Start date: 04/1965    Quit date: 04/2019    Years since quitting: 4.6   Smokeless tobacco: Never  Vaping Use   Vaping status: Never Used  Substance and Sexual Activity   Alcohol use: Never   Drug use: Never   Sexual activity: Never  Other Topics Concern   Not on file  Social History Narrative   Lives alone   Right Handed   Drinks 2-3 cups of caffeine Eric   Son in [place]   Social Drivers of Health   Financial Resource Strain: Low Risk  (11/14/2023)   Overall Financial Resource Strain (CARDIA)    Difficulty of Paying Living Expenses: Not very hard  Food Insecurity: No Food Insecurity (11/14/2023)   Hunger Vital Sign    Worried About Running Out of Food in the Last Year: Never true    Ran Out of Food in the Last Year: Never true  Transportation Needs: No Transportation Needs (11/14/2023)  PRAPARE - Administrator, Civil Service (Medical): No    Lack of Transportation (Non-Medical): No  Physical Activity: Sufficiently Active (11/14/2023)   Exercise Vital Sign    Days of Exercise per Week: 6 days    Minutes of Exercise per Session: 60 min  Stress: Stress Concern Present (11/14/2023)   Eric Cook of Occupational Health - Occupational Stress Questionnaire    Feeling of Stress: To some extent  Social Connections: Unknown (11/14/2023)   Social Connection and Isolation Panel    Frequency of Communication with Friends and Family: More than three times a week    Frequency of Social Gatherings with Friends and Family: Once a week    Attends Religious Services: Patient declined    Database Administrator or Organizations: Patient  declined    Attends Engineer, Structural: Not on file    Marital Status: Divorced  Recent Concern: Social Connections - Socially Isolated (10/06/2023)   Social Connection and Isolation Panel    Frequency of Communication with Friends and Family: More than three times a week    Frequency of Social Gatherings with Friends and Family: Twice a week    Attends Religious Services: Patient declined    Database Administrator or Organizations: No    Attends Engineer, Structural: Not on file    Marital Status: Divorced  Intimate Partner Violence: Not At Risk (10/17/2023)   Humiliation, Afraid, Rape, and Kick questionnaire    Fear of Current or Ex-Partner: No    Emotionally Abused: No    Physically Abused: No    Sexually Abused: No   Family History  Problem Relation Age of Onset   Colon polyps Brother 41   Colon cancer Brother    Colon polyps Son 55   Other Son        vertigo   Kidney Stones Son    Esophageal cancer Neg Hx    Pancreatic cancer Neg Hx    Stomach cancer Neg Hx    Rectal cancer Neg Hx     Current Facility-Administered Medications  Medication Dose Route Frequency Provider Last Rate Last Admin   0.9 %  sodium chloride  infusion   Intravenous Continuous Maraya Gwilliam E, MD       ceFAZolin  (ANCEF ) IVPB 2g/100 mL premix  2 g Intravenous 30 min Pre-Op Nimco Bivens E, MD       Chlorhexidine  Gluconate Cloth 2 % PADS 6 each  6 each Topical Once  Zeringue E, MD       lactated ringers  infusion   Intravenous Continuous Stoltzfus, Gregory P, DO        No Known Allergies   REVIEW OF SYSTEMS:  [X]  denotes positive finding, [ ]  denotes negative finding Cardiac  Comments:  Chest pain or chest pressure:    Shortness of breath upon exertion:    Short of breath when lying flat:    Irregular heart rhythm:        Vascular    Pain in calf, thigh, or hip brought on by ambulation:    Pain in feet at night that wakes you up from your sleep:     Blood clot in your  veins:    Leg swelling:         Pulmonary    Oxygen  at home:    Productive cough:     Wheezing:         Neurologic    Sudden weakness in arms or legs:  Sudden numbness in arms or legs:     Sudden onset of difficulty speaking or slurred speech:    Temporary loss of vision in one eye:     Problems with dizziness:         Gastrointestinal    Blood in stool:     Vomited blood:         Genitourinary    Burning when urinating:     Blood in urine:        Psychiatric    Major depression:         Hematologic    Bleeding problems:    Problems with blood clotting too easily:        Skin    Rashes or ulcers:        Constitutional    Fever or chills:      PHYSICAL EXAMINATION:  Vitals:   12/13/23 0555  BP: (!) 166/81  Pulse: 66  Resp: 18  Temp: 98.2 F (36.8 C)  TempSrc: Oral  SpO2: 97%  Weight: 92.1 kg  Height: 5' 9 (1.753 m)    General:  WDWN in NAD; vital signs documented above Gait: Not observed HENT: WNL, normocephalic Pulmonary: normal non-labored breathing , without wheezing Cardiac: regular HR Abdomen: soft, NT, no masses Skin: without rashes Vascular Exam/Pulses:  Right Left  Radial 2+ (normal) 2+ (normal)  Ulnar    Femoral    Popliteal    DP    PT     Extremities: without ischemic changes, without Gangrene , without cellulitis; without open wounds;  Musculoskeletal: no muscle wasting or atrophy  Neurologic: A&O X 3;  No focal weakness or paresthesias are detected Psychiatric:  The pt has Normal affect.   Non-Invasive Vascular Imaging:   Right Carotid Findings:  +----------+-------+--------+--------+-----------------------+-------------  ----+           PSV    EDV cm/sStenosisPlaque Description     Comments                      cm/s                                                              +----------+-------+--------+--------+-----------------------+-------------  ----+  CCA Prox  49     9                                       intimal                                                                     thickening          +----------+-------+--------+--------+-----------------------+-------------  ----+  CCA Distal28     5                                      intimal  thickening          +----------+-------+--------+--------+-----------------------+-------------  ----+  ICA Prox  565    137     80-99%  calcific and                                                                heterogenous                               +----------+-------+--------+--------+-----------------------+-------------  ----+  ICA Mid   122    0       40-59%                                             +----------+-------+--------+--------+-----------------------+-------------  ----+  ICA Distal58     11                                                         +----------+-------+--------+--------+-----------------------+-------------  ----+  ECA      65     8               heterogenous                               +----------+-------+--------+--------+-----------------------+-------------  ----+   +----------+--------+-------+----------------+-------------------+           PSV cm/sEDV cmsDescribe        Arm Pressure (mmHG)  +----------+--------+-------+----------------+-------------------+  [id number]     0      Multiphasic, [id number]                  +----------+--------+-------+----------------+-------------------+   +---------+--------+--+--------+-+---------+  VertebralPSV cm/s33EDV cm/s7Antegrade  +---------+--------+--+--------+-+---------+      Left Carotid Findings:  +----------+--------+--------+--------+------------------+-----------------  -+           PSV cm/sEDV cm/sStenosisPlaque DescriptionComments              +----------+--------+--------+--------+------------------+-----------------  -+  CCA Prox  86      7                                 intimal  thickening  +----------+--------+--------+--------+------------------+-----------------  -+  CCA Distal54      6                                 intimal  thickening  +----------+--------+--------+--------+------------------+-----------------  -+  ICA Prox  41      9       1-39%                                          +----------+--------+--------+--------+------------------+-----------------  -+  ICA Mid   69  14                                                     +----------+--------+--------+--------+------------------+-----------------  -+  ICA Distal53      9                                                      +----------+--------+--------+--------+------------------+-----------------  -+  ECA      77      0               heterogenous                           +----------+--------+--------+--------+------------------+-----------------  -+   +----------+--------+--------+----------------+-------------------+           PSV cm/sEDV cm/sDescribe        Arm Pressure (mmHG)  +----------+--------+--------+----------------+-------------------+  Subclavian80     0       Multiphasic, TWO829                  +----------+--------+--------+----------------+-------------------+   +---------+--------+--+--------+--+---------+  VertebralPSV cm/s43EDV cm/s11Antegrade  +---------+--------+--+--------+--+---------+      Left Stent(s):  +----------------------------+--------+--------+------------+--------+-----  ----+  CCA distal to ICA distal    PSV cm/sEDV cm/sStenosis     WaveformComments   stent                                                                       +----------------------------+--------+--------+------------+--------+-----  ----+  Prox to  Stent               48      8       <50%                                                                        stenosis                        +----------------------------+--------+--------+------------+--------+-----  ----+  Proximal Stent              54      6                                       +----------------------------+--------+--------+------------+--------+-----  ----+  Mid Stent                   41      9                                       +----------------------------+--------+--------+------------+--------+-----  ----+  Distal Stent                54      9                           ICA                                                                          distal     +----------------------------+--------+--------+------------+--------+-----  ----+   Unable to visualize the distal stent and distal to stent.    Summary:  Right Carotid: Velocities in the right ICA are consistent with a 80-99%                 stenosis. The ECA appears <50% stenosed.   Left Carotid: The ECA appears <50% stenosed. Patent left CCA distal to ICA                distal stent. <50% stenosis is seen within the stent. Unable  to               visualize distal stent and distal to stent.   Vertebrals:  Bilateral vertebral arteries demonstrate antegrade flow.  Subclavians: Normal flow hemodynamics were seen in bilateral subclavian               arteries.   *See table(s) above for measurements and observations.        ASSESSMENT/PLAN: RANDI COLLEGE is a 78 y.o. male presenting in follow-up status post 10/14/2023 left TCAR for symptomatic ICA stenosis.  He has recovered well.  The incision site has healed.  He has no new deficits.  Carotid duplex ultrasound was reviewed.  The stent is widely patent. Right sided internal carotid artery stenosis is critical-velocities over 500.  I am worried that waiting 3 months would result in  stroke  During his visit today we discussed right sided carotid intervention for asymptomatic critical stenosis.  We discussed both carotid endarterectomy versus transcarotid artery stenting.  He elected to move forward with transcarotid artery stenting as he had a good experience with the operation roughly 1 month ago.  I would like for him to have the operation after at least 6 weeks to decrease the risk of hyperperfusion syndrome.   Fonda FORBES Rim, MD [facility] [phone]

## 2023-12-13 NOTE — Progress Notes (Signed)
Pt arrived from ...PACU.., A/ox ..4.pt denies any pain, MD aware,CCMD called. CHG bath given,no further needs at this time    

## 2023-12-13 NOTE — Plan of Care (Signed)
  Problem: Education: Goal: Knowledge of General Education information will improve Description: Including pain rating scale, medication(s)/side effects and non-pharmacologic comfort measures Outcome: Progressing   Problem: Health Behavior/Discharge Planning: Goal: Ability to manage health-related needs will improve Outcome: Progressing   Problem: Ischemic Stroke/TIA Tissue Perfusion: Goal: Complications of ischemic stroke/TIA will be minimized Outcome: Progressing

## 2023-12-13 NOTE — Transfer of Care (Signed)
 Immediate Anesthesia Transfer of Care Note  Patient: Eric Cook  Procedure(s) Performed: RIGHT TRANSCAROTID ARTERY REVASCULARIZATION (TCAR) (Right: Neck) ULTRASOUND GUIDANCE, FOR VASCULAR ACCESS LEFT FEMORAL VEIN (Left: Groin)  Patient Location: PACU  Anesthesia Type:General  Level of Consciousness: awake, alert , and oriented  Airway & Oxygen  Therapy: Patient Spontanous Breathing and Patient connected to nasal cannula oxygen   Post-op Assessment: Report given to RN and Post -op Vital signs reviewed and stable  Post vital signs: Reviewed and stable  Last Vitals:  Vitals Value Taken Time  BP 127/70 12/13/23 09:52  Temp 98   Pulse 80 12/13/23 09:53  Resp 15 12/13/23 09:53  SpO2 94 % 12/13/23 09:53  Vitals shown include unfiled device data.  Last Pain:  Vitals:   12/13/23 0620  TempSrc:   PainSc: 0-No pain         Complications: No notable events documented.

## 2023-12-13 NOTE — Op Note (Signed)
 NAME: Eric Cook    MRN: [id number] DOB: July 22, 1945    DATE OF OPERATION: 12/13/2023  PREOP DIAGNOSIS:    Right critical internal carotid artery stenosis  POSTOP DIAGNOSIS:    Same  PROCEDURE:    Right Transcarotid artery revascularization 5 x 35 balloon angioplasty, 10 - 8 - 40 mm tapered enroute stent  SURGEON: Fonda FORBES Rim  ASSIST: Donnice Sender, PA  ANESTHESIA: General  EBL: 10 mL  INDICATIONS:    Eric Cook is a 78 y.o. male presenting with right sided asymptomatic critical ICA stenosis.  He has previous history of left-sided TCAR for symptomatic ICA stenosis.  After discussing risks and benefits of right sided TCAR, Ray elected to proceed.  FINDINGS:   Greater than 95% stenosis of the proximal right internal carotid artery  TECHNIQUE:   The patient was brought to the operating room, where support lines were placed and general anesthesia was secured. The right neck and left groin were prepped and the patient was sterilely draped. A transverse 2-4 cm incision was made between the sternal and clavicular heads of the sternocleidomastoid muscle, below the omohyoid. Following longitudinal division of the carotid sheath the jugular vein was partially dissected and retracted medially. Once 3 cm of common carotid artery (CCA) were isolated, umbilical tape was placed around the proximal 1/3 of the CCA under direct vision. A 5.0 polypropylene suture was pre-placed in the anterior wall of the CCA, in a "U stitch" configuration, close to the clavicle to facilitate hemostasis upon removal of the arterial sheath at completion of the TCAR procedure.  The contralateral (left) common femoral vein (CFV) was accessed under ultrasound guidance, using standard Seldinger and micropuncture access technique. Permanent recorded image(s) was/were saved in the patient's medical record. The Venous Return Sheath was advanced into the CFV over the 0.035" wire provided. Blood was  aspirated from the flow line followed by flushing of the Venous Sheath with heparinized saline. The Venous Sheath was secured to the patient's skin with suture to maintain optimal position in the vessel.  Heparin  was given to obtain a therapeutic activated clotting time >250 seconds prior to arterial access. A 4-French non-stiffened ENHANCE Transcarotid / Peripheral Access set was used, puncturing the artery with the 21G needle through the pre-placed "U" stitch while holding gentle traction on the umbilical tape to stabilize and centralize the CCA within the incision. Careful attention was paid to the change in CCA shape when using the umbilical tape to control or lift the artery. The micropuncture wire was then advanced 3-4 cm into the CCA and, the 21G needle was removed. The micropuncture sheath was advanced 2-3 cm into the CCA and the wire and dilator were removed. Pulsatile backflow indicated correct positioning. The provided 0.035 J-tipped guidewire was inserted as close as possible to the bifurcation without engaging the lesion. After micropuncture sheath removal, the Transcarotid Arterial Sheath was advanced to the 2.5cm marker and the 0.035" wire and dilator were then removed. Arterial Sheath position was assessed under fluoroscopy in two projections to ensure that the sheath tip was oriented coaxially in the CCA. The Arterial Sheath was sutured to the patient with gentle forward tension. Blood was slowly aspirated followed by flushing with heparinized saline. No ingress of air bubbles through the passive hemostatic valve was observed. The stopcocks were closed. Traction applied to the CCA previously to facilitate access was gently released.  The Flow Controller was connected to the Transcarotid Arterial Sheath, prepared by passively allowing a  column of arterial blood to fill the line and connected to the Venous Return Sheath. CCA inflow was occluded proximal to the arteriotomy with a vascular clamp  to achieve active flow reversal. To confirm flow reversal, a saline bolus was delivered into the venous flow line on both "High" and "Low" flow settings of the Flow Controller. Angiograms were performed with slow injections of a small amount of contrast filling just past the lesion to minimize antegrade transmission of micro-bubbles.  Prior to lesion manipulation, heart rate (70bpm) and systolic BP (140-123mmHg) were managed upwards to optimize flow reversal and procedural neuroprotection. The lesion was crossed with an 0.014" ENROUTE guidewire and pre-dilation of the lesion was performed with a 5mm x 35mm rapid exchange 0.014" compatible balloon catheter to 8 atmospheres for 10 seconds. Stenting was performed with an 8-18mm x 40mm tapered ENROUTE Transcarotid stent, sized appropriately to the right CCA. AP and lateral angiograms (gentle contrast injections) were performed to confirm stent placement and arterial wall stent apposition.  At [facility] case completion, antegrade flow was restored by releasing the clamp on the CCA then closing the NPS stopcocks to the flow lines. The Transcarotid Arterial Sheath was removed and the pre-closure suture was tied. Heparin  reversal was employed. The wound bed was closed in layers using 3.0 vicryl suture, with monocryl and dermabond at the level of the skin.   The Venous Return Sheath was removed and hemostasis was achieved with brief manual compression.  The patient tolerated the procedure well and was extubated on the table. The patient was moving all four extremities to command prior to transfer to the recovery room.   Fonda FORBES Rim, MD [facility] DATE OF DICTATION:   12/13/2023

## 2023-12-13 NOTE — Anesthesia Procedure Notes (Signed)
 Arterial Line Insertion Start/End11/21/2025 7:15 AM, 12/13/2023 7:20 AM Performed by: Zelphia Norleen HERO, CRNA, CRNA  Patient location: Pre-op. Preanesthetic checklist: patient identified, IV checked, site marked, risks and benefits discussed, surgical consent, monitors and equipment checked, pre-op evaluation, timeout performed and anesthesia consent Lidocaine  1% used for infiltration Left, radial was placed Catheter size: 20 G Hand hygiene performed  and maximum sterile barriers used   Attempts: 2 Procedure performed using ultrasound to evaluate access site. Ultrasound Notes:relevant anatomy identified, ultrasound used to visualize needle entry and vessel patent under ultrasound. Following insertion, dressing applied and Biopatch. Post procedure assessment: normal and unchanged  Post procedure complications: unsuccessful attempts. Patient tolerated the procedure well with no immediate complications.

## 2023-12-13 NOTE — Anesthesia Procedure Notes (Signed)
 Procedure Name: Intubation Date/Time: 12/13/2023 8:00 AM  Performed by: Zelphia Norleen HERO, CRNAPre-anesthesia Checklist: Patient identified, Emergency Drugs available, Suction available and Patient being monitored Patient Re-evaluated:Patient Re-evaluated prior to induction Oxygen  Delivery Method: Circle system utilized Preoxygenation: Pre-oxygenation with 100% oxygen  Induction Type: IV induction Ventilation: Mask ventilation without difficulty Laryngoscope Size: Mac and 3 Grade View: Grade II Tube type: Oral Tube size: 7.0 mm Number of attempts: 1 Airway Equipment and Method: Stylet and Oral airway Placement Confirmation: ETT inserted through vocal cords under direct vision, positive ETCO2 and breath sounds checked- equal and bilateral Secured at: 23 cm Tube secured with: Tape Dental Injury: Teeth and Oropharynx as per pre-operative assessment

## 2023-12-14 DIAGNOSIS — Z48812 Encounter for surgical aftercare following surgery on the circulatory system: Secondary | ICD-10-CM

## 2023-12-14 LAB — CBC
HCT: 31.1 % — ABNORMAL LOW (ref 39.0–52.0)
Hemoglobin: 11.5 g/dL — ABNORMAL LOW (ref 13.0–17.0)
MCH: 33.2 pg (ref 26.0–34.0)
MCHC: 37 g/dL — ABNORMAL HIGH (ref 30.0–36.0)
MCV: 89.9 fL (ref 80.0–100.0)
Platelets: 177 K/uL (ref 150–400)
RBC: 3.46 MIL/uL — ABNORMAL LOW (ref 4.22–5.81)
RDW: 12.2 % (ref 11.5–15.5)
WBC: 8.2 K/uL (ref 4.0–10.5)
nRBC: 0 % (ref 0.0–0.2)

## 2023-12-14 LAB — BASIC METABOLIC PANEL WITH GFR
Anion gap: 10 (ref 5–15)
BUN: 15 mg/dL (ref 8–23)
CO2: 24 mmol/L (ref 22–32)
Calcium: 8.8 mg/dL — ABNORMAL LOW (ref 8.9–10.3)
Chloride: 104 mmol/L (ref 98–111)
Creatinine, Ser: 1.17 mg/dL (ref 0.61–1.24)
GFR, Estimated: >60 mL/min (ref >60)
Glucose, Bld: 112 mg/dL — ABNORMAL HIGH (ref 70–99)
Potassium: 2.9 mmol/L — ABNORMAL LOW (ref 3.5–5.1)
Sodium: 138 mmol/L (ref 135–145)

## 2023-12-14 MED ORDER — TRAMADOL HCL 50 MG PO TABS: 50.0000 mg | ORAL_TABLET | Freq: Four times a day (QID) | ORAL | 0 refills | Status: DC | PRN

## 2023-12-14 NOTE — Progress Notes (Signed)
  Progress Note    12/14/2023 9:06 AM 1 Day Post-Op  Subjective:  no complaints.  No neuro events overnight   Vitals:   12/14/23 0731 12/14/23 0843  BP: 117/63 (!) 95/54  Pulse: 68   Resp: 16   Temp: 98.1 F (36.7 C)   SpO2: 94%    Physical Exam: Lungs:  non labored Incisions:  R neck incision c/d/i Extremities:  moving all ext well Neurologic: CN grossly intact  CBC    Component Value Date/Time   WBC 8.2 12/14/2023 0435   RBC 3.46 (L) 12/14/2023 0435   HGB 11.5 (L) 12/14/2023 0435   HGB 13.3 07/01/2023 1107   HCT 31.1 (L) 12/14/2023 0435   HCT 41.4 07/01/2023 1107   PLT 177 12/14/2023 0435   PLT 229 07/01/2023 1107   MCV 89.9 12/14/2023 0435   MCV 97 07/01/2023 1107   MCH 33.2 12/14/2023 0435   MCHC 37.0 (H) 12/14/2023 0435   RDW 12.2 12/14/2023 0435   RDW 13.7 07/01/2023 1107   LYMPHSABS 1.2 10/05/2023 2008   LYMPHSABS 1.3 07/01/2023 1107   MONOABS 0.9 10/05/2023 2008   EOSABS 0.1 10/05/2023 2008   EOSABS 0.2 07/01/2023 1107   BASOSABS 0.1 10/05/2023 2008   BASOSABS 0.0 07/01/2023 1107    BMET    Component Value Date/Time   NA 138 12/14/2023 0435   NA 143 11/29/2023 0933   K 2.9 (L) 12/14/2023 0435   CL 104 12/14/2023 0435   CO2 24 12/14/2023 0435   GLUCOSE 112 (H) 12/14/2023 0435   BUN 15 12/14/2023 0435   BUN 14 11/29/2023 0933   CREATININE 1.17 12/14/2023 0435   CALCIUM  8.8 (L) 12/14/2023 0435   GFRNONAA >60 12/14/2023 0435   GFRAA 77 09/08/2019 1620    INR    Component Value Date/Time   INR 1.0 10/05/2023 2008     Intake/Output Summary (Last 24 hours) at 12/14/2023 0906 Last data filed at 12/14/2023 0458 Gross per 24 hour  Intake 2250 ml  Output 2195 ml  Net 55 ml     Assessment/Plan:  78 y.o. male is s/p R TCAR 1 Day Post-Op   No neuro events overnight R neck incision well appearing without hematoma Continue aspirin , plavix , statin Ambulating well; ok for discharge home.  Office will arrange carotid duplex in 1  month   Donnice Sender, [place] [facility] [phone] 12/14/2023 9:06 AM  VASCULAR STAFF ADDENDUM: I have independently interviewed and examined the patient. I agree with the above.  Doing well this morning.  No deficits Incision site soft, no hematoma Home meds, can hold ARB for another day prior to restarting. He was asked to acute to continue his other medications.  Fonda FORBES Rim MD [facility] Phone Number: [phone] 12/14/2023 9:06 AM

## 2023-12-14 NOTE — Anesthesia Postprocedure Evaluation (Signed)
 Anesthesia Post Note  Patient: Eric Cook  Procedure(s) Performed: RIGHT TRANSCAROTID ARTERY REVASCULARIZATION (TCAR) (Right: Neck) ULTRASOUND GUIDANCE, FOR VASCULAR ACCESS LEFT FEMORAL VEIN (Left: Groin)     Patient location during evaluation: PACU Anesthesia Type: General Level of consciousness: awake and alert Pain management: pain level controlled Vital Signs Assessment: post-procedure vital signs reviewed and stable Respiratory status: spontaneous breathing, nonlabored ventilation, respiratory function stable and patient connected to nasal cannula oxygen  Cardiovascular status: blood pressure returned to baseline and stable Postop Assessment: no apparent nausea or vomiting Anesthetic complications: no   No notable events documented.  Last Vitals:  Vitals:   12/14/23 0529 12/14/23 0731  BP: (!) 126/55 117/63  Pulse: 62 68  Resp: 16 16  Temp: 36.8 C 36.7 C  SpO2: 91% 94%    Last Pain:  Vitals:   12/14/23 0731  TempSrc: Oral  PainSc:                  Johsua Shevlin E

## 2023-12-14 NOTE — Progress Notes (Signed)
  Progress Note    12/14/2023 8:42 AM 1 Day Post-Op  Subjective:  no complaints.  No neuro events overnight   Vitals:   12/14/23 0529 12/14/23 0731  BP: (!) 126/55 117/63  Pulse: 62 68  Resp: 16 16  Temp: 98.3 F (36.8 C) 98.1 F (36.7 C)  SpO2: 91% 94%   Physical Exam: Lungs:  non labored Incisions:  R neck incision c/d/i Extremities:  moving all ext well Neurologic: CN grossly intact  CBC    Component Value Date/Time   WBC 8.2 12/14/2023 0435   RBC 3.46 (L) 12/14/2023 0435   HGB 11.5 (L) 12/14/2023 0435   HGB 13.3 07/01/2023 1107   HCT 31.1 (L) 12/14/2023 0435   HCT 41.4 07/01/2023 1107   PLT 177 12/14/2023 0435   PLT 229 07/01/2023 1107   MCV 89.9 12/14/2023 0435   MCV 97 07/01/2023 1107   MCH 33.2 12/14/2023 0435   MCHC 37.0 (H) 12/14/2023 0435   RDW 12.2 12/14/2023 0435   RDW 13.7 07/01/2023 1107   LYMPHSABS 1.2 10/05/2023 2008   LYMPHSABS 1.3 07/01/2023 1107   MONOABS 0.9 10/05/2023 2008   EOSABS 0.1 10/05/2023 2008   EOSABS 0.2 07/01/2023 1107   BASOSABS 0.1 10/05/2023 2008   BASOSABS 0.0 07/01/2023 1107    BMET    Component Value Date/Time   NA 138 12/14/2023 0435   NA 143 11/29/2023 0933   K 2.9 (L) 12/14/2023 0435   CL 104 12/14/2023 0435   CO2 24 12/14/2023 0435   GLUCOSE 112 (H) 12/14/2023 0435   BUN 15 12/14/2023 0435   BUN 14 11/29/2023 0933   CREATININE 1.17 12/14/2023 0435   CALCIUM  8.8 (L) 12/14/2023 0435   GFRNONAA >60 12/14/2023 0435   GFRAA 77 09/08/2019 1620    INR    Component Value Date/Time   INR 1.0 10/05/2023 2008     Intake/Output Summary (Last 24 hours) at 12/14/2023 0842 Last data filed at 12/14/2023 0458 Gross per 24 hour  Intake 2250 ml  Output 2195 ml  Net 55 ml     Assessment/Plan:  78 y.o. male is s/p R TCAR 1 Day Post-Op   No neuro events overnight R neck incision well appearing without hematoma Continue aspirin , plavix , statin Ambulating well; ok for discharge home.  Office will arrange  carotid duplex in 1 month   Donnice Sender, [place] [facility] [phone] 12/14/2023 8:42 AM

## 2023-12-14 NOTE — Plan of Care (Signed)
  Problem: Education: Goal: Knowledge of General Education information will improve Description: Including pain rating scale, medication(s)/side effects and non-pharmacologic comfort measures Outcome: Progressing   Problem: Health Behavior/Discharge Planning: Goal: Ability to manage health-related needs will improve Outcome: Progressing   Problem: Clinical Measurements: Goal: Ability to maintain clinical measurements within normal limits will improve Outcome: Progressing Goal: Will remain free from infection Outcome: Progressing Goal: Diagnostic test results will improve Outcome: Progressing Goal: Respiratory complications will improve Outcome: Progressing Goal: Cardiovascular complication will be avoided Outcome: Progressing   Problem: Activity: Goal: Risk for activity intolerance will decrease Outcome: Progressing   Problem: Nutrition: Goal: Adequate nutrition will be maintained Outcome: Progressing   Problem: Coping: Goal: Level of anxiety will decrease Outcome: Progressing   Problem: Elimination: Goal: Will not experience complications related to bowel motility Outcome: Progressing Goal: Will not experience complications related to urinary retention Outcome: Progressing   Problem: Pain Managment: Goal: General experience of comfort will improve and/or be controlled Outcome: Progressing   Problem: Safety: Goal: Ability to remain free from injury will improve Outcome: Progressing   Problem: Skin Integrity: Goal: Risk for impaired skin integrity will decrease Outcome: Progressing   Problem: Education: Goal: Knowledge of discharge needs will improve Outcome: Progressing   Problem: Clinical Measurements: Goal: Postoperative complications will be avoided or minimized Outcome: Progressing   Problem: Respiratory: Goal: Will achieve and/or maintain a regular respiratory rate, without signs or symptoms of dyspnea Outcome: Progressing   Problem: Skin  Integrity: Goal: Demonstration of wound healing without infection will improve Outcome: Progressing   Problem: Education: Goal: Knowledge of disease or condition will improve Outcome: Progressing Goal: Knowledge of secondary prevention will improve (MUST DOCUMENT ALL) Outcome: Progressing Goal: Knowledge of patient specific risk factors will improve (DELETE if not current risk factor) Outcome: Progressing   Problem: Ischemic Stroke/TIA Tissue Perfusion: Goal: Complications of ischemic stroke/TIA will be minimized Outcome: Progressing   Problem: Coping: Goal: Will verbalize positive feelings about self Outcome: Progressing Goal: Will identify appropriate support needs Outcome: Progressing   Problem: Health Behavior/Discharge Planning: Goal: Ability to manage health-related needs will improve Outcome: Progressing Goal: Goals will be collaboratively established with patient/family Outcome: Progressing   Problem: Self-Care: Goal: Ability to participate in self-care as condition permits will improve Outcome: Progressing Goal: Verbalization of feelings and concerns over difficulty with self-care will improve Outcome: Progressing Goal: Ability to communicate needs accurately will improve Outcome: Progressing   Problem: Nutrition: Goal: Risk of aspiration will decrease Outcome: Progressing Goal: Dietary intake will improve Outcome: Progressing

## 2023-12-14 NOTE — Discharge Instructions (Signed)
   [facility]  Discharge Instructions   Carotid Endarterectomy (CEA)  Please refer to the following instructions for your post-procedure care. Your surgeon or physician assistant will discuss any changes with you.  Activity  You are encouraged to walk as much as you can. You can slowly return to normal activities but must avoid strenuous activity and heavy lifting until your doctor tell you it's OK. Avoid activities such as vacuuming or swinging a golf club. You can drive after one week if you are comfortable and you are no longer taking prescription pain medications. It is normal to feel tired for serval weeks after your surgery. It is also normal to have difficulty with sleep habits, eating, and bowel movements after surgery. These will go away with time.  Bathing/Showering  You may shower after you come home. Do not soak in a bathtub, hot tub, or swim until the incision heals completely.  Incision Care  Shower every day. Clean your incision with mild soap and water. Pat the area dry with a clean towel. You do not need a bandage unless otherwise instructed. Do not apply any ointments or creams to your incision. You may have skin glue on your incision. Do not peel it off. It will come off on its own in about one week. Your incision may feel thickened and raised for several weeks after your surgery. This is normal and the skin will soften over time. For Men Only: It's OK to shave around the incision but do not shave the incision itself for 2 weeks. It is common to have numbness under your chin that could last for several months.  Diet  Resume your normal diet. There are no special food restrictions following this procedure. A low fat/low cholesterol diet is recommended for all patients with vascular disease. In order to heal from your surgery, it is CRITICAL to get adequate nutrition. Your body requires vitamins, minerals, and protein. Vegetables are the best  source of vitamins and minerals. Vegetables also provide the perfect balance of protein. Processed food has little nutritional value, so try to avoid this.        Medications  Resume taking all of your medications unless your doctor or physician assistant tells you not to. If your incision is causing pain, you may take over-the- counter pain relievers such as acetaminophen  (Tylenol ). If you were prescribed a stronger pain medication, please be aware these medications can cause nausea and constipation. Prevent nausea by taking the medication with a snack or meal. Avoid constipation by drinking plenty of fluids and eating foods with a high amount of fiber, such as fruits, vegetables, and grains. Do not take Tylenol  if you are taking prescription pain medications.  Follow Up  Our office will schedule a follow up appointment 2-3 weeks following discharge.  Please call us  immediately for any of the following conditions  Increased pain, redness, drainage (pus) from your incision site. Fever of 101 degrees or higher. If you should develop stroke (slurred speech, difficulty swallowing, weakness on one side of your body, loss of vision) you should call 911 and go to the nearest emergency room.  Reduce your risk of vascular disease:  Stop smoking. If you would like help call [facility] at 1-800-QUIT-NOW (986-078-2376) or  at [phone]. Manage your cholesterol Maintain a desired weight Control your diabetes Keep your blood pressure down  If you have any questions, please call the office at [phone].

## 2023-12-14 NOTE — Discharge Summary (Signed)
 Discharge Summary     Eric Cook 12/31/1945 78 y.o. male  [id number]  Admission Date: 12/13/2023  Discharge Date: 12/14/23  Physician: Dr. Lanis  Admission Diagnosis: Stenosis of left carotid artery [I65.22] Carotid stenosis, left [I65.22] Carotid artery stenosis [I65.29]  Discharge Day services:   See progress note 12/14/2023  Hospital Course:  [name] is a 78 year old male who was brought in as an outpatient and underwent right TCAR for high-grade asymptomatic stenosis of the right ICA by Dr. Lanis on 12/13/2023.  He previously underwent left TCAR for symptomatic stenosis in September of this year.  He tolerated the procedure well and was admitted to the hospital postoperatively.  His neuroexam remained at baseline and he did not experience any neurological events during hospital stay.  Postoperative day #1 right neck incision was well-appearing.  He is ambulating well and ready for discharge home.  He will continue his aspirin , Plavix , statin daily.  I prescribed a short dose of narcotic pain medication for ongoing pain control.  He will follow-up in the office in 1 month with a carotid duplex.  He was discharged home in stable condition with his son.   Recent Labs    12/14/23 0435  NA 138  K 2.9*  CL 104  CO2 24  GLUCOSE 112*  BUN 15  CALCIUM  8.8*   Recent Labs    12/14/23 0435  WBC 8.2  HGB 11.5*  HCT 31.1*  PLT 177   No results for input(s): INR in the last 72 hours.     Discharge Diagnosis:  Stenosis of left carotid artery [I65.22] Carotid stenosis, left [I65.22] Carotid artery stenosis [I65.29]  Secondary Diagnosis: Patient Active Problem List   Diagnosis Date Noted   Carotid stenosis, left 12/13/2023   Carotid artery stenosis 10/14/2023   Symptomatic carotid artery stenosis, left 10/14/2023   TIA (transient ischemic attack) 10/07/2023   Hypokalemia 08/04/2022   Dyslipidemia 08/04/2022   Acute on chronic diastolic CHF  (congestive heart failure) (HCC) 08/03/2022   COPD (chronic obstructive pulmonary disease) (HCC) 08/03/2022   Chronic congestive heart failure with left ventricular diastolic dysfunction (HCC) 08/03/2022   Partial symptomatic epilepsy with complex partial seizures, intractable, without status epilepticus (HCC) 12/01/2019   Urine frequency 11/09/2019   Diarrhea 11/09/2019   Closed compression fracture of L3 lumbar vertebra, initial encounter (HCC) 08/25/2019   Acute metabolic encephalopathy 08/24/2019   HTN (hypertension) 08/23/2019   BPH (benign prostatic hyperplasia) 08/23/2019   GAD (generalized anxiety disorder) 08/23/2019   Acute confusional state 08/23/2019   Past Medical History:  Diagnosis Date   Anxiety    Carotid artery occlusion    Chronic back pain    lumbar   COPD (chronic obstructive pulmonary disease) (HCC)    GERD (gastroesophageal reflux disease)    HLD (hyperlipidemia)    Hyperlipidemia    Hypertension    Peptic ulcer    Seizures (HCC)    medication related   Stroke [facility])     Allergies as of 12/14/2023   No Known Allergies      Medication List     TAKE these medications    acetaminophen  500 MG tablet Commonly known as: TYLENOL  Take 500 mg by mouth as needed for moderate pain or mild pain (As Needed).   aspirin  EC 81 MG tablet Take 1 tablet (81 mg total) by mouth daily. Swallow whole.   clopidogrel  75 MG tablet Commonly known as: PLAVIX  Take 1 tablet (75 mg total) by mouth daily.  levETIRAcetam  500 MG 24 hr tablet Commonly known as: KEPPRA  XR Take 1 tablet (500 mg total) by mouth at bedtime.   losartan  50 MG tablet Commonly known as: COZAAR  Take 1 tablet (50 mg total) by mouth daily. For blood pressure **dose change   metoprolol  tartrate 25 MG tablet Commonly known as: LOPRESSOR  TAKE 1 TABLET BY MOUTH TWICE A DAY   pantoprazole  40 MG tablet Commonly known as: Protonix  Take 1 tablet (40 mg total) by mouth daily.   rosuvastatin  20 MG  tablet Commonly known as: CRESTOR  TAKE 1 TABLET BY MOUTH EVERY DAY   sertraline  50 MG tablet Commonly known as: ZOLOFT  TAKE 1 TABLET BY MOUTH EVERY DAY   tamsulosin  0.4 MG Caps capsule Commonly known as: FLOMAX  Take 1 capsule (0.4 mg total) by mouth daily after supper.   traMADol  50 MG tablet Commonly known as: Ultram  Take 1 tablet (50 mg total) by mouth every 6 (six) hours as needed.   traZODone  50 MG tablet Commonly known as: DESYREL  Take 1 tablet (50 mg total) by mouth at bedtime as needed for sleep.         Discharge Instructions:   [facility] Discharge Instructions Carotid Endarterectomy (CEA)  Please refer to the following instructions for your post-procedure care. Your surgeon or physician assistant will discuss any changes with you.  Activity  You are encouraged to walk as much as you can. You can slowly return to normal activities but must avoid strenuous activity and heavy lifting until your doctor tell you it's OK. Avoid activities such as vacuuming or swinging a golf club. You can drive after one week if you are comfortable and you are no longer taking prescription pain medications. It is normal to feel tired for serval weeks after your surgery. It is also normal to have difficulty with sleep habits, eating, and bowel movements after surgery. These will go away with time.  Bathing/Showering  You may shower after you come home. Do not soak in a bathtub, hot tub, or swim until the incision heals completely.  Incision Care  Shower every day. Clean your incision with mild soap and water . Pat the area dry with a clean towel. You do not need a bandage unless otherwise instructed. Do not apply any ointments or creams to your incision. You may have skin glue on your incision. Do not peel it off. It will come off on its own in about one week. Your incision may feel thickened and raised for several weeks after your surgery. This is normal  and the skin will soften over time. For Men Only: It's OK to shave around the incision but do not shave the incision itself for 2 weeks. It is common to have numbness under your chin that could last for several months.  Diet  Resume your normal diet. There are no special food restrictions following this procedure. A low fat/low cholesterol diet is recommended for all patients with vascular disease. In order to heal from your surgery, it is CRITICAL to get adequate nutrition. Your body requires vitamins, minerals, and protein. Vegetables are the best source of vitamins and minerals. Vegetables also provide the perfect balance of protein. Processed food has little nutritional value, so try to avoid this.  Medications  Resume taking all of your medications unless your doctor or physician assistant tells you not to.  If your incision is causing pain, you may take over-the- counter pain relievers such as acetaminophen  (Tylenol ). If you were prescribed  a stronger pain medication, please be aware these medications can cause nausea and constipation.  Prevent nausea by taking the medication with a snack or meal. Avoid constipation by drinking plenty of fluids and eating foods with a high amount of fiber, such as fruits, vegetables, and grains. Do not take Tylenol  if you are taking prescription pain medications.  Follow Up  Our office will schedule a follow up appointment 2-3 weeks following discharge.  Please call us  immediately for any of the following conditions  Increased pain, redness, drainage (pus) from your incision site. Fever of 101 degrees or higher. If you should develop stroke (slurred speech, difficulty swallowing, weakness on one side of your body, loss of vision) you should call 911 and go to the nearest emergency room.  Reduce your risk of vascular disease:  Stop smoking. If you would like help call [facility] at 1-800-QUIT-NOW (838 436 0577) or [place] at (726)683-9699. Manage  your cholesterol Maintain a desired weight Control your diabetes Keep your blood pressure down  If you have any questions, please call the office at [phone].   Disposition: home  Patient's condition: is Good  Follow up: 1. [facility] in 4 weeks.   Donnice Sender, PA-C [facility] [phone]   --- For [facility] use ---   Modified Rankin score at D/C (0-6): 0  IV medication needed for:  1. Hypertension: No 2. Hypotension: No  Post-op Complications: No  1. Post-op CVA or TIA: No  If yes: Event classification (right eye, left eye, right cortical, left cortical, verterobasilar, other):   If yes: Timing of event (intra-op, <6 hrs post-op, >=6 hrs post-op, unknown):   2. CN injury: No  If yes: CN  injuried   3. Myocardial infarction: No  If yes: Dx by (EKG or clinical, Troponin):   4.  CHF: No  5.  Dysrhythmia (new): No  6. Wound infection: No  7. Reperfusion symptoms: No  8. Return to OR: No  If yes: return to OR for (bleeding, neurologic, other CEA incision, other):   Discharge medications: Statin use:  Yes ASA use:  Yes   Beta blocker use:  Yes ACE-Inhibitor use:  No  ARB use:  Yes CCB use: No P2Y12 Antagonist use: Yes, [ x] Plavix , [ ]  Plasugrel, [ ]  Ticlopinine, [ ]  Ticagrelor, [ ]  Other, [ ]  No for medical reason, [ ]  Non-compliant, [ ]  Not-indicated Anti-coagulant use:  No, [ ]  Warfarin, [ ]  Rivaroxaban, [ ]  Dabigatran,

## 2023-12-16 ENCOUNTER — Telehealth: Payer: Self-pay

## 2023-12-16 ENCOUNTER — Encounter (HOSPITAL_COMMUNITY): Payer: Self-pay | Admitting: Vascular Surgery

## 2023-12-16 LAB — POCT ACTIVATED CLOTTING TIME: Activated Clotting Time: 279 s

## 2023-12-16 NOTE — Transitions of Care (Post Inpatient/ED Visit) (Signed)
   12/16/2023  Name: Eric Cook MRN: [id number] DOB: 1945-10-29  Today's TOC FU Call Status: Today's TOC FU Call Status:: Successful TOC FU Call Completed TOC FU Call Complete Date: 12/16/23  Patient's Name and Date of Birth confirmed. Name, DOB  Transition Care Management Follow-up Telephone Call Date of Discharge: 12/14/23 Discharge Facility: [facility]) Type of Discharge: Inpatient Admission Primary Inpatient Discharge Diagnosis:: Carotid stenosis, left How have you been since you were released from the hospital?: Better Any questions or concerns?: No  Items Reviewed: Did you receive and understand the discharge instructions provided?: Yes Medications obtained,verified, and reconciled?: Yes (Medications Reviewed)  Medications Reviewed Today: verbally states that he has all his medications and is taking them as prescribed.  Medications Reviewed Today   Medications were not reviewed in this encounter    Placed call to patient and reviewed reason for call. Patient reports that he is doing well. Declines TOC review but states that he feels good, has his medications and his instruction, Denies any concerns today.  Alan Ee, RN, BSN, CEN [employer]- Transition of Care Team.  [facility] [phone]

## 2023-12-17 ENCOUNTER — Encounter (HOSPITAL_COMMUNITY): Payer: Self-pay | Admitting: Vascular Surgery

## 2023-12-18 ENCOUNTER — Other Ambulatory Visit: Payer: Self-pay

## 2023-12-18 DIAGNOSIS — I6521 Occlusion and stenosis of right carotid artery: Secondary | ICD-10-CM

## 2024-01-15 ENCOUNTER — Other Ambulatory Visit (HOSPITAL_COMMUNITY): Payer: Self-pay

## 2024-01-22 ENCOUNTER — Ambulatory Visit (INDEPENDENT_AMBULATORY_CARE_PROVIDER_SITE_OTHER): Admitting: Physician Assistant

## 2024-01-22 ENCOUNTER — Ambulatory Visit (HOSPITAL_COMMUNITY)
Admission: RE | Admit: 2024-01-22 | Discharge: 2024-01-22 | Disposition: A | Discharge status: Home / Self Care | Source: Ambulatory Visit | Attending: Vascular Surgery | Admitting: Vascular Surgery

## 2024-01-22 VITALS — BP 191/91 | HR 80 | Temp 98.5°F | Resp 20 | Ht 69.0 in | Wt 205.1 lb

## 2024-01-22 DIAGNOSIS — I6523 Occlusion and stenosis of bilateral carotid arteries: Secondary | ICD-10-CM

## 2024-01-22 DIAGNOSIS — I6521 Occlusion and stenosis of right carotid artery: Secondary | ICD-10-CM | POA: Diagnosis not present

## 2024-01-22 NOTE — Progress Notes (Signed)
" °  TCAR post operative follow up    CC:  follow up. Requesting Provider:  Jolinda Norene HERO, DO  HPI: This is a 78 y.o. male here for follow up for carotid artery stenosis.  Pt is s/p left TCAR for symptomatic carotid artery stenosis on 10/14/2023 by Dr. Lanis.  He subsequently underwent right TCAR on 12/13/2023 for critical ICA stenosis by Dr. Lanis.    Pt returns today for follow up.    Pt denies any amaurosis fugax, speech difficulties, weakness, numbness, paralysis or clumsiness or facial droop.    He states that his blood pressure runs in the 170's-180's at home.  He gets occasional right sided headaches that last about 10 minutes and then subsides.    The pt is on a statin for cholesterol management.  The pt is on a daily aspirin .   Other AC:  Plavix  The pt is  on BB, ARB for hypertension.   The pt is not on medication for diabetes Tobacco hx:  former   PHYSICAL EXAMINATION:  Today's Vitals   01/22/24 1246 01/22/24 1249  BP: (!) 189/88 (!) 191/91  Pulse: 80   Resp: 20   Temp: 98.5 F (36.9 C)   TempSrc: Temporal   SpO2: 95%   Weight: 205 lb 1.6 oz (93 kg)   Height: 5' 9 (1.753 m)    Body mass index is 30.29 kg/m.   General:  WDWN in NAD; vital signs documented above Gait: Not observed HENT: WNL, normocephalic Pulmonary: normal non-labored breathing Cardiac: regular HR, without carotid bruits Skin: without rashes Neurologic: A&O X 3; moving all extremities equally; speech is fluent/normal Psychiatric:  The pt has Normal affect.   Non-Invasive Vascular Imaging:   Carotid Duplex on 01/22/2024 Right:  patent stent Left:  patent stent (ECA occluded) Vertebrals:  Bilateral vertebral arteries demonstrate antegrade flow.  Subclavians: Normal flow hemodynamics were seen in bilateral subclavian  arteries.   Previous Carotid duplex on 11/14/2023: Right: 80-99% ICA stenosis Left:   1-39% ICA stenosis    ASSESSMENT/PLAN:: 77 y.o. male here for follow up  carotid artery stenosis and is s/p left TCAR for symptomatic carotid artery stenosis on 10/14/2023 by Dr. Lanis.  He subsequently underwent right TCAR on 12/13/2023 for critical ICA stenosis by Dr. Lanis.      -duplex today reveals patent stents bilaterally -discussed with Dr. Lanis and ok to stop Plavix  and continue with baby aspirin  daily.  Stressed to pt that he should not stop the aspirin .   His blood pressure is elevated today and he has appt with his PCP on 1/7.  I have asked him to take his blood pressure at home twice a day and write it down and take this with him to the PCP.  Also discussed with him to take his cuff with him to make sure it correlates with their readings.  -discussed s/s of stroke with pt and he understands should he develop any of these sx, he will go to the nearest ER or call 911. -pt will f/u in 9 months with carotid duplex and see Dr. Lanis since he did not get to see him today -pt will call sooner should he have any issues. -continue statin/asa .  Ok to discontinue Plavix    Apple Dearmas, [facility] [phone]  Clinic MD:  Sheree  "

## 2024-01-29 ENCOUNTER — Telehealth: Payer: Self-pay

## 2024-01-29 ENCOUNTER — Encounter: Payer: Self-pay | Admitting: Family Medicine

## 2024-01-29 ENCOUNTER — Telehealth: Payer: Self-pay | Admitting: Family Medicine

## 2024-01-29 ENCOUNTER — Ambulatory Visit (INDEPENDENT_AMBULATORY_CARE_PROVIDER_SITE_OTHER): Payer: Self-pay | Admitting: Family Medicine

## 2024-01-29 VITALS — BP 207/81 | HR 72 | Temp 97.8°F | Ht 69.0 in | Wt 205.5 lb

## 2024-01-29 DIAGNOSIS — Z9889 Other specified postprocedural states: Secondary | ICD-10-CM

## 2024-01-29 DIAGNOSIS — J019 Acute sinusitis, unspecified: Secondary | ICD-10-CM | POA: Diagnosis not present

## 2024-01-29 DIAGNOSIS — B9689 Other specified bacterial agents as the cause of diseases classified elsewhere: Secondary | ICD-10-CM | POA: Diagnosis not present

## 2024-01-29 DIAGNOSIS — I10 Essential (primary) hypertension: Secondary | ICD-10-CM

## 2024-01-29 DIAGNOSIS — I6523 Occlusion and stenosis of bilateral carotid arteries: Secondary | ICD-10-CM | POA: Diagnosis not present

## 2024-01-29 DIAGNOSIS — Z95828 Presence of other vascular implants and grafts: Secondary | ICD-10-CM

## 2024-01-29 MED ORDER — AMOXICILLIN-POT CLAVULANATE 875-125 MG PO TABS: 1.0000 | ORAL_TABLET | Freq: Two times a day (BID) | ORAL | 0 refills | Status: AC

## 2024-01-29 MED ORDER — LOSARTAN POTASSIUM 100 MG PO TABS: 100.0000 mg | ORAL_TABLET | Freq: Every day | ORAL | 3 refills | Status: AC

## 2024-01-29 NOTE — Telephone Encounter (Signed)
 Copied from CRM #[id number]. Topic: General - Other >> Jan 29, 2024 10:50 AM Wess RAMAN wrote: Reason for CRM: Patient wanted to let Luke know his blood pressure is high. He stated she told him to call her. He also refused transfer to the triage nurse. He only wants to speak to [facility].  BP: 187/97 Pulse: 66  Callback #: [phone]

## 2024-01-29 NOTE — Patient Instructions (Signed)
 Augmentin  sent for sinus infection Losartan  INCREASED to 100mg  daily.

## 2024-01-29 NOTE — Progress Notes (Signed)
 "  Subjective: CC: Follow-up surgery, hypertension PCP: Jolinda Norene HERO, DO YEP:Eric Cook is a 79 y.o. male presenting to clinic today for:  Blood pressure noted to be uncontrolled on 11/15/2023 visit.  His losartan  was advanced to 50 mg daily 8.  Since that visit he has been seen by vascular surgery and had other carotid artery stented.  He follow-up with them at the end of December and was stable except for blood pressure was persistently elevated.  He brought me his blood pressure readings and they have been ranging anywhere from 140s systolic to 190s systolic over 90s to 100s.  He does report that he has been having a headache over the last few days but notes that he has had what he thinks is maybe a sinus infection for the last several weeks.  He has been treating this with home remedies but nothing is helping.  He is had a couple of nosebleeds.  He reports no fevers, cough, congestion, blurred vision, balance issues etc.  No chest pain or shortness of breath.  No wheezing.   ROS: Per HPI  Allergies[1] Past Medical History:  Diagnosis Date   Anxiety    Carotid artery occlusion    Chronic back pain    lumbar   COPD (chronic obstructive pulmonary disease) (HCC)    GERD (gastroesophageal reflux disease)    HLD (hyperlipidemia)    Hyperlipidemia    Hypertension    Peptic ulcer    Seizures (HCC)    medication related   Stroke (HCC)    Current Medications[2] Social History   Socioeconomic History   Marital status: Single    Spouse name: Not on file   Number of children: 1   Years of education: Not on file   Highest education level: Some college, no degree  Occupational History   Occupation: lobbyist    Comment: part time  Tobacco Use   Smoking status: Former    Current packs/day: 0.00    Average packs/day: 1 pack/day for 54.0 years (54.0 ttl pk-yrs)    Types: Cigarettes    Start date: 04/1965    Quit date: 04/2019    Years since quitting: 4.7    Smokeless tobacco: Never  Vaping Use   Vaping status: Never Used  Substance and Sexual Activity   Alcohol use: Never   Drug use: Never   Sexual activity: Never  Other Topics Concern   Not on file  Social History Narrative   Lives alone   Right Handed   Drinks 2-3 cups of caffeine daily   Son in [place]   Social Drivers of Health   Tobacco Use: Medium Risk (01/22/2024)   Patient History    Smoking Tobacco Use: Former    Smokeless Tobacco Use: Never    Passive Exposure: Not on Actuary Strain: Low Risk (11/14/2023)   Overall Financial Resource Strain (CARDIA)    Difficulty of Paying Living Expenses: Not very hard  Food Insecurity: No Food Insecurity (12/13/2023)   Epic    Worried About Radiation Protection Practitioner of Food in the Last Year: Never true    Ran Out of Food in the Last Year: Never true  Transportation Needs: No Transportation Needs (12/13/2023)   Epic    Lack of Transportation (Medical): No    Lack of Transportation (Non-Medical): No  Physical Activity: Sufficiently Active (11/14/2023)   Exercise Vital Sign    Days of Exercise per Week: 6 days    Minutes of  Exercise per Session: 60 min  Stress: Stress Concern Present (11/14/2023)   [employer] of Occupational Health - Occupational Stress Questionnaire    Feeling of Stress: To some extent  Social Connections: Unknown (12/13/2023)   Social Connection and Isolation Panel    Frequency of Communication with Friends and Family: More than three times a week    Frequency of Social Gatherings with Friends and Family: Once a week    Attends Religious Services: Patient declined    Database Administrator or Organizations: Not on file    Attends Banker Meetings: Not on file    Marital Status: Divorced  Recent Concern: Social Connections - Socially Isolated (10/06/2023)   Social Connection and Isolation Panel    Frequency of Communication with Friends and Family: More than three times a week     Frequency of Social Gatherings with Friends and Family: Twice a week    Attends Religious Services: Patient declined    Active Member of Clubs or Organizations: No    Attends Engineer, Structural: Not on file    Marital Status: Divorced  Intimate Partner Violence: Not At Risk (12/13/2023)   Epic    Fear of Current or Ex-Partner: No    Emotionally Abused: No    Physically Abused: No    Sexually Abused: No  Depression (PHQ2-9): Low Risk (10/17/2023)   Depression (PHQ2-9)    PHQ-2 Score: 0  Alcohol Screen: Low Risk (06/24/2023)   Alcohol Screen    Last Alcohol Screening Score (AUDIT): 0  Housing: Low Risk (12/13/2023)   Epic    Unable to Pay for Housing in the Last Year: No    Number of Times Moved in the Last Year: 0    Homeless in the Last Year: No  Utilities: Not At Risk (12/13/2023)   Epic    Threatened with loss of utilities: No  Health Literacy: Adequate Health Literacy (06/24/2023)   B1300 Health Literacy    Frequency of need for help with medical instructions: Never   Family History  Problem Relation Age of Onset   Colon polyps Brother 47   Colon cancer Brother    Colon polyps Son 6   Other Son        vertigo   Kidney Stones Son    Esophageal cancer Neg Hx    Pancreatic cancer Neg Hx    Stomach cancer Neg Hx    Rectal cancer Neg Hx     Objective: Office vital signs reviewed. BP (!) 207/81   Pulse 72   Temp 97.8 F (36.6 C)   Ht 5' 9 (1.753 m)   Wt 205 lb 8 oz (93.2 kg)   SpO2 97%   BMI 30.35 kg/m   Physical Examination:  General: Awake, alert, well nourished, No acute distress HEENT: sclera white, MMM.  Healed postsurgical sites noted along the neck Cardio: regular rate and rhythm, S1S2 heard, no murmurs appreciated Pulm: clear to auscultation bilaterally, no wheezes, rhonchi or rales; normal work of breathing on room air MSK: Ambulating independently with normal gait and station  Assessment/ Plan: 79 y.o. male   Essential hypertension -  Plan: losartan  (COZAAR ) 100 MG tablet, Basic metabolic panel with GFR  Bilateral carotid artery stenosis  History of common carotid artery stent placement  Acute bacterial sinusitis - Plan: amoxicillin -clavulanate (AUGMENTIN ) 875-125 MG tablet   Blood pressure grossly uncontrolled at home and here in office.  He does have a slight headache but this has been  present presumably from the sinus infection.  I advanced his losartan  to 100 mg.  He will take his 50 mg x 2 today and report back to us  with what the blood pressure is in a couple of hours.  If he develops any red flag signs or symptoms he is to seek immediate medical attention in the ER.  Otherwise, anticipate repeat blood pressure with nurse in 2 weeks with repeat BMP given increase in ARB.  May consider adding calcium  channel blocker if unable to get blood pressure to goal with ARB alone  Augmentin  sent for bacterial sinusitis.   Norene CHRISTELLA Fielding, DO [facility] [phone]     [1] No Known Allergies [2]  Current Outpatient Medications:    acetaminophen  (TYLENOL ) 500 MG tablet, Take 500 mg by mouth as needed for moderate pain or mild pain (As Needed)., Disp: , Rfl:    aspirin  EC 81 MG tablet, Take 1 tablet (81 mg total) by mouth daily. Swallow whole., Disp: 30 tablet, Rfl: 2   clopidogrel  (PLAVIX ) 75 MG tablet, Take 1 tablet (75 mg total) by mouth daily., Disp: 30 tablet, Rfl: 2   levETIRAcetam  (KEPPRA  XR) 500 MG 24 hr tablet, Take 1 tablet (500 mg total) by mouth at bedtime., Disp: 90 tablet, Rfl: 3   losartan  (COZAAR ) 50 MG tablet, Take 1 tablet (50 mg total) by mouth daily. For blood pressure **dose change, Disp: 100 tablet, Rfl: 3   pantoprazole  (PROTONIX ) 40 MG tablet, Take 1 tablet (40 mg total) by mouth daily., Disp: 15 tablet, Rfl: 0   rosuvastatin  (CRESTOR ) 20 MG tablet, TAKE 1 TABLET BY MOUTH EVERY DAY, Disp: 90 tablet, Rfl: 0   sertraline  (ZOLOFT ) 50 MG tablet, TAKE 1 TABLET BY MOUTH EVERY  DAY, Disp: 90 tablet, Rfl: 2   tamsulosin  (FLOMAX ) 0.4 MG CAPS capsule, Take 1 capsule (0.4 mg total) by mouth daily after supper., Disp: 30 capsule, Rfl: 0   traZODone  (DESYREL ) 50 MG tablet, Take 1 tablet (50 mg total) by mouth at bedtime as needed for sleep., Disp: 90 tablet, Rfl: 0  "

## 2024-01-29 NOTE — Telephone Encounter (Signed)
 Returned patient call, advised that after talking to Dr. Jolinda it looks that his blood pressure is coming down slowly, since today is day 1 of doubling his blood pressure medication that reading is fine. Advised patient to keep monitoring his blood pressure and symptoms for the next week and to reach out if his blood pressure is still running high. Advised patient to call the office with any new symptoms.

## 2024-01-29 NOTE — Telephone Encounter (Signed)
 Returned patient call, patient stated that since leaving his appointment today he went home and took his medication like the Dr told him to do and his blood pressure 30 minutes later was 185/97. I advised patient to take his blood pressure a couple more times to see if it comes down anymore, I will reach out to patient later today to follow up on blood pressure.

## 2024-01-29 NOTE — Telephone Encounter (Signed)
 Copied from CRM #[id number]. Topic: Clinical - Refused Triage >> Jan 29, 2024 10:48 AM Wess RAMAN wrote: Patient/caller voiced complaints of high blood pressure. Declined transfer to triage.

## 2024-02-12 ENCOUNTER — Ambulatory Visit: Admitting: *Deleted

## 2024-02-12 ENCOUNTER — Other Ambulatory Visit: Payer: Self-pay | Admitting: Family Medicine

## 2024-02-12 ENCOUNTER — Other Ambulatory Visit

## 2024-02-12 VITALS — BP 159/73 | HR 68

## 2024-02-12 DIAGNOSIS — I10 Essential (primary) hypertension: Secondary | ICD-10-CM

## 2024-02-12 LAB — BASIC METABOLIC PANEL WITH GFR
BUN/Creatinine Ratio: 15 (ref 10–24)
BUN: 16 mg/dL (ref 8–27)
CO2: 21 mmol/L (ref 20–29)
Calcium: 9.7 mg/dL (ref 8.6–10.2)
Chloride: 103 mmol/L (ref 96–106)
Creatinine, Ser: 1.07 mg/dL (ref 0.76–1.27)
Glucose: 101 mg/dL — ABNORMAL HIGH (ref 70–99)
Potassium: 3.9 mmol/L (ref 3.5–5.2)
Sodium: 141 mmol/L (ref 134–144)
eGFR: 71 mL/min/1.73

## 2024-02-12 MED ORDER — AMLODIPINE BESYLATE 5 MG PO TABS: 5.0000 mg | ORAL_TABLET | Freq: Every day | ORAL | 3 refills | Status: AC

## 2024-02-12 NOTE — Progress Notes (Signed)
 Patient is in office today for a nurse visit for Blood Pressure Check. Patient blood pressure was 161/77 p 88 O2 97, Patient is having no sxs. #nd BP check ws 159/73 p 68

## 2024-02-12 NOTE — Progress Notes (Signed)
 Please advise patient blood pressure remains uncontrolled with him adding amlodipine  5 mg daily for him to take with his losartan  100 mg daily.  Please ensure that he is taking both of these medications correctly and that he has been on the higher dose of losartan  (100mg ) as ordered at the beginning of the month.  Please also make sure that he has an appointment with me in 1 month for blood pressure follow-up  Meds ordered this encounter  Medications   amLODipine  (NORVASC ) 5 MG tablet    Sig: Take 1 tablet (5 mg total) by mouth daily. Take WITH losartan  100mg  daily.    Dispense:  90 tablet    Refill:  3

## 2024-02-13 ENCOUNTER — Telehealth: Payer: Self-pay

## 2024-02-13 NOTE — Progress Notes (Signed)
 Patient stated that he started taking his amlodipine  today 02/13/2024 with his losartan . One month follow up appointment scheduled.

## 2024-02-13 NOTE — Telephone Encounter (Signed)
 Patient stated that he started taking his amlodipine  today 02/13/2024 with his losartan . One month follow up appointment scheduled.

## 2024-02-14 ENCOUNTER — Ambulatory Visit: Payer: Self-pay | Admitting: Family Medicine

## 2024-02-25 ENCOUNTER — Other Ambulatory Visit: Payer: Self-pay | Admitting: Family Medicine

## 2024-02-25 DIAGNOSIS — F5104 Psychophysiologic insomnia: Secondary | ICD-10-CM

## 2024-03-17 ENCOUNTER — Ambulatory Visit: Admitting: Pulmonary Disease

## 2024-03-18 ENCOUNTER — Ambulatory Visit

## 2024-04-22 ENCOUNTER — Encounter: Payer: Self-pay | Admitting: Family Medicine

## 2024-06-26 ENCOUNTER — Encounter

## 2024-11-05 ENCOUNTER — Ambulatory Visit: Admitting: Adult Health
